# Patient Record
Sex: Female | Born: 1955
Health system: Southern US, Community
[De-identification: ages and names within clinical notes are randomized; demographics above are authoritative.]

## PROBLEM LIST (undated history)

## (undated) DIAGNOSIS — J189 Pneumonia, unspecified organism: Secondary | ICD-10-CM

## (undated) DIAGNOSIS — R0789 Other chest pain: Secondary | ICD-10-CM

## (undated) DIAGNOSIS — K30 Functional dyspepsia: Secondary | ICD-10-CM

## (undated) DIAGNOSIS — D899 Disorder involving the immune mechanism, unspecified: Secondary | ICD-10-CM

## (undated) DIAGNOSIS — K5190 Ulcerative colitis, unspecified, without complications: Secondary | ICD-10-CM

## (undated) DIAGNOSIS — R0600 Dyspnea, unspecified: Secondary | ICD-10-CM

## (undated) DIAGNOSIS — G43909 Migraine, unspecified, not intractable, without status migrainosus: Secondary | ICD-10-CM

## (undated) DIAGNOSIS — I209 Angina pectoris, unspecified: Secondary | ICD-10-CM

## (undated) DIAGNOSIS — M1990 Unspecified osteoarthritis, unspecified site: Secondary | ICD-10-CM

## (undated) DIAGNOSIS — R7303 Prediabetes: Secondary | ICD-10-CM

## (undated) DIAGNOSIS — R0609 Other forms of dyspnea: Secondary | ICD-10-CM

## (undated) DIAGNOSIS — D649 Anemia, unspecified: Secondary | ICD-10-CM

## (undated) DIAGNOSIS — K279 Peptic ulcer, site unspecified, unspecified as acute or chronic, without hemorrhage or perforation: Secondary | ICD-10-CM

## (undated) DIAGNOSIS — E7800 Pure hypercholesterolemia, unspecified: Secondary | ICD-10-CM

## (undated) DIAGNOSIS — M8588 Other specified disorders of bone density and structure, other site: Secondary | ICD-10-CM

## (undated) DIAGNOSIS — C4490 Unspecified malignant neoplasm of skin, unspecified: Secondary | ICD-10-CM

## (undated) DIAGNOSIS — I10 Essential (primary) hypertension: Secondary | ICD-10-CM

## (undated) DIAGNOSIS — K621 Rectal polyp: Secondary | ICD-10-CM

## (undated) HISTORY — DX: Other specified disorders of bone density and structure, other site: M85.88

## (undated) HISTORY — DX: Disorder involving the immune mechanism, unspecified: D89.9

## (undated) HISTORY — DX: Unspecified osteoarthritis, unspecified site: M19.90

## (undated) HISTORY — DX: Dyspnea, unspecified: R06.00

## (undated) HISTORY — PX: ESOPHAGOGASTRODUODENOSCOPY: SHX1529

## (undated) HISTORY — DX: Other chest pain: R07.89

## (undated) HISTORY — PX: CARDIAC CATHETERIZATION: SHX172

## (undated) HISTORY — DX: Pneumonia, unspecified organism: J18.9

## (undated) HISTORY — PX: COLONOSCOPY: SHX174

## (undated) HISTORY — DX: Functional dyspepsia: K30

## (undated) HISTORY — DX: Other forms of dyspnea: R06.09

## (undated) HISTORY — PX: OTHER SURGICAL HISTORY: SHX169

## (undated) HISTORY — PX: ANKLE FRACTURE SURGERY: SHX122

## (undated) HISTORY — PX: TUBAL LIGATION: SHX77

## (undated) HISTORY — DX: Peptic ulcer, site unspecified, unspecified as acute or chronic, without hemorrhage or perforation: K27.9

---

## 2008-10-26 ENCOUNTER — Emergency Department (HOSPITAL_BASED_OUTPATIENT_CLINIC_OR_DEPARTMENT_OTHER): Admission: EM | Admit: 2008-10-26 | Discharge: 2008-10-26 | Payer: Self-pay | Admitting: Emergency Medicine

## 2008-10-26 ENCOUNTER — Ambulatory Visit: Payer: Self-pay | Admitting: Radiology

## 2008-10-29 ENCOUNTER — Encounter (HOSPITAL_COMMUNITY): Admission: RE | Admit: 2008-10-29 | Discharge: 2009-01-27 | Payer: Self-pay | Admitting: Emergency Medicine

## 2008-12-11 ENCOUNTER — Emergency Department (HOSPITAL_BASED_OUTPATIENT_CLINIC_OR_DEPARTMENT_OTHER): Admission: EM | Admit: 2008-12-11 | Discharge: 2008-12-11 | Payer: Self-pay | Admitting: Emergency Medicine

## 2009-09-15 ENCOUNTER — Emergency Department (HOSPITAL_BASED_OUTPATIENT_CLINIC_OR_DEPARTMENT_OTHER): Admission: EM | Admit: 2009-09-15 | Discharge: 2009-09-15 | Payer: Self-pay | Admitting: Emergency Medicine

## 2009-09-15 ENCOUNTER — Ambulatory Visit: Payer: Self-pay | Admitting: Diagnostic Radiology

## 2010-10-11 ENCOUNTER — Encounter: Payer: Self-pay | Admitting: Family Medicine

## 2011-01-01 LAB — DIFFERENTIAL
Basophils Absolute: 0 10*3/uL (ref 0.0–0.1)
Basophils Relative: 0 % (ref 0–1)
Eosinophils Absolute: 0 10*3/uL (ref 0.0–0.7)
Eosinophils Relative: 0 % (ref 0–5)
Lymphocytes Relative: 9 % — ABNORMAL LOW (ref 12–46)
Lymphs Abs: 0.7 10*3/uL (ref 0.7–4.0)
Monocytes Absolute: 0.5 10*3/uL (ref 0.1–1.0)
Monocytes Relative: 6 % (ref 3–12)
Neutro Abs: 7.1 10*3/uL (ref 1.7–7.7)
Neutrophils Relative %: 85 % — ABNORMAL HIGH (ref 43–77)

## 2011-01-01 LAB — CBC
HCT: 37.3 % (ref 36.0–46.0)
Hemoglobin: 12.4 g/dL (ref 12.0–15.0)
MCHC: 33.3 g/dL (ref 30.0–36.0)
MCV: 87.2 fL (ref 78.0–100.0)
Platelets: 250 10*3/uL (ref 150–400)
RBC: 4.28 MIL/uL (ref 3.87–5.11)
RDW: 13.3 % (ref 11.5–15.5)
WBC: 8.3 10*3/uL (ref 4.0–10.5)

## 2011-01-01 LAB — URINALYSIS, ROUTINE W REFLEX MICROSCOPIC
Bilirubin Urine: NEGATIVE
Glucose, UA: NEGATIVE mg/dL
Hgb urine dipstick: NEGATIVE
Ketones, ur: 15 mg/dL — AB
Nitrite: NEGATIVE
Protein, ur: NEGATIVE mg/dL
Specific Gravity, Urine: 1.013 (ref 1.005–1.030)
Urobilinogen, UA: 0.2 mg/dL (ref 0.0–1.0)
pH: 7.5 (ref 5.0–8.0)

## 2011-01-01 LAB — BASIC METABOLIC PANEL
BUN: 11 mg/dL (ref 6–23)
CO2: 28 mEq/L (ref 19–32)
Calcium: 9.4 mg/dL (ref 8.4–10.5)
Chloride: 107 mEq/L (ref 96–112)
Creatinine, Ser: 0.7 mg/dL (ref 0.4–1.2)
GFR calc Af Amer: >60 mL/min (ref >60)
GFR calc non Af Amer: >60 mL/min (ref >60)
Glucose, Bld: 101 mg/dL — ABNORMAL HIGH (ref 70–99)
Potassium: 3.2 mEq/L — ABNORMAL LOW (ref 3.5–5.1)
Sodium: 145 mEq/L (ref 135–145)

## 2016-05-24 ENCOUNTER — Encounter (HOSPITAL_BASED_OUTPATIENT_CLINIC_OR_DEPARTMENT_OTHER): Payer: Self-pay | Admitting: Emergency Medicine

## 2016-05-24 ENCOUNTER — Emergency Department (HOSPITAL_BASED_OUTPATIENT_CLINIC_OR_DEPARTMENT_OTHER)
Admission: EM | Admit: 2016-05-24 | Discharge: 2016-05-24 | Disposition: A | Discharge status: Home / Self Care | Payer: 59 | Attending: Emergency Medicine | Admitting: Emergency Medicine

## 2016-05-24 DIAGNOSIS — H53149 Visual discomfort, unspecified: Secondary | ICD-10-CM | POA: Diagnosis not present

## 2016-05-24 DIAGNOSIS — R112 Nausea with vomiting, unspecified: Secondary | ICD-10-CM | POA: Insufficient documentation

## 2016-05-24 DIAGNOSIS — R519 Headache, unspecified: Secondary | ICD-10-CM

## 2016-05-24 DIAGNOSIS — R51 Headache: Secondary | ICD-10-CM | POA: Diagnosis present

## 2016-05-24 DIAGNOSIS — I10 Essential (primary) hypertension: Secondary | ICD-10-CM | POA: Diagnosis not present

## 2016-05-24 HISTORY — DX: Essential (primary) hypertension: I10

## 2016-05-24 HISTORY — DX: Ulcerative colitis, unspecified, without complications: K51.90

## 2016-05-24 HISTORY — DX: Pure hypercholesterolemia, unspecified: E78.00

## 2016-05-24 HISTORY — DX: Migraine, unspecified, not intractable, without status migrainosus: G43.909

## 2016-05-24 LAB — BASIC METABOLIC PANEL
Anion gap: 8 (ref 5–15)
BUN: 16 mg/dL (ref 6–20)
CO2: 21 mmol/L — ABNORMAL LOW (ref 22–32)
Calcium: 9.2 mg/dL (ref 8.9–10.3)
Chloride: 108 mmol/L (ref 101–111)
Creatinine, Ser: 0.77 mg/dL (ref 0.44–1.00)
GFR calc Af Amer: >60 mL/min (ref >60)
GFR calc non Af Amer: >60 mL/min (ref >60)
Glucose, Bld: 114 mg/dL — ABNORMAL HIGH (ref 65–99)
Potassium: 3.6 mmol/L (ref 3.5–5.1)
Sodium: 137 mmol/L (ref 135–145)

## 2016-05-24 LAB — CBC WITH DIFFERENTIAL/PLATELET
Basophils Absolute: 0 10*3/uL (ref 0.0–0.1)
Basophils Relative: 0 %
Eosinophils Absolute: 0 10*3/uL (ref 0.0–0.7)
Eosinophils Relative: 0 %
HCT: 34.3 % — ABNORMAL LOW (ref 36.0–46.0)
Hemoglobin: 11.8 g/dL — ABNORMAL LOW (ref 12.0–15.0)
Lymphocytes Relative: 17 %
Lymphs Abs: 1.7 10*3/uL (ref 0.7–4.0)
MCH: 29 pg (ref 26.0–34.0)
MCHC: 34.4 g/dL (ref 30.0–36.0)
MCV: 84.3 fL (ref 78.0–100.0)
Monocytes Absolute: 0.7 10*3/uL (ref 0.1–1.0)
Monocytes Relative: 7 %
Neutro Abs: 7.7 10*3/uL (ref 1.7–7.7)
Neutrophils Relative %: 76 %
Platelets: 249 10*3/uL (ref 150–400)
RBC: 4.07 MIL/uL (ref 3.87–5.11)
RDW: 13.6 % (ref 11.5–15.5)
WBC: 10.2 10*3/uL (ref 4.0–10.5)

## 2016-05-24 MED ORDER — DIPHENHYDRAMINE HCL 50 MG/ML IJ SOLN
12.5000 mg | Freq: Once | INTRAMUSCULAR | Status: AC
  Administered 2016-05-24: 12.5 mg via INTRAVENOUS
  Filled 2016-05-24: qty 1

## 2016-05-24 MED ORDER — METOCLOPRAMIDE HCL 5 MG/ML IJ SOLN
10.0000 mg | Freq: Once | INTRAMUSCULAR | Status: AC
  Administered 2016-05-24: 10 mg via INTRAVENOUS
  Filled 2016-05-24: qty 2

## 2016-05-24 MED ORDER — KETOROLAC TROMETHAMINE 30 MG/ML IJ SOLN
30.0000 mg | Freq: Once | INTRAMUSCULAR | Status: AC
  Administered 2016-05-24: 30 mg via INTRAVENOUS
  Filled 2016-05-24: qty 1

## 2016-05-24 MED ORDER — SODIUM CHLORIDE 0.9 % IV BOLUS (SEPSIS)
1000.0000 mL | Freq: Once | INTRAVENOUS | Status: AC
  Administered 2016-05-24: 1000 mL via INTRAVENOUS

## 2016-05-24 MED ORDER — DEXAMETHASONE SODIUM PHOSPHATE 10 MG/ML IJ SOLN
10.0000 mg | Freq: Once | INTRAMUSCULAR | Status: AC
  Administered 2016-05-24: 10 mg via INTRAVENOUS
  Filled 2016-05-24: qty 1

## 2016-05-24 NOTE — ED Triage Notes (Signed)
Patient states that she is having 10/10 pain with typical Migraine Headache pains

## 2016-05-24 NOTE — ED Provider Notes (Signed)
[facility] Provider Note   CSN: [id number] Arrival date & time: 05/24/16  1810  By signing my name below, I, Andrea [name], attest that this documentation has been prepared under the direction and in the presence of Gloriann Loan, PA-C. Electronically Signed: Irene [name], ED Scribe. 05/24/16. 7:02 PM.  History   Chief Complaint Chief Complaint  Patient presents with  . Migraine   The history is provided by the patient. No language interpreter was used.   HPI Comments: Andrea [name] is a 60 y.o. Female with a hx of migraine and HTN who presents to the Emergency Department complaining of a gradually worsening frontal and left temporal headache that she rates 10/10 onset earlier this evening, about 4-5 hours ago. She reports associated photophobia, phonophobia, nausea, and vomiting x1. Pt states that her current pain is typical of her migraines. Pt took 2 Tylenol for her symptoms with no relief. She denies fever, cough, neck pain, neck stiffness, gait abnormalities, dizziness, facial droop, lightheadedness, numbness, speech difficulty, or weakness. No head injury/trauma.   Past Medical History:  Diagnosis Date  . Hypercholesterolemia   . Hypertension   . Migraine   . Ulcerative colitis ([place])     There are no active problems to display for this patient.   Past Surgical History:  Procedure Laterality Date  . hip replacement      OB History    No data available     Home Medications    Prior to Admission medications   Not on File    Family History History reviewed. No pertinent family history.  Social History Social History  Substance Use Topics  . Smoking status: Never Smoker  . Smokeless tobacco: Never Used  . Alcohol use No     Allergies   Codeine  Review of Systems Review of Systems  Constitutional: Negative for fever.  Eyes: Positive for photophobia.  Respiratory: Negative for cough.   Gastrointestinal: Positive for nausea and vomiting.    Musculoskeletal: Negative for gait problem, neck pain and neck stiffness.  Neurological: Positive for headaches. Negative for dizziness, facial asymmetry, speech difficulty, weakness, light-headedness and numbness.   Physical Exam Updated Vital Signs BP 149/67 (BP Location: Right Arm)   Pulse 84   Temp 98 F (36.7 C) (Oral)   Resp 18   Ht 5\' 5"  (1.651 m)   Wt 83.5 kg   SpO2 98%   BMI 30.62 kg/m   Physical Exam  Constitutional: She is oriented to person, place, and time. She appears well-developed and well-nourished.  Non-toxic appearance. She does not have a sickly appearance. She does not appear ill.  HENT:  Head: Normocephalic and atraumatic.  Mouth/Throat: Oropharynx is clear and moist.  Eyes: Conjunctivae are normal.  Neck: Normal range of motion. Neck supple.  No nuchal rigidity or meningismus  Cardiovascular: Normal rate and regular rhythm.   Pulmonary/Chest: Effort normal and breath sounds normal. No accessory muscle usage or stridor. No respiratory distress. She has no wheezes. She has no rhonchi. She has no rales.  Abdominal: Soft. Bowel sounds are normal. She exhibits no distension. There is no tenderness.  Musculoskeletal: Normal range of motion.  Lymphadenopathy:    She has no cervical adenopathy.  Neurological: She is alert and oriented to person, place, and time.  Mental Status:   AOx3.  Speech clear without dysarthria. Cranial Nerves:  I-not tested  II-PERRLA  III, IV, VI-EOMs intact  V-temporal and masseter strength intact  VII-symmetrical facial movements intact, no facial droop  VIII-hearing grossly intact bilaterally  IX, X-gag intact  XI-strength of sternomastoid and trapezius muscles 5/5  XII-tongue midline Motor:   Good muscle bulk and tone  Strength 5/5 bilaterally in upper and lower extremities   Cerebellar--intact RAMs, finger to nose intact bilaterally.  Gait normal  No pronator drift Sensory:  Intact in upper and lower extremities    Skin: Skin is warm and dry.  Psychiatric: She has a normal mood and affect. Her behavior is normal.     ED Treatments / Results  DIAGNOSTIC STUDIES: Oxygen Saturation is 100% on RA, normal by my interpretation.    COORDINATION OF CARE: 7:00 PM-Discussed treatment plan which includes labs, IV fluids, and migraine cocktail with pt at bedside and pt agreed to plan.    Labs (all labs ordered are listed, but only abnormal results are displayed) Labs Reviewed  CBC WITH DIFFERENTIAL/PLATELET - Abnormal; Notable for the following:       Result Value   Hemoglobin 11.8 (*)    HCT 34.3 (*)    All other components within normal limits  BASIC METABOLIC PANEL - Abnormal; Notable for the following:    CO2 21 (*)    Glucose, Bld 114 (*)    All other components within normal limits    EKG  EKG Interpretation None       Radiology No results found.  Procedures Procedures (including critical care time)  Medications Ordered in ED Medications  sodium chloride 0.9 % bolus 1,000 mL (0 mLs Intravenous Stopped 05/24/16 2042)  ketorolac (TORADOL) 30 MG/ML injection 30 mg (30 mg Intravenous Given 05/24/16 1943)  metoCLOPramide (REGLAN) injection 10 mg (10 mg Intravenous Given 05/24/16 1943)  dexamethasone (DECADRON) injection 10 mg (10 mg Intravenous Given 05/24/16 1943)  diphenhydrAMINE (BENADRYL) injection 12.5 mg (12.5 mg Intravenous Given 05/24/16 1944)     Initial Impression / Assessment and Plan / ED Course  I have reviewed the triage vital signs and the nursing notes.  Pertinent labs & imaging results that were available during my care of the patient were reviewed by me and considered in my medical decision making (see chart for details).  Clinical Course   Typical migraine headache for the pt. Non focal neuro exam. No recent head trauma. No fever. Doubt meningitis. Doubt intracranial bleed. Doubt normal pressure hydrocephalus. No indication for imaging. Will treat with migraine cocktail  and reevaluate.  Patient with improvement.  Labs without acute abnormalities. Follow up PCP.  Stable for discharge.   I personally performed the services described in this documentation, which was scribed in my presence. The recorded information has been reviewed and is accurate.    Final Clinical Impressions(s) / ED Diagnoses   Final diagnoses:  Nonintractable headache, unspecified chronicity pattern, unspecified headache type     New Prescriptions New Prescriptions   No medications on file     Gloriann Loan, PA-C 05/24/16 2056    Sherwood Gambler, MD 06/02/16 [phone]

## 2017-10-25 DIAGNOSIS — K5190 Ulcerative colitis, unspecified, without complications: Secondary | ICD-10-CM | POA: Diagnosis not present

## 2017-11-26 ENCOUNTER — Encounter (HOSPITAL_COMMUNITY): Payer: Self-pay | Admitting: Emergency Medicine

## 2017-11-26 ENCOUNTER — Other Ambulatory Visit: Payer: Self-pay

## 2017-11-26 ENCOUNTER — Ambulatory Visit (HOSPITAL_COMMUNITY)
Admission: EM | Admit: 2017-11-26 | Discharge: 2017-11-26 | Disposition: A | Discharge status: Home / Self Care | Payer: 59 | Attending: Internal Medicine | Admitting: Internal Medicine

## 2017-11-26 DIAGNOSIS — J0110 Acute frontal sinusitis, unspecified: Secondary | ICD-10-CM | POA: Diagnosis not present

## 2017-11-26 MED ORDER — AMOXICILLIN-POT CLAVULANATE 875-125 MG PO TABS: 1.0000 | ORAL_TABLET | Freq: Two times a day (BID) | ORAL | 0 refills | Status: DC

## 2017-11-26 MED ORDER — FLUTICASONE PROPIONATE 50 MCG/ACT NA SUSP: 2.0000 | Freq: Every day | NASAL | 0 refills | Status: DC

## 2017-11-26 MED ORDER — IPRATROPIUM BROMIDE 0.06 % NA SOLN: 2.0000 | Freq: Four times a day (QID) | NASAL | 0 refills | Status: DC

## 2017-11-26 MED FILL — IPRATROPIUM 0.06% SPRAY: 0.06 | 10 days supply | Qty: 15 | Fill #0

## 2017-11-26 MED FILL — FLUTICASONE PROP 50 MCG SPR: 50 | 30 days supply | Qty: 16 | Fill #0

## 2017-11-26 NOTE — Discharge Instructions (Signed)
Start flonase, atrovent nasal spray for nasal congestion/drainage. You can use over the counter nasal saline rinse such as neti pot for nasal congestion. Keep hydrated, your urine should be clear to pale yellow in color. Tylenol/motrin for fever and pain. Monitor for any worsening of symptoms, chest pain, shortness of breath, wheezing, swelling of the throat, follow up for reevaluation.   If symptoms persists, can fill Augmentin on 3/12 for bacterial sinus infection.  For sore throat try using a honey-based tea. Use 3 teaspoons of honey with juice squeezed from half lemon. Place shaved pieces of ginger into 1/2-1 cup of water and warm over stove top. Then mix the ingredients and repeat every 4 hours as needed.

## 2017-11-26 NOTE — ED Provider Notes (Signed)
[facility]    CSN: [id number] Arrival date & time: 11/26/17  1408     History   Chief Complaint Chief Complaint  Patient presents with  . Sore Throat    HPI Andrea [name] is a 62 y.o. female.   62 year old female with history of HLD, HTN, UC comes in for 1 week history of URI symptoms.  States  has had sore throat, rhinorrhea.  Minimal cough.  Denies fever, chills, night sweats.  OTC cough drops with some relief.  Never smoker.  Positive sick contact.      Past Medical History:  Diagnosis Date  . Hypercholesterolemia   . Hypertension   . Migraine   . Ulcerative colitis ([place])     There are no active problems to display for this patient.   Past Surgical History:  Procedure Laterality Date  . hip replacement      OB History    No data available       Home Medications    Prior to Admission medications   Medication Sig Start Date End Date Taking? Authorizing Provider  atorvastatin (LIPITOR) 20 MG tablet Take 20 mg by mouth daily.   Yes [provider]  azaTHIOprine (IMURAN) 50 MG tablet Take 50 mg by mouth daily.   Yes [provider]  inFLIXimab (REMICADE) 100 MG injection Inject into the vein.   Yes [provider]  PARoxetine (PAXIL) 10 MG tablet Take 5 mg by mouth daily.   Yes [provider]  amoxicillin-clavulanate (AUGMENTIN) 875-125 MG tablet Take 1 tablet by mouth every 12 (twelve) hours. Can fill on 3/12 if symptoms not improving 11/30/17   Tasia Catchings, Amy V, PA-C  fluticasone (FLONASE) 50 MCG/ACT nasal spray Place 2 sprays into both nostrils daily. 11/26/17   Tasia Catchings, Amy V, PA-C  ipratropium (ATROVENT) 0.06 % nasal spray Place 2 sprays into both nostrils 4 (four) times daily. 11/26/17   Ok Edwards, PA-C    Family History History reviewed. No pertinent family history.  Social History Social History   Tobacco Use  . Smoking status: Never Smoker  . Smokeless tobacco: Never Used  Substance Use Topics  . Alcohol use:  No  . Drug use: No     Allergies   Codeine   Review of Systems Review of Systems  Reason unable to perform ROS: See HPI as above.     Physical Exam Triage Vital Signs ED Triage Vitals  Enc Vitals Group     BP 11/26/17 1445 (!) 155/80     Pulse Rate 11/26/17 1445 77     Resp 11/26/17 1445 18     Temp 11/26/17 1445 98.9 F (37.2 C)     Temp Source 11/26/17 1445 Oral     SpO2 11/26/17 1445 99 %     Weight --      Height --      Head Circumference --      Peak Flow --      Pain Score 11/26/17 1441 4     Pain Loc --      Pain Edu? --      Excl. in [place]? --    No data found.  Updated Vital Signs BP (!) 155/80 (BP Location: Left Arm)   Pulse 77   Temp 98.9 F (37.2 C) (Oral)   Resp 18   SpO2 99%   Physical Exam  Constitutional: She is oriented to person, place, and time. She appears well-developed and well-nourished. No distress.  HENT:  Head: Normocephalic and atraumatic.  Right Ear: Tympanic membrane, external ear and ear canal normal. Tympanic membrane is not erythematous and not bulging.  Left Ear: Tympanic membrane, external ear and ear canal normal. Tympanic membrane is not erythematous and not bulging.  Nose: Right sinus exhibits frontal sinus tenderness. Right sinus exhibits no maxillary sinus tenderness. Left sinus exhibits frontal sinus tenderness. Left sinus exhibits no maxillary sinus tenderness.  Mouth/Throat: Uvula is midline, oropharynx is clear and moist and mucous membranes are normal. No tonsillar exudate.  Eyes: Conjunctivae are normal. Pupils are equal, round, and reactive to light.  Neck: Normal range of motion. Neck supple.  Cardiovascular: Normal rate, regular rhythm and normal heart sounds. Exam reveals no gallop and no friction rub.  No murmur heard. Pulmonary/Chest: Effort normal and breath sounds normal. She has no decreased breath sounds. She has no wheezes. She has no rhonchi. She has no rales.  Lymphadenopathy:    She has no cervical  adenopathy.  Neurological: She is alert and oriented to person, place, and time.  Skin: Skin is warm and dry.  Psychiatric: She has a normal mood and affect. Her behavior is normal. Judgment normal.     UC Treatments / Results  Labs (all labs ordered are listed, but only abnormal results are displayed) Labs Reviewed - No data to display  EKG  EKG Interpretation None       Radiology No results found.  Procedures Procedures (including critical care time)  Medications Ordered in UC Medications - No data to display   Initial Impression / Assessment and Plan / UC Course  I have reviewed the triage vital signs and the nursing notes.  Pertinent labs & imaging results that were available during my care of the patient were reviewed by me and considered in my medical decision making (see chart for details).    Discussed with patient history and exam most consistent with viral URI. Symptomatic treatment as needed. Push fluids.  Rx of Augmentin sent to pharmacy, patient can fill in 3 days if symptoms not improving for sinusitis. Return precautions given.    Final Clinical Impressions(s) / UC Diagnoses   Final diagnoses:  Acute non-recurrent frontal sinusitis    ED Discharge Orders        Ordered    amoxicillin-clavulanate (AUGMENTIN) 875-125 MG tablet  Every 12 hours     11/26/17 1535    fluticasone (FLONASE) 50 MCG/ACT nasal spray  Daily     11/26/17 1535    ipratropium (ATROVENT) 0.06 % nasal spray  4 times daily     11/26/17 [address], [place] 11/26/17 1538

## 2017-11-26 NOTE — ED Triage Notes (Signed)
The patient presented to the [facility] with a complaint of a sore throat x 1 week. The patient denied any fever in the lat 48 hours.

## 2017-12-03 MED FILL — AMOXICILLIN-POT CLAVULANATE: 875-125 | 7 days supply | Qty: 14 | Fill #0

## 2017-12-20 DIAGNOSIS — K5190 Ulcerative colitis, unspecified, without complications: Secondary | ICD-10-CM | POA: Diagnosis not present

## 2017-12-21 MED FILL — ATORVASTATIN 10 MG TABLET: 10 | 90 days supply | Qty: 90 | Fill #0

## 2017-12-21 MED FILL — azaTHIOprine 50 MG TABS: 50 | 90 days supply | Qty: 180 | Fill #0

## 2017-12-21 MED FILL — TRIAMTERENE/HCTZ 37.5/25 CP: 37.5-25 | 90 days supply | Qty: 90 | Fill #0

## 2017-12-27 MED FILL — SF 5000 PLUS CREAM: 1.1 | 30 days supply | Qty: 51 | Fill #0

## 2018-02-10 ENCOUNTER — Telehealth: Payer: 59 | Admitting: Family

## 2018-02-10 DIAGNOSIS — J329 Chronic sinusitis, unspecified: Secondary | ICD-10-CM

## 2018-02-10 DIAGNOSIS — B9689 Other specified bacterial agents as the cause of diseases classified elsewhere: Secondary | ICD-10-CM

## 2018-02-10 MED ORDER — AMOXICILLIN-POT CLAVULANATE 875-125 MG PO TABS: 1.0000 | ORAL_TABLET | Freq: Two times a day (BID) | ORAL | 0 refills | Status: AC

## 2018-02-10 MED FILL — AMOX-CLAV 875-125 MG TABLET: 875-125 | 7 days supply | Qty: 14 | Fill #0

## 2018-02-10 NOTE — Progress Notes (Signed)
Thank you for the details you included in the comment boxes. Those details are very helpful in determining the best course of treatment for you and help us to provide the best care.  We are sorry that you are not feeling well.  Here is how we plan to help!  Based on what you have shared with me it looks like you have sinusitis.  Sinusitis is inflammation and infection in the sinus cavities of the head.  Based on your presentation I believe you most likely have Acute Bacterial Sinusitis.  This is an infection caused by bacteria and is treated with antibiotics. I have prescribed Augmentin 875mg/125mg one tablet twice daily with food, for 7 days. You may use an oral decongestant such as Mucinex D or if you have glaucoma or high blood pressure use plain Mucinex. Saline nasal spray help and can safely be used as often as needed for congestion.  If you develop worsening sinus pain, fever or notice severe headache and vision changes, or if symptoms are not better after completion of antibiotic, please schedule an appointment with a health care provider.    Sinus infections are not as easily transmitted as other respiratory infection, however we still recommend that you avoid close contact with loved ones, especially the very young and elderly.  Remember to wash your hands thoroughly throughout the day as this is the number one way to prevent the spread of infection!  Home Care:  Only take medications as instructed by your medical team.  Complete the entire course of an antibiotic.  Do not take these medications with alcohol.  A steam or ultrasonic humidifier can help congestion.  You can place a towel over your head and breathe in the steam from hot water coming from a faucet.  Avoid close contacts especially the very young and the elderly.  Cover your mouth when you cough or sneeze.  Always remember to wash your hands.  Get Help Right Away If:  You develop worsening fever or sinus pain.  You  develop a severe head ache or visual changes.  Your symptoms persist after you have completed your treatment plan.  Make sure you  Understand these instructions.  Will watch your condition.  Will get help right away if you are not doing well or get worse.  Your e-visit answers were reviewed by a board certified advanced clinical practitioner to complete your personal care plan.  Depending on the condition, your plan could have included both over the counter or prescription medications.  If there is a problem please reply  once you have received a response from your provider.  Your safety is important to us.  If you have drug allergies check your prescription carefully.    You can use MyChart to ask questions about today's visit, request a non-urgent call back, or ask for a work or school excuse for 24 hours related to this e-Visit. If it has been greater than 24 hours you will need to follow up with your provider, or enter a new e-Visit to address those concerns.  You will get an e-mail in the next two days asking about your experience.  I hope that your e-visit has been valuable and will speed your recovery. Thank you for using e-visits.    

## 2018-02-15 DIAGNOSIS — K5190 Ulcerative colitis, unspecified, without complications: Secondary | ICD-10-CM | POA: Diagnosis not present

## 2018-02-21 ENCOUNTER — Telehealth: Payer: 59 | Admitting: Family

## 2018-02-21 DIAGNOSIS — B009 Herpesviral infection, unspecified: Secondary | ICD-10-CM | POA: Diagnosis not present

## 2018-02-21 MED ORDER — VALACYCLOVIR HCL 1 G PO TABS: 2000.0000 mg | ORAL_TABLET | Freq: Two times a day (BID) | ORAL | 0 refills | Status: DC

## 2018-02-21 MED FILL — valACYclovir HCL 1 GM TABS: 1 | 5 days supply | Qty: 20 | Fill #0

## 2018-02-21 NOTE — Progress Notes (Signed)
Thank you for the details you included in the comment boxes. Those details are very helpful in determining the best course of treatment for you and help us to provide the best care.  We are sorry that you are not feeling well.  Here is how we plan to help!  Based on what you have shared with me it does look like you have a viral infection.    Most cold sores or fever blisters are small fluid filled blisters around the mouth caused by herpes simplex virus.  The most common strain of the virus causing cold sores is herpes simplex virus 1.  It can be spread by skin contact, sharing eating utensils, or even sharing towels.  Cold sores are contagious to other people until dry. (Approximately 5-7 days).  Wash your hands. You can spread the virus to your eyes through handling your contact lenses after touching the lesions.  Most people experience pain at the sight or tingling sensations in their lips that may begin before the ulcers erupt.  Herpes simplex is treatable but not curable.  It may lie dormant for a long time and then reappear due to stress or prolonged sun exposure.  Many patients have success in treating their cold sores with an over the counter topical called Abreva.  You may apply the cream up to 5 times daily (maximum 10 days) until healing occurs.  If you would like to use an oral antiviral medication to speed the healing of your cold sore, I have sent a prescription to your local pharmacy Valacyclovir 2 gm twice daily for 1 day    HOME CARE:   Wash your hands frequently.  Do not pick at or rub the sore.  Don't open the blisters.  Avoid kissing other people during this time.  Avoid sharing drinking glasses, eating utensils, or razors.  Do not handle contact lenses unless you have thoroughly washed your hands with soap and warm water!  Avoid oral sex during this time.  Herpes from sores on your mouth can spread to your partner's genital area.  Avoid contact with anyone who has  eczema or a weakened immune system.  Cold sores are often triggered by exposure to intense sunlight, use a lip balm containing a sunscreen (SPF 30 or higher).  GET HELP RIGHT AWAY IF:   Blisters look infected.  Blisters occur near or in the eye.  Symptoms last longer than 10 days.  Your symptoms become worse.  MAKE SURE YOU:   Understand these instructions.  Will watch your condition.  Will get help right away if you are not doing well or get worse.    Your e-visit answers were reviewed by a board certified advanced clinical practitioner to complete your personal care plan.  Depending upon the condition, your plan could have  Included both over the counter or prescription medications.    Please review your pharmacy choice.  Be sure that the pharmacy you have chosen is open so that you can pick up your prescription now.  If there is a problem you csn message your provider in MyChart to have the prescription routed to another pharmacy.    Your safety is important to us.  If you have drug allergies check our prescription carefully.  For the next 24 hours you can use MyChart to ask questions about today's visit, request a non-urgent call back, or ask for a work or school excuse from your e-visit provider.  You will get an email in the   the next two days asking about your experience.  I hope that your e-visit has been valuable and will speed your recovery.  

## 2018-03-09 DIAGNOSIS — K5190 Ulcerative colitis, unspecified, without complications: Secondary | ICD-10-CM | POA: Diagnosis not present

## 2018-03-09 DIAGNOSIS — M79642 Pain in left hand: Secondary | ICD-10-CM | POA: Diagnosis not present

## 2018-03-09 DIAGNOSIS — I10 Essential (primary) hypertension: Secondary | ICD-10-CM | POA: Diagnosis not present

## 2018-03-09 DIAGNOSIS — M79641 Pain in right hand: Secondary | ICD-10-CM | POA: Diagnosis not present

## 2018-03-09 DIAGNOSIS — G43009 Migraine without aura, not intractable, without status migrainosus: Secondary | ICD-10-CM | POA: Diagnosis not present

## 2018-03-09 DIAGNOSIS — E782 Mixed hyperlipidemia: Secondary | ICD-10-CM | POA: Diagnosis not present

## 2018-03-14 ENCOUNTER — Other Ambulatory Visit: Payer: Self-pay | Admitting: Family Medicine

## 2018-03-14 ENCOUNTER — Other Ambulatory Visit (HOSPITAL_COMMUNITY)
Admission: RE | Admit: 2018-03-14 | Discharge: 2018-03-14 | Disposition: A | Discharge status: Home / Self Care | Payer: 59 | Source: Ambulatory Visit | Attending: Family Medicine | Admitting: Family Medicine

## 2018-03-14 DIAGNOSIS — M79641 Pain in right hand: Secondary | ICD-10-CM | POA: Diagnosis not present

## 2018-03-14 DIAGNOSIS — Z1239 Encounter for other screening for malignant neoplasm of breast: Secondary | ICD-10-CM | POA: Diagnosis not present

## 2018-03-14 DIAGNOSIS — Z1159 Encounter for screening for other viral diseases: Secondary | ICD-10-CM | POA: Diagnosis not present

## 2018-03-14 DIAGNOSIS — G43009 Migraine without aura, not intractable, without status migrainosus: Secondary | ICD-10-CM | POA: Diagnosis not present

## 2018-03-14 DIAGNOSIS — K5190 Ulcerative colitis, unspecified, without complications: Secondary | ICD-10-CM | POA: Diagnosis not present

## 2018-03-14 DIAGNOSIS — Z0000 Encounter for general adult medical examination without abnormal findings: Secondary | ICD-10-CM | POA: Diagnosis not present

## 2018-03-14 DIAGNOSIS — Z01411 Encounter for gynecological examination (general) (routine) with abnormal findings: Secondary | ICD-10-CM | POA: Insufficient documentation

## 2018-03-14 DIAGNOSIS — Z1231 Encounter for screening mammogram for malignant neoplasm of breast: Secondary | ICD-10-CM

## 2018-03-14 DIAGNOSIS — M79642 Pain in left hand: Secondary | ICD-10-CM | POA: Diagnosis not present

## 2018-03-14 DIAGNOSIS — E782 Mixed hyperlipidemia: Secondary | ICD-10-CM | POA: Diagnosis not present

## 2018-03-14 DIAGNOSIS — I10 Essential (primary) hypertension: Secondary | ICD-10-CM | POA: Diagnosis not present

## 2018-03-15 LAB — CYTOLOGY - PAP
Diagnosis: NEGATIVE
HPV: NOT DETECTED

## 2018-04-12 DIAGNOSIS — K5190 Ulcerative colitis, unspecified, without complications: Secondary | ICD-10-CM | POA: Diagnosis not present

## 2018-04-13 ENCOUNTER — Ambulatory Visit
Admission: RE | Admit: 2018-04-13 | Discharge: 2018-04-13 | Disposition: A | Discharge status: Home / Self Care | Payer: 59 | Source: Ambulatory Visit | Attending: Family Medicine | Admitting: Family Medicine

## 2018-04-13 DIAGNOSIS — Z1231 Encounter for screening mammogram for malignant neoplasm of breast: Secondary | ICD-10-CM

## 2018-04-14 ENCOUNTER — Telehealth: Payer: 59 | Admitting: Physician Assistant

## 2018-04-14 DIAGNOSIS — R300 Dysuria: Secondary | ICD-10-CM

## 2018-04-14 MED ORDER — CEPHALEXIN 500 MG PO CAPS: 500.0000 mg | ORAL_CAPSULE | Freq: Two times a day (BID) | ORAL | 0 refills | Status: AC

## 2018-04-14 MED FILL — CEPHALEXIN 500 MG CAPSULE: 500 | 7 days supply | Qty: 14 | Fill #0

## 2018-04-14 NOTE — Progress Notes (Signed)

## 2018-04-15 MED FILL — ATORVASTATIN 10 MG TABLET: 10 | 90 days supply | Qty: 90 | Fill #0

## 2018-04-15 MED FILL — TRIAMTERENE/HCTZ 37.5/25 TB: 37.5-25 | 90 days supply | Qty: 90 | Fill #0

## 2018-04-15 MED FILL — azaTHIOprine 50 MG TABS: 50 | 90 days supply | Qty: 180 | Fill #0

## 2018-04-18 MED FILL — PARoxetine HCL 20 MG TABS: 20 | 90 days supply | Qty: 90 | Fill #0

## 2018-04-19 DIAGNOSIS — M25512 Pain in left shoulder: Secondary | ICD-10-CM | POA: Diagnosis not present

## 2018-04-19 DIAGNOSIS — M19012 Primary osteoarthritis, left shoulder: Secondary | ICD-10-CM | POA: Diagnosis not present

## 2018-04-28 DIAGNOSIS — K5190 Ulcerative colitis, unspecified, without complications: Secondary | ICD-10-CM | POA: Diagnosis not present

## 2018-04-28 DIAGNOSIS — Z6829 Body mass index (BMI) 29.0-29.9, adult: Secondary | ICD-10-CM | POA: Diagnosis not present

## 2018-06-07 DIAGNOSIS — K5190 Ulcerative colitis, unspecified, without complications: Secondary | ICD-10-CM | POA: Diagnosis not present

## 2018-06-20 ENCOUNTER — Emergency Department (HOSPITAL_BASED_OUTPATIENT_CLINIC_OR_DEPARTMENT_OTHER)
Admission: EM | Admit: 2018-06-20 | Discharge: 2018-06-20 | Disposition: A | Discharge status: Home / Self Care | Payer: 59 | Attending: Emergency Medicine | Admitting: Emergency Medicine

## 2018-06-20 ENCOUNTER — Encounter (HOSPITAL_BASED_OUTPATIENT_CLINIC_OR_DEPARTMENT_OTHER): Payer: Self-pay | Admitting: *Deleted

## 2018-06-20 ENCOUNTER — Other Ambulatory Visit: Payer: Self-pay

## 2018-06-20 DIAGNOSIS — Z79899 Other long term (current) drug therapy: Secondary | ICD-10-CM | POA: Diagnosis not present

## 2018-06-20 DIAGNOSIS — G43809 Other migraine, not intractable, without status migrainosus: Secondary | ICD-10-CM | POA: Insufficient documentation

## 2018-06-20 DIAGNOSIS — I10 Essential (primary) hypertension: Secondary | ICD-10-CM | POA: Insufficient documentation

## 2018-06-20 DIAGNOSIS — G43909 Migraine, unspecified, not intractable, without status migrainosus: Secondary | ICD-10-CM | POA: Diagnosis not present

## 2018-06-20 DIAGNOSIS — R51 Headache: Secondary | ICD-10-CM | POA: Diagnosis present

## 2018-06-20 MED ORDER — KETOROLAC TROMETHAMINE 15 MG/ML IJ SOLN
15.0000 mg | Freq: Once | INTRAMUSCULAR | Status: AC
  Administered 2018-06-20: 15 mg via INTRAVENOUS
  Filled 2018-06-20: qty 1

## 2018-06-20 MED ORDER — SODIUM CHLORIDE 0.9 % IV BOLUS
1000.0000 mL | Freq: Once | INTRAVENOUS | Status: AC
  Administered 2018-06-20: 1000 mL via INTRAVENOUS

## 2018-06-20 MED ORDER — METOCLOPRAMIDE HCL 5 MG/ML IJ SOLN
10.0000 mg | Freq: Once | INTRAMUSCULAR | Status: AC
  Administered 2018-06-20: 10 mg via INTRAVENOUS
  Filled 2018-06-20: qty 2

## 2018-06-20 MED ORDER — DIPHENHYDRAMINE HCL 50 MG/ML IJ SOLN
12.5000 mg | Freq: Once | INTRAMUSCULAR | Status: AC
  Administered 2018-06-20: 12.5 mg via INTRAVENOUS
  Filled 2018-06-20: qty 1

## 2018-06-20 NOTE — ED Notes (Addendum)
Pt taken directly to rm 11, pt uncontrollably vomiting , pt incontinent from vomiting  unable to take temp or BP, nurse aware

## 2018-06-20 NOTE — ED Triage Notes (Signed)
Pt c/o migraine x 3 hrs , vomiting in triage

## 2018-06-20 NOTE — ED Provider Notes (Signed)
[name] EMERGENCY DEPARTMENT Provider Note   CSN: [id number] Arrival date & time: 06/20/18  1720     History   Chief Complaint Chief Complaint  Patient presents with  . Migraine    HPI Andrea [name] is a 62 y.o. female.  HPI   62 year old female with headache.  Onset about 3 hours prior to arrival.  Pain is in the left temporal region.  She has a past history of migraines and states that current symptoms feel similar.  Associated with nausea and vomiting.  No fevers or chills.  No acute visual complaints.  No numbness, tingling focal loss of strength.  No neck pain or stiffness.  No blood thinners.  Denies any trauma.  Past Medical History:  Diagnosis Date  . Hypercholesterolemia   . Hypertension   . Migraine   . Ulcerative colitis ([place])     There are no active problems to display for this patient.   Past Surgical History:  Procedure Laterality Date  . hip replacement       OB History   None      Home Medications    Prior to Admission medications   Medication Sig Start Date End Date Taking? Authorizing Provider  acetaminophen (TYLENOL) 500 MG tablet Take 500 mg by mouth every 6 (six) hours as needed.   Yes [provider]  atorvastatin (LIPITOR) 20 MG tablet Take 20 mg by mouth daily.    [provider]  azaTHIOprine (IMURAN) 50 MG tablet Take 50 mg by mouth daily.    [provider]  fluticasone (FLONASE) 50 MCG/ACT nasal spray Place 2 sprays into both nostrils daily. 11/26/17   Tasia Catchings, Amy V, PA-C  inFLIXimab (REMICADE) 100 MG injection Inject into the vein.    [provider]  ipratropium (ATROVENT) 0.06 % nasal spray Place 2 sprays into both nostrils 4 (four) times daily. 11/26/17   Tasia Catchings, Amy V, PA-C  PARoxetine (PAXIL) 10 MG tablet Take 5 mg by mouth daily.    [provider]  valACYclovir (VALTREX) 1000 MG tablet Take 2 tablets (2,000 mg total) by mouth 2 (two) times daily. For 1 day, may repeat in the  future 02/21/18   [place], Elyse Jarvis, FNP    Family History History reviewed. No pertinent family history.  Social History Social History   Tobacco Use  . Smoking status: Never Smoker  . Smokeless tobacco: Never Used  Substance Use Topics  . Alcohol use: No  . Drug use: No     Allergies   Codeine   Review of Systems Review of Systems  All systems reviewed and negative, other than as noted in HPI.  Physical Exam Updated Vital Signs BP (!) 182/87   Pulse 68   Temp 97.6 F (36.4 C)   Resp 20   Ht 5\' 5"  (1.651 m)   Wt 79.4 kg   SpO2 97%   BMI 29.12 kg/m   Physical Exam  Constitutional: She is oriented to person, place, and time. She appears well-developed and well-nourished. No distress.  Laying in bed.  Appears uncomfortable, but nontoxic.  HENT:  Head: Normocephalic and atraumatic.  Eyes: Pupils are equal, round, and reactive to light. Conjunctivae and EOM are normal. Right eye exhibits no discharge. Left eye exhibits no discharge.  Neck: Neck supple.  No nuchal rigidity  Cardiovascular: Normal rate, regular rhythm and normal heart sounds. Exam reveals no gallop and no friction rub.  No murmur heard. Pulmonary/Chest: Effort normal and breath  sounds normal. No respiratory distress.  Abdominal: Soft. She exhibits no distension. There is no tenderness.  Musculoskeletal: She exhibits no edema or tenderness.  Neurological: She is alert and oriented to person, place, and time. No cranial nerve deficit. She exhibits normal muscle tone. Coordination normal.  Is clear.  Content appropriate.  Follows commands.  Cranial nerves II through XII are intact.  Strength is 5 out of 5 bilateral upper lower extremities.  Good finger-nose testing bilaterally.  Skin: Skin is warm and dry.  Psychiatric: She has a normal mood and affect. Her behavior is normal. Thought content normal.  Nursing note and vitals reviewed.    ED Treatments / Results  Labs (all labs ordered are listed, but  only abnormal results are displayed) Labs Reviewed - No data to display  EKG None  Radiology No results found.  Procedures Procedures (including critical care time)  Medications Ordered in ED Medications  sodium chloride 0.9 % bolus 1,000 mL ( Intravenous Stopped 06/20/18 1843)  metoCLOPramide (REGLAN) injection 10 mg (10 mg Intravenous Given 06/20/18 1745)  diphenhydrAMINE (BENADRYL) injection 12.5 mg (12.5 mg Intravenous Given 06/20/18 1745)  ketorolac (TORADOL) 15 MG/ML injection 15 mg (15 mg Intravenous Given 06/20/18 1745)     Initial Impression / Assessment and Plan / ED Course  I have reviewed the triage vital signs and the nursing notes.  Pertinent labs & imaging results that were available during my care of the patient were reviewed by me and considered in my medical decision making (see chart for details).     62 year old female with headache consistent with prior migraines.  Nonfocal neurological examination.  Afebrile.  No nuchal rigidity.  Treated as a migraine with market improvement.  I doubt emergent process.  Final Clinical Impressions(s) / ED Diagnoses   Final diagnoses:  Other migraine without status migrainosus, not intractable    ED Discharge Orders    None       Virgel Manifold, MD 06/20/18 [phone]

## 2018-06-22 MED FILL — BUTALBIT-ACETAMINOPHEN-CAFF: 50-325-40 | 5 days supply | Qty: 30 | Fill #0

## 2018-07-15 DIAGNOSIS — L814 Other melanin hyperpigmentation: Secondary | ICD-10-CM | POA: Diagnosis not present

## 2018-07-15 DIAGNOSIS — L538 Other specified erythematous conditions: Secondary | ICD-10-CM | POA: Diagnosis not present

## 2018-07-15 DIAGNOSIS — D1801 Hemangioma of skin and subcutaneous tissue: Secondary | ICD-10-CM | POA: Diagnosis not present

## 2018-07-15 DIAGNOSIS — L820 Inflamed seborrheic keratosis: Secondary | ICD-10-CM | POA: Diagnosis not present

## 2018-07-15 DIAGNOSIS — L821 Other seborrheic keratosis: Secondary | ICD-10-CM | POA: Diagnosis not present

## 2018-07-20 DIAGNOSIS — M8588 Other specified disorders of bone density and structure, other site: Secondary | ICD-10-CM | POA: Diagnosis not present

## 2018-07-20 DIAGNOSIS — E2839 Other primary ovarian failure: Secondary | ICD-10-CM | POA: Diagnosis not present

## 2018-08-03 DIAGNOSIS — K5190 Ulcerative colitis, unspecified, without complications: Secondary | ICD-10-CM | POA: Diagnosis not present

## 2018-08-12 MED FILL — azaTHIOprine 50 MG TABS: 50 | 90 days supply | Qty: 180 | Fill #1

## 2018-08-12 MED FILL — ATORVASTATIN 10 MG TABLET: 10 | 90 days supply | Qty: 90 | Fill #1

## 2018-08-12 MED FILL — TRIAMTERENE/HCTZ 37.5/25 TB: 37.5-25 | 90 days supply | Qty: 90 | Fill #1

## 2018-08-12 MED FILL — PARoxetine HCL 20 MG TABS: 20 | 90 days supply | Qty: 90 | Fill #1

## 2018-09-07 DIAGNOSIS — M71572 Other bursitis, not elsewhere classified, left ankle and foot: Secondary | ICD-10-CM | POA: Diagnosis not present

## 2018-09-07 DIAGNOSIS — M2042 Other hammer toe(s) (acquired), left foot: Secondary | ICD-10-CM | POA: Diagnosis not present

## 2018-09-19 MED FILL — RESTASIS 0.05% EYE EMULSION: 0.05 | 90 days supply | Qty: 180 | Fill #0

## 2018-09-26 ENCOUNTER — Ambulatory Visit: Payer: 59 | Admitting: Podiatry

## 2018-09-26 ENCOUNTER — Encounter: Payer: 59 | Admitting: Podiatry

## 2018-09-30 NOTE — Progress Notes (Signed)
This encounter was created in error - please disregard.

## 2018-10-03 DIAGNOSIS — K5190 Ulcerative colitis, unspecified, without complications: Secondary | ICD-10-CM | POA: Diagnosis not present

## 2018-11-29 DIAGNOSIS — K5190 Ulcerative colitis, unspecified, without complications: Secondary | ICD-10-CM | POA: Diagnosis not present

## 2019-01-09 MED FILL — ATORVASTATIN 10 MG TABLET: 10 | 30 days supply | Qty: 30 | Fill #0

## 2019-01-09 MED FILL — PARoxetine HCL 20 MG TABS: 20 | 90 days supply | Qty: 90 | Fill #0

## 2019-01-09 MED FILL — azaTHIOprine 50 MG TABS: 50 | 90 days supply | Qty: 180 | Fill #0

## 2019-01-12 ENCOUNTER — Other Ambulatory Visit: Payer: Self-pay

## 2019-01-12 ENCOUNTER — Other Ambulatory Visit: Payer: Self-pay | Admitting: Family Medicine

## 2019-01-12 ENCOUNTER — Ambulatory Visit
Admission: RE | Admit: 2019-01-12 | Discharge: 2019-01-12 | Disposition: A | Discharge status: Home / Self Care | Payer: 59 | Source: Ambulatory Visit | Attending: Family Medicine | Admitting: Family Medicine

## 2019-01-12 DIAGNOSIS — R0789 Other chest pain: Secondary | ICD-10-CM | POA: Diagnosis not present

## 2019-01-12 DIAGNOSIS — R0602 Shortness of breath: Secondary | ICD-10-CM

## 2019-01-12 DIAGNOSIS — R079 Chest pain, unspecified: Secondary | ICD-10-CM | POA: Diagnosis not present

## 2019-01-12 MED FILL — predniSONE 10 MG (21) TBPK: 10 | 6 days supply | Qty: 21 | Fill #0

## 2019-01-16 MED FILL — TRIAMTERENE/HCTZ 37.5/25 TB: 37.5-25 | 90 days supply | Qty: 90 | Fill #0

## 2019-01-24 DIAGNOSIS — M25562 Pain in left knee: Secondary | ICD-10-CM | POA: Diagnosis not present

## 2019-01-24 DIAGNOSIS — M170 Bilateral primary osteoarthritis of knee: Secondary | ICD-10-CM | POA: Diagnosis not present

## 2019-01-24 DIAGNOSIS — M25462 Effusion, left knee: Secondary | ICD-10-CM | POA: Diagnosis not present

## 2019-01-24 DIAGNOSIS — G8929 Other chronic pain: Secondary | ICD-10-CM | POA: Diagnosis not present

## 2019-01-24 DIAGNOSIS — D1622 Benign neoplasm of long bones of left lower limb: Secondary | ICD-10-CM | POA: Diagnosis not present

## 2019-01-25 DIAGNOSIS — K5190 Ulcerative colitis, unspecified, without complications: Secondary | ICD-10-CM | POA: Diagnosis not present

## 2019-03-07 MED FILL — ATORVASTATIN 10 MG TABLET: 10 | 90 days supply | Qty: 90 | Fill #0

## 2019-03-22 DIAGNOSIS — K5190 Ulcerative colitis, unspecified, without complications: Secondary | ICD-10-CM | POA: Diagnosis not present

## 2019-05-12 MED FILL — AZATHIOPRINE 50 MG TABS: 50 | 60 days supply | Qty: 60 | Fill #0

## 2019-05-12 MED FILL — TRIAMTERENE/HCTZ 37.5/25 TB: 37.5-25 | 90 days supply | Qty: 90 | Fill #0

## 2019-05-23 DIAGNOSIS — K5190 Ulcerative colitis, unspecified, without complications: Secondary | ICD-10-CM | POA: Diagnosis not present

## 2019-05-30 DIAGNOSIS — Z23 Encounter for immunization: Secondary | ICD-10-CM | POA: Diagnosis not present

## 2019-05-31 ENCOUNTER — Other Ambulatory Visit: Payer: Self-pay | Admitting: Family Medicine

## 2019-05-31 DIAGNOSIS — Z1231 Encounter for screening mammogram for malignant neoplasm of breast: Secondary | ICD-10-CM

## 2019-06-01 DIAGNOSIS — K5100 Ulcerative (chronic) pancolitis without complications: Secondary | ICD-10-CM | POA: Diagnosis not present

## 2019-06-01 DIAGNOSIS — M25562 Pain in left knee: Secondary | ICD-10-CM | POA: Diagnosis not present

## 2019-06-01 DIAGNOSIS — R1013 Epigastric pain: Secondary | ICD-10-CM | POA: Diagnosis not present

## 2019-06-01 DIAGNOSIS — G8929 Other chronic pain: Secondary | ICD-10-CM | POA: Diagnosis not present

## 2019-06-06 ENCOUNTER — Telehealth: Payer: Self-pay

## 2019-06-06 NOTE — Telephone Encounter (Signed)
NOTES ON FILE FROM [phone], SENT REFERRAL TO SCHEDULING

## 2019-06-12 MED FILL — ATORVASTATIN 10 MG TABLET: 10 | 90 days supply | Qty: 90 | Fill #0

## 2019-06-15 DIAGNOSIS — Z0000 Encounter for general adult medical examination without abnormal findings: Secondary | ICD-10-CM | POA: Diagnosis not present

## 2019-06-15 DIAGNOSIS — G43009 Migraine without aura, not intractable, without status migrainosus: Secondary | ICD-10-CM | POA: Diagnosis not present

## 2019-06-15 DIAGNOSIS — K5190 Ulcerative colitis, unspecified, without complications: Secondary | ICD-10-CM | POA: Diagnosis not present

## 2019-06-15 DIAGNOSIS — Z23 Encounter for immunization: Secondary | ICD-10-CM | POA: Diagnosis not present

## 2019-06-15 DIAGNOSIS — E782 Mixed hyperlipidemia: Secondary | ICD-10-CM | POA: Diagnosis not present

## 2019-06-15 DIAGNOSIS — I10 Essential (primary) hypertension: Secondary | ICD-10-CM | POA: Diagnosis not present

## 2019-06-15 DIAGNOSIS — M8580 Other specified disorders of bone density and structure, unspecified site: Secondary | ICD-10-CM | POA: Diagnosis not present

## 2019-06-23 DIAGNOSIS — Z0000 Encounter for general adult medical examination without abnormal findings: Secondary | ICD-10-CM | POA: Diagnosis not present

## 2019-06-23 DIAGNOSIS — E782 Mixed hyperlipidemia: Secondary | ICD-10-CM | POA: Diagnosis not present

## 2019-06-23 DIAGNOSIS — K5190 Ulcerative colitis, unspecified, without complications: Secondary | ICD-10-CM | POA: Diagnosis not present

## 2019-06-23 DIAGNOSIS — I10 Essential (primary) hypertension: Secondary | ICD-10-CM | POA: Diagnosis not present

## 2019-06-23 DIAGNOSIS — G43009 Migraine without aura, not intractable, without status migrainosus: Secondary | ICD-10-CM | POA: Diagnosis not present

## 2019-06-28 ENCOUNTER — Telehealth: Payer: Self-pay

## 2019-06-28 NOTE — Telephone Encounter (Signed)
Referral notes sent to scheduling From: Dr. Timmothy Euler - GI Medicine  Phone: [phone] Fax: [phone]

## 2019-07-06 ENCOUNTER — Other Ambulatory Visit: Payer: Self-pay

## 2019-07-06 ENCOUNTER — Ambulatory Visit (INDEPENDENT_AMBULATORY_CARE_PROVIDER_SITE_OTHER): Payer: 59 | Admitting: Cardiology

## 2019-07-06 ENCOUNTER — Encounter (INDEPENDENT_AMBULATORY_CARE_PROVIDER_SITE_OTHER): Payer: Self-pay

## 2019-07-06 ENCOUNTER — Encounter: Payer: Self-pay | Admitting: Cardiology

## 2019-07-06 VITALS — BP 140/84 | HR 74 | Ht 65.0 in | Wt 176.0 lb

## 2019-07-06 DIAGNOSIS — K5190 Ulcerative colitis, unspecified, without complications: Secondary | ICD-10-CM | POA: Insufficient documentation

## 2019-07-06 DIAGNOSIS — I10 Essential (primary) hypertension: Secondary | ICD-10-CM

## 2019-07-06 DIAGNOSIS — R072 Precordial pain: Secondary | ICD-10-CM

## 2019-07-06 DIAGNOSIS — R0602 Shortness of breath: Secondary | ICD-10-CM

## 2019-07-06 DIAGNOSIS — R079 Chest pain, unspecified: Secondary | ICD-10-CM

## 2019-07-06 DIAGNOSIS — E7800 Pure hypercholesterolemia, unspecified: Secondary | ICD-10-CM

## 2019-07-06 DIAGNOSIS — K279 Peptic ulcer, site unspecified, unspecified as acute or chronic, without hemorrhage or perforation: Secondary | ICD-10-CM | POA: Diagnosis not present

## 2019-07-06 DIAGNOSIS — K5100 Ulcerative (chronic) pancolitis without complications: Secondary | ICD-10-CM | POA: Diagnosis not present

## 2019-07-06 MED ORDER — METOPROLOL TARTRATE 100 MG PO TABS: ORAL_TABLET | ORAL | 0 refills | Status: DC

## 2019-07-06 MED FILL — METOPROLOL TARTRATE 100 MG: 100 | 1 days supply | Qty: 1 | Fill #0

## 2019-07-06 NOTE — Progress Notes (Signed)
Cardiology Consult  Note    Date:  07/06/2019   ID:  Andrea [name], DOB 01/24/1956, MRN [id number]  PCP:  Dineen Kid, MD  Cardiologist:  Fransico Him, MD   Chief Complaint  Patient presents with  . Chest Pain  . Hypertension  . Hyperlipidemia    History of Present Illness:  Andrea [name] is a 63 y.o. female who is being seen today for the evaluation of chest pain at the request of Via, Lennette Bihari, MD.  This is a 64yo female with a hx of Ulcerative colitis, HLD, HTN, PUD who was seen by her GI MD at [facility] and complained of chest pain and is now here for evaluation.  Apparently back in the Winter she started getting a pressure and burning senstaion in her chest when she would got outside with any activity.  She thought it was just cold air and ignored it. Then in April she had a burning sensation in her chest while walking up a hill and since then has continued to bother her.  She says that she will get SOB with the discomfort in her chest.  She also has been having problems with early satiety and abdominal fullness.  She denies any PND, orthopnea, LE edema, dizziness, palpitations or syncope.  She has never smoked.  She has been on immunosuppressive therapy for UC with AZT then changed to  Remicade and  Imuran.    Past Medical History:  Diagnosis Date  . Chest discomfort   . Disorder of immune system ([place])   . Dyspnea on exertion   . Hypercholesterolemia   . Hypertension   . Migraine   . Non-ulcer dyspepsia   . Osteopenia of spine   . Peptic ulcer disease   . Ulcerative colitis ([place])     Past Surgical History:  Procedure Laterality Date  . hip replacement      Current Medications: Current Meds  Medication Sig  . acetaminophen (TYLENOL) 500 MG tablet Take 500 mg by mouth every 6 (six) hours as needed.  Marland Kitchen atorvastatin (LIPITOR) 20 MG tablet Take 20 mg by mouth daily.  Marland Kitchen azaTHIOprine (IMURAN) 50 MG tablet Take 50 mg by mouth daily.  . fluticasone (FLONASE) 50 MCG/ACT nasal  spray Place 2 sprays into both nostrils daily.  Marland Kitchen inFLIXimab (REMICADE) 100 MG injection Inject into the vein. Every 8 weeks  . ipratropium (ATROVENT) 0.06 % nasal spray Place 2 sprays into both nostrils 4 (four) times daily.  Marland Kitchen PARoxetine (PAXIL) 10 MG tablet Take 5 mg by mouth daily.  . valACYclovir (VALTREX) 1000 MG tablet Take 2 tablets (2,000 mg total) by mouth 2 (two) times daily. For 1 day, may repeat in the future (Patient taking differently: Take 2,000 mg by mouth 2 (two) times daily as needed (fever blister). For 1 day, may repeat in the future)    Allergies:   Codeine   Social History   Socioeconomic History  . Marital status: Divorced    Spouse name: Not on file  . Number of children: Not on file  . Years of education: Not on file  . Highest education level: Not on file  Occupational History  . Not on file  Social Needs  . Financial resource strain: Not on file  . Food insecurity    Worry: Not on file    Inability: Not on file  . Transportation needs    Medical: Not on file    Non-medical: Not on file  Tobacco Use  .  Smoking status: Never Smoker  . Smokeless tobacco: Never Used  Substance and Sexual Activity  . Alcohol use: No  . Drug use: No  . Sexual activity: Not on file  Lifestyle  . Physical activity    Days per week: Not on file    Minutes per session: Not on file  . Stress: Not on file  Relationships  . Social Herbalist on phone: Not on file    Gets together: Not on file    Attends religious service: Not on file    Active member of club or organization: Not on file    Attends meetings of clubs or organizations: Not on file    Relationship status: Not on file  Other Topics Concern  . Not on file  Social History Narrative  . Not on file     Family History:  The patient's family history includes CAD (age of onset: 89) in her brother; CAD (age of onset: 6) in her mother; Heart attack in her mother; Heart attack (age of onset: 22) in her  father.   ROS:   Please see the history of present illness.    ROS All other systems reviewed and are negative.  No flowsheet data found.     PHYSICAL EXAM:   VS:  BP 140/84   Pulse 74   Ht 5\' 5"  (1.651 m)   Wt 176 lb (79.8 kg)   BMI 29.29 kg/m    GEN: Well nourished, well developed, in no acute distress  HEENT: normal  Neck: no JVD, carotid bruits, or masses Cardiac: RRR; no murmurs, rubs, or gallops,no edema.  Intact distal pulses bilaterally.  Respiratory:  clear to auscultation bilaterally, normal work of breathing GI: soft, nontender, nondistended, + BS MS: no deformity or atrophy  Skin: warm and dry, no rash Neuro:  Alert and Oriented x 3, Strength and sensation are intact Psych: euthymic mood, full affect  Wt Readings from Last 3 Encounters:  07/06/19 176 lb (79.8 kg)  06/20/18 175 lb (79.4 kg)  05/24/16 184 lb (83.5 kg)      Studies/Labs Reviewed:   EKG:  EKG is ordered today.  The ekg ordered today demonstrates NSR with no ST changes  Recent Labs: No results found for requested labs within last 8760 hours.   Lipid Panel No results found for: CHOL, TRIG, HDL, CHOLHDL, VLDL, LDLCALC, LDLDIRECT  Additional studies/ records that were reviewed today include:  Office notes from [place]:    1. Exertional chest pain   2. Essential hypertension   3. Pure hypercholesterolemia   4. Ulcerative pancolitis without complication ([place])   5. PUD (peptic ulcer disease)   6. Precordial pain      PLAN:  In order of problems listed above:  1. Exertional chest pain -symptoms are concerning for ischemia but there are also some associated GI sx as well -she does have HTN and HLD but has never smoked although she was exposed to second hand smoke with her mother.   She has a chronic inflammatory disease and a strong fm hx of CAD at early age -will get a coronary CTA with FFR to evaluate further  2.  HTN -Bp controlled -she is currently not on  antihypertensive therapy  3.  HLD -continue atorvastatin 20mg  daily.   -LDL was 151 earlier this month -if coronary CT shows CAD will need an LDL < 70.  4.  SOB -occurs with exertion and with CP -  check 2D echo to assess LVF   Medication Adjustments/Labs and Tests Ordered: Current medicines are reviewed at length with the patient today.  Concerns regarding medicines are outlined above.  Medication changes, Labs and Tests ordered today are listed in the Patient Instructions below.  There are no Patient Instructions on file for this visit.   Signed, Fransico Him, MD  07/06/2019 3:47 PM    [facility] [place], [place], [place]  [address] Phone: [phone]; Fax: [phone]

## 2019-07-06 NOTE — Patient Instructions (Addendum)
Medication Instructions:  Your physician recommends that you continue on your current medications as directed. Please refer to the Current Medication list given to you today.  If you need a refill on your cardiac medications before your next appointment, please call your pharmacy.   Lab work: TODAY: BMET  If you have labs (blood work) drawn today and your tests are completely normal, you will receive your results only by: Marland Kitchen MyChart Message (if you have MyChart) OR . A paper copy in the mail If you have any lab test that is abnormal or we need to change your treatment, we will call you to review the results.  Testing/Procedures: Your physician has requested that you have an echocardiogram. Echocardiography is a painless test that uses sound waves to create images of your heart. It provides your doctor with information about the size and shape of your heart and how well your heart's chambers and valves are working. This procedure takes approximately one hour. There are no restrictions for this procedure.  Your physician has requested that you have cardiac CT. Cardiac computed tomography (CT) is a painless test that uses an x-ray machine to take clear, detailed pictures of your heart. For further information please visit [facility]uge[facility]. Please follow instruction sheet as given.  Follow-Up: . AS NEEDED  Any Other Special Instructions Will Be Listed Below (If Applicable).  CARDIAC CT INSTRUCTIONS  Your cardiac CT will be scheduled at one of the below locations:   [facility] [address] [place], [place] [address] (336) [place] [address] [place], [place] [address] [phone]  If scheduled at [facility], please arrive at the [facility] main entrance of [facility] 30-45 minutes prior to test start time. Proceed to the [facility] Radiology Department (first floor) to check-in  and test prep.  If scheduled at [facility], please arrive 15 mins early for check-in and test prep.  Please follow these instructions carefully (unless otherwise directed):   On the Night Before the Test: . Be sure to Drink plenty of water. . Do not consume any caffeinated/decaffeinated beverages or chocolate 12 hours prior to your test. . Do not take any antihistamines 12 hours prior to your test.   On the Day of the Test: . Drink plenty of water. Do not drink any water within one hour of the test. . Do not eat any food 4 hours prior to the test. . You may take your regular medications prior to the test.  . Take metoprolol (Lopressor) 100 MG two hours prior to test. . FEMALES- please wear underwire-free bra if available    After the Test: . Drink plenty of water. . After receiving IV contrast, you may experience a mild flushed feeling. This is normal. . On occasion, you may experience a mild rash up to 24 hours after the test. This is not dangerous. If this occurs, you can take Benadryl 25 mg and increase your fluid intake. . If you experience trouble breathing, this can be serious. If it is severe call 911 IMMEDIATELY. If it is mild, please call our office.   Please contact the cardiac imaging nurse navigator should you have any questions/concerns Marchia Bond, RN Navigator Cardiac Imaging [facility] [phone] Office

## 2019-07-08 LAB — BASIC METABOLIC PANEL
BUN/Creatinine Ratio: 16 (ref 12–28)
BUN: 14 mg/dL (ref 8–27)
CO2: 21 mmol/L (ref 20–29)
Calcium: 10.1 mg/dL (ref 8.7–10.3)
Chloride: 106 mmol/L (ref 96–106)
Creatinine, Ser: 0.85 mg/dL (ref 0.57–1.00)
GFR calc Af Amer: 84 mL/min/{1.73_m2} (ref >59)
GFR calc non Af Amer: 73 mL/min/{1.73_m2} (ref >59)
Glucose: 97 mg/dL (ref 65–99)
Potassium: 4.1 mmol/L (ref 3.5–5.2)
Sodium: 142 mmol/L (ref 134–144)

## 2019-07-10 ENCOUNTER — Telehealth: Payer: Self-pay

## 2019-07-10 NOTE — Telephone Encounter (Signed)
-----   Message from Sueanne Margarita, MD sent at 07/09/2019  7:59 PM EDT ----- Please let patient know that labs were normal.  Continue current medical therapy.

## 2019-07-10 NOTE — Telephone Encounter (Signed)
Notes recorded by Frederik Schmidt, RN on 07/10/2019 at 8:24 AM EDT  The patient has been notified of the result and verbalized understanding. All questions (if any) were answered.  Frederik Schmidt, RN 07/10/2019 8:24 AM

## 2019-07-12 MED FILL — ATORVASTATIN 20 MG TABLET: 20 | 90 days supply | Qty: 90 | Fill #0

## 2019-07-13 ENCOUNTER — Other Ambulatory Visit: Payer: Self-pay

## 2019-07-13 ENCOUNTER — Ambulatory Visit
Admission: RE | Admit: 2019-07-13 | Discharge: 2019-07-13 | Disposition: A | Discharge status: Home / Self Care | Payer: 59 | Source: Ambulatory Visit | Attending: Family Medicine | Admitting: Family Medicine

## 2019-07-13 DIAGNOSIS — Z1231 Encounter for screening mammogram for malignant neoplasm of breast: Secondary | ICD-10-CM | POA: Diagnosis not present

## 2019-07-13 DIAGNOSIS — M1712 Unilateral primary osteoarthritis, left knee: Secondary | ICD-10-CM | POA: Diagnosis not present

## 2019-07-13 DIAGNOSIS — M1711 Unilateral primary osteoarthritis, right knee: Secondary | ICD-10-CM | POA: Diagnosis not present

## 2019-07-14 ENCOUNTER — Other Ambulatory Visit (HOSPITAL_COMMUNITY): Payer: 59

## 2019-07-17 MED FILL — AZATHIOPRINE 50 MG TABS: 50 | 30 days supply | Qty: 60 | Fill #1

## 2019-07-18 ENCOUNTER — Ambulatory Visit (HOSPITAL_COMMUNITY): Payer: 59 | Attending: Cardiology

## 2019-07-18 ENCOUNTER — Other Ambulatory Visit: Payer: Self-pay

## 2019-07-18 DIAGNOSIS — R0602 Shortness of breath: Secondary | ICD-10-CM | POA: Diagnosis not present

## 2019-07-18 DIAGNOSIS — R079 Chest pain, unspecified: Secondary | ICD-10-CM | POA: Insufficient documentation

## 2019-07-23 DIAGNOSIS — K5190 Ulcerative colitis, unspecified, without complications: Secondary | ICD-10-CM | POA: Diagnosis not present

## 2019-08-03 ENCOUNTER — Telehealth: Payer: Self-pay | Admitting: Cardiology

## 2019-08-03 NOTE — Telephone Encounter (Signed)
New Message:  Patient wanted to know the results of her Echo done 10/27. She has not heard anything from the office yet.

## 2019-08-03 NOTE — Telephone Encounter (Signed)
Echo was completed on 10/27 but it appears results were not forwarded to Dr. Radford Pax. Have forwarded results for Dr. Theodosia Blender review and will call patient once reviewed.

## 2019-08-07 NOTE — Telephone Encounter (Signed)
Her chest pain resolves with rest. Will await CT scheduling.

## 2019-08-07 NOTE — Telephone Encounter (Signed)
Andrea Margarita, MD  Swinyer, Lanice Schwab, RN        Please let patient know that 2D echo showed normal LVF with mildly enlarged RA and trivial PR. Trivial pericardial effusion. Please find out if she is still having SOB   Andrea [name]    Reviewed results with patient and apologized for the delay in getting the results. Patient states she continues to have chest tightness when walking at not a very fast pace. She denies other concerns and questions were answered regarding the echo results. I advised that I will send a message to scheduling to try to get her coronary CT done soon and forward message to Dr. Radford Pax for review. I advised our office will call her back with any further advice. She thanked me for the call.

## 2019-08-07 NOTE — Telephone Encounter (Signed)
Please see if we can get coronary CTA done this week.  If she has CP that does not resolve with rest then she needs to go to the ER

## 2019-08-08 DIAGNOSIS — R3989 Other symptoms and signs involving the genitourinary system: Secondary | ICD-10-CM | POA: Diagnosis not present

## 2019-08-08 MED FILL — AMOX-CLAV 875-125 MG TABLET: 875-125 | 7 days supply | Qty: 14 | Fill #0

## 2019-08-09 ENCOUNTER — Other Ambulatory Visit: Payer: Self-pay

## 2019-08-09 ENCOUNTER — Ambulatory Visit
Admission: RE | Admit: 2019-08-09 | Discharge: 2019-08-09 | Disposition: A | Discharge status: Home / Self Care | Payer: 59 | Source: Ambulatory Visit | Attending: Cardiology | Admitting: Cardiology

## 2019-08-09 DIAGNOSIS — R072 Precordial pain: Secondary | ICD-10-CM | POA: Diagnosis not present

## 2019-08-09 MED ORDER — NITROGLYCERIN 0.4 MG SL SUBL
0.8000 mg | SUBLINGUAL_TABLET | Freq: Once | SUBLINGUAL | Status: AC
  Administered 2019-08-09: 0.8 mg via SUBLINGUAL

## 2019-08-09 MED ORDER — IOHEXOL 350 MG/ML SOLN
75.0000 mL | Freq: Once | INTRAVENOUS | Status: AC | PRN
  Administered 2019-08-09: 75 mL via INTRAVENOUS

## 2019-08-09 NOTE — Telephone Encounter (Signed)
CT is 11/18

## 2019-08-09 NOTE — Progress Notes (Signed)
Patient tolerated CT without incident. Patient drank coffee. Ambulatory steady gait to exit.

## 2019-08-10 ENCOUNTER — Telehealth: Payer: Self-pay | Admitting: Nurse Practitioner

## 2019-08-10 ENCOUNTER — Other Ambulatory Visit: Payer: Self-pay | Admitting: *Deleted

## 2019-08-10 DIAGNOSIS — E785 Hyperlipidemia, unspecified: Secondary | ICD-10-CM

## 2019-08-10 DIAGNOSIS — R079 Chest pain, unspecified: Secondary | ICD-10-CM

## 2019-08-10 MED ORDER — ASPIRIN EC 81 MG PO TBEC: 81.0000 mg | DELAYED_RELEASE_TABLET | Freq: Every day | ORAL | 3 refills | Status: DC

## 2019-08-10 MED ORDER — ISOSORBIDE MONONITRATE ER 30 MG PO TB24: 30.0000 mg | ORAL_TABLET | Freq: Every day | ORAL | 11 refills | Status: DC

## 2019-08-10 MED ORDER — ATORVASTATIN CALCIUM 80 MG PO TABS: 80.0000 mg | ORAL_TABLET | Freq: Every day | ORAL | 3 refills | Status: DC

## 2019-08-10 MED FILL — ATORVASTATIN 80 MG TABLET: 80 | 90 days supply | Qty: 90 | Fill #0

## 2019-08-10 MED FILL — ISOSORBIDE MN ER 30 MG TAB: 30 | 30 days supply | Qty: 30 | Fill #0

## 2019-08-10 NOTE — Telephone Encounter (Signed)
Reviewed Dr. Theodosia Blender advice with patient who verbalized agreement. I advised that someone from our office will call her to schedule the lexiscan myoview. I advised that we will schedule a follow-up appointment after [facility] and advised her to call back prior with questions or concerns.  I reviewed instructions for the [facility] and patient verbalized understanding. She thanked me for the call.

## 2019-08-10 NOTE — Telephone Encounter (Signed)
-----   Message from Sueanne Margarita, MD sent at 08/10/2019  3:16 PM EST ----- Please add Imdur 30mg  daily and get a Lexiscan myoview to rule out ischemia.  Followup with PA in 2 weeks

## 2019-08-22 MED FILL — FLUCONAZOLE 150 MG TABS: 150 | 3 days supply | Qty: 2 | Fill #0

## 2019-08-23 MED FILL — AZATHIOPRINE 50 MG TABS: 50 | 30 days supply | Qty: 60 | Fill #0

## 2019-08-25 ENCOUNTER — Telehealth (HOSPITAL_COMMUNITY): Payer: Self-pay | Admitting: *Deleted

## 2019-08-25 NOTE — Telephone Encounter (Signed)
Left message on voicemail per DPR in reference to upcoming appointment scheduled on 08/28/19 at 10:45 with detailed instructions given per Myocardial Perfusion Study Information Sheet for the test. LM to arrive 15 minutes early, and that it is imperative to arrive on time for appointment to keep from having the test rescheduled. If you need to cancel or reschedule your appointment, please call the office within 24 hours of your appointment. Failure to do so may result in a cancellation of your appointment, and a $50 no show fee. Phone number given for call back for any questions.

## 2019-08-28 ENCOUNTER — Other Ambulatory Visit: Payer: Self-pay

## 2019-08-28 ENCOUNTER — Ambulatory Visit (HOSPITAL_COMMUNITY): Payer: 59 | Attending: Cardiology

## 2019-08-28 DIAGNOSIS — R079 Chest pain, unspecified: Secondary | ICD-10-CM | POA: Diagnosis not present

## 2019-08-28 LAB — MYOCARDIAL PERFUSION IMAGING
LV dias vol: 78 mL (ref 46–106)
LV sys vol: 28 mL
Peak HR: 106 {beats}/min
Rest HR: 64 {beats}/min
SDS: 1
SRS: 0
SSS: 1
TID: 0.94

## 2019-08-28 MED ORDER — TECHNETIUM TC 99M TETROFOSMIN IV KIT
10.8000 | PACK | Freq: Once | INTRAVENOUS | Status: AC | PRN
  Administered 2019-08-28: 10.8 via INTRAVENOUS
  Filled 2019-08-28: qty 11

## 2019-08-28 MED ORDER — REGADENOSON 0.4 MG/5ML IV SOLN
0.4000 mg | Freq: Once | INTRAVENOUS | Status: AC
  Administered 2019-08-28: 0.4 mg via INTRAVENOUS

## 2019-08-28 MED ORDER — TECHNETIUM TC 99M TETROFOSMIN IV KIT
32.0000 | PACK | Freq: Once | INTRAVENOUS | Status: AC | PRN
  Administered 2019-08-28: 32 via INTRAVENOUS
  Filled 2019-08-28: qty 32

## 2019-08-30 ENCOUNTER — Telehealth: Payer: Self-pay

## 2019-08-30 DIAGNOSIS — L720 Epidermal cyst: Secondary | ICD-10-CM | POA: Diagnosis not present

## 2019-08-30 DIAGNOSIS — L820 Inflamed seborrheic keratosis: Secondary | ICD-10-CM | POA: Diagnosis not present

## 2019-08-30 DIAGNOSIS — L814 Other melanin hyperpigmentation: Secondary | ICD-10-CM | POA: Diagnosis not present

## 2019-08-30 DIAGNOSIS — L821 Other seborrheic keratosis: Secondary | ICD-10-CM | POA: Diagnosis not present

## 2019-08-30 DIAGNOSIS — L538 Other specified erythematous conditions: Secondary | ICD-10-CM | POA: Diagnosis not present

## 2019-08-30 DIAGNOSIS — D1801 Hemangioma of skin and subcutaneous tissue: Secondary | ICD-10-CM | POA: Diagnosis not present

## 2019-08-30 NOTE — Telephone Encounter (Signed)
Left message with patient about results of stress test. Asked her to call back or send MyChart message in regards to whether she is still experiencing chest pain.

## 2019-08-30 NOTE — Telephone Encounter (Signed)
-----   Message from Sueanne Margarita, MD sent at 08/30/2019  8:24 AM EST ----- Please let patient know that stress test was fine

## 2019-09-20 DIAGNOSIS — K5190 Ulcerative colitis, unspecified, without complications: Secondary | ICD-10-CM | POA: Diagnosis not present

## 2019-09-20 MED FILL — AZATHIOPRINE 50 MG TABS: 50 | 30 days supply | Qty: 60 | Fill #1

## 2019-09-21 MED FILL — TRIAMTERENE-HCTZ 37.5-25 MG: 37.5-25 | 90 days supply | Qty: 90 | Fill #0

## 2019-10-10 ENCOUNTER — Other Ambulatory Visit: Payer: 59

## 2019-10-10 ENCOUNTER — Other Ambulatory Visit: Payer: Self-pay

## 2019-10-10 DIAGNOSIS — E785 Hyperlipidemia, unspecified: Secondary | ICD-10-CM | POA: Diagnosis not present

## 2019-10-10 LAB — HEPATIC FUNCTION PANEL
ALT: 13 IU/L (ref 0–32)
AST: 18 IU/L (ref 0–40)
Albumin: 4.4 g/dL (ref 3.8–4.8)
Alkaline Phosphatase: 70 IU/L (ref 39–117)
Bilirubin Total: 0.6 mg/dL (ref 0.0–1.2)
Bilirubin, Direct: 0.17 mg/dL (ref 0.00–0.40)
Total Protein: 6.8 g/dL (ref 6.0–8.5)

## 2019-10-10 LAB — LIPID PANEL
Chol/HDL Ratio: 2.6 ratio (ref 0.0–4.4)
Cholesterol, Total: 181 mg/dL (ref 100–199)
HDL: 69 mg/dL (ref >39)
LDL Chol Calc (NIH): 89 mg/dL (ref 0–99)
Triglycerides: 130 mg/dL (ref 0–149)
VLDL Cholesterol Cal: 23 mg/dL (ref 5–40)

## 2019-10-12 ENCOUNTER — Telehealth: Payer: Self-pay

## 2019-10-12 DIAGNOSIS — E785 Hyperlipidemia, unspecified: Secondary | ICD-10-CM

## 2019-10-12 NOTE — Telephone Encounter (Signed)
-----   Message from Dollene Primrose, RN sent at 10/11/2019  8:32 AM EST -----  ----- Message ----- From: Sueanne Margarita, MD Sent: 10/10/2019   5:40 PM EST To: Cv Div Ch St Triage  LDL still not at goal at < 70- please add Zetia19m daily and repeat FLP and ALT in 6 weeks

## 2019-10-17 MED ORDER — EZETIMIBE 10 MG PO TABS: 10.0000 mg | ORAL_TABLET | Freq: Every day | ORAL | 3 refills | Status: DC

## 2019-10-17 NOTE — Telephone Encounter (Signed)
The patient has been notified of the result and verbalized understanding.  All questions (if any) were answered. Zetia prescription has been sent in to patient's updated preferred pharmacy. Repeat lab work has been scheudled. Antonieta Iba, RN 10/17/2019 9:28 AM

## 2019-10-19 ENCOUNTER — Ambulatory Visit: Payer: 59

## 2019-10-28 ENCOUNTER — Ambulatory Visit: Payer: Medicare Other | Attending: Internal Medicine

## 2019-10-28 DIAGNOSIS — Z23 Encounter for immunization: Secondary | ICD-10-CM | POA: Insufficient documentation

## 2019-10-28 NOTE — Progress Notes (Signed)
   Covid-19 Vaccination Clinic  Name:  Andrea [name]    MRN: [id number] DOB: 09-11-1956  10/28/2019  Andrea [name] was observed post Covid-19 immunization for 15 minutes without incidence. She was provided with Vaccine Information Sheet and instruction to access the V-Safe system.   Andrea [name] was instructed to call 911 with any severe reactions post vaccine: Marland Kitchen Difficulty breathing  . Swelling of your face and throat  . A fast heartbeat  . A bad rash all over your body  . Dizziness and weakness    Immunizations Administered    Name Date Dose VIS Date Route   Pfizer COVID-19 Vaccine 10/28/2019  8:26 AM 0.3 mL 09/01/2019 Intramuscular   Manufacturer: [place]   Lot: [id number]   [place]: [address]-2

## 2019-11-09 ENCOUNTER — Ambulatory Visit: Payer: 59

## 2019-11-20 ENCOUNTER — Other Ambulatory Visit: Payer: Self-pay | Admitting: Cardiology

## 2019-11-22 ENCOUNTER — Ambulatory Visit: Payer: Medicare Other | Attending: Internal Medicine

## 2019-11-22 DIAGNOSIS — Z23 Encounter for immunization: Secondary | ICD-10-CM

## 2019-11-22 NOTE — Progress Notes (Signed)
   Covid-19 Vaccination Clinic  Name:  [name]    MRN: [id number] DOB: 1956/01/01  11/22/2019  [name] was observed post Covid-19 immunization for 15 minutes without incident. She was provided with Vaccine Information Sheet and instruction to access the V-Safe system.   [name] was instructed to call 911 with any severe reactions post vaccine: Marland Kitchen Difficulty breathing  . Swelling of face and throat  . A fast heartbeat  . A bad rash all over body  . Dizziness and weakness   Immunizations Administered    Name Date Dose VIS Date Route   Pfizer COVID-19 Vaccine 11/22/2019  8:25 AM 0.3 mL 09/01/2019 Intramuscular   Manufacturer: [place]   Lot: [id number]   [place]: [address]-0

## 2019-11-23 ENCOUNTER — Other Ambulatory Visit: Payer: Medicare Other | Admitting: *Deleted

## 2019-11-23 ENCOUNTER — Other Ambulatory Visit: Payer: Self-pay

## 2019-11-23 DIAGNOSIS — E785 Hyperlipidemia, unspecified: Secondary | ICD-10-CM

## 2019-11-23 LAB — LIPID PANEL
Chol/HDL Ratio: 2.1 ratio (ref 0.0–4.4)
Cholesterol, Total: 141 mg/dL (ref 100–199)
HDL: 68 mg/dL (ref >39)
LDL Chol Calc (NIH): 53 mg/dL (ref 0–99)
Triglycerides: 115 mg/dL (ref 0–149)
VLDL Cholesterol Cal: 20 mg/dL (ref 5–40)

## 2019-11-23 LAB — ALT: ALT: 17 IU/L (ref 0–32)

## 2020-09-24 ENCOUNTER — Other Ambulatory Visit: Payer: Self-pay | Admitting: Family Medicine

## 2020-09-24 DIAGNOSIS — Z0000 Encounter for general adult medical examination without abnormal findings: Secondary | ICD-10-CM

## 2020-11-01 ENCOUNTER — Ambulatory Visit
Admission: RE | Admit: 2020-11-01 | Discharge: 2020-11-01 | Disposition: A | Discharge status: Home / Self Care | Payer: Medicare Other | Source: Ambulatory Visit | Attending: Family Medicine | Admitting: Family Medicine

## 2020-11-01 ENCOUNTER — Other Ambulatory Visit: Payer: Self-pay

## 2020-11-01 DIAGNOSIS — Z0000 Encounter for general adult medical examination without abnormal findings: Secondary | ICD-10-CM

## 2020-11-13 ENCOUNTER — Other Ambulatory Visit: Payer: Self-pay | Admitting: Cardiology

## 2021-01-04 ENCOUNTER — Encounter (HOSPITAL_BASED_OUTPATIENT_CLINIC_OR_DEPARTMENT_OTHER): Payer: Self-pay | Admitting: Emergency Medicine

## 2021-01-04 ENCOUNTER — Other Ambulatory Visit: Payer: Self-pay

## 2021-01-04 ENCOUNTER — Emergency Department (HOSPITAL_BASED_OUTPATIENT_CLINIC_OR_DEPARTMENT_OTHER)
Admission: EM | Admit: 2021-01-04 | Discharge: 2021-01-04 | Disposition: A | Discharge status: Home / Self Care | Payer: Medicare Other | Attending: Emergency Medicine | Admitting: Emergency Medicine

## 2021-01-04 DIAGNOSIS — R197 Diarrhea, unspecified: Secondary | ICD-10-CM | POA: Diagnosis not present

## 2021-01-04 DIAGNOSIS — Z7982 Long term (current) use of aspirin: Secondary | ICD-10-CM | POA: Insufficient documentation

## 2021-01-04 DIAGNOSIS — I10 Essential (primary) hypertension: Secondary | ICD-10-CM | POA: Diagnosis not present

## 2021-01-04 DIAGNOSIS — R112 Nausea with vomiting, unspecified: Secondary | ICD-10-CM

## 2021-01-04 DIAGNOSIS — M791 Myalgia, unspecified site: Secondary | ICD-10-CM | POA: Insufficient documentation

## 2021-01-04 DIAGNOSIS — Z79899 Other long term (current) drug therapy: Secondary | ICD-10-CM | POA: Diagnosis not present

## 2021-01-04 DIAGNOSIS — R109 Unspecified abdominal pain: Secondary | ICD-10-CM | POA: Insufficient documentation

## 2021-01-04 LAB — COMPREHENSIVE METABOLIC PANEL
ALT: 23 U/L (ref 0–44)
AST: 28 U/L (ref 15–41)
Albumin: 3.8 g/dL (ref 3.5–5.0)
Alkaline Phosphatase: 42 U/L (ref 38–126)
Anion gap: 9 (ref 5–15)
BUN: 11 mg/dL (ref 8–23)
CO2: 24 mmol/L (ref 22–32)
Calcium: 9.4 mg/dL (ref 8.9–10.3)
Chloride: 102 mmol/L (ref 98–111)
Creatinine, Ser: 0.59 mg/dL (ref 0.44–1.00)
GFR, Estimated: >60 mL/min (ref >60)
Glucose, Bld: 130 mg/dL — ABNORMAL HIGH (ref 70–99)
Potassium: 3.4 mmol/L — ABNORMAL LOW (ref 3.5–5.1)
Sodium: 135 mmol/L (ref 135–145)
Total Bilirubin: 0.7 mg/dL (ref 0.3–1.2)
Total Protein: 7.4 g/dL (ref 6.5–8.1)

## 2021-01-04 LAB — CBC WITH DIFFERENTIAL/PLATELET
Abs Immature Granulocytes: 0.01 10*3/uL (ref 0.00–0.07)
Basophils Absolute: 0 10*3/uL (ref 0.0–0.1)
Basophils Relative: 0 %
Eosinophils Absolute: 0 10*3/uL (ref 0.0–0.5)
Eosinophils Relative: 0 %
HCT: 39.4 % (ref 36.0–46.0)
Hemoglobin: 13.6 g/dL (ref 12.0–15.0)
Immature Granulocytes: 0 %
Lymphocytes Relative: 23 %
Lymphs Abs: 1 10*3/uL (ref 0.7–4.0)
MCH: 30.3 pg (ref 26.0–34.0)
MCHC: 34.5 g/dL (ref 30.0–36.0)
MCV: 87.8 fL (ref 80.0–100.0)
Monocytes Absolute: 0.5 10*3/uL (ref 0.1–1.0)
Monocytes Relative: 11 %
Neutro Abs: 2.8 10*3/uL (ref 1.7–7.7)
Neutrophils Relative %: 66 %
Platelets: 194 10*3/uL (ref 150–400)
RBC: 4.49 MIL/uL (ref 3.87–5.11)
RDW: 14.1 % (ref 11.5–15.5)
WBC: 4.2 10*3/uL (ref 4.0–10.5)
nRBC: 0 % (ref 0.0–0.2)

## 2021-01-04 LAB — LIPASE, BLOOD: Lipase: 24 U/L (ref 11–51)

## 2021-01-04 MED ORDER — SODIUM CHLORIDE 0.9 % IV BOLUS
1000.0000 mL | Freq: Once | INTRAVENOUS | Status: AC
  Administered 2021-01-04: 1000 mL via INTRAVENOUS

## 2021-01-04 MED ORDER — ONDANSETRON HCL 4 MG PO TABS: 4.0000 mg | ORAL_TABLET | Freq: Four times a day (QID) | ORAL | 0 refills | Status: DC

## 2021-01-04 MED ORDER — PROMETHAZINE HCL 12.5 MG RE SUPP: 12.5000 mg | Freq: Three times a day (TID) | RECTAL | 0 refills | Status: DC | PRN

## 2021-01-04 MED ORDER — ONDANSETRON HCL 4 MG/2ML IJ SOLN
4.0000 mg | Freq: Once | INTRAMUSCULAR | Status: AC
  Administered 2021-01-04: 4 mg via INTRAVENOUS
  Filled 2021-01-04: qty 2

## 2021-01-04 NOTE — ED Triage Notes (Signed)
Reports n/v/d that started on Thursday.  Grandchildren had same symptoms on Tuesday.  No longer having diarrhea.  Also endorses aching all over and abdominal cramping.

## 2021-01-04 NOTE — ED Provider Notes (Signed)
[place] EMERGENCY DEPARTMENT Provider Note   CSN: [id number] Arrival date & time: 01/04/21  1700     History Chief Complaint  Patient presents with  . Vomiting    Andrea [name] is a 65 y.o. female.  The history is provided by the patient.  Emesis Severity:  Moderate Timing:  Constant Able to tolerate:  Liquids Progression:  Unchanged Chronicity:  New Recent urination:  Normal Relieved by:  Nothing Worsened by:  Nothing Associated symptoms: abdominal pain, diarrhea and myalgias   Associated symptoms: no arthralgias, no chills, no cough, no fever, no headaches, no sore throat and no URI   Risk factors: sick contacts (grandchildren both with same symptoms of n/v/d)        Past Medical History:  Diagnosis Date  . Chest discomfort   . Disorder of immune system ([place])   . Dyspnea on exertion   . Hypercholesterolemia   . Hypertension   . Migraine   . Non-ulcer dyspepsia   . Osteopenia of spine   . Peptic ulcer disease   . Ulcerative colitis [facility])     Patient Active Problem List   Diagnosis Date Noted  . Ulcerative colitis ([place]) 07/06/2019  . PUD (peptic ulcer disease) 07/06/2019    Past Surgical History:  Procedure Laterality Date  . hip replacement       OB History   No obstetric history on file.     Family History  Problem Relation Age of Onset  . CAD Mother 26  . Heart attack Mother   . Heart attack Father 87  . CAD Brother 18    Social History   Tobacco Use  . Smoking status: Never Smoker  . Smokeless tobacco: Never Used  Substance Use Topics  . Alcohol use: No  . Drug use: No    Home Medications Prior to Admission medications   Medication Sig Start Date End Date Taking? Authorizing Provider  ondansetron (ZOFRAN) 4 MG tablet Take 1 tablet (4 mg total) by mouth every 6 (six) hours. 01/04/21  Yes Keeghan Mcintire, DO  acetaminophen (TYLENOL) 500 MG tablet Take 500 mg by mouth every 6 (six) hours as needed.    [provider]  aspirin EC 81 MG tablet Take 1 tablet (81 mg total) by mouth daily. 08/10/19   Sueanne Margarita, MD  atorvastatin (LIPITOR) 80 MG tablet Take 1 tablet (80 mg total) by mouth daily. Please make appointment with Dr. Radford Pax for future refills 11/14/20   Sueanne Margarita, MD  azaTHIOprine (IMURAN) 50 MG tablet Take 50 mg by mouth daily.    [provider]  ezetimibe (ZETIA) 10 MG tablet TAKE 1 TABLET BY MOUTH EVERY DAY 11/21/19   Sueanne Margarita, MD  fluticasone (FLONASE) 50 MCG/ACT nasal spray Place 2 sprays into both nostrils daily. 11/26/17   Tasia Catchings, Amy V, PA-C  inFLIXimab (REMICADE) 100 MG injection Inject into the vein. Every 8 weeks    [provider]  ipratropium (ATROVENT) 0.06 % nasal spray Place 2 sprays into both nostrils 4 (four) times daily. 11/26/17   Tasia Catchings, Amy V, PA-C  isosorbide mononitrate (IMDUR) 30 MG 24 hr tablet Take 1 tablet (30 mg total) by mouth daily. 08/10/19 11/08/19  Sueanne Margarita, MD  metoprolol tartrate (LOPRESSOR) 100 MG tablet Take 1 tablet by mouth 2 hours prior to Cardiac CT 07/06/19   Sueanne Margarita, MD  PARoxetine (PAXIL) 10 MG tablet Take 5 mg by mouth daily.  [provider]  valACYclovir (VALTREX) 1000 MG tablet Take 2 tablets (2,000 mg total) by mouth 2 (two) times daily. For 1 day, may repeat in the future Patient taking differently: Take 2,000 mg by mouth 2 (two) times daily as needed (fever blister). For 1 day, may repeat in the future 02/21/18   Benjamine Mola, FNP    Allergies    Codeine  Review of Systems   Review of Systems  Constitutional: Negative for chills and fever.  HENT: Negative for ear pain and sore throat.   Eyes: Negative for pain and visual disturbance.  Respiratory: Negative for cough and shortness of breath.   Cardiovascular: Negative for chest pain and palpitations.  Gastrointestinal: Positive for abdominal pain, diarrhea, nausea and vomiting.  Genitourinary: Negative for dysuria and hematuria.   Musculoskeletal: Positive for myalgias. Negative for arthralgias and back pain.  Skin: Negative for color change and rash.  Neurological: Negative for seizures, syncope and headaches.  All other systems reviewed and are negative.   Physical Exam Updated Vital Signs BP (!) 170/85   Pulse 71   Temp 97.6 F (36.4 C) (Oral)   Resp 18   Ht 5' 5"  (1.651 m)   Wt 77.1 kg   SpO2 99%   BMI 28.29 kg/m   Physical Exam Vitals and nursing note reviewed.  Constitutional:      General: She is not in acute distress.    Appearance: She is well-developed. She is not ill-appearing.  HENT:     Head: Normocephalic and atraumatic.     Mouth/Throat:     Mouth: Mucous membranes are dry.  Eyes:     Extraocular Movements: Extraocular movements intact.     Conjunctiva/sclera: Conjunctivae normal.     Pupils: Pupils are equal, round, and reactive to light.  Cardiovascular:     Rate and Rhythm: Normal rate and regular rhythm.     Pulses: Normal pulses.     Heart sounds: Normal heart sounds. No murmur heard.   Pulmonary:     Effort: Pulmonary effort is normal. No respiratory distress.     Breath sounds: Normal breath sounds.  Abdominal:     General: Abdomen is flat. There is no distension.     Palpations: Abdomen is soft.     Tenderness: There is no abdominal tenderness. There is no guarding.     Hernia: No hernia is present.  Musculoskeletal:     Cervical back: Normal range of motion and neck supple.  Skin:    General: Skin is warm and dry.     Capillary Refill: Capillary refill takes less than 2 seconds.  Neurological:     General: No focal deficit present.     Mental Status: She is alert.  Psychiatric:        Mood and Affect: Mood normal.     ED Results / Procedures / Treatments   Labs (all labs ordered are listed, but only abnormal results are displayed) Labs Reviewed  COMPREHENSIVE METABOLIC PANEL - Abnormal; Notable for the following components:      Result Value   Potassium  3.4 (*)    Glucose, Bld 130 (*)    All other components within normal limits  CBC WITH DIFFERENTIAL/PLATELET  LIPASE, BLOOD    EKG None  Radiology No results found.  Procedures Procedures   Medications Ordered in ED Medications  sodium chloride 0.9 % bolus 1,000 mL (0 mLs Intravenous Stopped 01/04/21 1812)  ondansetron (ZOFRAN) injection 4 mg (4 mg Intravenous  Given 01/04/21 1720)    ED Course  I have reviewed the triage vital signs and the nursing notes.  Pertinent labs & imaging results that were available during my care of the patient were reviewed by me and considered in my medical decision making (see chart for details).    MDM Rules/Calculators/A&P                          DALEYSA KRISTIANSEN is here with nausea, vomiting, diarrhea.  History of high cholesterol.  Patient with 2 grandchildren with similar symptoms.  Having a hard time keeping fluids down.  Diarrhea has slowed down but mostly still feels nauseous.  Suspect viral process.  Has no abdominal tenderness on exam.  Has some dry mucous membranes.  Will give IV fluids, IV Zofran and check basic labs for AKI/electrolyte abnormalities.  No concern for bowel obstruction, appendicitis or other intra-abdominal infection.  Lab work unremarkable.  No significant anemia, electrolyte abnormality, kidney injury.  Gallbladder and liver enzymes within normal limits.  Doubt cholecystitis.  Overall suspect viral process.  Feeling better after IV fluids and IV Zofran.  Will prescribe Zofran.  Recommend bland diet.  Discharged in good condition.  This chart was dictated using voice recognition software.  Despite best efforts to proofread,  errors can occur which can change the documentation meaning.   Final Clinical Impression(s) / ED Diagnoses Final diagnoses:  Nausea and vomiting, intractability of vomiting not specified, unspecified vomiting type    Rx / DC Orders ED Discharge Orders         Ordered    ondansetron (ZOFRAN) 4 MG  tablet  Every 6 hours        01/04/21 1845           Lennice Sites, DO 01/04/21 1845

## 2021-02-25 ENCOUNTER — Telehealth: Payer: Self-pay | Admitting: Gastroenterology

## 2021-02-25 NOTE — Telephone Encounter (Signed)
Good morning Dr. Loletha Carrow, we received a paper referral from Dr. Carlos Levering with [facility] to schedule patient with you for infliximab infusions.  She last saw other GI May of this year but wants something closer to home.  Will send referral to you, records are also in [place].  Can you please review and advise on scheduling?  Thank you.

## 2021-02-27 ENCOUNTER — Other Ambulatory Visit: Payer: Self-pay | Admitting: Cardiology

## 2021-02-27 ENCOUNTER — Encounter: Payer: Self-pay | Admitting: Gastroenterology

## 2021-02-27 NOTE — Telephone Encounter (Signed)
I reviewed the records from Dr. Carlos Levering, and I will accept her as a new patient.  She can be given the next available new patient appointment with me.  Due to our current high referral volume, it is likely to be at least 4 to 6 weeks for a new patient appointment with me.  Therefore, she needs to continue infliximab infusions with her current GI physician until such time as I evaluate her and those arrangements can be made with a local infusion center.  - HD

## 2021-02-27 NOTE — Telephone Encounter (Signed)
Called patient and scheduled for 04/01/21 but also informed to continue with current GI until she comes for office visit.

## 2021-03-27 ENCOUNTER — Other Ambulatory Visit: Payer: Self-pay | Admitting: Cardiology

## 2021-04-01 ENCOUNTER — Ambulatory Visit: Payer: Medicare Other | Admitting: Gastroenterology

## 2021-04-17 ENCOUNTER — Encounter: Payer: Self-pay | Admitting: Gastroenterology

## 2021-04-17 ENCOUNTER — Ambulatory Visit (INDEPENDENT_AMBULATORY_CARE_PROVIDER_SITE_OTHER): Payer: Medicare Other | Admitting: Gastroenterology

## 2021-04-17 VITALS — BP 150/80 | HR 66 | Ht 65.0 in | Wt 170.6 lb

## 2021-04-17 DIAGNOSIS — Z79899 Other long term (current) drug therapy: Secondary | ICD-10-CM

## 2021-04-17 DIAGNOSIS — K5130 Ulcerative (chronic) rectosigmoiditis without complications: Secondary | ICD-10-CM | POA: Diagnosis not present

## 2021-04-17 DIAGNOSIS — Z7960 Long term (current) use of unspecified immunomodulators and immunosuppressants: Secondary | ICD-10-CM

## 2021-04-17 NOTE — Patient Instructions (Signed)
If you are age 66 or older, your body mass index should be between 23-30. Your Body mass index is 28.39 kg/m. If this is out of the aforementioned range listed, please consider follow up with your Primary Care Provider.  If you are age 52 or younger, your body mass index should be between 19-25. Your Body mass index is 28.39 kg/m. If this is out of the aformentioned range listed, please consider follow up with your Primary Care Provider.   __________________________________________________________  The [facility] providers would like to encourage you to use [facility] to communicate with providers for non-urgent requests or questions.  Due to long hold times on the telephone, sending your provider a message by [facility] may be a faster and more efficient way to get a response.  Please allow 48 business hours for a response.  Please remember that this is for non-urgent requests.   It was a pleasure to see you today!  Thank you for trusting me with your gastrointestinal care!

## 2021-04-17 NOTE — Progress Notes (Signed)
[facility] Consult Note:  History: Andrea [name] 04/17/2021  Referring provider: Bradly Chris, MD [facility])  Reason for consult/chief complaint: Infliximab infusion (She has been a [name] patient for 25 years and wants to do infusions closer to home)   Subjective  HPI: Andrea [name] was referred by Dr. Marnee Spring of the [facility] GI department at the patient's request for a GI provider closer to home in order to receive her infliximab infusions. Dr. Ephriam Knuckles most recent office note from 01/20/2021 indicates ulcerative colitis active in the left colon diagnosed in March 2005.  Colonoscopy January 2007 apparently showed erythema and granularity from rectum to transverse colon, TI normal This note also outlines multiple subsequent colonoscopies in 2008, 2009, 2010, 2014.  In December 2017 she had a normal EGD with a negative CLOtest, and at that time also a colonoscopy to the terminal ileum.  Chromoendoscopy was performed, 2 hyperplastic polyps were found in the rectum, mild scarring of rectosigmoid and no active IBD. Most recent colonoscopy October 2021 was performed to the terminal ileum, with a 4 mm rectal hyperplastic polyp and sigmoid diverticuli.  Dr.Hansen's assessment is ulcerative colitis and stable long-term symptomatic remission on infliximab plus low-dose azathioprine.  Patient has had regular monitoring with blood work and dermatology evaluation (due to AZA).  Andrea [name] recalls being diagnosed with ulcerative proctosigmoiditis around age 60, and had care in [place] for several years.  She had frequent flares and that therapy was not working well, and that individual recommended surgery.  She was then referred by her primary care provider to [name], some additional treatments were tried before she was finally offered infliximab, which was not yet approved for use in ulcerative colitis at that time.  Nevertheless, she was able to get it and had quick resolution of symptoms and  durable remission since then.  Her last infusion was on 03/03/2021, and she has her next infusion scheduled at [name] in a couple of weeks.  She has not had any allergic reactions to infusions, and has been up-to-date on her vaccinations including flu, COVID, pneumococcus and shingles.  Her bowel habits are regular without rectal bleeding.  Her appetite is good and weight stable.  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   ROS:  Review of Systems  Constitutional:  Negative for appetite change and unexpected weight change.  HENT:  Negative for mouth sores and voice change.   Eyes:  Negative for pain and redness.  Respiratory:  Negative for cough and shortness of breath.   Cardiovascular:  Negative for chest pain and palpitations.  Genitourinary:  Negative for dysuria and hematuria.  Musculoskeletal:  Negative for arthralgias and myalgias.  Skin:  Negative for pallor and rash.  Neurological:  Negative for weakness and headaches.  Hematological:  Negative for adenopathy.    Past Medical History: Past Medical History:  Diagnosis Date   Arthritis    Chest discomfort    Disorder of immune system ([place])    Dyspnea on exertion    Hypercholesterolemia    Hypertension    Migraine    Non-ulcer dyspepsia    Osteopenia of spine    Peptic ulcer disease    Pneumonia    Ulcerative colitis ([place])      Past Surgical History: Past Surgical History:  Procedure Laterality Date   COLONOSCOPY     ESOPHAGOGASTRODUODENOSCOPY     hip replacement Bilateral      Family History:  Family History  Problem Relation Age of Onset   CAD Mother 71   Heart attack Mother    Heart attack Father 39   CAD Brother 20   Skin cancer Brother    Other Son        gluten sensitivity   Colon cancer Neg Hx     Social History: Social History   Socioeconomic History   Marital status: Divorced    Spouse name: Not on file   Number of  children: 3   Years of education: Not on file   Highest education level: Not on file  Occupational History   Occupation: retired  Tobacco Use   Smoking status: Never   Smokeless tobacco: Never  Vaping Use   Vaping Use: Never used  Substance and Sexual Activity   Alcohol use: No   Drug use: No   Sexual activity: Not on file  Other Topics Concern   Not on file  Social History Narrative   Not on file   Social Determinants of Health   Financial Resource Strain: Not on file  Food Insecurity: Not on file  Transportation Needs: Not on file  Physical Activity: Not on file  Stress: Not on file  Social Connections: Not on file    Allergies: Allergies  Allergen Reactions   Codeine Nausea And Vomiting and Nausea Only    Outpatient Meds: Current Outpatient Medications  Medication Sig Dispense Refill   atorvastatin (LIPITOR) 80 MG tablet Take 1 tablet (80 mg total) by mouth daily. Please make appointment with Dr. Radford Pax for future refills 90 tablet 0   azaTHIOprine (IMURAN) 50 MG tablet Take 50 mg by mouth daily.     ezetimibe (ZETIA) 10 MG tablet Take 1 tablet (10 mg total) by mouth daily. Please keep upcoming appt in October 2022 with Dr. Radford Pax before anymore refills. Thank you Final Attempt 30 tablet 2   fluticasone (FLONASE) 50 MCG/ACT nasal spray Place 2 sprays into both nostrils daily. 1 g 0   inFLIXimab (REMICADE) 100 MG injection Inject into the vein. Every 8 weeks     ipratropium (ATROVENT) 0.06 % nasal spray Place 2 sprays into both nostrils 4 (four) times daily. 15 mL 0   PARoxetine (PAXIL) 10 MG tablet Take 5 mg by mouth daily.     ondansetron (ZOFRAN) 4 MG tablet Take 1 tablet (4 mg total) by mouth every 6 (six) hours. (Patient taking differently: Take 4 mg by mouth every 6 (six) hours. As needed for mirgraines) 12 tablet 0   promethazine (PHENERGAN) 12.5 MG suppository Place 1 suppository (12.5 mg total) rectally every 8 (eight) hours as needed for up to 6 doses for  nausea or vomiting. 6 each 0   No current facility-administered medications for this visit.    Remicade infusion documentation from 01/06/2021 indicates her dose is 400 mg  ___________________________________________________________________ Objective   Exam:  BP (!) 150/80   Pulse 66   Ht 5' 5"  (1.651 m)   Wt 170 lb 9.6 oz (77.4 kg)   BMI 28.39 kg/m  Wt Readings from Last 3 Encounters:  04/17/21 170 lb 9.6 oz (77.4 kg)  01/04/21 170 lb (77.1 kg)  08/28/19 173 lb (78.5 kg)    General: Well-appearing Eyes: sclera anicteric, no redness ENT: oral mucosa moist without lesions, no cervical or supraclavicular lymphadenopathy CV: RRR without murmur, S1/S2, no JVD, no peripheral edema Resp: clear to auscultation bilaterally, normal RR and effort noted GI: soft, no tenderness, with active bowel sounds. No  guarding or palpable organomegaly noted. Skin; warm and dry, no rash or jaundice noted.  Tan Neuro: awake, alert and oriented x 3. Normal gross motor function and fluent speech  Labs:  From 01/06/2021:   WBC 4.9, hemoglobin 13, hematocrit 38, platelet 183 ALT 23 on 11/11/2020, 17 on 09/10/2020, and 21 on 07/16/2020 Last BUN and creatinine normal on 10/21/2020 (AST 34 and ALT 45 that day, also had a troponin and D-dimer checked  ? ED visit)  Andrea [name] believes her last bone density study with primary care was in the last 2 to 3 years.  I have asked her to check with that provider at [facility] to see if she is due.   Other:  Pathology report from 06/27/2020: Rectal hyperplastic polyp  The colonoscopy report is also included with these records. Assessment: Encounter Diagnoses  Name Primary?   Ulcerative rectosigmoiditis without complication (HCC) Yes   Long-term use of immunosuppressant medication     Longstanding ulcerative proctosigmoiditis with durable remission on infliximab and low-dose azathioprine. Tolerates treatment well, normal LFTs, up-to-date on  vaccinations.  Plan: We will assume management of her condition and make arrangements for her to have infliximab infusions at the [facility] health outpatient infusion [name].  She will continue current dose of azathioprine and get regular lab and office follow-up, timing to be determined once she is on schedule for infliximab.  She will need to get her next infusion as scheduled at [facility] while we undergo insurance authorization and arrangement of her infusions at our facility.  Consult with primary care about when she is due for bone density.  She will also continue to get COVID and flu vaccines regularly.  Would also be helpful to determine when she last had pneumococcal vaccines to see when those would next be due.  Thank you for the courtesy of this consult.  Please call me with any questions or concerns.  Nelida Meuse III  CC: Referring provider noted above

## 2021-04-18 ENCOUNTER — Encounter: Payer: Self-pay | Admitting: Gastroenterology

## 2021-04-18 NOTE — Addendum Note (Signed)
Addended by: Nelida Meuse on: 04/18/2021 05:33 PM   Modules accepted: Orders

## 2021-04-21 ENCOUNTER — Telehealth: Payer: Self-pay | Admitting: Pharmacy Technician

## 2021-04-21 ENCOUNTER — Other Ambulatory Visit: Payer: Self-pay | Admitting: Pharmacy Technician

## 2021-04-21 NOTE — Telephone Encounter (Signed)
Auth Submission: NO AUTH REQUIRED - PATIENT TRACKING  Payer: MEDICARE/BCBS Medication & CPT/J Code(s) submitted: Remicade (Infliximab) J1745 Route of submission (phone, fax, portal): PHONE: [phone] Auth type: Buy/Bill Units/visits requested: 4 Reference number: [id number]

## 2021-05-21 ENCOUNTER — Other Ambulatory Visit: Payer: Self-pay

## 2021-05-21 ENCOUNTER — Ambulatory Visit (INDEPENDENT_AMBULATORY_CARE_PROVIDER_SITE_OTHER): Payer: Medicare Other

## 2021-05-21 VITALS — BP 157/77 | HR 74 | Temp 97.9°F | Resp 16 | Ht 65.0 in | Wt 169.2 lb

## 2021-05-21 DIAGNOSIS — K5130 Ulcerative (chronic) rectosigmoiditis without complications: Secondary | ICD-10-CM

## 2021-05-21 MED ORDER — ANTICOAGULANT SODIUM CITRATE 4% (200MG/5ML) IV SOLN: 5.0000 mL | Freq: Once | Status: DC | PRN

## 2021-05-21 MED ORDER — FAMOTIDINE IN NACL 20-0.9 MG/50ML-% IV SOLN: 20.0000 mg | Freq: Once | INTRAVENOUS | Status: DC | PRN

## 2021-05-21 MED ORDER — EPINEPHRINE 0.3 MG/0.3ML IJ SOAJ: 0.3000 mg | Freq: Once | INTRAMUSCULAR | Status: DC | PRN

## 2021-05-21 MED ORDER — HYDROCORTISONE NA SUCCINATE PF 100 MG IJ SOLR
100.0000 mg | Freq: Once | INTRAMUSCULAR | Status: AC
  Administered 2021-05-21: 100 mg via INTRAVENOUS
  Filled 2021-05-21: qty 2

## 2021-05-21 MED ORDER — SODIUM CHLORIDE 0.9% FLUSH: 10.0000 mL | Freq: Once | INTRAVENOUS | Status: DC | PRN

## 2021-05-21 MED ORDER — DIPHENHYDRAMINE HCL 25 MG PO CAPS
25.0000 mg | ORAL_CAPSULE | Freq: Once | ORAL | Status: AC
  Administered 2021-05-21: 25 mg via ORAL
  Filled 2021-05-21: qty 1

## 2021-05-21 MED ORDER — ALTEPLASE 2 MG IJ SOLR: 2.0000 mg | Freq: Once | INTRAMUSCULAR | Status: DC | PRN

## 2021-05-21 MED ORDER — ALBUTEROL SULFATE HFA 108 (90 BASE) MCG/ACT IN AERS: 2.0000 | INHALATION_SPRAY | Freq: Once | RESPIRATORY_TRACT | Status: DC | PRN

## 2021-05-21 MED ORDER — ACETAMINOPHEN 325 MG PO TABS
650.0000 mg | ORAL_TABLET | Freq: Once | ORAL | Status: AC
  Administered 2021-05-21: 650 mg via ORAL
  Filled 2021-05-21: qty 2

## 2021-05-21 MED ORDER — HEPARIN SOD (PORK) LOCK FLUSH 100 UNIT/ML IV SOLN: 500.0000 [IU] | Freq: Once | INTRAVENOUS | Status: DC | PRN

## 2021-05-21 MED ORDER — HEPARIN SOD (PORK) LOCK FLUSH 100 UNIT/ML IV SOLN: 250.0000 [IU] | Freq: Once | INTRAVENOUS | Status: DC | PRN

## 2021-05-21 MED ORDER — METHYLPREDNISOLONE SODIUM SUCC 125 MG IJ SOLR: 125.0000 mg | Freq: Once | INTRAMUSCULAR | Status: DC | PRN

## 2021-05-21 MED ORDER — SODIUM CHLORIDE 0.9% FLUSH: 3.0000 mL | Freq: Once | INTRAVENOUS | Status: DC | PRN

## 2021-05-21 MED ORDER — DIPHENHYDRAMINE HCL 50 MG/ML IJ SOLN: 50.0000 mg | Freq: Once | INTRAMUSCULAR | Status: DC | PRN

## 2021-05-21 MED ORDER — SODIUM CHLORIDE 0.9 % IV SOLN
5.0000 mg/kg | Freq: Once | INTRAVENOUS | Status: DC
  Filled 2021-05-21: qty 40

## 2021-05-21 MED ORDER — SODIUM CHLORIDE 0.9 % IV SOLN: Freq: Once | INTRAVENOUS | Status: DC | PRN

## 2021-05-21 NOTE — Progress Notes (Signed)
Diagnosis: Crohn's Disease  Provider:  Marshell Garfinkel, MD  Procedure: Infusion  IV Type: Peripheral, IV Location: R Hand  Remicade (Infliximab), Dose: 400  Infusion Start Time: 1597  Infusion Stop Time: 3312  Post Infusion IV Care: Observation period completed and Peripheral IV Discontinued  Discharge: Condition: Good, Destination: Home . AVS provided to patient.   Performed by:  Charlie Pitter, RN

## 2021-05-26 ENCOUNTER — Other Ambulatory Visit: Payer: Self-pay | Admitting: Cardiology

## 2021-05-27 ENCOUNTER — Other Ambulatory Visit: Payer: Self-pay

## 2021-05-28 ENCOUNTER — Other Ambulatory Visit: Payer: Self-pay | Admitting: Cardiology

## 2021-06-21 IMAGING — MG DIGITAL SCREENING BILAT W/ TOMO W/ CAD
8 series · 8 of 24 positions shown · non-contrast
Comparison: Previous exam(s).

CLINICAL DATA: Screening.

EXAM:
DIGITAL SCREENING BILATERAL MAMMOGRAM WITH TOMO AND CAD

[R CC synth-2D]
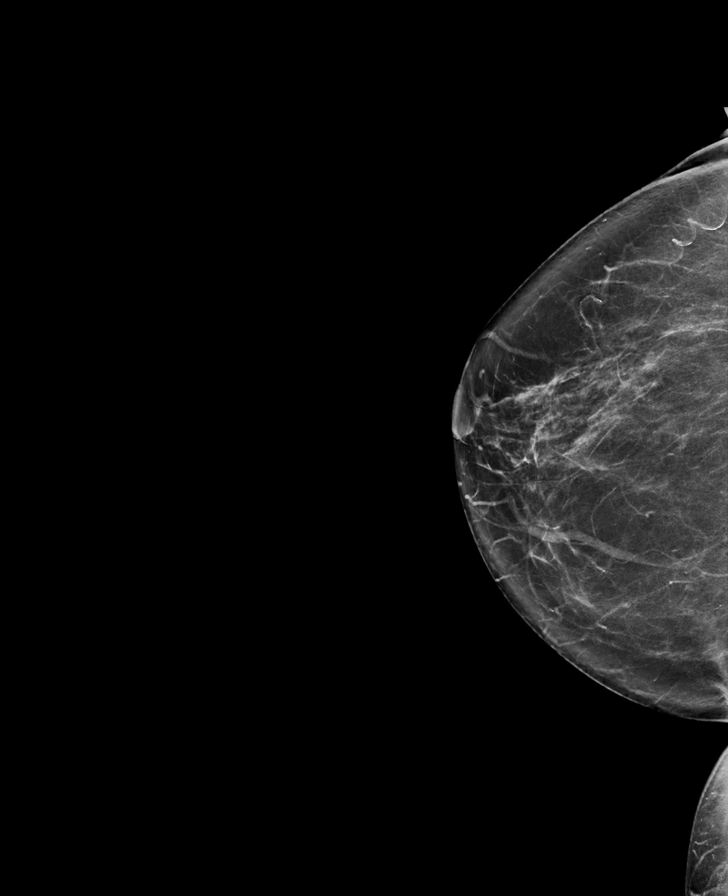

[L MLO synth-2D]
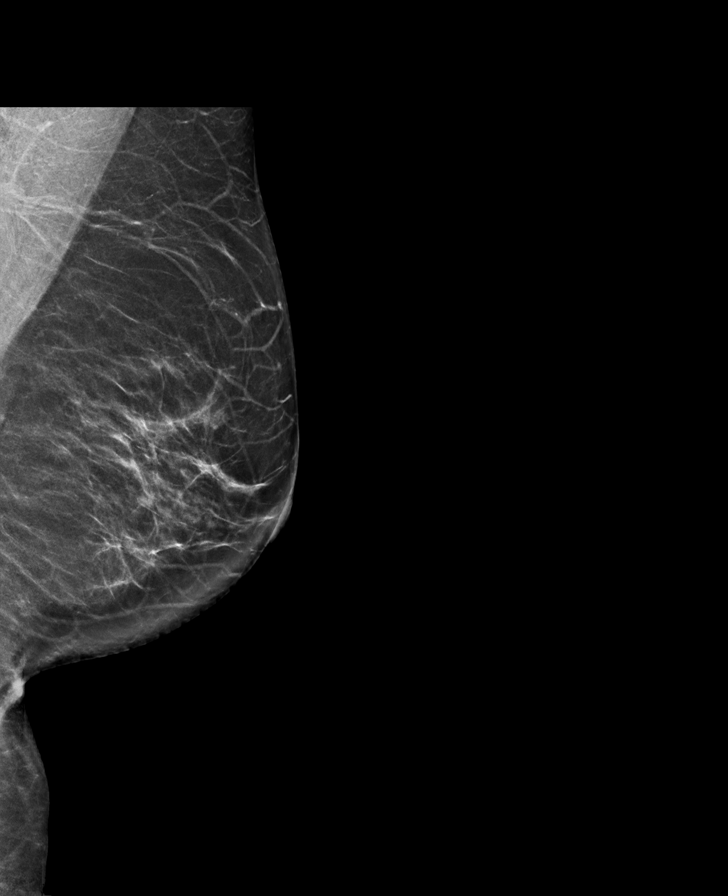

[R MLO synth-2D]
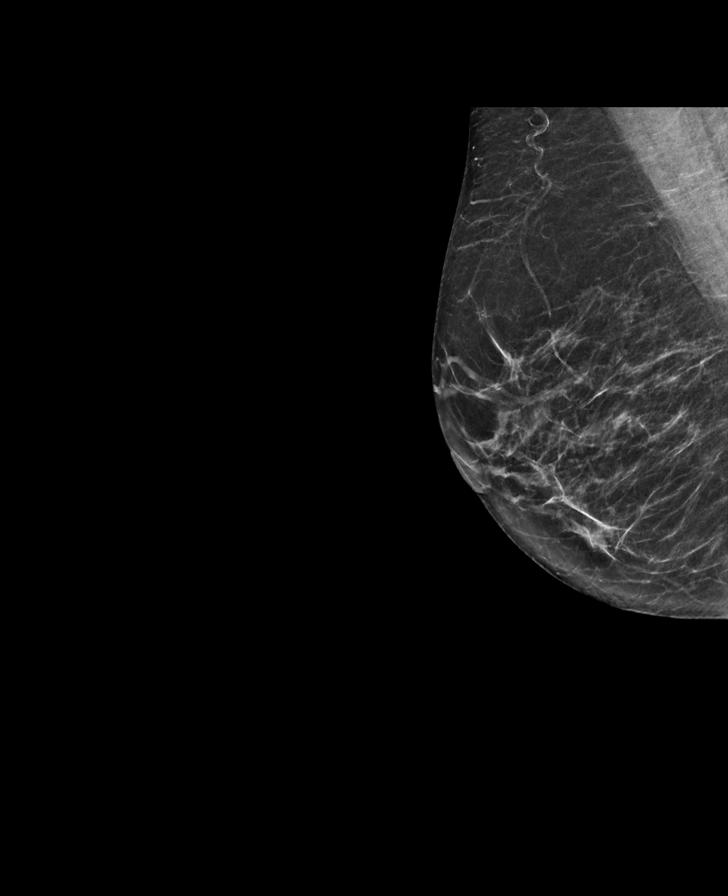

[L CC synth-2D]
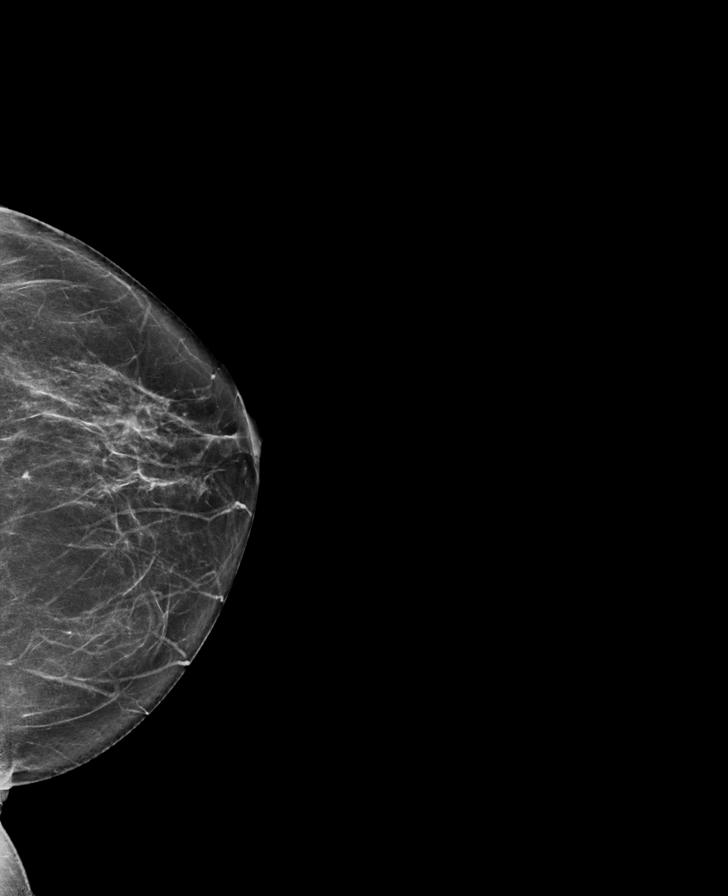

[R CC tomo · tomo slice 37/72.0]
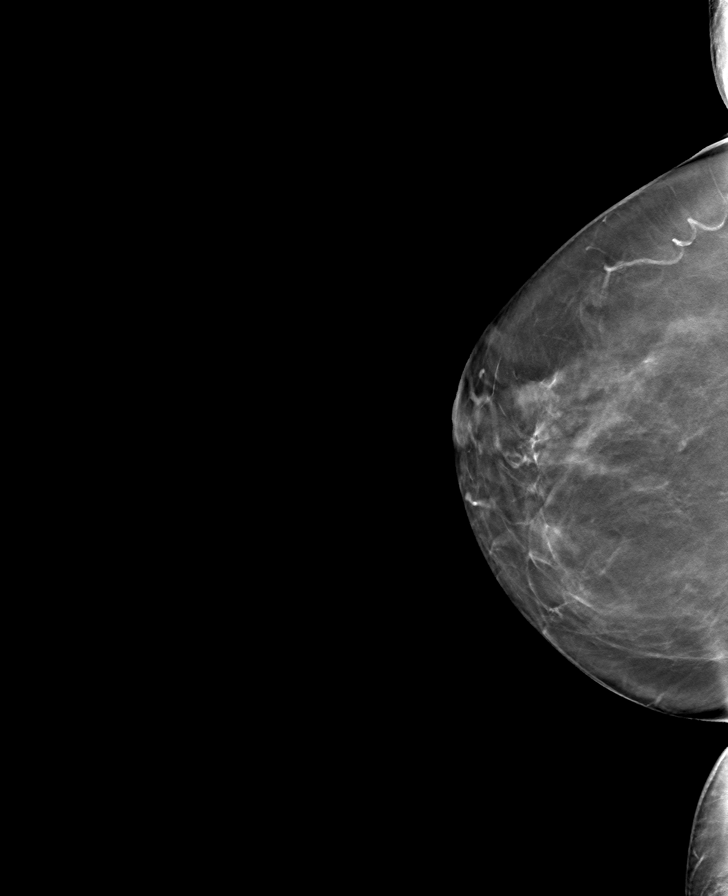

[L CC tomo · tomo slice 34/67.0]
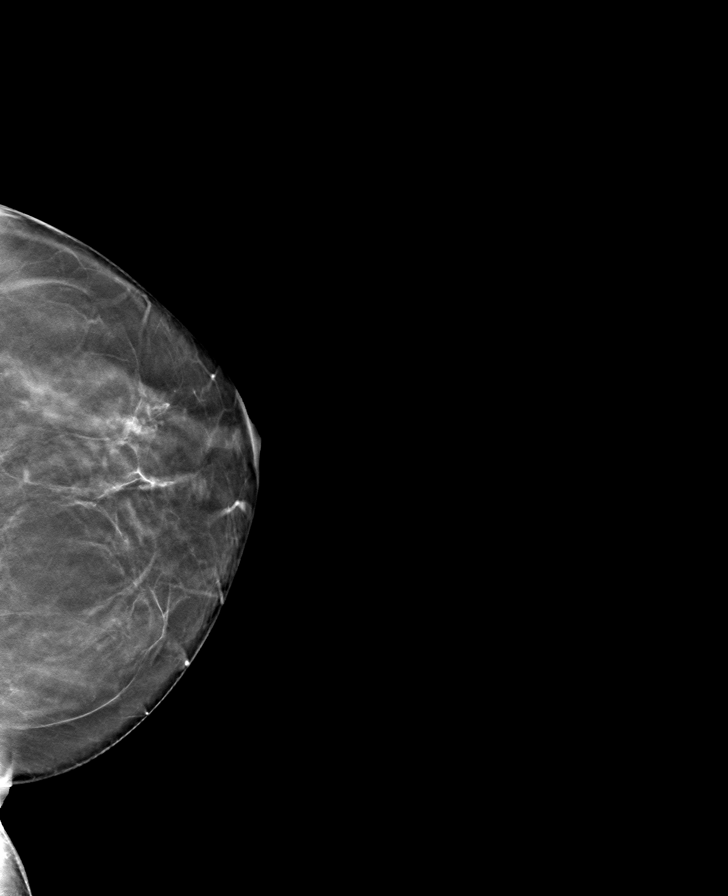

[L MLO tomo · tomo slice 36/71.0]
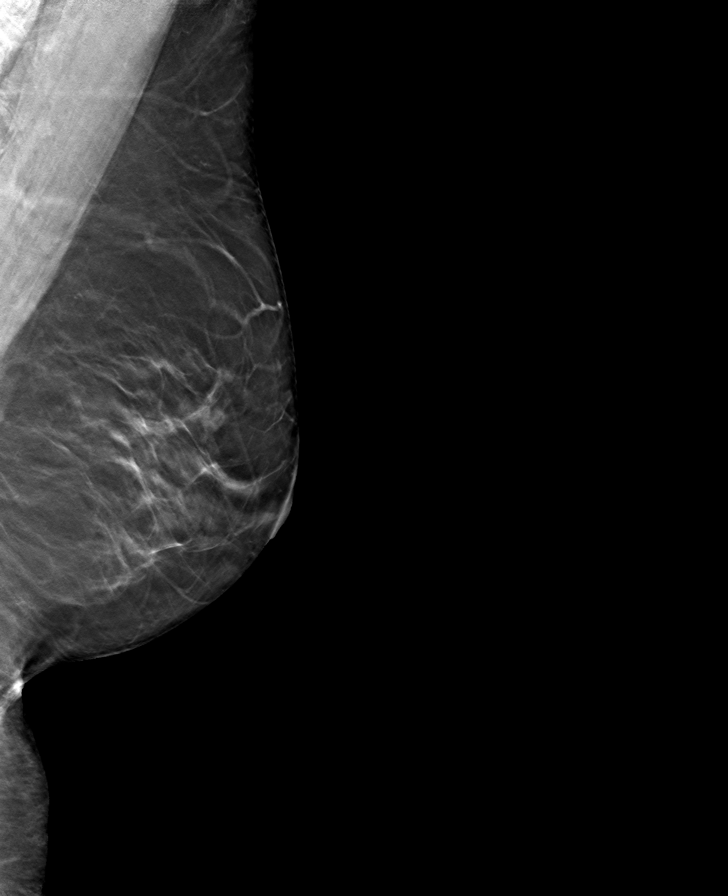

[R MLO tomo · tomo slice 32/63.0]
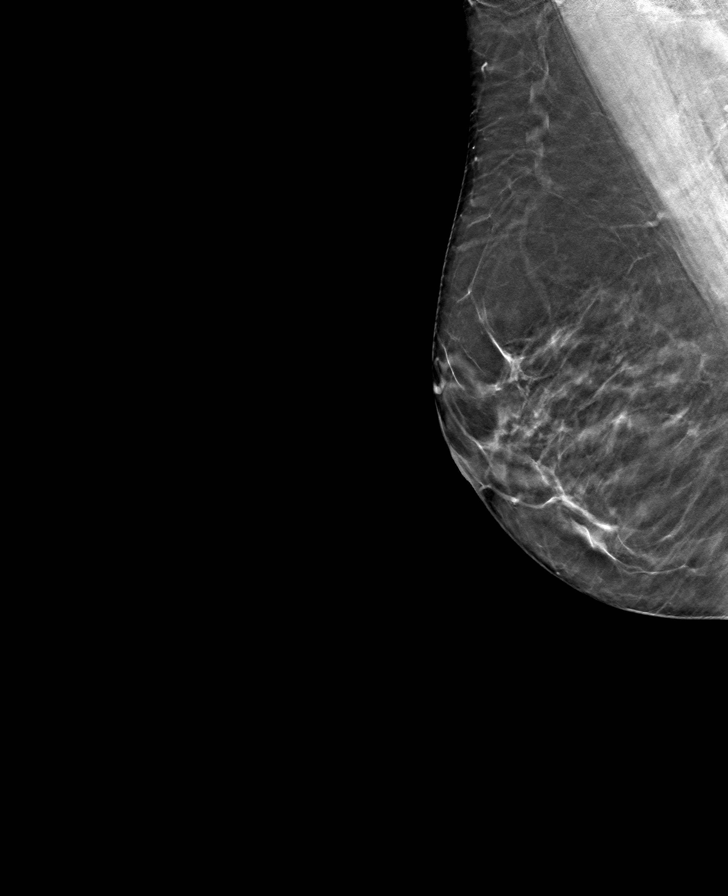

[8 of 24 positions shown; findings below may reference images not displayed]

ACR Breast Density Category b: There are scattered areas of
fibroglandular density.
FINDINGS: There are no findings suspicious for malignancy. Images were
processed with CAD.
IMPRESSION: No mammographic evidence of malignancy. A result letter of this
screening mammogram will be mailed directly to the patient.

RECOMMENDATION:
Screening mammogram in one year. (Code:[id number])

BI-RADS CATEGORY  1: Negative.

## 2021-07-01 ENCOUNTER — Ambulatory Visit: Payer: Medicare Other | Admitting: Cardiology

## 2021-07-10 ENCOUNTER — Telehealth: Payer: Self-pay | Admitting: Gastroenterology

## 2021-07-10 NOTE — Telephone Encounter (Signed)
Spoke with patient, she states that she just needed the phone number to the [facility] infusion center on [address] to reschedule an appt. I have provided her with their phone number. Pt had no other concerns at the end of the call.

## 2021-07-10 NOTE — Telephone Encounter (Signed)
Inbound call from patient requesting a call back to discuss infusions

## 2021-07-16 ENCOUNTER — Other Ambulatory Visit: Payer: Self-pay

## 2021-07-16 ENCOUNTER — Ambulatory Visit (INDEPENDENT_AMBULATORY_CARE_PROVIDER_SITE_OTHER): Payer: Medicare Other

## 2021-07-16 VITALS — BP 155/88 | HR 77 | Temp 98.2°F | Resp 16 | Ht 65.0 in | Wt 171.8 lb

## 2021-07-16 DIAGNOSIS — K5130 Ulcerative (chronic) rectosigmoiditis without complications: Secondary | ICD-10-CM

## 2021-07-16 MED ORDER — DIPHENHYDRAMINE HCL 25 MG PO CAPS: 25.0000 mg | ORAL_CAPSULE | Freq: Once | ORAL | Status: DC

## 2021-07-16 MED ORDER — SODIUM CHLORIDE 0.9 % IV SOLN: Freq: Once | INTRAVENOUS | Status: DC | PRN

## 2021-07-16 MED ORDER — METHYLPREDNISOLONE SODIUM SUCC 125 MG IJ SOLR: 125.0000 mg | Freq: Once | INTRAMUSCULAR | Status: DC | PRN

## 2021-07-16 MED ORDER — HEPARIN SOD (PORK) LOCK FLUSH 100 UNIT/ML IV SOLN: 250.0000 [IU] | Freq: Once | INTRAVENOUS | Status: DC | PRN

## 2021-07-16 MED ORDER — SODIUM CHLORIDE 0.9% FLUSH: 10.0000 mL | Freq: Once | INTRAVENOUS | Status: DC | PRN

## 2021-07-16 MED ORDER — HYDROCORTISONE NA SUCCINATE PF 100 MG IJ SOLR: 100.0000 mg | Freq: Once | INTRAMUSCULAR | Status: DC

## 2021-07-16 MED ORDER — ALTEPLASE 2 MG IJ SOLR: 2.0000 mg | Freq: Once | INTRAMUSCULAR | Status: DC | PRN

## 2021-07-16 MED ORDER — DIPHENHYDRAMINE HCL 50 MG/ML IJ SOLN: 50.0000 mg | Freq: Once | INTRAMUSCULAR | Status: DC | PRN

## 2021-07-16 MED ORDER — ANTICOAGULANT SODIUM CITRATE 4% (200MG/5ML) IV SOLN
5.0000 mL | Freq: Once | Status: DC | PRN
  Filled 2021-07-16: qty 5

## 2021-07-16 MED ORDER — ALBUTEROL SULFATE HFA 108 (90 BASE) MCG/ACT IN AERS: 2.0000 | INHALATION_SPRAY | Freq: Once | RESPIRATORY_TRACT | Status: DC | PRN

## 2021-07-16 MED ORDER — HEPARIN SOD (PORK) LOCK FLUSH 100 UNIT/ML IV SOLN
500.0000 [IU] | Freq: Once | INTRAVENOUS | Status: DC | PRN
Start: 2021-07-16 — End: 2021-07-16

## 2021-07-16 MED ORDER — SODIUM CHLORIDE 0.9% FLUSH: 3.0000 mL | Freq: Once | INTRAVENOUS | Status: DC | PRN

## 2021-07-16 MED ORDER — FAMOTIDINE IN NACL 20-0.9 MG/50ML-% IV SOLN: 20.0000 mg | Freq: Once | INTRAVENOUS | Status: DC | PRN

## 2021-07-16 MED ORDER — ACETAMINOPHEN 325 MG PO TABS: 650.0000 mg | ORAL_TABLET | Freq: Once | ORAL | Status: DC

## 2021-07-16 MED ORDER — EPINEPHRINE 0.3 MG/0.3ML IJ SOAJ: 0.3000 mg | Freq: Once | INTRAMUSCULAR | Status: DC | PRN

## 2021-07-16 MED ORDER — SODIUM CHLORIDE 0.9 % IV SOLN
5.0000 mg/kg | Freq: Once | INTRAVENOUS | Status: AC
  Administered 2021-07-16: 400 mg via INTRAVENOUS
  Filled 2021-07-16: qty 40

## 2021-07-16 NOTE — Progress Notes (Signed)
Diagnosis: Crohn's Disease  Provider:  Marshell Garfinkel, MD  Procedure: Infusion  IV Type: Peripheral, IV Location: R Hand  Remicade (Infliximab), Dose: 419m  Infusion Start Time: 14734 Infusion Stop Time: 1202  Post Infusion IV Care: Peripheral IV Discontinued  Discharge: Condition: Good, Destination: Home . AVS provided to patient.   Performed by:  Karmah Potocki, LSherlon Handing LPN

## 2021-08-17 ENCOUNTER — Ambulatory Visit: Admit: 2021-08-17 | Discharge status: Absent without leave | Payer: Medicare Other

## 2021-08-21 ENCOUNTER — Other Ambulatory Visit: Payer: Self-pay | Admitting: Cardiology

## 2021-08-24 ENCOUNTER — Other Ambulatory Visit: Payer: Self-pay | Admitting: Cardiology

## 2021-08-29 ENCOUNTER — Ambulatory Visit: Payer: Medicare Other | Admitting: Cardiology

## 2021-09-10 ENCOUNTER — Other Ambulatory Visit: Payer: Self-pay

## 2021-09-10 ENCOUNTER — Ambulatory Visit (INDEPENDENT_AMBULATORY_CARE_PROVIDER_SITE_OTHER): Payer: Medicare Other

## 2021-09-10 VITALS — BP 156/81 | HR 67 | Temp 97.8°F | Resp 16 | Ht 65.0 in | Wt 168.0 lb

## 2021-09-10 DIAGNOSIS — K5130 Ulcerative (chronic) rectosigmoiditis without complications: Secondary | ICD-10-CM

## 2021-09-10 MED ORDER — FAMOTIDINE IN NACL 20-0.9 MG/50ML-% IV SOLN: 20.0000 mg | Freq: Once | INTRAVENOUS | Status: DC | PRN

## 2021-09-10 MED ORDER — DIPHENHYDRAMINE HCL 25 MG PO CAPS
25.0000 mg | ORAL_CAPSULE | Freq: Once | ORAL | Status: AC
  Administered 2021-09-10: 10:00:00 25 mg via ORAL
  Filled 2021-09-10: qty 1

## 2021-09-10 MED ORDER — ACETAMINOPHEN 325 MG PO TABS
650.0000 mg | ORAL_TABLET | Freq: Once | ORAL | Status: AC
  Administered 2021-09-10: 10:00:00 650 mg via ORAL
  Filled 2021-09-10: qty 2

## 2021-09-10 MED ORDER — HYDROCORTISONE NA SUCCINATE PF 100 MG IJ SOLR
100.0000 mg | Freq: Once | INTRAMUSCULAR | Status: AC
  Administered 2021-09-10: 10:00:00 100 mg via INTRAVENOUS
  Filled 2021-09-10: qty 2

## 2021-09-10 MED ORDER — ALBUTEROL SULFATE HFA 108 (90 BASE) MCG/ACT IN AERS: 2.0000 | INHALATION_SPRAY | Freq: Once | RESPIRATORY_TRACT | Status: DC | PRN

## 2021-09-10 MED ORDER — SODIUM CHLORIDE 0.9 % IV SOLN: Freq: Once | INTRAVENOUS | Status: DC | PRN

## 2021-09-10 MED ORDER — DIPHENHYDRAMINE HCL 50 MG/ML IJ SOLN: 50.0000 mg | Freq: Once | INTRAMUSCULAR | Status: DC | PRN

## 2021-09-10 MED ORDER — SODIUM CHLORIDE 0.9 % IV SOLN
5.0000 mg/kg | Freq: Once | INTRAVENOUS | Status: AC
  Administered 2021-09-10: 10:00:00 400 mg via INTRAVENOUS
  Filled 2021-09-10 (×2): qty 40

## 2021-09-10 MED ORDER — EPINEPHRINE 0.3 MG/0.3ML IJ SOAJ: 0.3000 mg | Freq: Once | INTRAMUSCULAR | Status: DC | PRN

## 2021-09-10 MED ORDER — METHYLPREDNISOLONE SODIUM SUCC 125 MG IJ SOLR: 125.0000 mg | Freq: Once | INTRAMUSCULAR | Status: DC | PRN

## 2021-09-10 NOTE — Progress Notes (Addendum)
Diagnosis: Crohn's Disease  Provider:  Marshell Garfinkel, MD  Procedure: Infusion  IV Type: Peripheral, IV Location: R Hand  Remicade (Infliximab), Dose: 400mg   Infusion Start Time: 1019  Infusion Stop Time: 1130  Post Infusion IV Care: Peripheral IV Discontinued  Discharge: Condition: Good, Destination: Home . AVS provided to patient.   Performed by:  Delilah Mulgrew, Sherlon Handing, LPN

## 2021-10-20 ENCOUNTER — Other Ambulatory Visit: Payer: Self-pay | Admitting: Pharmacy Technician

## 2021-10-23 ENCOUNTER — Other Ambulatory Visit: Payer: Self-pay | Admitting: Family Medicine

## 2021-10-23 DIAGNOSIS — Z1231 Encounter for screening mammogram for malignant neoplasm of breast: Secondary | ICD-10-CM

## 2021-10-28 ENCOUNTER — Other Ambulatory Visit: Payer: Self-pay | Admitting: Cardiology

## 2021-11-05 ENCOUNTER — Ambulatory Visit (INDEPENDENT_AMBULATORY_CARE_PROVIDER_SITE_OTHER): Payer: Medicare Other | Admitting: *Deleted

## 2021-11-05 ENCOUNTER — Other Ambulatory Visit: Payer: Self-pay

## 2021-11-05 VITALS — BP 152/83 | HR 63 | Temp 97.6°F | Resp 18 | Ht 67.0 in | Wt 175.2 lb

## 2021-11-05 DIAGNOSIS — K5130 Ulcerative (chronic) rectosigmoiditis without complications: Secondary | ICD-10-CM

## 2021-11-05 MED ORDER — ALTEPLASE 2 MG IJ SOLR: 2.0000 mg | Freq: Once | INTRAMUSCULAR | Status: DC | PRN

## 2021-11-05 MED ORDER — HEPARIN SOD (PORK) LOCK FLUSH 100 UNIT/ML IV SOLN: 250.0000 [IU] | Freq: Once | INTRAVENOUS | Status: DC | PRN

## 2021-11-05 MED ORDER — SODIUM CHLORIDE 0.9% FLUSH: 10.0000 mL | Freq: Once | INTRAVENOUS | Status: DC | PRN

## 2021-11-05 MED ORDER — SODIUM CHLORIDE 0.9 % IV SOLN
5.0000 mg/kg | Freq: Once | INTRAVENOUS | Status: AC
  Administered 2021-11-05: 400 mg via INTRAVENOUS
  Filled 2021-11-05: qty 40

## 2021-11-05 MED ORDER — HEPARIN SOD (PORK) LOCK FLUSH 100 UNIT/ML IV SOLN: 500.0000 [IU] | Freq: Once | INTRAVENOUS | Status: DC | PRN

## 2021-11-05 MED ORDER — SODIUM CHLORIDE 0.9% FLUSH: 3.0000 mL | Freq: Once | INTRAVENOUS | Status: DC | PRN

## 2021-11-05 MED ORDER — METHYLPREDNISOLONE SODIUM SUCC 125 MG IJ SOLR: 125.0000 mg | Freq: Once | INTRAMUSCULAR | Status: DC | PRN

## 2021-11-05 MED ORDER — ANTICOAGULANT SODIUM CITRATE 4% (200MG/5ML) IV SOLN
5.0000 mL | Freq: Once | Status: DC | PRN
  Filled 2021-11-05: qty 5

## 2021-11-05 MED ORDER — FAMOTIDINE IN NACL 20-0.9 MG/50ML-% IV SOLN: 20.0000 mg | Freq: Once | INTRAVENOUS | Status: DC | PRN

## 2021-11-05 MED ORDER — EPINEPHRINE 0.3 MG/0.3ML IJ SOAJ: 0.3000 mg | Freq: Once | INTRAMUSCULAR | Status: DC | PRN

## 2021-11-05 MED ORDER — ALBUTEROL SULFATE HFA 108 (90 BASE) MCG/ACT IN AERS: 2.0000 | INHALATION_SPRAY | Freq: Once | RESPIRATORY_TRACT | Status: DC | PRN

## 2021-11-05 MED ORDER — HYDROCORTISONE NA SUCCINATE PF 100 MG IJ SOLR
100.0000 mg | Freq: Once | INTRAMUSCULAR | Status: AC
  Administered 2021-11-05: 100 mg via INTRAVENOUS
  Filled 2021-11-05: qty 2

## 2021-11-05 MED ORDER — DIPHENHYDRAMINE HCL 50 MG/ML IJ SOLN: 50.0000 mg | Freq: Once | INTRAMUSCULAR | Status: DC | PRN

## 2021-11-05 MED ORDER — ACETAMINOPHEN 325 MG PO TABS
650.0000 mg | ORAL_TABLET | Freq: Once | ORAL | Status: AC
  Administered 2021-11-05: 650 mg via ORAL
  Filled 2021-11-05: qty 2

## 2021-11-05 MED ORDER — DIPHENHYDRAMINE HCL 25 MG PO CAPS
25.0000 mg | ORAL_CAPSULE | Freq: Once | ORAL | Status: AC
  Administered 2021-11-05: 25 mg via ORAL
  Filled 2021-11-05: qty 1

## 2021-11-05 MED ORDER — SODIUM CHLORIDE 0.9 % IV SOLN: Freq: Once | INTRAVENOUS | Status: DC | PRN

## 2021-11-05 NOTE — Progress Notes (Signed)
Diagnosis: Crohn's Disease  Provider:  Marshell Garfinkel, MD  Procedure: Infusion  IV Type: Peripheral, IV Location: R Hand  Remicade (Infliximab), Dose: 400 mg  Infusion Start Time: 8115 am  Infusion Stop Time: 1225 pm  Post Infusion IV Care: Observation period completed  Discharge: Condition: Good, Destination: Home . AVS provided to patient.   Performed by:  Oren Beckmann, RN

## 2021-11-10 ENCOUNTER — Other Ambulatory Visit: Payer: Self-pay | Admitting: Pharmacy Technician

## 2021-11-12 ENCOUNTER — Ambulatory Visit: Payer: Medicare Other

## 2021-11-14 ENCOUNTER — Ambulatory Visit
Admission: RE | Admit: 2021-11-14 | Discharge: 2021-11-14 | Disposition: A | Discharge status: Home / Self Care | Payer: Medicare Other | Source: Ambulatory Visit | Attending: Family Medicine | Admitting: Family Medicine

## 2021-11-14 ENCOUNTER — Other Ambulatory Visit: Payer: Self-pay

## 2021-11-14 DIAGNOSIS — Z1231 Encounter for screening mammogram for malignant neoplasm of breast: Secondary | ICD-10-CM

## 2021-11-24 ENCOUNTER — Ambulatory Visit: Payer: Medicare Other | Admitting: Cardiology

## 2021-11-28 ENCOUNTER — Other Ambulatory Visit: Payer: Self-pay | Admitting: Cardiology

## 2021-12-12 ENCOUNTER — Other Ambulatory Visit (INDEPENDENT_AMBULATORY_CARE_PROVIDER_SITE_OTHER): Payer: Medicare Other

## 2021-12-12 ENCOUNTER — Ambulatory Visit (INDEPENDENT_AMBULATORY_CARE_PROVIDER_SITE_OTHER): Payer: Medicare Other | Admitting: Physician Assistant

## 2021-12-12 ENCOUNTER — Encounter: Payer: Self-pay | Admitting: Physician Assistant

## 2021-12-12 ENCOUNTER — Telehealth: Payer: Self-pay | Admitting: Physician Assistant

## 2021-12-12 VITALS — BP 119/77 | HR 75 | Ht 67.0 in | Wt 172.0 lb

## 2021-12-12 DIAGNOSIS — K51919 Ulcerative colitis, unspecified with unspecified complications: Secondary | ICD-10-CM

## 2021-12-12 DIAGNOSIS — L290 Pruritus ani: Secondary | ICD-10-CM

## 2021-12-12 DIAGNOSIS — Z7960 Long term (current) use of unspecified immunomodulators and immunosuppressants: Secondary | ICD-10-CM | POA: Diagnosis not present

## 2021-12-12 LAB — COMPREHENSIVE METABOLIC PANEL
ALT: 16 U/L (ref 0–35)
AST: 19 U/L (ref 0–37)
Albumin: 4.1 g/dL (ref 3.5–5.2)
Alkaline Phosphatase: 66 U/L (ref 39–117)
BUN: 14 mg/dL (ref 6–23)
CO2: 29 mEq/L (ref 19–32)
Calcium: 9.5 mg/dL (ref 8.4–10.5)
Chloride: 103 mEq/L (ref 96–112)
Creatinine, Ser: 0.87 mg/dL (ref 0.40–1.20)
GFR: 69.04 mL/min (ref >60.00)
Glucose, Bld: 94 mg/dL (ref 70–99)
Potassium: 3.6 mEq/L (ref 3.5–5.1)
Sodium: 139 mEq/L (ref 135–145)
Total Bilirubin: 1.3 mg/dL — ABNORMAL HIGH (ref 0.2–1.2)
Total Protein: 7 g/dL (ref 6.0–8.3)

## 2021-12-12 LAB — CBC WITH DIFFERENTIAL/PLATELET
Basophils Absolute: 0.1 10*3/uL (ref 0.0–0.1)
Basophils Relative: 0.8 % (ref 0.0–3.0)
Eosinophils Absolute: 0 10*3/uL (ref 0.0–0.7)
Eosinophils Relative: 0.3 % (ref 0.0–5.0)
HCT: 37.5 % (ref 36.0–46.0)
Hemoglobin: 12.6 g/dL (ref 12.0–15.0)
Lymphocytes Relative: 23.9 % (ref 12.0–46.0)
Lymphs Abs: 2.4 10*3/uL (ref 0.7–4.0)
MCHC: 33.5 g/dL (ref 30.0–36.0)
MCV: 89.9 fl (ref 78.0–100.0)
Monocytes Absolute: 0.8 10*3/uL (ref 0.1–1.0)
Monocytes Relative: 7.7 % (ref 3.0–12.0)
Neutro Abs: 6.7 10*3/uL (ref 1.4–7.7)
Neutrophils Relative %: 67.3 % (ref 43.0–77.0)
Platelets: 185 10*3/uL (ref 150.0–400.0)
RBC: 4.17 Mil/uL (ref 3.87–5.11)
RDW: 13.9 % (ref 11.5–15.5)
WBC: 10 10*3/uL (ref 4.0–10.5)

## 2021-12-12 NOTE — Progress Notes (Signed)
? ?Chief Complaint: History of ulcerative colitis coming in for complaint of rectal pain and irritation ? ?HPI: ?   Andrea [name] is a 66 year old female with a past medical history as listed below including ulcerative colitis, established with Dr. Loletha Carrow, who was referred to me by Via, Lennette Bihari, MD for a complaint of rectal pain and irritation.   ?   04/17/2021 patient establish care with Dr. Loletha Carrow for ulcerative colitis.  This initially diagnosed in March 2005.  Her last colonoscopy was in October 2021 with a 4 mm rectal hyperplastic polyp and sigmoid diverticuli.  She was stable on Infliximab plus low-dose Azathioprine.  At that time doing well. ?   Today, the patient presents to clinic and tells me that for "several weeks", she has been experiencing a rectal "irritation", when she passes a stool.  She started using a stool softener but still continues with this irritation as well as a very scant amount of light-colored bright red blood when wiping.  She tells me "this is not my UC", tells me she is having normal bowel movements and no abdominal pain.  Just describes a "itching/burning" around her rectum.  She has tried various ointments including Neosporin, Vaseline, Preparation H and most recently Aquaphor over the past week which seems to have helped a little bit. ?   Denies fever, chills, weight loss or symptoms that awaken her from sleep. ? ?Past Medical History:  ?Diagnosis Date  ? Arthritis   ? Chest discomfort   ? Disorder of immune system ([place])   ? Dyspnea on exertion   ? Hypercholesterolemia   ? Hypertension   ? Migraine   ? Non-ulcer dyspepsia   ? Osteopenia of spine   ? Peptic ulcer disease   ? Pneumonia   ? Ulcerative colitis ([place])   ? ? ?Past Surgical History:  ?Procedure Laterality Date  ? COLONOSCOPY    ? ESOPHAGOGASTRODUODENOSCOPY    ? hip replacement Bilateral   ? ? ?Current Outpatient Medications  ?Medication Sig Dispense Refill  ? atorvastatin (LIPITOR) 80 MG tablet Take 1 tablet (80 mg total) by  mouth daily. Please keep upcoming appt in October 2022 with Dr. Radford Pax before anymore refills. Thank you 90 tablet 0  ? azaTHIOprine (IMURAN) 50 MG tablet Take 50 mg by mouth daily.    ? ezetimibe (ZETIA) 10 MG tablet Take 1 tablet (10 mg total) by mouth daily. Please keep upcoming appt in April 2023 with Cardiologist before anymore refills. Thank you Final Attempt 30 tablet 1  ? fluticasone (FLONASE) 50 MCG/ACT nasal spray Place 2 sprays into both nostrils daily. 1 g 0  ? inFLIXimab (REMICADE) 100 MG injection Inject into the vein. Every 8 weeks    ? ipratropium (ATROVENT) 0.06 % nasal spray Place 2 sprays into both nostrils 4 (four) times daily. 15 mL 0  ? PARoxetine (PAXIL) 10 MG tablet Take 5 mg by mouth daily.    ? triamterene-hydrochlorothiazide (MAXZIDE-25) 37.5-25 MG tablet Take 1 tablet by mouth daily.    ? ?No current facility-administered medications for this visit.  ? ? ?Allergies as of 12/12/2021 - Review Complete 12/12/2021  ?Allergen Reaction Noted  ? Codeine Nausea And Vomiting and Nausea Only 01/11/2013  ? ? ?Family History  ?Problem Relation Age of Onset  ? CAD Mother 98  ? Heart attack Mother   ? Heart attack Father 93  ? CAD Brother 38  ? Skin cancer Brother   ? Other Son   ?  gluten sensitivity  ? Colon cancer Neg Hx   ? ? ?Social History  ? ?Socioeconomic History  ? Marital status: Divorced  ?  Spouse name: Not on file  ? Number of children: 3  ? Years of education: Not on file  ? Highest education level: Not on file  ?Occupational History  ? Occupation: retired  ?Tobacco Use  ? Smoking status: Never  ? Smokeless tobacco: Never  ?Vaping Use  ? Vaping Use: Never used  ?Substance and Sexual Activity  ? Alcohol use: No  ? Drug use: No  ? Sexual activity: Yes  ?Other Topics Concern  ? Not on file  ?Social History Narrative  ? Not on file  ? ?Social Determinants of Health  ? ?Financial Resource Strain: Not on file  ?Food Insecurity: Not on file  ?Transportation Needs: Not on file  ?Physical  Activity: Not on file  ?Stress: Not on file  ?Social Connections: Not on file  ?Intimate Partner Violence: Not on file  ? ? ?Review of Systems:    ?Constitutional: No weight loss, fever or chills ?Cardiovascular: No chest pain ?Respiratory: No SOB  ?Gastrointestinal: See HPI and otherwise negative ? ? Physical Exam:  ?Vital signs: ?BP 119/77   Pulse 75   Ht 5' 7"  (1.702 m)   Wt 172 lb (78 kg)   SpO2 97%   BMI 26.94 kg/m?   ? ?Constitutional:   Pleasant Caucasian female appears to be in NAD, Well developed, Well nourished, alert and cooperative ?Respiratory: Respirations even and unlabored. Lungs clear to auscultation bilaterally.   No wheezes, crackles, or rhonchi.  ?Cardiovascular: Normal S1, S2. No MRG. Regular rate and rhythm. No peripheral edema, cyanosis or pallor.  ?Gastrointestinal:  Soft, nondistended, nontender. No rebound or guarding. Normal bowel sounds. No appreciable masses or hepatomegaly. ?Rectal: External: Visible erythema; internal: Some rectal tissue with small white spots on the right side, tender to the patient; Anoscopy: Uncomfortable the patient, no visible fissure or other abnormality ?Psychiatric: Demonstrates good judgement and reason without abnormal affect or behaviors. ? ?RELEVANT LABS AND IMAGING: ?CBC ?   ?Component Value Date/Time  ? WBC 4.2 01/04/2021 1724  ? RBC 4.49 01/04/2021 1724  ? HGB 13.6 01/04/2021 1724  ? HCT 39.4 01/04/2021 1724  ? PLT 194 01/04/2021 1724  ? MCV 87.8 01/04/2021 1724  ? MCH 30.3 01/04/2021 1724  ? MCHC 34.5 01/04/2021 1724  ? RDW 14.1 01/04/2021 1724  ? LYMPHSABS 1.0 01/04/2021 1724  ? MONOABS 0.5 01/04/2021 1724  ? EOSABS 0.0 01/04/2021 1724  ? BASOSABS 0.0 01/04/2021 1724  ? ? ?CMP  ?   ?Component Value Date/Time  ? NA 135 01/04/2021 1724  ? NA 142 07/06/2019 1601  ? K 3.4 (L) 01/04/2021 1724  ? CL 102 01/04/2021 1724  ? CO2 24 01/04/2021 1724  ? GLUCOSE 130 (H) 01/04/2021 1724  ? BUN 11 01/04/2021 1724  ? BUN 14 07/06/2019 1601  ? CREATININE 0.59  01/04/2021 1724  ? CALCIUM 9.4 01/04/2021 1724  ? PROT 7.4 01/04/2021 1724  ? PROT 6.8 10/10/2019 1030  ? ALBUMIN 3.8 01/04/2021 1724  ? ALBUMIN 4.4 10/10/2019 1030  ? AST 28 01/04/2021 1724  ? ALT 23 01/04/2021 1724  ? ALKPHOS 42 01/04/2021 1724  ? BILITOT 0.7 01/04/2021 1724  ? BILITOT 0.6 10/10/2019 1030  ? GFRNONAA >60 01/04/2021 1724  ? GFRAA 84 07/06/2019 1601  ? ? ?Assessment: ?1.  Rectal pain/irritation: Abnormal rectal exam with question of possible AIN versus just dermal  irritation ?2.  History of ulcerative colitis: Maintained on Infliximab and low-dose Azathioprine, seems to be well controlled ? ?Plan: ?1.  Rectal exam also completed with Dr. Loletha Carrow at time of patient's visit.  Most likely just irritation at the rectum, but the patient has had abnormal Pap smears in the past.  Discussed possibility of AIN.  We referred her to general surgery for further evaluation in case of need for biopsy. ?2.  Patient instructed to use a moist towellette to wipe and dab or air dry afterwards.  Would recommend she use a barrier cream such as Desitin. ?3.  Ordered CBC and CMP today for drug monitoring. ?4.  Patient to follow with [place] as needed. ? ?Ellouise Newer, PA-C ?[facility] ?12/12/2021, 10:07 AM ? ?Cc: Dineen Kid, MD  ?

## 2021-12-12 NOTE — Telephone Encounter (Signed)
Patient is returning your call to discuss lab results. ?

## 2021-12-12 NOTE — Patient Instructions (Addendum)
Pruritis Ani treatment: ? ?Citrucel fiber one tablespoon once daily. ? ?Avoid coffee, alcohol, tomatoes, citrus fruits. ? ?Avoid any wipe or ointment with hydrocortisone. ? ?Use un-medicated , moist, flushable toilet wipe ("toddler wipe") at first after bowel movement, then blot dry with soft cloth instead of toilet paper.   ? ?A small amount of Zinc Oxide (desitin, balmex) cream can be applied afterwards to protect the sensitive skin near the anal canal. ? ?Referral placed to [facility].  ? ?Your provider has requested that you go to the basement level for lab work before leaving today. Press "B" on the elevator. The lab is located at the first door on the left as you exit the elevator. ? ?If you are age 54 or older, your body mass index should be between 23-30. Your Body mass index is 26.94 kg/m?Marland Kitchen If this is out of the aforementioned range listed, please consider follow up with your Primary Care Provider. ? ?If you are age 15 or younger, your body mass index should be between 19-25. Your Body mass index is 26.94 kg/m?Marland Kitchen If this is out of the aformentioned range listed, please consider follow up with your Primary Care Provider.  ? ?________________________________________________________ ? ?The [facility] providers would like to encourage you to use [facility] to communicate with providers for non-urgent requests or questions.  Due to long hold times on the telephone, sending your provider a message by [facility] may be a faster and more efficient way to get a response.  Please allow 48 business hours for a response.  Please remember that this is for non-urgent requests.  ?_______________________________________________________ ? ?

## 2021-12-16 NOTE — Progress Notes (Signed)
____________________________________________________________ ? ?Attending physician addendum: ? ?Thank you for sending this case to me and for having me see her with you in clinic. ?I have reviewed the entire note and agree with the plan as we discussed. ? ?Distal anus and perianal area looks normal except for some nonspecific punctate white spots right side of the anoderm, which may be normal but could be AIN.  She seems to have healthy appearing tissue otherwise.  Experiencing symptoms of local irritation with burning and itching, barrier cream recommended and evaluation by colorectal surgery. ? ?Wilfrid Lund, MD ? ?____________________________________________________________ ? ?

## 2021-12-31 ENCOUNTER — Ambulatory Visit (INDEPENDENT_AMBULATORY_CARE_PROVIDER_SITE_OTHER): Payer: Medicare Other | Admitting: *Deleted

## 2021-12-31 VITALS — BP 165/81 | HR 68 | Temp 97.9°F | Resp 20 | Ht 65.0 in | Wt 172.4 lb

## 2021-12-31 DIAGNOSIS — K5130 Ulcerative (chronic) rectosigmoiditis without complications: Secondary | ICD-10-CM

## 2021-12-31 MED ORDER — ACETAMINOPHEN 325 MG PO TABS
650.0000 mg | ORAL_TABLET | Freq: Once | ORAL | Status: AC
  Administered 2021-12-31: 650 mg via ORAL
  Filled 2021-12-31: qty 2

## 2021-12-31 MED ORDER — SODIUM CHLORIDE 0.9 % IV SOLN
5.0000 mg/kg | Freq: Once | INTRAVENOUS | Status: AC
  Administered 2021-12-31: 400 mg via INTRAVENOUS
  Filled 2021-12-31: qty 40

## 2021-12-31 MED ORDER — HYDROCORTISONE NA SUCCINATE PF 100 MG IJ SOLR
100.0000 mg | Freq: Once | INTRAMUSCULAR | Status: AC
  Administered 2021-12-31: 100 mg via INTRAVENOUS
  Filled 2021-12-31: qty 2

## 2021-12-31 MED ORDER — DIPHENHYDRAMINE HCL 25 MG PO CAPS
25.0000 mg | ORAL_CAPSULE | Freq: Once | ORAL | Status: AC
  Administered 2021-12-31: 25 mg via ORAL
  Filled 2021-12-31: qty 1

## 2021-12-31 NOTE — Progress Notes (Signed)
Diagnosis: Indeterminate Colitis ? ?Provider:  Marshell Garfinkel, MD ? ?Procedure: Infusion ? ?IV Type: Peripheral, IV Location: L Hand ? ?Remicade (Infliximab), , Dose: 400 mg ? ?Infusion Start Time: 1021 am ? ?Infusion Stop Time: 1135 am ? ?Post Infusion IV Care: Observation period completed and Peripheral IV Discontinued ? ?Discharge: Condition: Good, Destination: Home . AVS provided to patient.  ? ?Performed by:  Oren Beckmann, RN  ?  ?

## 2022-01-03 ENCOUNTER — Other Ambulatory Visit: Payer: Self-pay | Admitting: Cardiology

## 2022-01-08 NOTE — Progress Notes (Signed)
?Cardiology Office Note:   ? ?Date:  01/12/2022  ? ?ID:  Andrea [name], DOB [birth year], MRN [id number] ? ?PCP:  Andrea Kid, MD ?  ?[facility] Providers ?Cardiologist:  None    ? ?Referring MD: Andrea Kid, MD  ? ?Chief Complaint: follow-up chest pain ? ?History of Present Illness:   ? ?Andrea [name] is a very pleasant 66 y.o. female with a hx of chest pain, hypertension, hyperlipidemia, ulcerative colitis, and PUD.  ? ?She was referred to theology by PCP for evaluation of chest pain and seen on 07/06/2019 by Dr. Radford [name].  She reported pressure and burning sensation in her chest when she would go outside on a cold day with any activity.  She thought it was just cold air and ignored it.  Then in warmer months she had burning sensation in chest when walking up a hill accompanied by shortness of breath. Coronary CTA revealed calcium score of 190, 89th percentile for age and sex matched control, mild nonobstructive CAD in the proximal portion of LAD, RCA, and OM1.  Lexiscan Myoview was ordered to rule out ischemia and it revealed no evidence of prior infarct or ischemia.  Echo 07/18/2019 revealed LVEF 60 to 65%, no LVH, normal RV, normal valve function.  Advised to follow-up as needed. ? ?Today, she is here alone for follow-up.  Reports she is having increasing episodes of midsternal chest tightness that occur when she is walking accompanied by shortness of breath.  She denies N/V, diaphoresis.  No pain at rest. Feels that symptoms have intensified since last office visit. She denies fatigue, palpitations, melena, hematuria, hemoptysis, diaphoresis, weakness, presyncope, syncope, orthopnea, and PND. Has mild leg edema at the end of most days that resolves over night.  Was less active during pandemic and has gradually been increasing physical activity.  Injured herself attempting to play pickle ball recently. ? ? ?Past Medical History:  ?Diagnosis Date  ? Arthritis   ? Chest discomfort   ? Disorder of immune system  ([place])   ? Dyspnea on exertion   ? Hypercholesterolemia   ? Hypertension   ? Migraine   ? Non-ulcer dyspepsia   ? Osteopenia of spine   ? Peptic ulcer disease   ? Pneumonia   ? Ulcerative colitis ([place])   ? ? ?Past Surgical History:  ?Procedure Laterality Date  ? COLONOSCOPY    ? ESOPHAGOGASTRODUODENOSCOPY    ? hip replacement Bilateral   ? ? ?Current Medications: ?Current Meds  ?Medication Sig  ? aspirin EC 81 MG tablet Take 1 tablet (81 mg total) by mouth daily. Swallow whole.  ? atorvastatin (LIPITOR) 80 MG tablet TAKE ONE TABLET BY MOUTH DAILY  ? azaTHIOprine (IMURAN) 50 MG tablet Take 50 mg by mouth daily.  ? ezetimibe (ZETIA) 10 MG tablet Take 1 tablet (10 mg total) by mouth daily. Please keep upcoming appt in April 2023 with Cardiologist before anymore refills. Thank you Final Attempt  ? fluticasone (FLONASE) 50 MCG/ACT nasal spray Place 2 sprays into both nostrils daily.  ? inFLIXimab (REMICADE) 100 MG injection Inject into the vein. Every 8 weeks  ? ipratropium (ATROVENT) 0.06 % nasal spray Place 2 sprays into both nostrils 4 (four) times daily.  ? nitroGLYCERIN (NITROSTAT) 0.4 MG SL tablet Place 1 tablet (0.4 mg total) under the tongue every 5 (five) minutes as needed for chest pain.  ? PARoxetine (PAXIL) 10 MG tablet Take 5 mg by mouth daily.  ? triamterene-hydrochlorothiazide (MAXZIDE-25) 37.5-25 MG tablet Take 1 tablet  by mouth daily.  ?  ? ?Allergies:   Codeine  ? ?Social History  ? ?Socioeconomic History  ? Marital status: Divorced  ?  Spouse name: Not on file  ? Number of children: 3  ? Years of education: Not on file  ? Highest education level: Not on file  ?Occupational History  ? Occupation: retired  ?Tobacco Use  ? Smoking status: Never  ? Smokeless tobacco: Never  ?Vaping Use  ? Vaping Use: Never used  ?Substance and Sexual Activity  ? Alcohol use: No  ? Drug use: No  ? Sexual activity: Yes  ?Other Topics Concern  ? Not on file  ?Social History Narrative  ? Not on file  ? ?Social Determinants of  Health  ? ?Financial Resource Strain: Not on file  ?Food Insecurity: Not on file  ?Transportation Needs: Not on file  ?Physical Activity: Not on file  ?Stress: Not on file  ?Social Connections: Not on file  ?  ? ?Family History: ?The patient's family history includes CAD (age of onset: 98) in her brother; CAD (age of onset: 32) in her mother; Heart attack in her mother; Heart attack (age of onset: 46) in her father; Other in her son; Skin cancer in her brother. There is no history of Colon cancer. ? ?ROS:   ?Please see the history of present illness.    ?+ chest tightness ?All other systems reviewed and are negative. ? ?Labs/Other Studies Reviewed:   ? ?The following studies were reviewed today: ? ?Lexiscan myoview 08/28/19 ? ?Nuclear stress EF: 64%. ?There was no ST segment deviation noted during stress. ?The study is normal. ?This is a low risk study. ?The left ventricular ejection fraction is normal (55-65%). ?  ?Normal pharmacologic nuclear stress test with no evidence for prior infarct or ischemia.  LVEF 64%. ? ?Echo 07/18/19 ? ?Left Ventricle: Left ventricular ejection fraction, by visual estimation,  ?is 60 to 65%. The left ventricle has normal function. No evidence of left  ?ventricular regional wall motion abnormalities. There is no left  ?ventricular hypertrophy. Normal left  ?ventricular size. Spectral Doppler shows Left ventricular diastolic  ?parameters were normal pattern of LV diastolic filling.  ?Right Ventricle: The right ventricular size is normal. No increase in  ?right ventricular wall thickness. Global RV systolic function is has  ?normal systolic function. The tricuspid regurgitant velocity is 2.40 m/s,  ?and with an assumed right atrial pressure  ? of 3 mmHg, the estimated right ventricular systolic pressure is normal at  ?25.9 mmHg.  ?Left Atrium: Left atrial size was normal in size.  ?Right Atrium: Right atrial size was mildly dilated  ?Pericardium: Trivial pericardial effusion is present.   ?Mitral Valve: The mitral valve is normal in structure. No evidence of  ?mitral valve stenosis by observation. No evidence of mitral valve  ?regurgitation.  ?Tricuspid Valve: The tricuspid valve is normal in structure. Tricuspid  ?valve regurgitation is trivial by color flow Doppler.  ?Aortic Valve: The aortic valve has an indeterminant number of cusps.  ?Aortic valve regurgitation was not visualized by color flow Doppler. The  ?aortic valve is structurally normal, with no evidence of sclerosis or  ?stenosis.  ?Pulmonic Valve: The pulmonic valve was not well visualized. Pulmonic valve  ?regurgitation is not visualized by color flow Doppler.  ?Aorta: The aortic root, ascending aorta and aortic arch are all  ?structurally normal, with no evidence of dilitation or obstruction.  ?Pulmonary Artery: The pulmonary artery is not well seen.  ?Venous: The  inferior vena cava is normal in size with greater than 50%  ?respiratory variability, suggesting right atrial pressure of 3 mmHg.  ?IAS/Shunts: No atrial level shunt detected by color flow Doppler. No  ?ventricular septal defect is seen or detected. There is no evidence of an  ?atrial septal defect.  ? ?Coronary CTA 08/09/19 ? ?1. Coronary calcium score of 190. This was 73 percentile for age and ?sex matched control. ?  ?2. Normal coronary origin with right dominance. ?  ?3. CAD-RADS 2. Mild non-obstructive CAD (25-49%) in the proximal ?portion of LAD, RCA and OM1. Consider non-atherosclerotic causes of ?chest pain. Consider preventive therapy and risk factor ?modification. ? ?Recent Labs: ?12/12/2021: ALT 16; BUN 14; Creatinine, Ser 0.87; Hemoglobin 12.6; Platelets 185.0; Potassium 3.6; Sodium 139  ?Recent Lipid Panel ?   ?Component Value Date/Time  ? CHOL 141 11/23/2019 0735  ? TRIG 115 11/23/2019 0735  ? HDL 68 11/23/2019 0735  ? CHOLHDL 2.1 11/23/2019 0735  ? [place] 53 11/23/2019 0735  ? ? ? ?Risk Assessment/Calculations:   ?  ? ? ?Physical Exam:   ? ?VS:  BP 136/82    Pulse 75   Ht 5' 5"  (1.651 m)   Wt 173 lb (78.5 kg)   SpO2 99%   BMI 28.79 kg/m?    ? ?Wt Readings from Last 3 Encounters:  ?01/12/22 173 lb (78.5 kg)  ?12/31/21 172 lb 6.4 oz (78.2 kg)  ?12/12/21 172 lb (78

## 2022-01-08 NOTE — H&P (View-Only) (Signed)
?Cardiology Office Note:   ? ?Date:  01/12/2022  ? ?ID:  [name], DOB 02/17/1956, MRN [id number] ? ?PCP:  Andrea Kid, MD ?  ?[facility] Providers ?Cardiologist:  None    ? ?Referring MD: Andrea Kid, MD  ? ?Chief Complaint: follow-up chest pain ? ?History of Present Illness:   ? ?Andrea [name] is a very pleasant 66 y.o. female with a hx of chest pain, hypertension, hyperlipidemia, ulcerative colitis, and PUD.  ? ?She was referred to theology by PCP for evaluation of chest pain and seen on 07/06/2019 by Dr. Radford [name].  She reported pressure and burning sensation in her chest when she would go outside on a cold day with any activity.  She thought it was just cold air and ignored it.  Then in warmer months she had burning sensation in chest when walking up a hill accompanied by shortness of breath. Coronary CTA revealed calcium score of 190, 89th percentile for age and sex matched control, mild nonobstructive CAD in the proximal portion of LAD, RCA, and OM1.  Lexiscan Myoview was ordered to rule out ischemia and it revealed no evidence of prior infarct or ischemia.  Echo 07/18/2019 revealed LVEF 60 to 65%, no LVH, normal RV, normal valve function.  Advised to follow-up as needed. ? ?Today, she is here alone for follow-up.  Reports she is having increasing episodes of midsternal chest tightness that occur when she is walking accompanied by shortness of breath.  She denies N/V, diaphoresis.  No pain at rest. Feels that symptoms have intensified since last office visit. She denies fatigue, palpitations, melena, hematuria, hemoptysis, diaphoresis, weakness, presyncope, syncope, orthopnea, and PND. Has mild leg edema at the end of most days that resolves over night.  Was less active during pandemic and has gradually been increasing physical activity.  Injured herself attempting to play pickle ball recently. ? ? ?Past Medical History:  ?Diagnosis Date  ? Arthritis   ? Chest discomfort   ? Disorder of immune system  ([place])   ? Dyspnea on exertion   ? Hypercholesterolemia   ? Hypertension   ? Migraine   ? Non-ulcer dyspepsia   ? Osteopenia of spine   ? Peptic ulcer disease   ? Pneumonia   ? Ulcerative colitis ([place])   ? ? ?Past Surgical History:  ?Procedure Laterality Date  ? COLONOSCOPY    ? ESOPHAGOGASTRODUODENOSCOPY    ? hip replacement Bilateral   ? ? ?Current Medications: ?Current Meds  ?Medication Sig  ? aspirin EC 81 MG tablet Take 1 tablet (81 mg total) by mouth daily. Swallow whole.  ? atorvastatin (LIPITOR) 80 MG tablet TAKE ONE TABLET BY MOUTH DAILY  ? azaTHIOprine (IMURAN) 50 MG tablet Take 50 mg by mouth daily.  ? ezetimibe (ZETIA) 10 MG tablet Take 1 tablet (10 mg total) by mouth daily. Please keep upcoming appt in April 2023 with Cardiologist before anymore refills. Thank you Final Attempt  ? fluticasone (FLONASE) 50 MCG/ACT nasal spray Place 2 sprays into both nostrils daily.  ? inFLIXimab (REMICADE) 100 MG injection Inject into the vein. Every 8 weeks  ? ipratropium (ATROVENT) 0.06 % nasal spray Place 2 sprays into both nostrils 4 (four) times daily.  ? nitroGLYCERIN (NITROSTAT) 0.4 MG SL tablet Place 1 tablet (0.4 mg total) under the tongue every 5 (five) minutes as needed for chest pain.  ? PARoxetine (PAXIL) 10 MG tablet Take 5 mg by mouth daily.  ? triamterene-hydrochlorothiazide (MAXZIDE-25) 37.5-25 MG tablet Take 1 tablet  by mouth daily.  ?  ? ?Allergies:   Codeine  ? ?Social History  ? ?Socioeconomic History  ? Marital status: Divorced  ?  Spouse name: Not on file  ? Number of children: 3  ? Years of education: Not on file  ? Highest education level: Not on file  ?Occupational History  ? Occupation: retired  ?Tobacco Use  ? Smoking status: Never  ? Smokeless tobacco: Never  ?Vaping Use  ? Vaping Use: Never used  ?Substance and Sexual Activity  ? Alcohol use: No  ? Drug use: No  ? Sexual activity: Yes  ?Other Topics Concern  ? Not on file  ?Social History Narrative  ? Not on file  ? ?Social Determinants of  Health  ? ?Financial Resource Strain: Not on file  ?Food Insecurity: Not on file  ?Transportation Needs: Not on file  ?Physical Activity: Not on file  ?Stress: Not on file  ?Social Connections: Not on file  ?  ? ?Family History: ?The patient's family history includes CAD (age of onset: 48) in her brother; CAD (age of onset: 62) in her mother; Heart attack in her mother; Heart attack (age of onset: 8) in her father; Other in her son; Skin cancer in her brother. There is no history of Colon cancer. ? ?ROS:   ?Please see the history of present illness.    ?+ chest tightness ?All other systems reviewed and are negative. ? ?Labs/Other Studies Reviewed:   ? ?The following studies were reviewed today: ? ?Lexiscan myoview 08/28/19 ? ?Nuclear stress EF: 64%. ?There was no ST segment deviation noted during stress. ?The study is normal. ?This is a low risk study. ?The left ventricular ejection fraction is normal (55-65%). ?  ?Normal pharmacologic nuclear stress test with no evidence for prior infarct or ischemia.  LVEF 64%. ? ?Echo 07/18/19 ? ?Left Ventricle: Left ventricular ejection fraction, by visual estimation,  ?is 60 to 65%. The left ventricle has normal function. No evidence of left  ?ventricular regional wall motion abnormalities. There is no left  ?ventricular hypertrophy. Normal left  ?ventricular size. Spectral Doppler shows Left ventricular diastolic  ?parameters were normal pattern of LV diastolic filling.  ?Right Ventricle: The right ventricular size is normal. No increase in  ?right ventricular wall thickness. Global RV systolic function is has  ?normal systolic function. The tricuspid regurgitant velocity is 2.40 m/s,  ?and with an assumed right atrial pressure  ? of 3 mmHg, the estimated right ventricular systolic pressure is normal at  ?25.9 mmHg.  ?Left Atrium: Left atrial size was normal in size.  ?Right Atrium: Right atrial size was mildly dilated  ?Pericardium: Trivial pericardial effusion is present.   ?Mitral Valve: The mitral valve is normal in structure. No evidence of  ?mitral valve stenosis by observation. No evidence of mitral valve  ?regurgitation.  ?Tricuspid Valve: The tricuspid valve is normal in structure. Tricuspid  ?valve regurgitation is trivial by color flow Doppler.  ?Aortic Valve: The aortic valve has an indeterminant number of cusps.  ?Aortic valve regurgitation was not visualized by color flow Doppler. The  ?aortic valve is structurally normal, with no evidence of sclerosis or  ?stenosis.  ?Pulmonic Valve: The pulmonic valve was not well visualized. Pulmonic valve  ?regurgitation is not visualized by color flow Doppler.  ?Aorta: The aortic root, ascending aorta and aortic arch are all  ?structurally normal, with no evidence of dilitation or obstruction.  ?Pulmonary Artery: The pulmonary artery is not well seen.  ?Venous: The  inferior vena cava is normal in size with greater than 50%  ?respiratory variability, suggesting right atrial pressure of 3 mmHg.  ?IAS/Shunts: No atrial level shunt detected by color flow Doppler. No  ?ventricular septal defect is seen or detected. There is no evidence of an  ?atrial septal defect.  ? ?Coronary CTA 08/09/19 ? ?1. Coronary calcium score of 190. This was 90 percentile for age and ?sex matched control. ?  ?2. Normal coronary origin with right dominance. ?  ?3. CAD-RADS 2. Mild non-obstructive CAD (25-49%) in the proximal ?portion of LAD, RCA and OM1. Consider non-atherosclerotic causes of ?chest pain. Consider preventive therapy and risk factor ?modification. ? ?Recent Labs: ?12/12/2021: ALT 16; BUN 14; Creatinine, Ser 0.87; Hemoglobin 12.6; Platelets 185.0; Potassium 3.6; Sodium 139  ?Recent Lipid Panel ?   ?Component Value Date/Time  ? CHOL 141 11/23/2019 0735  ? TRIG 115 11/23/2019 0735  ? HDL 68 11/23/2019 0735  ? CHOLHDL 2.1 11/23/2019 0735  ? [place] 53 11/23/2019 0735  ? ? ? ?Risk Assessment/Calculations:   ?  ? ? ?Physical Exam:   ? ?VS:  BP 136/82    Pulse 75   Ht 5' 5"  (1.651 m)   Wt 173 lb (78.5 kg)   SpO2 99%   BMI 28.79 kg/m?    ? ?Wt Readings from Last 3 Encounters:  ?01/12/22 173 lb (78.5 kg)  ?12/31/21 172 lb 6.4 oz (78.2 kg)  ?12/12/21 172 lb (78

## 2022-01-12 ENCOUNTER — Encounter: Payer: Self-pay | Admitting: Nurse Practitioner

## 2022-01-12 ENCOUNTER — Encounter: Payer: Self-pay | Admitting: Gastroenterology

## 2022-01-12 ENCOUNTER — Ambulatory Visit (INDEPENDENT_AMBULATORY_CARE_PROVIDER_SITE_OTHER): Payer: Medicare Other | Admitting: Nurse Practitioner

## 2022-01-12 VITALS — BP 136/82 | HR 75 | Ht 65.0 in | Wt 173.0 lb

## 2022-01-12 DIAGNOSIS — K5120 Ulcerative (chronic) proctitis without complications: Secondary | ICD-10-CM | POA: Diagnosis not present

## 2022-01-12 DIAGNOSIS — I25119 Atherosclerotic heart disease of native coronary artery with unspecified angina pectoris: Secondary | ICD-10-CM | POA: Diagnosis not present

## 2022-01-12 DIAGNOSIS — E785 Hyperlipidemia, unspecified: Secondary | ICD-10-CM | POA: Diagnosis not present

## 2022-01-12 DIAGNOSIS — I10 Essential (primary) hypertension: Secondary | ICD-10-CM

## 2022-01-12 MED ORDER — NITROGLYCERIN 0.4 MG SL SUBL: 0.4000 mg | SUBLINGUAL_TABLET | SUBLINGUAL | 2 refills | Status: DC | PRN

## 2022-01-12 MED ORDER — ASPIRIN EC 81 MG PO TBEC: 81.0000 mg | DELAYED_RELEASE_TABLET | Freq: Every day | ORAL | 3 refills | Status: DC

## 2022-01-12 NOTE — Patient Instructions (Signed)
Medication Instructions:  ? ?START Aspirin one ( 1 ) tablet by mouth ( 81 mg ) daily.  ? ?START Nitroglycerin (0.4 mg) sublingual (under your tongue.    If a single episode of chest pain is not relieved by one tablet, the patient will try another within 5 minutes; and if this doesn't relieve the pain, the patient will try another within 5 minutes and if this doesn't relieve the pain the patient is instructed to call 911 for transportation to an emergency department. ?  ? ?*If you need a refill on your cardiac medications before your next appointment, please call your pharmacy* ? ? ?Lab Work: ? ?Your physician recommends that you return for a FASTING lipid profile/bmet/cbc Thursday, April 27. You can come in on the day of your appointment anytime between 7:30-4:30 fasting from midnight the night before.  ? ? ?If you have labs (blood work) drawn today and your tests are completely normal, you will receive your results only by: ?MyChart Message (if you have MyChart) OR ?A paper copy in the mail ?If you have any lab test that is abnormal or we need to change your treatment, we will call you to review the results. ? ? ?Testing/Procedures: ? ? ?[facility] ?[place] OFFICE ?[place], [address] ?[place] [address] ?Dept: [phone] ?Loc: [phone] ? ?Andrea [name]  01/12/2022 ? ?You are scheduled for a Cardiac Catheterization on Wednesday, May 3 with Dr. Lauree Chandler. ? ?1. Please arrive at the Main Entrance A at [facility]: [place], [place] [address] at 6:30 AM (This time is two hours before your procedure to ensure your preparation). Free valet parking service is available.  ? ?Special note: Every effort is made to have your procedure done on time. Please understand that emergencies sometimes delay scheduled procedures. ? ?2. Diet: Do not eat solid foods after midnight.  You may have clear liquids until 5 AM  upon the day of the procedure. ? ?3. Labs: You will need to have blood drawn on , April 27 at [facility] at [facility]. [address], [place]  ?Open: 7:30am - 5pm    Phone: [phone]. You do need to be fasting. ? ?4. Medication instructions in preparation for your procedure: ? ? Contrast Allergy: No ? ?Stop taking your Maxide the am of Cath.  ? ?On the morning of your procedure, take Aspirin and any morning medicines NOT listed above.  You may use sips of water. ? ?5. Plan to go home the same day, you will only stay overnight if medically necessary. ?6. You MUST have a responsible adult to drive you home. ?7. An adult MUST be with you the first 24 hours after you arrive home. ?8. Bring a current list of your medications, and the last time and date medication taken. ?9. Bring ID and current insurance cards. ?10.Please wear clothes that are easy to get on and off and wear slip-on shoes. ? ?Thank you for allowing [place] to care for you! ?  -- [facility]  ?[place] [facility] ?[place] OFFICE ?[place] [place], [address] ?[place] [address] ?Dept: [phone] ?Loc: [phone] ? ?Andrea [name]  01/12/2022 ? ? ?Follow-Up: ?At [facility], you and your health needs are our priority.  As part of our continuing mission to provide you with exceptional heart care, we have created  designated Provider Care Teams.  These Care Teams include your primary Cardiologist (physician) and Advanced Practice Providers (APPs -  Physician Assistants and Nurse Practitioners) who all work together to provide you with the care you need, when you need it. ? ?We recommend signing up for the patient portal called "MyChart".  Sign up information is provided on this After Visit Summary.  MyChart is used to connect with patients for Virtual Visits (Telemedicine).  Patients are able to view lab/test results, encounter notes,  upcoming appointments, etc.  Non-urgent messages can be sent to your provider as well.   ?To learn more about what you can do with MyChart, go to [email or web address].   ? ?Your next appointment:   ?3 week(s) ? ?The format for your next appointment:   ?In Person ? ?Provider:   ?Christen Bame, NP       ? ?Important Information About Sugar ? ? ? ? ?  ?

## 2022-01-13 ENCOUNTER — Other Ambulatory Visit: Payer: Self-pay | Admitting: Cardiology

## 2022-01-15 ENCOUNTER — Other Ambulatory Visit: Payer: Self-pay

## 2022-01-15 ENCOUNTER — Other Ambulatory Visit: Payer: Medicare Other

## 2022-01-15 DIAGNOSIS — I25119 Atherosclerotic heart disease of native coronary artery with unspecified angina pectoris: Secondary | ICD-10-CM

## 2022-01-15 DIAGNOSIS — E785 Hyperlipidemia, unspecified: Secondary | ICD-10-CM

## 2022-01-15 LAB — BASIC METABOLIC PANEL
BUN/Creatinine Ratio: 18 (ref 12–28)
BUN: 15 mg/dL (ref 8–27)
CO2: 25 mmol/L (ref 20–29)
Calcium: 9.9 mg/dL (ref 8.7–10.3)
Chloride: 104 mmol/L (ref 96–106)
Creatinine, Ser: 0.85 mg/dL (ref 0.57–1.00)
Glucose: 101 mg/dL — ABNORMAL HIGH (ref 70–99)
Potassium: 4.6 mmol/L (ref 3.5–5.2)
Sodium: 140 mmol/L (ref 134–144)
eGFR: 75 mL/min/{1.73_m2} (ref >59)

## 2022-01-15 LAB — CBC
Hematocrit: 37.1 % (ref 34.0–46.6)
Hemoglobin: 12.5 g/dL (ref 11.1–15.9)
MCH: 29.6 pg (ref 26.6–33.0)
MCHC: 33.7 g/dL (ref 31.5–35.7)
MCV: 88 fL (ref 79–97)
Platelets: 256 10*3/uL (ref 150–450)
RBC: 4.22 x10E6/uL (ref 3.77–5.28)
RDW: 13.6 % (ref 11.7–15.4)
WBC: 7.7 10*3/uL (ref 3.4–10.8)

## 2022-01-15 LAB — LIPID PANEL
Chol/HDL Ratio: 2 ratio (ref 0.0–4.4)
Cholesterol, Total: 131 mg/dL (ref 100–199)
HDL: 65 mg/dL (ref >39)
LDL Chol Calc (NIH): 52 mg/dL (ref 0–99)
Triglycerides: 66 mg/dL (ref 0–149)
VLDL Cholesterol Cal: 14 mg/dL (ref 5–40)

## 2022-01-15 MED ORDER — ATORVASTATIN CALCIUM 80 MG PO TABS: 80.0000 mg | ORAL_TABLET | Freq: Every day | ORAL | 3 refills | Status: DC

## 2022-01-16 ENCOUNTER — Other Ambulatory Visit: Payer: Medicare Other

## 2022-01-16 NOTE — Progress Notes (Signed)
Pt has been made aware of normal result and verbalized understanding.  jw

## 2022-01-19 ENCOUNTER — Telehealth: Payer: Self-pay | Admitting: *Deleted

## 2022-01-19 NOTE — Telephone Encounter (Signed)
Cardiac Catheterization scheduled at [facility] for: Wednesday Jan 21, 2022 8:30 AM ?Arrival time and place: [place] Entrance A at: 6:30 AM ? ? ?No solid food after midnight prior to cath, clear liquids until 5 AM day of procedure. ? ?Medication instructions: ?-Usual morning medications can be taken with sips of water including aspirin 81 mg. ? ?Confirmed patient has responsible adult to drive home post procedure and be with patient first 24 hours after arriving home. ? ?Patient reports no new symptoms concerning for COVID-19/no exposure to COVID-19 in the past 10 days. ? ?Reviewed procedure instructions with patient.  ?

## 2022-01-20 ENCOUNTER — Telehealth: Payer: Self-pay | Admitting: Cardiovascular Disease

## 2022-01-20 NOTE — Telephone Encounter (Signed)
Spoke with patient regarding questions about cath procedure tomorrow. ? ?Patient asked when she could expect to go home after the procedure if everything went well, if family can be in the room with her after the procedure and if that cath only takes 30 minutes to complete. ? ?Explained that there are many variables to consider when having a cath procedure performed and it is difficult to estimate the time it would take to complete and discharge patient. Advised patient to plan on being at the hospital for the day. ? ?Patient verbalized understanding and thanked me for calling. ? ?

## 2022-01-20 NOTE — Telephone Encounter (Signed)
New Message: ? ? ?Please call, patient is having a Cath tomorrow. Patient says she have some questions. ?

## 2022-01-21 ENCOUNTER — Other Ambulatory Visit: Payer: Self-pay

## 2022-01-21 ENCOUNTER — Encounter (HOSPITAL_COMMUNITY): Payer: Self-pay | Admitting: Cardiovascular Disease

## 2022-01-21 ENCOUNTER — Encounter (HOSPITAL_COMMUNITY)
Admission: RE | Disposition: A | Discharge status: Home / Self Care | Payer: Self-pay | Source: Home / Self Care | Attending: Cardiovascular Disease

## 2022-01-21 ENCOUNTER — Ambulatory Visit (HOSPITAL_COMMUNITY)
Admission: RE | Admit: 2022-01-21 | Discharge: 2022-01-21 | Disposition: A | Discharge status: Home / Self Care | Payer: Medicare Other | Attending: Cardiovascular Disease | Admitting: Cardiovascular Disease

## 2022-01-21 DIAGNOSIS — K5120 Ulcerative (chronic) proctitis without complications: Secondary | ICD-10-CM | POA: Diagnosis not present

## 2022-01-21 DIAGNOSIS — E785 Hyperlipidemia, unspecified: Secondary | ICD-10-CM | POA: Diagnosis not present

## 2022-01-21 DIAGNOSIS — R079 Chest pain, unspecified: Secondary | ICD-10-CM | POA: Diagnosis present

## 2022-01-21 DIAGNOSIS — I2510 Atherosclerotic heart disease of native coronary artery without angina pectoris: Secondary | ICD-10-CM | POA: Insufficient documentation

## 2022-01-21 DIAGNOSIS — I10 Essential (primary) hypertension: Secondary | ICD-10-CM | POA: Diagnosis not present

## 2022-01-21 DIAGNOSIS — I25119 Atherosclerotic heart disease of native coronary artery with unspecified angina pectoris: Secondary | ICD-10-CM

## 2022-01-21 HISTORY — PX: LEFT HEART CATH AND CORONARY ANGIOGRAPHY: CATH118249

## 2022-01-21 SURGERY — LEFT HEART CATH AND CORONARY ANGIOGRAPHY
Anesthesia: LOCAL

## 2022-01-21 MED ORDER — SODIUM CHLORIDE 0.9% FLUSH: 3.0000 mL | INTRAVENOUS | Status: DC | PRN

## 2022-01-21 MED ORDER — HEPARIN SODIUM (PORCINE) 1000 UNIT/ML IJ SOLN
INTRAMUSCULAR | Status: AC
  Filled 2022-01-21: qty 10

## 2022-01-21 MED ORDER — HEPARIN (PORCINE) IN NACL 1000-0.9 UT/500ML-% IV SOLN
INTRAVENOUS | Status: AC
  Filled 2022-01-21: qty 1000

## 2022-01-21 MED ORDER — HEPARIN (PORCINE) IN NACL 1000-0.9 UT/500ML-% IV SOLN
INTRAVENOUS | Status: DC | PRN
Start: 2022-01-21 — End: 2022-01-21
  Administered 2022-01-21 (×2): 500 mL

## 2022-01-21 MED ORDER — IOHEXOL 350 MG/ML SOLN
INTRAVENOUS | Status: DC | PRN
  Administered 2022-01-21: 70 mL

## 2022-01-21 MED ORDER — SODIUM CHLORIDE 0.9% FLUSH: 3.0000 mL | Freq: Two times a day (BID) | INTRAVENOUS | Status: DC

## 2022-01-21 MED ORDER — MIDAZOLAM HCL 2 MG/2ML IJ SOLN
INTRAMUSCULAR | Status: AC
  Filled 2022-01-21: qty 2

## 2022-01-21 MED ORDER — SODIUM CHLORIDE 0.9 % IV SOLN: 250.0000 mL | INTRAVENOUS | Status: DC | PRN

## 2022-01-21 MED ORDER — SODIUM CHLORIDE 0.9 % IV SOLN: INTRAVENOUS | Status: AC

## 2022-01-21 MED ORDER — ONDANSETRON HCL 4 MG/2ML IJ SOLN: 4.0000 mg | Freq: Four times a day (QID) | INTRAMUSCULAR | Status: DC | PRN

## 2022-01-21 MED ORDER — LIDOCAINE HCL (PF) 1 % IJ SOLN
INTRAMUSCULAR | Status: AC
  Filled 2022-01-21: qty 30

## 2022-01-21 MED ORDER — FENTANYL CITRATE (PF) 100 MCG/2ML IJ SOLN
INTRAMUSCULAR | Status: DC | PRN
  Administered 2022-01-21: 50 ug via INTRAVENOUS

## 2022-01-21 MED ORDER — VERAPAMIL HCL 2.5 MG/ML IV SOLN
INTRAVENOUS | Status: AC
  Filled 2022-01-21: qty 2

## 2022-01-21 MED ORDER — ACETAMINOPHEN 325 MG PO TABS: 650.0000 mg | ORAL_TABLET | ORAL | Status: DC | PRN

## 2022-01-21 MED ORDER — SODIUM CHLORIDE 0.9 % WEIGHT BASED INFUSION
3.0000 mL/kg/h | INTRAVENOUS | Status: AC
  Administered 2022-01-21: 3 mL/kg/h via INTRAVENOUS

## 2022-01-21 MED ORDER — HEPARIN SODIUM (PORCINE) 1000 UNIT/ML IJ SOLN
INTRAMUSCULAR | Status: DC | PRN
  Administered 2022-01-21: 4000 [IU] via INTRAVENOUS

## 2022-01-21 MED ORDER — HYDRALAZINE HCL 20 MG/ML IJ SOLN: 10.0000 mg | INTRAMUSCULAR | Status: DC | PRN

## 2022-01-21 MED ORDER — LIDOCAINE HCL (PF) 1 % IJ SOLN
INTRAMUSCULAR | Status: DC | PRN
  Administered 2022-01-21: 2 mL

## 2022-01-21 MED ORDER — VERAPAMIL HCL 2.5 MG/ML IV SOLN
INTRAVENOUS | Status: DC | PRN
  Administered 2022-01-21: 10 mL via INTRA_ARTERIAL

## 2022-01-21 MED ORDER — FENTANYL CITRATE (PF) 100 MCG/2ML IJ SOLN
INTRAMUSCULAR | Status: AC
Start: 2022-01-21 — End: ?
  Filled 2022-01-21: qty 2

## 2022-01-21 MED ORDER — LABETALOL HCL 5 MG/ML IV SOLN: 10.0000 mg | INTRAVENOUS | Status: DC | PRN

## 2022-01-21 MED ORDER — ASPIRIN 81 MG PO CHEW: 81.0000 mg | CHEWABLE_TABLET | ORAL | Status: AC

## 2022-01-21 MED ORDER — SODIUM CHLORIDE 0.9 % WEIGHT BASED INFUSION
1.0000 mL/kg/h | INTRAVENOUS | Status: DC
  Administered 2022-01-21: 1 mL/kg/h via INTRAVENOUS

## 2022-01-21 MED ORDER — MIDAZOLAM HCL 2 MG/2ML IJ SOLN
INTRAMUSCULAR | Status: DC | PRN
  Administered 2022-01-21: 2 mg via INTRAVENOUS

## 2022-01-21 SURGICAL SUPPLY — 10 items
CATH 5FR JL3.5 JR4 ANG PIG MP (CATHETERS) ×1 IMPLANT
DEVICE RAD COMP TR BAND LRG (VASCULAR PRODUCTS) ×1 IMPLANT
GLIDESHEATH SLEND SS 6F .021 (SHEATH) ×1 IMPLANT
GUIDEWIRE INQWIRE 1.5J.035X260 (WIRE) IMPLANT
INQWIRE 1.5J .035X260CM (WIRE) ×2
KIT HEART LEFT (KITS) ×2 IMPLANT
PACK CARDIAC CATHETERIZATION (CUSTOM PROCEDURE TRAY) ×2 IMPLANT
SYR MEDRAD MARK 7 150ML (SYRINGE) ×2 IMPLANT
TRANSDUCER W/STOPCOCK (MISCELLANEOUS) ×2 IMPLANT
TUBING CIL FLEX 10 FLL-RA (TUBING) ×2 IMPLANT

## 2022-01-21 NOTE — Progress Notes (Signed)
Pt called for RN to bedside, PIV to lfa hurting and swollen, IV dc'd, small amount of SG drainage expressed from arm, warm compress given, lue elevated on pillow, drsg placed, safety maintained ?

## 2022-01-21 NOTE — Discharge Instructions (Signed)
Radial Site Care  This sheet gives you information about how to care for yourself after your procedure. Your health care provider may also give you more specific instructions. If you have problems or questions, contact your health care provider. What can I expect after the procedure? After the procedure, it is common to have: Bruising and tenderness at the catheter insertion area. Follow these instructions at home: Medicines Take over-the-counter and prescription medicines only as told by your health care provider. Insertion site care Follow instructions from your health care provider about how to take care of your insertion site. Make sure you: Wash your hands with soap and water before you remove your bandage (dressing). If soap and water are not available, use hand sanitizer. May remove dressing in 24 hours. Check your insertion site every day for signs of infection. Check for: Redness, swelling, or pain. Fluid or blood. Pus or a bad smell. Warmth. Do no take baths, swim, or use a hot tub for 5 days. You may shower 24-48 hours after the procedure. Remove the dressing and gently wash the site with plain soap and water. Pat the area dry with a clean towel. Do not rub the site. That could cause bleeding. Do not apply powder or lotion to the site. Activity  For 24 hours after the procedure, or as directed by your health care provider: Do not flex or bend the affected arm. Do not push or pull heavy objects with the affected arm. Do not drive yourself home from the hospital or clinic. You may drive 24 hours after the procedure. Do not operate machinery or power tools. KEEP ARM ELEVATED THE REMAINDER OF THE DAY. Do not push, pull or lift anything that is heavier than 10 lb for 5 days. Ask your health care provider when it is okay to: Return to work or school. Resume usual physical activities or sports. Resume sexual activity. General instructions If the catheter site starts to  bleed, raise your arm and put firm pressure on the site. If the bleeding does not stop, get help right away. This is a medical emergency. DRINK PLENTY OF FLUIDS FOR THE NEXT 2-3 DAYS. No alcohol consumption for 24 hours after receiving sedation. If you went home on the same day as your procedure, a responsible adult should be with you for the first 24 hours after you arrive home. Keep all follow-up visits as told by your health care provider. This is important. Contact a health care provider if: You have a fever. You have redness, swelling, or yellow drainage around your insertion site. Get help right away if: You have unusual pain at the radial site. The catheter insertion area swells very fast. The insertion area is bleeding, and the bleeding does not stop when you hold steady pressure on the area. Your arm or hand becomes pale, cool, tingly, or numb. These symptoms may represent a serious problem that is an emergency. Do not wait to see if the symptoms will go away. Get medical help right away. Call your local emergency services (911 in the [place] Do not drive yourself to the hospital. Summary After the procedure, it is common to have bruising and tenderness at the site. Follow instructions from your health care provider about how to take care of your radial site wound. Check the wound every day for signs of infection.  This information is not intended to replace advice given to you by your health care provider. Make sure you discuss any questions you have with   your health care provider. Document Revised: 10/13/2017 Document Reviewed: 10/13/2017 Elsevier Patient Education  2020 [employer]  

## 2022-01-21 NOTE — Interval H&P Note (Signed)
History and Physical Interval Note: ? ?01/21/2022 ?7:35 AM ? ?ELFA WOOTON  has presented today for surgery, with the diagnosis of chest pain.  The various methods of treatment have been discussed with the patient and family. After consideration of risks, benefits and other options for treatment, the patient has consented to  Procedure(s): ?LEFT HEART CATH AND CORONARY ANGIOGRAPHY (N/A) as a surgical intervention.  The patient's history has been reviewed, patient examined, no change in status, stable for surgery.  I have reviewed the patient's chart and labs.  Questions were answered to the patient's satisfaction.   ? ?Cath Lab Visit (complete for each Cath Lab visit) ? ?Clinical Evaluation Leading to the Procedure:  ? ?ACS: No. ? ?Non-ACS:   ? ?Anginal Classification: CCS III ? ?Anti-ischemic medical therapy: No Therapy ? ?Non-Invasive Test Results: No non-invasive testing performed ? ?Prior CABG: No previous CABG ? ? ? ? ? ? ? ?Lauree Chandler ? ? ?

## 2022-02-11 NOTE — Progress Notes (Signed)
Cardiology Office Note:    Date:  02/13/2022   ID:  Andrea [name], DOB November 15, 1955, MRN [id number]  PCP:  Dineen Kid, MD   [facility] Providers Cardiologist:  Fransico Him, MD     Referring MD: Dineen Kid, MD   Chief Complaint: follow-up s/p cardiac catheterization  History of Present Illness:    Andrea [name] is a very pleasant 66 y.o. female with a hx of chest pain, hypertension, hyperlipidemia, ulcerative colitis, and PUD.   She was referred to cardiology by PCP for evaluation of chest pain and seen on 07/06/2019 by Dr. Radford Pax.  She reported pressure and burning sensation in her chest when she would go outside on a cold day with any activity.  She thought it was just cold air and ignored it.  Then in warmer months she had burning sensation in chest when walking up a hill accompanied by shortness of breath. Coronary CTA revealed calcium score of 190, 89th percentile for age and sex matched control, mild nonobstructive CAD in the proximal portion of LAD, RCA, and OM1.  Lexiscan Myoview was ordered to rule out ischemia and it revealed no evidence of prior infarct or ischemia.  Echo 07/18/2019 revealed LVEF 60 to 65%, no LVH, normal RV, normal valve function.  Advised to follow-up as needed.  At her last office visit on 01/12/22, she reported increasing episodes of midsternal chest tightness that occur when she is walking accompanied by shortness of breath.  She denied N/V, diaphoresis.  No pain at rest. Feels that symptoms have intensified since last office visit. Mild leg edema at the end of most days that resolves over night.  Was less active during pandemic and has gradually been increasing physical activity.  Injured herself attempting to play pickle ball recently. She was scheduled for left heart cath on 5/3 which revealed 20% stenosis proximal RCA and proximal LAD vessels, normal LVEDP, LVEF 55-65%.   Today, she is here alone for follow-up.  She reports she is overall feeling well.   Continues to have some midsternal chest tightness she feels may be 2/2 recent rib fracture following a fall that occurred while playing pickle ball.  She has returned to the [facility] to resume regular exercise.  Is also active with care of her young grandson on a regular basis . She denies lower extremity edema, fatigue, palpitations, melena, hematuria, hemoptysis, diaphoresis, weakness, presyncope, syncope, orthopnea, and PND.  Past Medical History:  Diagnosis Date   Arthritis    Chest discomfort    Disorder of immune system ([place])    Dyspnea on exertion    Hypercholesterolemia    Hypertension    Migraine    Non-ulcer dyspepsia    Osteopenia of spine    Peptic ulcer disease    Pneumonia    Ulcerative colitis ([place])     Past Surgical History:  Procedure Laterality Date   COLONOSCOPY     ESOPHAGOGASTRODUODENOSCOPY     hip replacement Bilateral    LEFT HEART CATH AND CORONARY ANGIOGRAPHY N/A 01/21/2022   Procedure: LEFT HEART CATH AND CORONARY ANGIOGRAPHY;  Surgeon: Burnell Blanks, MD;  Location: [place] [facility];  Service: Cardiovascular;  Laterality: N/A;    Current Medications: Current Meds  Medication Sig   acetaminophen (TYLENOL) 500 MG tablet Take 1,000 mg by mouth every 6 (six) hours as needed for moderate pain.   azaTHIOprine (IMURAN) 50 MG tablet Take 100 mg by mouth daily.   ezetimibe (ZETIA) 10 MG tablet Take 1  tablet (10 mg total) by mouth daily. Please keep upcoming appt in April 2023 with Cardiologist before anymore refills. Thank you Final Attempt   fluticasone (FLONASE) 50 MCG/ACT nasal spray Place 2 sprays into both nostrils daily.   Glucosamine HCl (GLUCOSAMINE PO) Take 1 tablet by mouth daily.   inFLIXimab (REMICADE) 100 MG injection Inject into the vein. Every 8 weeks   Multiple Vitamins-Minerals (IMMUNE SUPPORT) CHEW Chew 2 capsules by mouth daily.   nitroGLYCERIN (NITROSTAT) 0.4 MG SL tablet Place 1 tablet (0.4 mg total) under the tongue every 5 (five)  minutes as needed for chest pain.   PARoxetine (PAXIL) 20 MG tablet Take 10 mg by mouth daily.   triamterene-hydrochlorothiazide (MAXZIDE-25) 37.5-25 MG tablet Take 1 tablet by mouth daily.   zinc gluconate 50 MG tablet Take 50 mg by mouth daily.   [DISCONTINUED] atorvastatin (LIPITOR) 80 MG tablet Take 1 tablet (80 mg total) by mouth daily.     Allergies:   Codeine   Social History   Socioeconomic History   Marital status: Divorced    Spouse name: Not on file   Number of children: 3   Years of education: Not on file   Highest education level: Not on file  Occupational History   Occupation: retired  Tobacco Use   Smoking status: Never   Smokeless tobacco: Never  Vaping Use   Vaping Use: Never used  Substance and Sexual Activity   Alcohol use: No   Drug use: No   Sexual activity: Yes  Other Topics Concern   Not on file  Social History Narrative   Not on file   Social Determinants of Health   Financial Resource Strain: Not on file  Food Insecurity: Not on file  Transportation Needs: Not on file  Physical Activity: Not on file  Stress: Not on file  Social Connections: Not on file     Family History: The patient's family history includes CAD (age of onset: 66) in her brother; CAD (age of onset: 22) in her mother; Heart attack in her mother; Heart attack (age of onset: 2) in her father; Other in her son; Skin cancer in her brother. There is no history of Colon cancer.  ROS:   Please see the history of present illness.  All other systems reviewed and are negative.  Labs/Other Studies Reviewed:    The following studies were reviewed today:  LHC 01/21/22    Prox RCA lesion is 20% stenosed.   Prox LAD lesion is 20% stenosed.   The left ventricular systolic function is normal.   LV end diastolic pressure is normal.   The left ventricular ejection fraction is 55-65% by visual estimate.   Mild non-obstructive CAD Normal LV systolic function    Recommendations:  Medical management of mild CAD. Explore other causes of chest pain.     Lexiscan myoview 08/28/19  Nuclear stress EF: 64%. There was no ST segment deviation noted during stress. The study is normal. This is a low risk study. The left ventricular ejection fraction is normal (55-65%).   Normal pharmacologic nuclear stress test with no evidence for prior infarct or ischemia.  LVEF 64%.  Echo 07/18/19  Left Ventricle: Left ventricular ejection fraction, by visual estimation,  is 60 to 65%. The left ventricle has normal function. No evidence of left  ventricular regional wall motion abnormalities. There is no left  ventricular hypertrophy. Normal left  ventricular size. Spectral Doppler shows Left ventricular diastolic  parameters were normal pattern of LV  diastolic filling.  Right Ventricle: The right ventricular size is normal. No increase in  right ventricular wall thickness. Global RV systolic function is has  normal systolic function. The tricuspid regurgitant velocity is 2.40 m/s,  and with an assumed right atrial pressure   of 3 mmHg, the estimated right ventricular systolic pressure is normal at  25.9 mmHg.  Left Atrium: Left atrial size was normal in size.  Right Atrium: Right atrial size was mildly dilated  Pericardium: Trivial pericardial effusion is present.  Mitral Valve: The mitral valve is normal in structure. No evidence of  mitral valve stenosis by observation. No evidence of mitral valve  regurgitation.  Tricuspid Valve: The tricuspid valve is normal in structure. Tricuspid  valve regurgitation is trivial by color flow Doppler.  Aortic Valve: The aortic valve has an indeterminant number of cusps.  Aortic valve regurgitation was not visualized by color flow Doppler. The  aortic valve is structurally normal, with no evidence of sclerosis or  stenosis.  Pulmonic Valve: The pulmonic valve was not well visualized. Pulmonic valve  regurgitation is not visualized by color  flow Doppler.  Aorta: The aortic root, ascending aorta and aortic arch are all  structurally normal, with no evidence of dilitation or obstruction.  Pulmonary Artery: The pulmonary artery is not well seen.  Venous: The inferior vena cava is normal in size with greater than 50%  respiratory variability, suggesting right atrial pressure of 3 mmHg.  IAS/Shunts: No atrial level shunt detected by color flow Doppler. No  ventricular septal defect is seen or detected. There is no evidence of an  atrial septal defect.   Coronary CTA 08/09/19  1. Coronary calcium score of 190. This was 75 percentile for age and sex matched control.   2. Normal coronary origin with right dominance.   3. CAD-RADS 2. Mild non-obstructive CAD (25-49%) in the proximal portion of LAD, RCA and OM1. Consider non-atherosclerotic causes of chest pain. Consider preventive therapy and risk factor modification.  Recent Labs: 12/12/2021: ALT 16 01/15/2022: BUN 15; Creatinine, Ser 0.85; Hemoglobin 12.5; Platelets 256; Potassium 4.6; Sodium 140  Recent Lipid Panel    Component Value Date/Time   CHOL 131 01/15/2022 0907   TRIG 66 01/15/2022 0907   HDL 65 01/15/2022 0907   CHOLHDL 2.0 01/15/2022 0907   LDLCALC 52 01/15/2022 0907     Risk Assessment/Calculations:       Physical Exam:    VS:  BP 130/70 (BP Location: Left Arm, Patient Position: Sitting, Cuff Size: Normal)   Pulse 78   Ht 5' 5"  (1.651 m)   Wt 173 lb (78.5 kg)   SpO2 96%   BMI 28.79 kg/m     Wt Readings from Last 3 Encounters:  02/13/22 173 lb (78.5 kg)  01/21/22 170 lb (77.1 kg)  01/12/22 173 lb (78.5 kg)     GEN:  Well nourished, well developed in no acute distress HEENT: Normal NECK: No JVD; No carotid bruits CARDIAC: RRR, no murmurs, rubs, gallops RESPIRATORY:  Clear to auscultation without rales, wheezing or rhonchi  ABDOMEN: Soft, non-tender, non-distended MUSCULOSKELETAL:  No edema; No deformity. 2+ pedal pulses,  equalbilaterally SKIN: Warm and dry NEUROLOGIC:  Alert and oriented x 3 PSYCHIATRIC:  Normal affect   EKG:  EKG is  ordered today.  The ekg ordered today demonstrates NSR at 75 bpm, no ST/T wave abnormality  Diagnoses:    1. Hyperlipidemia LDL goal <70   2. Essential hypertension   3. Coronary artery disease  involving native coronary artery of native heart without angina pectoris   4. Ulcerative proctitis without complication ([place])     Assessment and Plan:     CAD without angina:  Mild non-obstructive disease by cardiac cath 01/21/22. She continues to have some mid-sternal chest pain that may be 2/2 recent rib fracture. No indication for further testing at this time. Continue statin, ezetimibe.   Hyperlipidemia LDL goal < 70: LDL 52 on 01/15/22. Would like to reduce atorvastatin due to aching in joints. Continue Zetia. Encouraged increased regular physical activity and heart healthy, mostly plant-based diet.   Hypertension: BP stable today and at home. Continue Maxzide.   Ulcerative colitis: She stopped her aspirin following cardiac cath. History of ulcerative colitis in remission on Remicade and Imuran. Agree with decision to stop aspirin.   Disposition: 6 months with Dr. Radford Pax    Medication Adjustments/Labs and Tests Ordered: Current medicines are reviewed at length with the patient today.  Concerns regarding medicines are outlined above.  No orders of the defined types were placed in this encounter.  Meds ordered this encounter  Medications   atorvastatin (LIPITOR) 40 MG tablet    Sig: Take 1 tablet (40 mg total) by mouth daily.    Dispense:  90 tablet    Refill:  3    Patient Instructions  Medication Instructions:   DECREASE Atorvastatin one (1) tablet by mouth ( 40 mg) daily.   *If you need a refill on your cardiac medications before your next appointment, please call your pharmacy*   Lab Work:  None ordered.   If you have labs (blood work) drawn today and your  tests are completely normal, you will receive your results only by: [place] (if you have MyChart) OR A paper copy in the mail If you have any lab test that is abnormal or we need to change your treatment, we will call you to review the results.   Testing/Procedures:  None ordered.   Follow-Up: At [facility], you and your health needs are our priority.  As part of our continuing mission to provide you with exceptional heart care, we have created designated Provider Care Teams.  These Care Teams include your primary Cardiologist (physician) and Advanced Practice Providers (APPs -  Physician Assistants and Nurse Practitioners) who all work together to provide you with the care you need, when you need it.  We recommend signing up for the patient portal called "MyChart".  Sign up information is provided on this After Visit Summary.  MyChart is used to connect with patients for Virtual Visits (Telemedicine).  Patients are able to view lab/test results, encounter notes, upcoming appointments, etc.  Non-urgent messages can be sent to your provider as well.   To learn more about what you can do with MyChart, go to [email or web address].    Your next appointment:   6 month(s)  The format for your next appointment:   In Person  Provider:   Fransico Him, MD    Important Information About Sugar         Signed, Emmaline Life, NP  02/13/2022 1:30 PM    [place]

## 2022-02-13 ENCOUNTER — Other Ambulatory Visit: Payer: Self-pay | Admitting: Pharmacy Technician

## 2022-02-13 ENCOUNTER — Encounter: Payer: Self-pay | Admitting: Nurse Practitioner

## 2022-02-13 ENCOUNTER — Ambulatory Visit (INDEPENDENT_AMBULATORY_CARE_PROVIDER_SITE_OTHER): Payer: Medicare Other | Admitting: Nurse Practitioner

## 2022-02-13 VITALS — BP 130/70 | HR 78 | Ht 65.0 in | Wt 173.0 lb

## 2022-02-13 DIAGNOSIS — E785 Hyperlipidemia, unspecified: Secondary | ICD-10-CM | POA: Diagnosis not present

## 2022-02-13 DIAGNOSIS — I2510 Atherosclerotic heart disease of native coronary artery without angina pectoris: Secondary | ICD-10-CM

## 2022-02-13 DIAGNOSIS — I10 Essential (primary) hypertension: Secondary | ICD-10-CM | POA: Diagnosis not present

## 2022-02-13 DIAGNOSIS — K5120 Ulcerative (chronic) proctitis without complications: Secondary | ICD-10-CM | POA: Diagnosis not present

## 2022-02-13 MED ORDER — ATORVASTATIN CALCIUM 40 MG PO TABS
40.0000 mg | ORAL_TABLET | Freq: Every day | ORAL | 3 refills | Status: DC
Start: 2022-02-13 — End: 2023-01-21

## 2022-02-13 NOTE — Patient Instructions (Signed)
Medication Instructions:   DECREASE Atorvastatin one (1) tablet by mouth ( 40 mg) daily.   *If you need a refill on your cardiac medications before your next appointment, please call your pharmacy*   Lab Work:  None ordered.   If you have labs (blood work) drawn today and your tests are completely normal, you will receive your results only by: [place] (if you have MyChart) OR A paper copy in the mail If you have any lab test that is abnormal or we need to change your treatment, we will call you to review the results.   Testing/Procedures:  None ordered.   Follow-Up: At [facility], you and your health needs are our priority.  As part of our continuing mission to provide you with exceptional heart care, we have created designated Provider Care Teams.  These Care Teams include your primary Cardiologist (physician) and Advanced Practice Providers (APPs -  Physician Assistants and Nurse Practitioners) who all work together to provide you with the care you need, when you need it.  We recommend signing up for the patient portal called "MyChart".  Sign up information is provided on this After Visit Summary.  MyChart is used to connect with patients for Virtual Visits (Telemedicine).  Patients are able to view lab/test results, encounter notes, upcoming appointments, etc.  Non-urgent messages can be sent to your provider as well.   To learn more about what you can do with MyChart, go to [email or web address].    Your next appointment:   6 month(s)  The format for your next appointment:   In Person  Provider:   Fransico Him, MD    Important Information About Sugar

## 2022-02-25 ENCOUNTER — Ambulatory Visit: Payer: Medicare Other

## 2022-03-02 ENCOUNTER — Other Ambulatory Visit: Payer: Self-pay | Admitting: Cardiology

## 2022-03-03 ENCOUNTER — Ambulatory Visit (INDEPENDENT_AMBULATORY_CARE_PROVIDER_SITE_OTHER): Payer: Medicare Other

## 2022-03-03 ENCOUNTER — Telehealth: Payer: Self-pay | Admitting: Gastroenterology

## 2022-03-03 VITALS — BP 157/85 | HR 75 | Temp 98.3°F | Resp 16 | Ht 65.0 in | Wt 169.6 lb

## 2022-03-03 DIAGNOSIS — K5130 Ulcerative (chronic) rectosigmoiditis without complications: Secondary | ICD-10-CM

## 2022-03-03 MED ORDER — METHYLPREDNISOLONE SODIUM SUCC 40 MG IJ SOLR
40.0000 mg | Freq: Once | INTRAMUSCULAR | Status: AC
  Administered 2022-03-03: 40 mg via INTRAVENOUS
  Filled 2022-03-03: qty 1

## 2022-03-03 MED ORDER — DIPHENHYDRAMINE HCL 25 MG PO CAPS
25.0000 mg | ORAL_CAPSULE | Freq: Once | ORAL | Status: AC
  Administered 2022-03-03: 25 mg via ORAL
  Filled 2022-03-03: qty 1

## 2022-03-03 MED ORDER — SODIUM CHLORIDE 0.9 % IV SOLN
5.0000 mg/kg | Freq: Once | INTRAVENOUS | Status: AC
  Administered 2022-03-03: 400 mg via INTRAVENOUS
  Filled 2022-03-03: qty 40

## 2022-03-03 MED ORDER — ACETAMINOPHEN 325 MG PO TABS
650.0000 mg | ORAL_TABLET | Freq: Once | ORAL | Status: AC
  Administered 2022-03-03: 650 mg via ORAL
  Filled 2022-03-03: qty 2

## 2022-03-03 NOTE — Telephone Encounter (Signed)
Returned call to patient. She states that she has not heard from [facility] about getting scheduled for her surgery. I told pt that [facility] has their own surgery schedulers and she will need to contact that office. Pt states that they have called her once, she called them back, they called her again and told her that someone would call her back to schedule and she has not heard anything. Pt mentioned wanting to be referred to another surgeon. I told the patient that if we send a new referral somewhere else she would be considered a new patient and that may delay care further. Pt's phone began to break up and I told her several times that I could not hear her. She stated that I couldn't hear her because someone was laughing in the background. She requested to speak to her "nurse". I told her that I am Dr. Loletha Carrow' nurse, her response was "you don't sound like my nurse". I did tell her again that yes, I am his nurse. Pt states that she will figure something out.

## 2022-03-03 NOTE — Progress Notes (Signed)
Diagnosis: Ulcerative Colitis  Provider:  Marshell Garfinkel, MD  Procedure: Infusion  IV Type: Peripheral, IV Location: L Antecubital  Remicade (Infliximab), Dose: 447m  Infusion Start Time: 1211  Infusion Stop Time: 1330  Post Infusion IV Care: Peripheral IV Discontinued  Discharge: Condition: Good, Destination: Home . AVS declined.  Performed by:  CKoren Shiver RN

## 2022-03-03 NOTE — Telephone Encounter (Signed)
Patient called and said she still has not heard from [facility] and it's been almost a couple months now.  She is wondering if she should go somewhere else.  Please call patient and advise.  Thank you,

## 2022-04-22 NOTE — Progress Notes (Signed)
Sent message, via epic in basket, requesting orders in epic from surgeon.  

## 2022-04-23 ENCOUNTER — Ambulatory Visit: Payer: Self-pay | Admitting: Surgery

## 2022-04-27 NOTE — Patient Instructions (Addendum)
DUE TO COVID-19 ONLY TWO VISITORS  (aged 67 and older)  ARE ALLOWED TO COME WITH YOU AND STAY IN THE WAITING ROOM ONLY DURING PRE OP AND PROCEDURE.   **NO VISITORS ARE ALLOWED IN THE SHORT STAY AREA OR RECOVERY ROOM!!**  IF YOU WILL BE ADMITTED INTO THE HOSPITAL YOU ARE ALLOWED ONLY FOUR SUPPORT PEOPLE DURING VISITATION HOURS ONLY (7 AM -8PM)   The support person(s) must pass our screening, gel in and out, and wear a mask at all times, including in the patient's room. Patients must also wear a mask when staff or their support person are in the room. Visitors GUEST BADGE MUST BE WORN VISIBLY  One adult visitor may remain with you overnight and MUST be in the room by 8 P.M.     Your procedure is scheduled on: 05/04/22   Report to [facility] Main Entrance    Report to admitting at : 5:30 AM   Call this number if you have problems the morning of surgery [phone]   Do not eat food :After Midnight.   After Midnight you may have the following liquids until : 4:30 AM DAY OF SURGERY  Water Black Coffee (sugar ok, NO MILK/CREAM OR CREAMERS)  Tea (sugar ok, NO MILK/CREAM OR CREAMERS) regular and decaf                             Plain Jell-O (NO RED)                                           Fruit ices (not with fruit pulp, NO RED)                                     Popsicles (NO RED)                                                                  Juice: apple, WHITE grape, WHITE cranberry Sports drinks like Gatorade (NO RED)             FOLLOW BOWEL PREP AND ANY ADDITIONAL PRE OP INSTRUCTIONS YOU RECEIVED FROM YOUR SURGEON'S OFFICE!!!   Oral Hygiene is also important to reduce your risk of infection.                                    Remember - BRUSH YOUR TEETH THE MORNING OF SURGERY WITH YOUR REGULAR TOOTHPASTE   Do NOT smoke after Midnight   Take these medicines the morning of surgery with A SIP OF WATER:paroxetine.Use Flonase as usual.Tylenol as needed.   DO NOT TAKE  ANY ORAL DIABETIC MEDICATIONS DAY OF YOUR SURGERY  Bring CPAP mask and tubing day of surgery.                              You may not have any metal on your body including hair pins, jewelry, and  body piercing             Do not wear make-up, lotions, powders, perfumes/cologne, or deodorant  Do not wear nail polish including gel and S&S, artificial/acrylic nails, or any other type of covering on natural nails including finger and toenails. If you have artificial nails, gel coating, etc. that needs to be removed by a nail salon please have this removed prior to surgery or surgery may need to be canceled/ delayed if the surgeon/ anesthesia feels like they are unable to be safely monitored.   Do not shave  48 hours prior to surgery.    Do not bring valuables to the hospital. [place].   Contacts, dentures or bridgework may not be worn into surgery.   Bring small overnight bag day of surgery.   DO NOT [place]. PHARMACY WILL DISPENSE MEDICATIONS LISTED ON YOUR MEDICATION LIST TO YOU DURING YOUR ADMISSION [place]!    Patients discharged on the day of surgery will not be allowed to drive home.  Someone NEEDS to stay with you for the first 24 hours after anesthesia.   Special Instructions: Bring a copy of your healthcare power of attorney and living will documents         the day of surgery if you haven't scanned them before.              Please read over the following fact sheets you were given: IF YOU HAVE QUESTIONS ABOUT YOUR PRE-OP INSTRUCTIONS PLEASE CALL [phone]     [facility] - Preparing for Surgery Before surgery, you can play an important role.  Because skin is not sterile, your skin needs to be as free of germs as possible.  You can reduce the number of germs on your skin by washing with CHG (chlorahexidine gluconate) soap before surgery.  CHG is an antiseptic cleaner which kills germs  and bonds with the skin to continue killing germs even after washing. Please DO NOT use if you have an allergy to CHG or antibacterial soaps.  If your skin becomes reddened/irritated stop using the CHG and inform your nurse when you arrive at Short Stay. Do not shave (including legs and underarms) for at least 48 hours prior to the first CHG shower.  You may shave your face/neck. Please follow these instructions carefully:  1.  Shower with CHG Soap the night before surgery and the  morning of Surgery.  2.  If you choose to wash your hair, wash your hair first as usual with your  normal  shampoo.  3.  After you shampoo, rinse your hair and body thoroughly to remove the  shampoo.                           4.  Use CHG as you would any other liquid soap.  You can apply chg directly  to the skin and wash                       Gently with a scrungie or clean washcloth.  5.  Apply the CHG Soap to your body ONLY FROM THE NECK DOWN.   Do not use on face/ open  Wound or open sores. Avoid contact with eyes, ears mouth and genitals (private parts).                       Wash face,  Genitals (private parts) with your normal soap.             6.  Wash thoroughly, paying special attention to the area where your surgery  will be performed.  7.  Thoroughly rinse your body with warm water from the neck down.  8.  DO NOT shower/wash with your normal soap after using and rinsing off  the CHG Soap.                9.  Pat yourself dry with a clean towel.            10.  Wear clean pajamas.            11.  Place clean sheets on your bed the night of your first shower and do not  sleep with pets. Day of Surgery : Do not apply any lotions/deodorants the morning of surgery.  Please wear clean clothes to the hospital/surgery center.  FAILURE TO FOLLOW THESE INSTRUCTIONS MAY RESULT IN THE CANCELLATION OF YOUR SURGERY PATIENT SIGNATURE_________________________________  NURSE  SIGNATURE__________________________________  ________________________________________________________________________

## 2022-04-28 ENCOUNTER — Ambulatory Visit (INDEPENDENT_AMBULATORY_CARE_PROVIDER_SITE_OTHER): Payer: Medicare Other

## 2022-04-28 VITALS — BP 159/69 | HR 64 | Temp 97.8°F | Resp 16 | Ht 65.0 in | Wt 168.8 lb

## 2022-04-28 DIAGNOSIS — K5130 Ulcerative (chronic) rectosigmoiditis without complications: Secondary | ICD-10-CM

## 2022-04-28 MED ORDER — DIPHENHYDRAMINE HCL 25 MG PO CAPS
25.0000 mg | ORAL_CAPSULE | Freq: Once | ORAL | Status: AC
  Administered 2022-04-28: 25 mg via ORAL
  Filled 2022-04-28: qty 1

## 2022-04-28 MED ORDER — SODIUM CHLORIDE 0.9 % IV SOLN
5.0000 mg/kg | Freq: Once | INTRAVENOUS | Status: AC
  Administered 2022-04-28: 400 mg via INTRAVENOUS
  Filled 2022-04-28: qty 40

## 2022-04-28 MED ORDER — METHYLPREDNISOLONE SODIUM SUCC 40 MG IJ SOLR
40.0000 mg | Freq: Once | INTRAMUSCULAR | Status: AC
  Administered 2022-04-28: 40 mg via INTRAVENOUS
  Filled 2022-04-28: qty 1

## 2022-04-28 MED ORDER — ACETAMINOPHEN 325 MG PO TABS
650.0000 mg | ORAL_TABLET | Freq: Once | ORAL | Status: AC
  Administered 2022-04-28: 650 mg via ORAL
  Filled 2022-04-28: qty 2

## 2022-04-28 NOTE — Progress Notes (Signed)
Diagnosis: Ulcerative Colitis  Provider:  Marshell Garfinkel, MD  Procedure: Infusion  IV Type: Peripheral, IV Location: L Antecubital  Remicade (Infliximab), Dose: 425m  Infusion Start Time: 11792am  Infusion Stop Time: 1146 am  Post Infusion IV Care: Observation period completed and Peripheral IV Discontinued  Discharge: Condition: Good, Destination: Home . AVS provided to patient.   Performed by:  MPaul Dykes RN

## 2022-04-29 ENCOUNTER — Encounter (HOSPITAL_COMMUNITY): Payer: Self-pay

## 2022-04-29 ENCOUNTER — Encounter (HOSPITAL_COMMUNITY)
Admission: RE | Admit: 2022-04-29 | Discharge: 2022-04-29 | Disposition: A | Discharge status: Home / Self Care | Payer: Medicare Other | Source: Ambulatory Visit | Attending: Surgery | Admitting: Surgery

## 2022-04-29 ENCOUNTER — Other Ambulatory Visit: Payer: Self-pay

## 2022-04-29 VITALS — BP 144/80 | HR 70 | Temp 97.8°F | Ht 65.0 in | Wt 167.0 lb

## 2022-04-29 DIAGNOSIS — R079 Chest pain, unspecified: Secondary | ICD-10-CM

## 2022-04-29 DIAGNOSIS — D649 Anemia, unspecified: Secondary | ICD-10-CM | POA: Insufficient documentation

## 2022-04-29 DIAGNOSIS — K279 Peptic ulcer, site unspecified, unspecified as acute or chronic, without hemorrhage or perforation: Secondary | ICD-10-CM | POA: Insufficient documentation

## 2022-04-29 DIAGNOSIS — K5190 Ulcerative colitis, unspecified, without complications: Secondary | ICD-10-CM | POA: Diagnosis not present

## 2022-04-29 DIAGNOSIS — I10 Essential (primary) hypertension: Secondary | ICD-10-CM | POA: Insufficient documentation

## 2022-04-29 HISTORY — DX: Angina pectoris, unspecified: I20.9

## 2022-04-29 HISTORY — DX: Anemia, unspecified: D64.9

## 2022-04-29 HISTORY — DX: Dyspnea, unspecified: R06.00

## 2022-04-29 LAB — CBC
HCT: 38.4 % (ref 36.0–46.0)
Hemoglobin: 12.9 g/dL (ref 12.0–15.0)
MCH: 30.5 pg (ref 26.0–34.0)
MCHC: 33.6 g/dL (ref 30.0–36.0)
MCV: 90.8 fL (ref 80.0–100.0)
Platelets: 220 10*3/uL (ref 150–400)
RBC: 4.23 MIL/uL (ref 3.87–5.11)
RDW: 14.4 % (ref 11.5–15.5)
WBC: 8.7 10*3/uL (ref 4.0–10.5)
nRBC: 0 % (ref 0.0–0.2)

## 2022-04-29 LAB — BASIC METABOLIC PANEL
Anion gap: 7 (ref 5–15)
BUN: 18 mg/dL (ref 8–23)
CO2: 23 mmol/L (ref 22–32)
Calcium: 9.7 mg/dL (ref 8.9–10.3)
Chloride: 108 mmol/L (ref 98–111)
Creatinine, Ser: 0.91 mg/dL (ref 0.44–1.00)
GFR, Estimated: >60 mL/min (ref >60)
Glucose, Bld: 109 mg/dL — ABNORMAL HIGH (ref 70–99)
Potassium: 4 mmol/L (ref 3.5–5.1)
Sodium: 138 mmol/L (ref 135–145)

## 2022-04-29 NOTE — Progress Notes (Signed)
For Short Stay: [place] appointment date: Date of COVID positive in last 71 days:  Bowel Prep reminder:   For Anesthesia: PCP - Dr. Lennette Bihari Via Cardiologist - Dr. Fransico Him  Chest x-ray -  EKG - 01/12/22 Stress Test -  ECHO - 07/18/19 Cardiac Cath - 01/21/22 Pacemaker/ICD device last checked: Pacemaker orders received: Device Rep notified:  Spinal Cord Stimulator:  Sleep Study -  CPAP -   Fasting Blood Sugar -  Checks Blood Sugar _____ times a day Date and result of last Hgb A1c-  Blood Thinner Instructions: Aspirin Instructions: Last Dose:  Activity level: Can go up a flight of stairs and activities of daily living without stopping and without chest pain and/or shortness of breath   Able to exercise without chest pain and/or shortness of breath   Unable to go up a flight of stairs without chest pain and/or shortness of breath     Anesthesia review: Hx: HTN,Chest pain  Patient denies shortness of breath, fever, cough and chest pain at PAT appointment   Patient verbalized understanding of instructions that were given to them at the PAT appointment. Patient was also instructed that they will need to review over the PAT instructions again at home before surgery.

## 2022-04-29 NOTE — Progress Notes (Signed)
Several attempts to reach pharmacy were done by RN,for the med. Reconciliation. Pt. Was instructed to contact the pharmacy to go over medications.

## 2022-04-29 NOTE — Progress Notes (Signed)
For Short Stay: Andrea [name] appointment date: Date of COVID positive in last 72 days:  Bowel Prep reminder:   For Anesthesia: PCP - Dr. Lennette Bihari Via Cardiologist - Dr. Fransico Him  Chest x-ray -  EKG - 01/12/22 Stress Test -  ECHO - 07/18/19 Cardiac Cath - 01/21/22 Pacemaker/ICD device last checked: Pacemaker orders received: Device Rep notified:  Spinal Cord Stimulator:  Sleep Study -  CPAP -   Fasting Blood Sugar -  Checks Blood Sugar _____ times a day Date and result of last Hgb A1c-  Blood Thinner Instructions: Aspirin Instructions: Last Dose:  Activity level: Can go up a flight of stairs and activities of daily living without stopping and without chest pain and/or shortness of breath   Able to exercise without chest pain and/or shortness of breath   Unable to go up a flight of stairs without chest pain and/or shortness of breath     Anesthesia review: Hx: HTN,Chest pain  Patient denies shortness of breath, fever, cough and chest pain at PAT appointment   Patient verbalized understanding of instructions that were given to them at the PAT appointment. Patient was also instructed that they will need to review over the PAT instructions again at home before surgery.

## 2022-04-30 NOTE — Progress Notes (Signed)
Anesthesia Chart Review:   Case: [id number] Date/Time: 05/04/22 0715   Procedure: BIOPSY RECTAL VS EXCISION OF PERIANAL LESION UNDER ANOSCOPY   Anesthesia type: General   Pre-op diagnosis: PERIANAL LESIONS   Location: [facility]   Surgeons: Ileana Roup, MD       DISCUSSION: Pt is 66 years old with hx HTN, ulcerative colitis, PUD, anemia  VS: BP (!) 144/80   Pulse 70   Temp 36.6 C (Oral)   Ht 5' 5"  (1.651 m)   Wt 75.8 kg   SpO2 98%   BMI 27.79 kg/m   PROVIDERS: - PCP is Kristen Loader, FNP - Cardiologist is Fransico Him, MD. Last office visit 02/13/22 with Christen Bame, NP   LABS: Labs reviewed: Acceptable for surgery. (all labs ordered are listed, but only abnormal results are displayed)  Labs Reviewed  BASIC METABOLIC PANEL - Abnormal; Notable for the following components:      Result Value   Glucose, Bld 109 (*)    All other components within normal limits  CBC    EKG 01/12/22: NSR at 75 bpm, no ST/T wave abnormality   CV: Cardiac cath 01/21/22:    Prox RCA lesion is 20% stenosed.   Prox LAD lesion is 20% stenosed.   The left ventricular systolic function is normal.   LV end diastolic pressure is normal.   The left ventricular ejection fraction is 55-65% by visual estimate.  Echo 07/18/19:  1. Left ventricular ejection fraction, by visual estimation, is 60 to 65%. The left ventricle has normal function. Normal left ventricular size. There is no left ventricular hypertrophy.  2. Global right ventricle has normal systolic function.The right ventricular size is normal. No increase in right ventricular wall thickness.  3. Left atrial size was normal.  4. Right atrial size was mildly dilated.  5. Trivial pericardial effusion is present.  6. The mitral valve is normal in structure. No evidence of mitral valve regurgitation. No evidence of mitral stenosis.  7. The tricuspid valve is normal in structure. Tricuspid valve regurgitation is trivial.   8. The aortic valve has an indeterminant number of cusps Aortic valve regurgitation was not visualized by color flow Doppler. Structurally normal aortic valve, with no evidence of sclerosis or stenosis.  9. The pulmonic valve was not well visualized. Pulmonic valve regurgitation is not visualized by color flow Doppler.  10. Normal pulmonary artery systolic pressure.  11. The inferior vena cava is normal in size with greater than 50% respiratory variability, suggesting right atrial pressure of 3 mmHg.    Past Medical History:  Diagnosis Date   Anemia    Anginal pain (HCC)    Arthritis    Chest discomfort    Disorder of immune system ([place])    Dyspnea    Dyspnea on exertion    Hypercholesterolemia    Hypertension    Migraine    Non-ulcer dyspepsia    Osteopenia of spine    Peptic ulcer disease    Pneumonia    Ulcerative colitis ([place])     Past Surgical History:  Procedure Laterality Date   COLONOSCOPY     ESOPHAGOGASTRODUODENOSCOPY     hip replacement Bilateral    LEFT HEART CATH AND CORONARY ANGIOGRAPHY N/A 01/21/2022   Procedure: LEFT HEART CATH AND CORONARY ANGIOGRAPHY;  Surgeon: Burnell Blanks, MD;  Location: [facility];  Service: Cardiovascular;  Laterality: N/A;    MEDICATIONS:  acetaminophen (TYLENOL) 500 MG tablet  atorvastatin (LIPITOR) 40 MG tablet   azaTHIOprine (IMURAN) 50 MG tablet   ezetimibe (ZETIA) 10 MG tablet   fluticasone (FLONASE) 50 MCG/ACT nasal spray   Glucosamine HCl (GLUCOSAMINE PO)   inFLIXimab (REMICADE) 100 MG injection   nitroGLYCERIN (NITROSTAT) 0.4 MG SL tablet   PARoxetine (PAXIL) 20 MG tablet   triamterene-hydrochlorothiazide (MAXZIDE-25) 37.5-25 MG tablet   TURMERIC PO   No current facility-administered medications for this encounter.    If no changes, I anticipate pt can proceed with surgery as scheduled.   Willeen Cass, PhD, FNP-BC [facility]/Anesthesiology Phone: [phone] 04/30/2022  8:16 AM

## 2022-04-30 NOTE — Anesthesia Preprocedure Evaluation (Addendum)
Anesthesia Evaluation  Patient identified by MRN, date of birth, ID band Patient awake    Reviewed: Allergy & Precautions, NPO status , Patient's Chart, lab work & pertinent test results  Airway Mallampati: II  TM Distance: >3 FB Neck ROM: Full    Dental no notable dental hx.    Pulmonary shortness of breath,    Pulmonary exam normal breath sounds clear to auscultation       Cardiovascular hypertension, Pt. on medications + angina Normal cardiovascular exam Rhythm:Regular Rate:Normal     Neuro/Psych  Headaches, negative psych ROS   GI/Hepatic Neg liver ROS, PUD,   Endo/Other  negative endocrine ROS  Renal/GU negative Renal ROS  negative genitourinary   Musculoskeletal  (+) Arthritis , Osteoarthritis,    Abdominal   Peds negative pediatric ROS (+)  Hematology  (+) Blood dyscrasia, anemia ,   Anesthesia Other Findings   Reproductive/Obstetrics negative OB ROS                            Anesthesia Physical Anesthesia Plan  ASA: 2  Anesthesia Plan: General   Post-op Pain Management: Dilaudid IV and Tylenol PO (pre-op)*   Induction: Intravenous  PONV Risk Score and Plan: 3 and Ondansetron, Dexamethasone, Midazolam and Treatment may vary due to age or medical condition  Airway Management Planned: Oral ETT  Additional Equipment:   Intra-op Plan:   Post-operative Plan: Extubation in OR  Informed Consent: I have reviewed the patients History and Physical, chart, labs and discussed the procedure including the risks, benefits and alternatives for the proposed anesthesia with the patient or authorized representative who has indicated his/her understanding and acceptance.     Dental advisory given  Plan Discussed with: CRNA  Anesthesia Plan Comments: (See APP note by Durel Salts, FNP )       Anesthesia Quick Evaluation

## 2022-05-04 ENCOUNTER — Ambulatory Visit (HOSPITAL_BASED_OUTPATIENT_CLINIC_OR_DEPARTMENT_OTHER): Payer: Medicare Other | Admitting: Anesthesiology

## 2022-05-04 ENCOUNTER — Ambulatory Visit (HOSPITAL_COMMUNITY): Payer: Medicare Other | Admitting: Emergency Medicine

## 2022-05-04 ENCOUNTER — Ambulatory Visit (HOSPITAL_COMMUNITY)
Admission: RE | Admit: 2022-05-04 | Discharge: 2022-05-04 | Disposition: A | Discharge status: Home / Self Care | Payer: Medicare Other | Attending: Surgery | Admitting: Surgery

## 2022-05-04 ENCOUNTER — Other Ambulatory Visit: Payer: Self-pay

## 2022-05-04 ENCOUNTER — Encounter (HOSPITAL_COMMUNITY)
Admission: RE | Disposition: A | Discharge status: Home / Self Care | Payer: Self-pay | Source: Home / Self Care | Attending: Surgery

## 2022-05-04 ENCOUNTER — Encounter (HOSPITAL_COMMUNITY): Payer: Self-pay | Admitting: Surgery

## 2022-05-04 DIAGNOSIS — K6289 Other specified diseases of anus and rectum: Secondary | ICD-10-CM

## 2022-05-04 DIAGNOSIS — R87613 High grade squamous intraepithelial lesion on cytologic smear of cervix (HGSIL): Secondary | ICD-10-CM | POA: Diagnosis not present

## 2022-05-04 DIAGNOSIS — K5190 Ulcerative colitis, unspecified, without complications: Secondary | ICD-10-CM | POA: Insufficient documentation

## 2022-05-04 DIAGNOSIS — I10 Essential (primary) hypertension: Secondary | ICD-10-CM | POA: Insufficient documentation

## 2022-05-04 DIAGNOSIS — Z8719 Personal history of other diseases of the digestive system: Secondary | ICD-10-CM | POA: Diagnosis not present

## 2022-05-04 HISTORY — PX: RECTAL BIOPSY: SHX2303

## 2022-05-04 SURGERY — BIOPSY, RECTUM
Anesthesia: General

## 2022-05-04 MED ORDER — PROPOFOL 10 MG/ML IV BOLUS
INTRAVENOUS | Status: DC | PRN
  Administered 2022-05-04: 140 mg via INTRAVENOUS

## 2022-05-04 MED ORDER — DEXAMETHASONE SODIUM PHOSPHATE 10 MG/ML IJ SOLN
INTRAMUSCULAR | Status: AC
  Filled 2022-05-04: qty 1

## 2022-05-04 MED ORDER — FENTANYL CITRATE (PF) 100 MCG/2ML IJ SOLN
INTRAMUSCULAR | Status: AC
  Filled 2022-05-04: qty 2

## 2022-05-04 MED ORDER — SCOPOLAMINE 1 MG/3DAYS TD PT72
MEDICATED_PATCH | TRANSDERMAL | Status: DC | PRN
  Administered 2022-05-04: 1 via TRANSDERMAL

## 2022-05-04 MED ORDER — LABETALOL HCL 5 MG/ML IV SOLN
10.0000 mg | Freq: Once | INTRAVENOUS | Status: AC
  Administered 2022-05-04: 10 mg via INTRAVENOUS

## 2022-05-04 MED ORDER — ORAL CARE MOUTH RINSE: 15.0000 mL | Freq: Once | OROMUCOSAL | Status: AC

## 2022-05-04 MED ORDER — BUPIVACAINE LIPOSOME 1.3 % IJ SUSP
INTRAMUSCULAR | Status: DC | PRN
  Administered 2022-05-04: 10 mL

## 2022-05-04 MED ORDER — SCOPOLAMINE 1 MG/3DAYS TD PT72
MEDICATED_PATCH | TRANSDERMAL | Status: AC
  Filled 2022-05-04: qty 1

## 2022-05-04 MED ORDER — LIDOCAINE 2% (20 MG/ML) 5 ML SYRINGE
INTRAMUSCULAR | Status: DC | PRN
  Administered 2022-05-04: 100 mg via INTRAVENOUS

## 2022-05-04 MED ORDER — AMISULPRIDE (ANTIEMETIC) 5 MG/2ML IV SOLN: 10.0000 mg | Freq: Once | INTRAVENOUS | Status: DC | PRN

## 2022-05-04 MED ORDER — CHLORHEXIDINE GLUCONATE 0.12 % MT SOLN
15.0000 mL | Freq: Once | OROMUCOSAL | Status: AC
  Administered 2022-05-04: 15 mL via OROMUCOSAL

## 2022-05-04 MED ORDER — BUPIVACAINE LIPOSOME 1.3 % IJ SUSP
INTRAMUSCULAR | Status: AC
Start: 2022-05-04 — End: ?
  Filled 2022-05-04: qty 20

## 2022-05-04 MED ORDER — ROCURONIUM BROMIDE 10 MG/ML (PF) SYRINGE
PREFILLED_SYRINGE | INTRAVENOUS | Status: DC | PRN
  Administered 2022-05-04: 40 mg via INTRAVENOUS

## 2022-05-04 MED ORDER — ONDANSETRON HCL 4 MG/2ML IJ SOLN
INTRAMUSCULAR | Status: AC
  Filled 2022-05-04: qty 2

## 2022-05-04 MED ORDER — BUPIVACAINE-EPINEPHRINE 0.5% -1:200000 IJ SOLN
INTRAMUSCULAR | Status: DC | PRN
  Administered 2022-05-04: 20 mL

## 2022-05-04 MED ORDER — LACTATED RINGERS IV SOLN: INTRAVENOUS | Status: DC

## 2022-05-04 MED ORDER — DEXAMETHASONE SODIUM PHOSPHATE 10 MG/ML IJ SOLN
INTRAMUSCULAR | Status: DC | PRN
  Administered 2022-05-04: 10 mg via INTRAVENOUS

## 2022-05-04 MED ORDER — OXYCODONE HCL 5 MG/5ML PO SOLN: 5.0000 mg | Freq: Once | ORAL | Status: DC | PRN

## 2022-05-04 MED ORDER — ROCURONIUM BROMIDE 10 MG/ML (PF) SYRINGE
PREFILLED_SYRINGE | INTRAVENOUS | Status: AC
  Filled 2022-05-04: qty 10

## 2022-05-04 MED ORDER — MEPERIDINE HCL 50 MG/ML IJ SOLN: 6.2500 mg | INTRAMUSCULAR | Status: DC | PRN

## 2022-05-04 MED ORDER — DEXAMETHASONE SODIUM PHOSPHATE 10 MG/ML IJ SOLN
INTRAMUSCULAR | Status: AC
Start: 2022-05-04 — End: ?
  Filled 2022-05-04: qty 1

## 2022-05-04 MED ORDER — ACETAMINOPHEN 500 MG PO TABS
1000.0000 mg | ORAL_TABLET | ORAL | Status: AC
  Administered 2022-05-04: 1000 mg via ORAL
  Filled 2022-05-04: qty 2

## 2022-05-04 MED ORDER — MIDAZOLAM HCL 2 MG/2ML IJ SOLN
INTRAMUSCULAR | Status: AC
Start: 2022-05-04 — End: ?
  Filled 2022-05-04: qty 2

## 2022-05-04 MED ORDER — PROPOFOL 10 MG/ML IV BOLUS
INTRAVENOUS | Status: AC
  Filled 2022-05-04: qty 20

## 2022-05-04 MED ORDER — EPHEDRINE SULFATE-NACL 50-0.9 MG/10ML-% IV SOSY
PREFILLED_SYRINGE | INTRAVENOUS | Status: DC | PRN
  Administered 2022-05-04: 10 mg via INTRAVENOUS

## 2022-05-04 MED ORDER — HYDROMORPHONE HCL 1 MG/ML IJ SOLN: 0.2500 mg | INTRAMUSCULAR | Status: DC | PRN

## 2022-05-04 MED ORDER — PROMETHAZINE HCL 25 MG/ML IJ SOLN: 6.2500 mg | INTRAMUSCULAR | Status: DC | PRN

## 2022-05-04 MED ORDER — TRAMADOL HCL 50 MG PO TABS: 50.0000 mg | ORAL_TABLET | Freq: Four times a day (QID) | ORAL | 0 refills | Status: AC | PRN

## 2022-05-04 MED ORDER — DIBUCAINE (PERIANAL) 1 % EX OINT
TOPICAL_OINTMENT | CUTANEOUS | Status: AC
Start: 2022-05-04 — End: ?
  Filled 2022-05-04: qty 28

## 2022-05-04 MED ORDER — DEXMEDETOMIDINE HCL IN NACL 80 MCG/20ML IV SOLN
INTRAVENOUS | Status: AC
  Filled 2022-05-04: qty 20

## 2022-05-04 MED ORDER — ONDANSETRON HCL 4 MG/2ML IJ SOLN
INTRAMUSCULAR | Status: DC | PRN
  Administered 2022-05-04: 4 mg via INTRAVENOUS

## 2022-05-04 MED ORDER — METHYLENE BLUE 1 % INJ SOLN
INTRAVENOUS | Status: AC
  Filled 2022-05-04: qty 10

## 2022-05-04 MED ORDER — LIDOCAINE 2% (20 MG/ML) 5 ML SYRINGE
INTRAMUSCULAR | Status: AC
  Filled 2022-05-04: qty 5

## 2022-05-04 MED ORDER — BUPIVACAINE LIPOSOME 1.3 % IJ SUSP: 20.0000 mL | Freq: Once | INTRAMUSCULAR | Status: DC

## 2022-05-04 MED ORDER — OXYCODONE HCL 5 MG PO TABS: 5.0000 mg | ORAL_TABLET | Freq: Once | ORAL | Status: DC | PRN

## 2022-05-04 MED ORDER — EPHEDRINE 5 MG/ML INJ
INTRAVENOUS | Status: AC
  Filled 2022-05-04: qty 5

## 2022-05-04 MED ORDER — SUGAMMADEX SODIUM 200 MG/2ML IV SOLN
INTRAVENOUS | Status: DC | PRN
  Administered 2022-05-04: 200 mg via INTRAVENOUS

## 2022-05-04 MED ORDER — ROCURONIUM BROMIDE 10 MG/ML (PF) SYRINGE
PREFILLED_SYRINGE | INTRAVENOUS | Status: AC
Start: 2022-05-04 — End: ?
  Filled 2022-05-04: qty 10

## 2022-05-04 MED ORDER — LABETALOL HCL 5 MG/ML IV SOLN
INTRAVENOUS | Status: DC
Start: 2022-05-04 — End: 2022-05-04
  Filled 2022-05-04: qty 4

## 2022-05-04 MED ORDER — MIDAZOLAM HCL 5 MG/5ML IJ SOLN
INTRAMUSCULAR | Status: DC | PRN
  Administered 2022-05-04: 2 mg via INTRAVENOUS

## 2022-05-04 MED ORDER — FENTANYL CITRATE (PF) 100 MCG/2ML IJ SOLN
INTRAMUSCULAR | Status: DC | PRN
Start: 2022-05-04 — End: 2022-05-04
  Administered 2022-05-04: 100 ug via INTRAVENOUS

## 2022-05-04 MED ORDER — BUPIVACAINE-EPINEPHRINE (PF) 0.5% -1:200000 IJ SOLN
INTRAMUSCULAR | Status: AC
  Filled 2022-05-04: qty 30

## 2022-05-04 SURGICAL SUPPLY — 39 items
BAG COUNTER SPONGE SURGICOUNT (BAG) ×1 IMPLANT
BENZOIN TINCTURE PRP APPL 2/3 (GAUZE/BANDAGES/DRESSINGS) ×2 IMPLANT
BLADE SURG 15 STRL LF DISP TIS (BLADE) IMPLANT
BLADE SURG 15 STRL SS (BLADE)
CNTNR URN SCR LID CUP LEK RST (MISCELLANEOUS) ×1 IMPLANT
CONT SPEC 4OZ STRL OR WHT (MISCELLANEOUS) ×1
COVER SURGICAL LIGHT HANDLE (MISCELLANEOUS) ×2 IMPLANT
DRAPE LAPAROTOMY T 102X78X121 (DRAPES) ×2 IMPLANT
DRSG PAD ABDOMINAL 8X10 ST (GAUZE/BANDAGES/DRESSINGS) ×1 IMPLANT
ELECT NDL BLADE 2-5/6 (NEEDLE) ×1 IMPLANT
ELECT NEEDLE BLADE 2-5/6 (NEEDLE) ×2 IMPLANT
ELECT REM PT RETURN 15FT ADLT (MISCELLANEOUS) ×2 IMPLANT
GAUZE 4X4 16PLY ~~LOC~~+RFID DBL (SPONGE) ×2 IMPLANT
GAUZE SPONGE 4X4 12PLY STRL (GAUZE/BANDAGES/DRESSINGS) ×1 IMPLANT
GLOVE BIO SURGEON STRL SZ7.5 (GLOVE) ×2 IMPLANT
GLOVE INDICATOR 8.0 STRL GRN (GLOVE) ×2 IMPLANT
GOWN STRL REUS W/ TWL XL LVL3 (GOWN DISPOSABLE) ×2 IMPLANT
GOWN STRL REUS W/TWL XL LVL3 (GOWN DISPOSABLE) ×2
KIT BASIN OR (CUSTOM PROCEDURE TRAY) ×2 IMPLANT
KIT TURNOVER KIT A (KITS) IMPLANT
LOOP VESSEL MAXI BLUE (MISCELLANEOUS) IMPLANT
NEEDLE HYPO 22GX1.5 SAFETY (NEEDLE) ×2 IMPLANT
PACK BASIC VI WITH GOWN DISP (CUSTOM PROCEDURE TRAY) ×2 IMPLANT
PANTS MESH DISP LRG (UNDERPADS AND DIAPERS) ×2 IMPLANT
PENCIL SMOKE EVACUATOR (MISCELLANEOUS) IMPLANT
PUNCH BIOPSY 4MM (MISCELLANEOUS) ×2
PUNCH BIOPSY DISP 4 (MISCELLANEOUS) IMPLANT
SHEARS HARMONIC 9CM CVD (BLADE) IMPLANT
SPIKE FLUID TRANSFER (MISCELLANEOUS) ×2 IMPLANT
SURGILUBE 2OZ TUBE FLIPTOP (MISCELLANEOUS) ×2 IMPLANT
SUT CHROMIC 2 0 SH (SUTURE) ×2 IMPLANT
SUT CHROMIC 3 0 SH 27 (SUTURE) ×2 IMPLANT
SUT VIC AB 2-0 SH 27 (SUTURE)
SUT VIC AB 2-0 SH 27X BRD (SUTURE) IMPLANT
SUT VIC AB 2-0 UR6 27 (SUTURE) ×12 IMPLANT
SYR 20ML LL LF (SYRINGE) ×2 IMPLANT
SYR 3ML LL SCALE MARK (SYRINGE) IMPLANT
TOWEL OR 17X26 10 PK STRL BLUE (TOWEL DISPOSABLE) ×2 IMPLANT
TOWEL OR NON WOVEN STRL DISP B (DISPOSABLE) ×2 IMPLANT

## 2022-05-04 NOTE — Anesthesia Procedure Notes (Signed)
Procedure Name: Intubation Date/Time: 05/04/2022 7:27 AM  Performed by: Lind Covert, CRNAPre-anesthesia Checklist: Patient identified, Emergency Drugs available, Suction available, Patient being monitored and Timeout performed Patient Re-evaluated:Patient Re-evaluated prior to induction Oxygen Delivery Method: Circle system utilized Preoxygenation: Pre-oxygenation with 100% oxygen Induction Type: IV induction Ventilation: Mask ventilation without difficulty Laryngoscope Size: Mac and 3 Grade View: Grade I Tube type: Oral Tube size: 7.0 mm Number of attempts: 1 Airway Equipment and Method: Stylet Placement Confirmation: ETT inserted through vocal cords under direct vision, positive ETCO2 and breath sounds checked- equal and bilateral Secured at: 22 cm Tube secured with: Tape Dental Injury: Teeth and Oropharynx as per pre-operative assessment

## 2022-05-04 NOTE — Op Note (Signed)
05/04/2022  8:21 AM  PATIENT:  Andrea [name]  66 y.o. female  Patient Care Team: Kristen Loader, FNP as PCP - General (Family Medicine) Sueanne Margarita, MD as PCP - Cardiology (Cardiology)  PRE-OPERATIVE DIAGNOSIS:  Abnormal anal canal tissue, abnormal anal margin tissue; history of ulcerative colitis  POST-OPERATIVE DIAGNOSIS:  Same  PROCEDURE:   Transanal excision of posterior midline anal canal lesion under endoscopic guidance Punch biopsy x2 of perianal skin, left lateral Anorectal exam under anesthesia  SURGEON:  Surgeon(s): Ileana Roup, MD  ASSISTANT: OR staff   ANESTHESIA:   local and general  SPECIMEN:   Posterior midline anal canal lesion Left lateral anal margin skin  DISPOSITION OF SPECIMEN:  PATHOLOGY  COUNTS:  Sponge, needle, and instrument counts were reported correct x2 at conclusion.  EBL: 5 mL  Drains: None  PLAN OF CARE: Discharge to home after PACU  PATIENT DISPOSITION:  PACU - hemodynamically stable.  OR FINDINGS: Posterior midline Venkat Ankney plaque anal lesion 1 x 1 cm in size, visualized under anoscopy - fully excised. Very sharp margin area of redness at junction of the anoderm and anal margin. This was punch biopsied x 2 including portion of normal skin, submitted as separate specimen from posterior midline tissue. Small internal hemorrhoids.  DESCRIPTION: The patient was identified in the preoperative holding area and taken to the OR. SCDs were applied. She then underwent general endotracheal anesthesia without difficulty. The patient was then rolled onto the OR table in the prone jackknife position. Pressure points were then evaluated and padded. Benzoin was applied to the buttocks and they were gently taped apart.  She was then prepped and draped in usual sterile fashion.  A surgical timeout was performed indicating the correct patient, procedure, and positioning.  A perianal block was then created using a dilute mixture of 0.25% Marcaine  with epinephrine and Exparel.  After ascertaining an appropriate level of anesthesia had been achieved, a well lubricated digital rectal exam was performed. This demonstrated normal DRE without palpable masses.  A Hill-Ferguson anoscope was into the anal canal and circumferential inspection demonstrated Cash[place] Dingley plaque type lesion in the posterior midline.  Small internal hemorrhoids.  No other acute or chronic appearing findings.  Unclear significance of this lesion in the posterior midline.  Therefore, preparations were made for excision.  This was done with an anoscope in place.  The lesion is elevated with a DeBakey forcep.  Margins of excision are marked with cautery such that this lesion will be fully excised.  The lesion is then excised sharply.  This is passed off the specimen.  Hemostasis is then achieved with electrocautery.  The resultant small defect is closed using 3-0 chromic sutures.  The anal canal was irrigated and hemostasis verified.  There is a sharp demarcation of tissue between the junction of the anoderm and the anal margin.  This is external to the anal sphincter proper.  It is red and mildly raised.  Given the appearance, we opted to proceed with biopsy of this tissue.  A 4 mm punch biopsy tool was used to take 2 separate cores from the left lateral position.  These were passed off the specimen.  No sphincter muscle was divided.  This specimen does include a rim of normal-appearing anal margin skin as well.  Hemostasis is achieved with electrocautery.  The 2 small punch biopsy holes were then approximated with 3-0 chromic suture.  Additional local anesthetic is then infiltrated.  The anal canal was reinspected  and hemostasis verified.  The buttocks are untaped and a dressing consisting of 4 x 4's, ABD, and mesh underwear was placed.  She is then rolled back onto a stretcher, wake from anesthesia, extubated, and transferred to a stretcher for transport to PACU in satisfactory  condition.  DISPOSITION: PACU in satisfactory condition.

## 2022-05-04 NOTE — Transfer of Care (Signed)
Immediate Anesthesia Transfer of Care Note  Patient: Andrea [name]  Procedure(s) Performed: BIOPSY RECTAL VS EXCISION OF PERIANAL LESION UNDER ANOSCOPY  Patient Location: PACU  Anesthesia Type:General  Level of Consciousness: sedated  Airway & Oxygen Therapy: Patient Spontanous Breathing and Patient connected to face mask oxygen  Post-op Assessment: Report given to RN and Post -op Vital signs reviewed and stable  Post vital signs: Reviewed and stable  Last Vitals:  Vitals Value Taken Time  BP 193/63 05/04/22 0832  Temp 36.4 C 05/04/22 0832  Pulse 99 05/04/22 0833  Resp 24 05/04/22 0833  SpO2 97 % 05/04/22 0833  Vitals shown include unvalidated device data.  Last Pain:  Vitals:   05/04/22 0623  TempSrc:   PainSc: 0-No pain         Complications: No notable events documented.

## 2022-05-04 NOTE — Anesthesia Postprocedure Evaluation (Signed)
Anesthesia Post Note  Patient: COURTNAY PETRILLA  Procedure(s) Performed: BIOPSY RECTAL VS EXCISION OF PERIANAL LESION UNDER ANOSCOPY     Patient location during evaluation: PACU Anesthesia Type: General Level of consciousness: awake and alert Pain management: pain level controlled Vital Signs Assessment: post-procedure vital signs reviewed and stable Respiratory status: spontaneous breathing, nonlabored ventilation and respiratory function stable Cardiovascular status: blood pressure returned to baseline and stable Postop Assessment: no apparent nausea or vomiting Anesthetic complications: no   No notable events documented.  Last Vitals:  Vitals:   05/04/22 0900 05/04/22 0918  BP: (!) 148/72 (!) 164/80  Pulse: 75 69  Resp: 15 16  Temp: (!) 36.4 C 36.4 C  SpO2: 93% 95%    Last Pain:  Vitals:   05/04/22 0918  TempSrc:   PainSc: 0-No pain                 Lynda Rainwater

## 2022-05-04 NOTE — H&P (Signed)
CC: Here today for surgery  HPI: Andrea [name] is an 66 y.o. female with history of UC (follows with Dr. Loletha Carrow and is on Remicade/Azathioprine) -he was referred to see [place] for some anal irritation/Andrea [name] like skin changes noted on examination in the office with them back 11/2021. She was first diagnosed with ulcerative colitis back in March 2005.  Last colonoscopy 06/2020 showed a small 4 mm rectal hyperplastic polyp and sigmoid diverticulosis. She was noted to be stable on infliximab as well as her low-dose azathioprine. She was seen in their office back in March with a several week history of perianal irritation when she has bowel movements. She was taking a stool softener at that time. She describes an itching/burning sensation. She has tried a variety of over-the-counter topical ointments including Neosporin, Vaseline, Preparation H, Aquaphor.  She reports that she first noticed all the symptoms somewhere back in December/January and now having had them for 5 to 6 months. They have not gotten better nor worse. Nothing seems to be progressive at this juncture. She denies any issue with incontinence to gas, liquid, or solid stool.  Past Medical History:  Diagnosis Date   Anemia    Anginal pain (HCC)    Arthritis    Chest discomfort    Disorder of immune system ([place])    Dyspnea    Dyspnea on exertion    Hypercholesterolemia    Hypertension    Migraine    Non-ulcer dyspepsia    Osteopenia of spine    Peptic ulcer disease    Pneumonia    Ulcerative colitis ([place])     Past Surgical History:  Procedure Laterality Date   ANKLE FRACTURE SURGERY Left    COLONOSCOPY     ESOPHAGOGASTRODUODENOSCOPY     hip replacement Bilateral    LEFT HEART CATH AND CORONARY ANGIOGRAPHY N/A 01/21/2022   Procedure: LEFT HEART CATH AND CORONARY ANGIOGRAPHY;  Surgeon: Burnell Blanks, MD;  Location: [facility];  Service: Cardiovascular;  Laterality: N/A;    Family History  Problem  Relation Age of Onset   CAD Mother 59   Heart attack Mother    Heart attack Father 61   CAD Brother 23   Skin cancer Brother    Other Son        gluten sensitivity   Colon cancer Neg Hx     Social:  reports that she has never smoked. She has never used smokeless tobacco. She reports that she does not drink alcohol and does not use drugs.  Allergies:  Allergies  Allergen Reactions   Codeine Nausea And Vomiting    Medications: I have reviewed the patient's current medications.  No results found for this or any previous visit (from the past 48 hour(s)).  No results found.  ROS - all of the below systems have been reviewed with the patient and positives are indicated with bold text General: chills, fever or night sweats Eyes: blurry vision or double vision ENT: epistaxis or sore throat Allergy/Immunology: itchy/watery eyes or nasal congestion Hematologic/Lymphatic: bleeding problems, blood clots or swollen lymph nodes Endocrine: temperature intolerance or unexpected weight changes Breast: new or changing breast lumps or nipple discharge Resp: cough, shortness of breath, or wheezing CV: chest pain or dyspnea on exertion GI: as per HPI GU: dysuria, trouble voiding, or hematuria MSK: joint pain or joint stiffness Neuro: TIA or stroke symptoms Derm: pruritus and skin lesion changes Psych: anxiety and depression  PE Blood pressure (!) 155/86, pulse  63, temperature (!) 97.5 F (36.4 C), temperature source Oral, resp. rate 18, SpO2 97 %. Constitutional: NAD; conversant Eyes: Moist conjunctiva; no lid lag Lungs: Normal respiratory effort CV: RRR; no pitting edema MSK: Normal range of motion of extremities Psychiatric: Appropriate affect; alert and oriented x3  No results found for this or any previous visit (from the past 48 hour(s)).  No results found.  A/P: Andrea [name] is an 66 y.o. female with hx of longstanding UC here for evaluation of anal irritation/burning and  pruritus like symptoms. Examination does show a lesion closer to the posterior position with well delineated margins.  -Given constellation of findings, I think further evaluation of this area is necessary. This would be with pathologic confirmation to rule out things like perianal Paget's disease or AIN  -In the interim, we spent time discussing strategies to manage pruritus ani. We discussed starting a fiber supplement like Metamucil or Benefiber to help bind oil in the stool. Topical Calmoseptine application and sample is being provided. We discussed minimizing or eliminating acidic foods such as tomato based products, coffee, beer, nuts. We also discussed the itch scratch cycle and eliminating all itching if at all possible.  -The anatomy and physiology of the anal canal was discussed with the patient today. The pathophysiology of pruritus ani as well as perianal Paget's disease was discussed as well  -We have reviewed options going forward and recommended anorectal exam under anesthesia with biopsy vs excision of perianal lesions based on intraoperative appearance  -The planned procedures, material risks (including, but not limited to, pain, bleeding, infection, scarring, need for blood transfusion, damage to anal sphincter, incontinence of gas and/or stool, need for additional procedures, recurrence, pneumonia, heart attack, stroke, death) benefits and alternatives to surgery were discussed at length. The patient's questions were answered to her satisfaction, she voiced understanding and elected to proceed with surgery. Additionally, we discussed typical postoperative expectations and the recovery process.   Nadeen Landau, [facility], [place]

## 2022-05-04 NOTE — Discharge Instructions (Signed)
ANORECTAL SURGERY: POST OP INSTRUCTIONS  DIET: Follow a light bland diet the first 24 hours after arrival home, such as soup, liquids, crackers, etc.  Be sure to include lots of fluids daily.  Avoid fast food or heavy meals as your are more likely to get nauseated.  Eat a low fat diet the next few days after surgery.   Some bleeding with bowel movements is expected for the first couple of days but this should stop in between bowel movements  Take your usually prescribed home medications unless otherwise directed. No foreign bodies per rectum for the next 3 months (enemas, etc)  PAIN CONTROL: It is helpful to take an over-the-counter pain medication regularly for the first few days/weeks.  Choose from the following that works best for you: Ibuprofen (Advil, etc) Three 270m tabs every 6 hours as needed. Acetaminophen (Tylenol, etc) 500-6534mevery 6 hours as needed NOTE: You may take both of these medications together - most patients find it most helpful when alternating between the two (i.e. Ibuprofen at 6am, tylenol at 9am, ibuprofen at 12pm ...)Marland KitchenA  prescription for pain medication may have been prescribed for you at discharge.  Take your pain medication as prescribed.  If you are having problems/concerns with the prescription medicine, please call usKoreaor further advice.  Avoid getting constipated.  Between the surgery and the pain medications, it is common to experience some constipation.  Increasing fluid intake (64oz of water per day) and taking a fiber supplement (such as Metamucil, Citrucel, FiberCon) 1-2 times a day regularly will usually help prevent this problem from occurring.  Take Miralax (over the counter) 1-2x/day while taking a narcotic pain medication. If no bowel movement after 48hours, you may additionally take a laxative like a bottle of Milk of Magnesia which can be purchased over the counter. Avoid enemas.   Watch out for diarrhea.  If you have many loose bowel movements,  simplify your diet to bland foods.  Stop any stool softeners and decrease your fiber supplement. If this worsens or does not improve, please call usKorea Wash / shower every day.  If you were discharged with a dressing, you may remove this the day after your surgery. You may shower normally, getting soap/water on your wound, particularly after bowel movements.  Soaking in a warm bath filled a couple inches ("Sitz bath") is a great way to clean the area after a bowel movement and many patients find it is a way to soothe the area.  ACTIVITIES as tolerated:   You may resume regular (light) daily activities beginning the next day--such as daily self-care, walking, climbing stairs--gradually increasing activities as tolerated.  If you can walk 30 minutes without difficulty, it is safe to try more intense activity such as jogging, treadmill, bicycling, low-impact aerobics, etc. Refrain from any heavy lifting or straining for the first 2 weeks after your procedure, particularly if your surgery was for hemorrhoids. Avoid activities that make your pain worse You may drive when you are no longer taking prescription pain medication, you can comfortably wear a seatbelt, and you can safely maneuver your car and apply brakes.  FOLLOW UP in our office Please call [facility] at [phone] to set up an appointment to see your surgeon in the office for a follow-up appointment approximately 2 weeks after your surgery. Make sure that you call for this appointment the day you arrive home to insure a convenient appointment time.  9. If you have disability or family leave forms  that need to be completed, you may have them completed by your primary care physician's office; for return to work instructions, please ask our office staff and they will be happy to assist you in obtaining this documentation   When to call us [phone]: Poor pain control Reactions / problems with new medications (rash/itching, etc)  Fever over  101.5 F (38.5 C) Inability to urinate Nausea/vomiting Worsening swelling or bruising Continued bleeding from incision. Increased pain, redness, or drainage from the incision  The clinic staff is available to answer your questions during regular business hours (8:30am-5pm).  Please don't hesitate to call and ask to speak to one of our nurses for clinical concerns.   A surgeon from [facility] is always on call at the hospitals   If you have a medical emergency, go to the nearest emergency room or call 911.   [facility] [address], [place], [place], [place]  [address] MAIN: [phone] FAX: [phone] [email or web address]

## 2022-05-05 ENCOUNTER — Encounter (HOSPITAL_COMMUNITY): Payer: Self-pay | Admitting: Surgery

## 2022-05-05 LAB — SURGICAL PATHOLOGY

## 2022-06-24 ENCOUNTER — Ambulatory Visit (INDEPENDENT_AMBULATORY_CARE_PROVIDER_SITE_OTHER): Payer: Medicare Other

## 2022-06-24 VITALS — BP 149/75 | HR 63 | Temp 98.3°F | Resp 18 | Ht 65.0 in | Wt 169.6 lb

## 2022-06-24 DIAGNOSIS — K5130 Ulcerative (chronic) rectosigmoiditis without complications: Secondary | ICD-10-CM

## 2022-06-24 MED ORDER — DIPHENHYDRAMINE HCL 25 MG PO CAPS: 25.0000 mg | ORAL_CAPSULE | Freq: Once | ORAL | Status: DC

## 2022-06-24 MED ORDER — SODIUM CHLORIDE 0.9 % IV SOLN
5.0000 mg/kg | Freq: Once | INTRAVENOUS | Status: AC
  Administered 2022-06-24: 400 mg via INTRAVENOUS
  Filled 2022-06-24: qty 40

## 2022-06-24 MED ORDER — METHYLPREDNISOLONE SODIUM SUCC 40 MG IJ SOLR: 40.0000 mg | Freq: Once | INTRAMUSCULAR | Status: DC

## 2022-06-24 MED ORDER — ACETAMINOPHEN 325 MG PO TABS: 650.0000 mg | ORAL_TABLET | Freq: Once | ORAL | Status: DC

## 2022-06-24 NOTE — Progress Notes (Signed)
Diagnosis: Crohn's Disease  Provider:  Marshell Garfinkel MD  Procedure: Infusion  IV Type: Peripheral, IV Location: L Forearm  Remicade (Infliximab), Dose: 479m  Infusion Start Time: 09542 Infusion Stop Time: 14814 Post Infusion IV Care: Peripheral IV Discontinued  Discharge: Condition: Good, Destination: Home . AVS provided to patient.   Performed by:  CKoren Shiver RN

## 2022-08-16 NOTE — Progress Notes (Signed)
Cardiology Office Note:    Date:  08/17/2022   ID:  [name], DOB 1956-08-25, MRN [id number]  PCP:  Kristen Loader, FNP   [facility] Providers Cardiologist:  Fransico Him, MD     Referring MD: Dineen Kid, MD   Chief Complaint: follow-up s/p cardiac catheterization  History of Present Illness:    Andrea [name] is a very pleasant 66 y.o. female with a hx of chest pain, hypertension, hyperlipidemia, ulcerative colitis, and PUD.   She was referred to cardiology by PCP for evaluation of chest pain and seen on 07/06/2019.  Coronary CTA revealed calcium score of 190, 89th percentile for age and sex matched control, mild nonobstructive CAD in the proximal portion of LAD, RCA, and OM1.  Lexiscan Myoview was ordered to rule out ischemia and it revealed no evidence of prior infarct or ischemia.  Echo 07/18/2019 revealed LVEF 60 to 65%, no LVH, normal RV, normal valve function.   At her office visit on 01/12/22, she reported increasing episodes of midsternal chest tightness that occur when she is walking accompanied by shortness of breath. Left heart cath on 01/21/22 revealed 20% stenosis proximal RCA and proximal LAD vessels, normal LVEDP, LVEF 55-65%.   Seen back by Christen Bame, NP 01/2022 and continued to have some midsternal chest tightness felt 2/2  rib fracture following a fall that occurred while playing pickle ball.    She is here today for followup and is doing well.  She tells me that rarely she will have some tightness in her chest and will feel SOB that lasts 3-4 min and resolves.  Usually it occurs when walking up a hill.  She denies any PND, orthopnea, dizziness, palpitations or syncope. She has chronic LLE edema related to prior injury to her foot. She is compliant with her meds and is tolerating meds with no SE.    Past Medical History:  Diagnosis Date   Anemia    Anginal pain (HCC)    Arthritis    Chest discomfort    Disorder of immune system ([place])    Dyspnea     Dyspnea on exertion    Hypercholesterolemia    Hypertension    Migraine    Non-ulcer dyspepsia    Osteopenia of spine    Peptic ulcer disease    Pneumonia    Ulcerative colitis ([place])     Past Surgical History:  Procedure Laterality Date   ANKLE FRACTURE SURGERY Left    COLONOSCOPY     ESOPHAGOGASTRODUODENOSCOPY     hip replacement Bilateral    LEFT HEART CATH AND CORONARY ANGIOGRAPHY N/A 01/21/2022   Procedure: LEFT HEART CATH AND CORONARY ANGIOGRAPHY;  Surgeon: Burnell Blanks, MD;  Location: [facility];  Service: Cardiovascular;  Laterality: N/A;   RECTAL BIOPSY N/A 05/04/2022   Procedure: BIOPSY RECTAL VS EXCISION OF PERIANAL LESION UNDER ANOSCOPY;  Surgeon: Ileana Roup, MD;  Location: [facility];  Service: General;  Laterality: N/A;    Current Medications: Current Meds  Medication Sig   acetaminophen (TYLENOL) 500 MG tablet Take 1,000 mg by mouth every 6 (six) hours as needed for moderate pain.   atorvastatin (LIPITOR) 40 MG tablet Take 1 tablet (40 mg total) by mouth daily.   ezetimibe (ZETIA) 10 MG tablet Take 1 tablet (10 mg total) by mouth daily.   fluticasone (FLONASE) 50 MCG/ACT nasal spray Place 2 sprays into both nostrils daily. (Patient taking differently: Place 2 sprays into both nostrils daily as  needed for allergies.)   Glucosamine HCl (GLUCOSAMINE PO) Take 1 tablet by mouth daily.   inFLIXimab (REMICADE) 100 MG injection Inject into the vein every 8 (eight) weeks.   nitroGLYCERIN (NITROSTAT) 0.4 MG SL tablet Place 1 tablet (0.4 mg total) under the tongue every 5 (five) minutes as needed for chest pain.   PARoxetine (PAXIL) 20 MG tablet Take 10 mg by mouth daily.   triamterene-hydrochlorothiazide (MAXZIDE-25) 37.5-25 MG tablet Take 1 tablet by mouth daily.   TURMERIC PO Take 1,000 mg by mouth daily.     Allergies:   Codeine   Social History   Socioeconomic History   Marital status: Divorced    Spouse name: Not on file   Number of  children: 3   Years of education: Not on file   Highest education level: Not on file  Occupational History   Occupation: retired  Tobacco Use   Smoking status: Never   Smokeless tobacco: Never  Vaping Use   Vaping Use: Never used  Substance and Sexual Activity   Alcohol use: No   Drug use: No   Sexual activity: Yes  Other Topics Concern   Not on file  Social History Narrative   Not on file   Social Determinants of Health   Financial Resource Strain: Not on file  Food Insecurity: Not on file  Transportation Needs: Not on file  Physical Activity: Not on file  Stress: Not on file  Social Connections: Not on file     Family History: The patient's family history includes CAD (age of onset: 13) in her brother; CAD (age of onset: 35) in her mother; Heart attack in her mother; Heart attack (age of onset: 17) in her father; Other in her son; Skin cancer in her brother. There is no history of Colon cancer.  ROS:   Please see the history of present illness.  All other systems reviewed and are negative.  Labs/Other Studies Reviewed:    The following studies were reviewed today:  LHC 01/21/22    Prox RCA lesion is 20% stenosed.   Prox LAD lesion is 20% stenosed.   The left ventricular systolic function is normal.   LV end diastolic pressure is normal.   The left ventricular ejection fraction is 55-65% by visual estimate.   Mild non-obstructive CAD Normal LV systolic function    Recommendations: Medical management of mild CAD. Explore other causes of chest pain.     Lexiscan myoview 08/28/19  Nuclear stress EF: 64%. There was no ST segment deviation noted during stress. The study is normal. This is a low risk study. The left ventricular ejection fraction is normal (55-65%).   Normal pharmacologic nuclear stress test with no evidence for prior infarct or ischemia.  LVEF 64%.  Echo 07/18/19  Left Ventricle: Left ventricular ejection fraction, by visual estimation,  is  60 to 65%. The left ventricle has normal function. No evidence of left  ventricular regional wall motion abnormalities. There is no left  ventricular hypertrophy. Normal left  ventricular size. Spectral Doppler shows Left ventricular diastolic  parameters were normal pattern of LV diastolic filling.  Right Ventricle: The right ventricular size is normal. No increase in  right ventricular wall thickness. Global RV systolic function is has  normal systolic function. The tricuspid regurgitant velocity is 2.40 m/s,  and with an assumed right atrial pressure   of 3 mmHg, the estimated right ventricular systolic pressure is normal at  25.9 mmHg.  Left Atrium: Left atrial size  was normal in size.  Right Atrium: Right atrial size was mildly dilated  Pericardium: Trivial pericardial effusion is present.  Mitral Valve: The mitral valve is normal in structure. No evidence of  mitral valve stenosis by observation. No evidence of mitral valve  regurgitation.  Tricuspid Valve: The tricuspid valve is normal in structure. Tricuspid  valve regurgitation is trivial by color flow Doppler.  Aortic Valve: The aortic valve has an indeterminant number of cusps.  Aortic valve regurgitation was not visualized by color flow Doppler. The  aortic valve is structurally normal, with no evidence of sclerosis or  stenosis.  Pulmonic Valve: The pulmonic valve was not well visualized. Pulmonic valve  regurgitation is not visualized by color flow Doppler.  Aorta: The aortic root, ascending aorta and aortic arch are all  structurally normal, with no evidence of dilitation or obstruction.  Pulmonary Artery: The pulmonary artery is not well seen.  Venous: The inferior vena cava is normal in size with greater than 50%  respiratory variability, suggesting right atrial pressure of 3 mmHg.  IAS/Shunts: No atrial level shunt detected by color flow Doppler. No  ventricular septal defect is seen or detected. There is no evidence  of an  atrial septal defect.   Coronary CTA 08/09/19  1. Coronary calcium score of 190. This was 28 percentile for age and sex matched control.   2. Normal coronary origin with right dominance.   3. CAD-RADS 2. Mild non-obstructive CAD (25-49%) in the proximal portion of LAD, RCA and OM1. Consider non-atherosclerotic causes of chest pain. Consider preventive therapy and risk factor modification.  Recent Labs: 12/12/2021: ALT 16 04/29/2022: BUN 18; Creatinine, Ser 0.91; Hemoglobin 12.9; Platelets 220; Potassium 4.0; Sodium 138  Recent Lipid Panel    Component Value Date/Time   CHOL 131 01/15/2022 0907   TRIG 66 01/15/2022 0907   HDL 65 01/15/2022 0907   CHOLHDL 2.0 01/15/2022 0907   LDLCALC 52 01/15/2022 0907     Risk Assessment/Calculations:       Physical Exam:    VS:  BP (!) 146/78   Pulse 84   Ht 5' 5"  (1.651 m)   Wt 169 lb (76.7 kg)   BMI 28.12 kg/m     Wt Readings from Last 3 Encounters:  08/17/22 169 lb (76.7 kg)  06/24/22 169 lb 9.6 oz (76.9 kg)  04/29/22 167 lb (75.8 kg)    GEN: Well nourished, well developed in no acute distress HEENT: Normal NECK: No JVD; No carotid bruits LYMPHATICS: No lymphadenopathy CARDIAC:RRR, no murmurs, rubs, gallops RESPIRATORY:  Clear to auscultation without rales, wheezing or rhonchi  ABDOMEN: Soft, non-tender, non-distended MUSCULOSKELETAL:  No edema; No deformity  SKIN: Warm and dry NEUROLOGIC:  Alert and oriented x 3 PSYCHIATRIC:  Normal affect   EKG:  EKG is not ordered today.   Diagnoses:    1. Coronary artery disease involving native coronary artery of native heart without angina pectoris   2. Hyperlipidemia LDL goal <70   3. Essential hypertension      Assessment and Plan:     CAD without angina:   -Mild non-obstructive disease by cardiac cath 01/21/22.  -she has chronic non cardiac CP that is stable -No indication for further testing at this time.  -Continue statin  -no ASA due to ulcerative  colitis  Hyperlipidemia LDL goal < 70:  -LDL 52 on 01/15/22. -Atorvastatin was decreased to 46m daily due to aching joints -continue prescription drug management with Zetia 123mdaily and Atorvastatin 401m  daily with PRn refills  -repeat FLP and ALT  Hypertension:  -BP borderline controlled on exam today -her BP at home runs 201-007 systolic at home -continue prescription drug management with Maxide 37.5-25mg daily with PRN refills -check BP twice daily for a week and send them to me   Disposition: 1 year   Medication Adjustments/Labs and Tests Ordered: Current medicines are reviewed at length with the patient today.  Concerns regarding medicines are outlined above.  No orders of the defined types were placed in this encounter.  No orders of the defined types were placed in this encounter.   There are no Patient Instructions on file for this visit.   Signed, Fransico Him, MD  08/17/2022 10:48 AM    [place]

## 2022-08-17 ENCOUNTER — Encounter: Payer: Self-pay | Admitting: Cardiology

## 2022-08-17 ENCOUNTER — Ambulatory Visit: Payer: Medicare Other | Attending: Cardiology | Admitting: Cardiology

## 2022-08-17 VITALS — BP 146/78 | HR 84 | Ht 65.0 in | Wt 169.0 lb

## 2022-08-17 DIAGNOSIS — E785 Hyperlipidemia, unspecified: Secondary | ICD-10-CM | POA: Diagnosis not present

## 2022-08-17 DIAGNOSIS — I2510 Atherosclerotic heart disease of native coronary artery without angina pectoris: Secondary | ICD-10-CM | POA: Diagnosis not present

## 2022-08-17 DIAGNOSIS — I25119 Atherosclerotic heart disease of native coronary artery with unspecified angina pectoris: Secondary | ICD-10-CM

## 2022-08-17 DIAGNOSIS — I10 Essential (primary) hypertension: Secondary | ICD-10-CM

## 2022-08-17 NOTE — Patient Instructions (Signed)
Medication Instructions:  Your physician recommends that you continue on your current medications as directed. Please refer to the Current Medication list given to you today.  *If you need a refill on your cardiac medications before your next appointment, please call your pharmacy*   Lab Work: Fasting Lipid panel and ALP  tomorrow If you have labs (blood work) drawn today and your tests are completely normal, you will receive your results only by: [place] (if you have MyChart) OR A paper copy in the mail If you have any lab test that is abnormal or we need to change your treatment, we will call you to review the results.  Follow-Up: At [facility], you and your health needs are our priority.  As part of our continuing mission to provide you with exceptional heart care, we have created designated Provider Care Teams.  These Care Teams include your primary Cardiologist (physician) and Advanced Practice Providers (APPs -  Physician Assistants and Nurse Practitioners) who all work together to provide you with the care you need, when you need it.  We recommend signing up for the patient portal called "MyChart".  Sign up information is provided on this After Visit Summary.  MyChart is used to connect with patients for Virtual Visits (Telemedicine).  Patients are able to view lab/test results, encounter notes, upcoming appointments, etc.  Non-urgent messages can be sent to your provider as well.   To learn more about what you can do with MyChart, go to [email or web address].    Your next appointment:   1 year(s)  The format for your next appointment:   In Person  Provider:   Fransico Him, MD     Other Instructions Check blood pressure 2 times daily and call results after a week  Important Information About Sugar

## 2022-08-17 NOTE — Addendum Note (Signed)
Addended by: Vergia Alcon A on: 08/17/2022 11:04 AM   Modules accepted: Orders

## 2022-08-17 NOTE — Addendum Note (Signed)
Addended by: Vergia Alcon A on: 08/17/2022 10:57 AM   Modules accepted: Orders

## 2022-08-18 ENCOUNTER — Ambulatory Visit: Payer: Medicare Other | Attending: Cardiology

## 2022-08-18 ENCOUNTER — Telehealth: Payer: Self-pay | Admitting: Gastroenterology

## 2022-08-18 DIAGNOSIS — E785 Hyperlipidemia, unspecified: Secondary | ICD-10-CM

## 2022-08-18 NOTE — Telephone Encounter (Signed)
Returned call to patient. She states that she recently saw Dr. Carlos Levering at [facility] and he recommended discontinuing Remicade infusions and transitioning to [facility]. Pt states that she forgot to call us and she is scheduled for a Remicade infusion tomorrow. Records are in [place] for review. Pt would like to know if she needs to come in for an office visit or can orders be changed per recent recommendations? Please advise, thanks.

## 2022-08-18 NOTE — Telephone Encounter (Signed)
Inbound call from patient requesting a call to discuss Remicade infusion. Please advise.

## 2022-08-18 NOTE — Telephone Encounter (Signed)
Thank you for the note, since I did not know she had seen Dr. Carlos Levering because for some reason I did not receive a note (which is unusual for his office).  I reviewed his note, and also I reviewed the surgical findings of the anal cancer in August.  She should have her infliximab treatment as scheduled on 08/19/2022, and then we will plan to change from that to vedolizumab in the near future.  I will place orders for the vedolizumab with the infusion center, and they will contact her to schedule that after they get her insurance approval.  She should also stop the azathioprine as Dr. Carlos Levering recommended if she has not done so already.  Lastly, please schedule Luretha an appointment to see me in January.  -HD

## 2022-08-19 ENCOUNTER — Encounter: Payer: Self-pay | Admitting: Gastroenterology

## 2022-08-19 ENCOUNTER — Ambulatory Visit (INDEPENDENT_AMBULATORY_CARE_PROVIDER_SITE_OTHER): Payer: Medicare Other

## 2022-08-19 ENCOUNTER — Telehealth: Payer: Self-pay | Admitting: Pharmacy Technician

## 2022-08-19 ENCOUNTER — Other Ambulatory Visit: Payer: Self-pay | Admitting: Pharmacy Technician

## 2022-08-19 VITALS — BP 149/72 | HR 75 | Temp 98.1°F | Resp 18 | Ht 65.0 in | Wt 171.8 lb

## 2022-08-19 DIAGNOSIS — K5130 Ulcerative (chronic) rectosigmoiditis without complications: Secondary | ICD-10-CM

## 2022-08-19 LAB — LIPID PANEL
Chol/HDL Ratio: 1.9 ratio (ref 0.0–4.4)
Cholesterol, Total: 149 mg/dL (ref 100–199)
HDL: 79 mg/dL (ref >39)
LDL Chol Calc (NIH): 55 mg/dL (ref 0–99)
Triglycerides: 77 mg/dL (ref 0–149)
VLDL Cholesterol Cal: 15 mg/dL (ref 5–40)

## 2022-08-19 LAB — ALT: ALT: 19 IU/L (ref 0–32)

## 2022-08-19 MED ORDER — SODIUM CHLORIDE 0.9 % IV SOLN
5.0000 mg/kg | Freq: Once | INTRAVENOUS | Status: AC
  Administered 2022-08-19: 400 mg via INTRAVENOUS
  Filled 2022-08-19: qty 40

## 2022-08-19 MED ORDER — METHYLPREDNISOLONE SODIUM SUCC 40 MG IJ SOLR
40.0000 mg | Freq: Once | INTRAMUSCULAR | Status: AC
  Administered 2022-08-19: 40 mg via INTRAVENOUS
  Filled 2022-08-19: qty 1

## 2022-08-19 MED ORDER — DIPHENHYDRAMINE HCL 25 MG PO CAPS
25.0000 mg | ORAL_CAPSULE | Freq: Once | ORAL | Status: AC
  Administered 2022-08-19: 25 mg via ORAL
  Filled 2022-08-19: qty 1

## 2022-08-19 MED ORDER — ACETAMINOPHEN 325 MG PO TABS
650.0000 mg | ORAL_TABLET | Freq: Once | ORAL | Status: AC
  Administered 2022-08-19: 650 mg via ORAL
  Filled 2022-08-19: qty 2

## 2022-08-19 NOTE — Progress Notes (Signed)
Diagnosis: Crohn's Disease  Provider:  Marshell Garfinkel MD  Procedure: Infusion  IV Type: Peripheral, IV Location: L Hand  Remicade (Infliximab), Dose: 400 mg  Infusion Start Time: 0746  Infusion Stop Time: 1030  Post Infusion IV Care: Peripheral IV Discontinued  Discharge: Condition: Good, Destination: Home . AVS provided to patient.   Performed by:  Adelina Mings, LPN

## 2022-08-19 NOTE — Telephone Encounter (Signed)
Called and left patient a detailed vm with the information below. I asked that patient call back to schedule an appt with Dr. Loletha Carrow in January.

## 2022-08-19 NOTE — Telephone Encounter (Signed)
Thank you for the update.  I did not cancel the infliximab yesterday because I did not want to interrupt her orders for today.  Also, I am uncertain on the infusion navigator tab how to discontinue treatment order like that.  I would greatly appreciate it if you would do that on my behalf and send the order to me for cosignature.  I did place the new order for [facility] yesterday with a start date approximately 2 weeks from now, thereby giving you time to do the prior authorization. Please plan for the first Entyvio infusion by the end of December ([place], anytime in the next 2 to 4 weeks).  - HD

## 2022-08-19 NOTE — Telephone Encounter (Addendum)
Auth Submission: NO AUTH NEEDED -PATIENT TRACKING Payer: MEDICARE A/B & BCBS SUPP Medication & CPT/J Code(s) submitted: Entyvio (Vedolizumab) [id number] Route of submission (phone, fax, portal):  Phone # Fax # Auth type: Buy/Bill Units/visits requested: 8 DOSES Reference number:  Approval from: 08/19/30 to 09/20/22   Medicare coverage reviewed and auth approval extended to 08/21/23

## 2022-08-19 NOTE — Telephone Encounter (Signed)
Pt returned call. She received the vm and verbalized understanding of the information. We scheduled her for a follow up appt with Dr. Loletha Carrow on Thursday, 10/01/22 at 8:20 am. Pt had no concerns at the end of the call.

## 2022-09-01 ENCOUNTER — Ambulatory Visit (INDEPENDENT_AMBULATORY_CARE_PROVIDER_SITE_OTHER): Payer: Medicare Other

## 2022-09-01 VITALS — BP 158/82 | HR 85 | Temp 97.9°F | Resp 18 | Ht 65.0 in | Wt 174.4 lb

## 2022-09-01 DIAGNOSIS — K5120 Ulcerative (chronic) proctitis without complications: Secondary | ICD-10-CM

## 2022-09-01 MED ORDER — VEDOLIZUMAB 300 MG IV SOLR
300.0000 mg | Freq: Once | INTRAVENOUS | Status: AC
  Administered 2022-09-01: 300 mg via INTRAVENOUS
  Filled 2022-09-01: qty 5

## 2022-09-01 NOTE — Progress Notes (Signed)
Diagnosis: Ulcerative Colitis  Provider:  Marshell Garfinkel MD  Procedure: Infusion  IV Type: Peripheral, IV Location: L Hand  Entyvio (Vedolizumab), Dose: 300 mg  Infusion Start Time: 1924  Infusion Stop Time: 1023  Post Infusion IV Care: Observation period completed and Peripheral IV Discontinued  Discharge: Condition: Good, Destination: Home . AVS provided to patient.   Performed by:  Arnoldo Morale, RN

## 2022-09-03 ENCOUNTER — Encounter: Payer: Self-pay | Admitting: Gastroenterology

## 2022-09-05 ENCOUNTER — Encounter: Payer: Self-pay | Admitting: Gastroenterology

## 2022-09-15 ENCOUNTER — Ambulatory Visit (INDEPENDENT_AMBULATORY_CARE_PROVIDER_SITE_OTHER): Payer: Medicare Other

## 2022-09-15 VITALS — BP 173/94 | HR 80 | Temp 98.0°F | Resp 18 | Ht 65.0 in | Wt 174.8 lb

## 2022-09-15 DIAGNOSIS — K5120 Ulcerative (chronic) proctitis without complications: Secondary | ICD-10-CM

## 2022-09-15 MED ORDER — VEDOLIZUMAB 300 MG IV SOLR
300.0000 mg | Freq: Once | INTRAVENOUS | Status: AC
  Administered 2022-09-15: 300 mg via INTRAVENOUS
  Filled 2022-09-15: qty 5

## 2022-09-15 NOTE — Progress Notes (Signed)
Diagnosis: Ulcerative Colitis  Provider:  Marshell Garfinkel MD  Procedure: Infusion  IV Type: Peripheral, IV Location: R Antecubital  Entyvio (Vedolizumab), Dose: 300 mg  Infusion Start Time: 0910  Infusion Stop Time: 7519  Post Infusion IV Care: Peripheral IV Discontinued  Discharge: Condition: Good, Destination: Home . AVS provided to patient.   Performed by:  Arnoldo Morale, RN

## 2022-09-18 ENCOUNTER — Telehealth: Payer: Self-pay | Admitting: *Deleted

## 2022-09-18 NOTE — Telephone Encounter (Signed)
   Patient Name: Andrea [name]  DOB: December 14, 1955 MRN: [id number]  Primary Cardiologist: Fransico Him, MD  Chart reviewed as part of pre-operative protocol coverage.  Spoke with patient regarding surgical clearance request.  Patient states she is no longer having surgery with Dr. Wynelle Link of [facility].  She plans to have surgery with Dr. Wyline Copas of [facility] at [facility].  Will await updated clearance request.  I will remove this request from pre-op pool.  Please call with questions.  Lenna Sciara, NP 09/18/2022, 1:43 PM

## 2022-09-18 NOTE — Telephone Encounter (Signed)
   Pre-operative Risk Assessment    Patient Name: Andrea [name]  DOB: 1956-03-25 MRN: [id number]      Request for Surgical Clearance    Procedure:   LEFT TOTAL KNEE ARTHROPLASTY  Date of Surgery:  Clearance 11/17/22                                 Surgeon:  DR. Gaynelle Arabian Surgeon's Group or Practice Name:  [facility] Phone number:  [phone]; [name] Fax number:  [phone]   Type of Clearance Requested:   - Medical ; NO MEDICATIONS ARE LISTED AS NEEDING TO BE HELD   Type of Anesthesia:   CHOICE   Additional requests/questions:    Andrea [name]   09/18/2022, 1:00 PM

## 2022-09-28 ENCOUNTER — Encounter: Payer: Self-pay | Admitting: Gastroenterology

## 2022-09-30 ENCOUNTER — Encounter: Payer: Self-pay | Admitting: Orthopaedic Surgery

## 2022-09-30 ENCOUNTER — Ambulatory Visit (INDEPENDENT_AMBULATORY_CARE_PROVIDER_SITE_OTHER): Payer: Medicare Other | Admitting: Orthopaedic Surgery

## 2022-09-30 ENCOUNTER — Other Ambulatory Visit: Payer: Self-pay

## 2022-09-30 ENCOUNTER — Encounter: Payer: Self-pay | Admitting: Gastroenterology

## 2022-09-30 VITALS — Ht 65.0 in | Wt 170.0 lb

## 2022-09-30 DIAGNOSIS — M1712 Unilateral primary osteoarthritis, left knee: Secondary | ICD-10-CM | POA: Diagnosis not present

## 2022-09-30 NOTE — Progress Notes (Signed)
Office Visit Note   Patient: Andrea [name]           Date of Birth: November 28, 1955           MRN: [id number] Visit Date: 09/30/2022              Requested by: Kristen Loader, [address],  [place] [address] PCP: Kristen Loader, FNP   Assessment & Plan: Visit Diagnoses:  1. Primary osteoarthritis of left knee     Plan: Impression is severe left knee degenerative joint disease secondary to Osteoarthritis.  Bone on bone joint space narrowing is seen on radiographs with mild varus alignment.  At this point, conservative treatments fail to provide any significant relief and the pain is severely affecting ADLs and quality of life.  Based on treatment options, the patient has elected to move forward with a knee replacement.  We have discussed the surgical risks that include but are not limited to infection, DVT, leg length discrepancy, stiffness, numbness, tingling, incomplete relief of pain.  Recovery and prognosis were also reviewed.    Patient is on Entyvio for ulcerative colitis and last infusion was on 09/01/2022.  I have instructed her to remain off of this until her surgery later this month and she can resume when it has been at least 2 weeks after the surgery.  Anticoagulants: No antithrombotic Postop anticoagulation: Xarelto x 4 weeks; can't take aspirin due to UC Diabetic: No  Nickel allergy: No Prior DVT/PE: No Tobacco use: No Clearances needed for surgery: None Anticipated discharge dispo: Home   Follow-Up Instructions: No follow-ups on file.   Orders:  No orders of the defined types were placed in this encounter.  No orders of the defined types were placed in this encounter.     Procedures: No procedures performed   Clinical Data: No additional findings.   Subjective: Chief Complaint  Patient presents with   Left Knee - Pain    HPI Andrea [name] is a pleasant 67 year old female here for evaluation of longstanding left knee pain.  She has pain that  is throughout the knee.  She has had several cortisone injections with the most recent one in September which was ineffective.  She has tried gel injections as well without any relief.  Currently takes Tylenol and tramadol.  Pain interferes with quality life and daily activities. Review of Systems  Constitutional: Negative.   HENT: Negative.    Eyes: Negative.   Respiratory: Negative.    Cardiovascular: Negative.   Endocrine: Negative.   Musculoskeletal: Negative.   Neurological: Negative.   Hematological: Negative.   Psychiatric/Behavioral: Negative.    All other systems reviewed and are negative.    Objective: Vital Signs: Ht '5\' 5"'$  (1.651 m)   Wt 170 lb (77.1 kg)   BMI 28.29 kg/m   Physical Exam Vitals and nursing note reviewed.  Constitutional:      Appearance: She is well-developed.  HENT:     Head: Atraumatic.     Nose: Nose normal.  Eyes:     Extraocular Movements: Extraocular movements intact.  Cardiovascular:     Pulses: Normal pulses.  Pulmonary:     Effort: Pulmonary effort is normal.  Abdominal:     Palpations: Abdomen is soft.  Musculoskeletal:     Cervical back: Neck supple.  Skin:    General: Skin is warm.     Capillary Refill: Capillary refill takes less than 2 seconds.  Neurological:     Mental  Status: She is alert. Mental status is at baseline.  Psychiatric:        Behavior: Behavior normal.        Thought Content: Thought content normal.        Judgment: Judgment normal.     Ortho Exam Examination of the left knee shows antalgic gait.  Walks with a single-point cane.  Pain and crepitus throughout range of motion.  Trace effusion.  Medial and lateral joint line tenderness. Specialty Comments:  No specialty comments available.  Imaging: No results found.   PMFS History: Patient Active Problem List   Diagnosis Date Noted   Primary osteoarthritis of left knee 09/30/2022   Chest pain of uncertain etiology    Ulcerative colitis ([place])  07/06/2019   PUD (peptic ulcer disease) 07/06/2019   Past Medical History:  Diagnosis Date   Anemia    Anginal pain (HCC)    Arthritis    Chest discomfort    Disorder of immune system ([place])    Dyspnea    Dyspnea on exertion    Hypercholesterolemia    Hypertension    Migraine    Non-ulcer dyspepsia    Osteopenia of spine    Peptic ulcer disease    Pneumonia    Ulcerative colitis ([place])     Family History  Problem Relation Age of Onset   CAD Mother 75   Heart attack Mother    Heart attack Father 50   CAD Brother 40   Skin cancer Brother    Other Son        gluten sensitivity   Colon cancer Neg Hx     Past Surgical History:  Procedure Laterality Date   ANKLE FRACTURE SURGERY Left    COLONOSCOPY     ESOPHAGOGASTRODUODENOSCOPY     hip replacement Bilateral    LEFT HEART CATH AND CORONARY ANGIOGRAPHY N/A 01/21/2022   Procedure: LEFT HEART CATH AND CORONARY ANGIOGRAPHY;  Surgeon: Burnell Blanks, MD;  Location: [facility];  Service: Cardiovascular;  Laterality: N/A;   RECTAL BIOPSY N/A 05/04/2022   Procedure: BIOPSY RECTAL VS EXCISION OF PERIANAL LESION UNDER ANOSCOPY;  Surgeon: Ileana Roup, MD;  Location: [facility];  Service: General;  Laterality: N/A;   Social History   Occupational History   Occupation: retired  Tobacco Use   Smoking status: Never   Smokeless tobacco: Never  Vaping Use   Vaping Use: Never used  Substance and Sexual Activity   Alcohol use: No   Drug use: No   Sexual activity: Yes

## 2022-10-01 ENCOUNTER — Ambulatory Visit (INDEPENDENT_AMBULATORY_CARE_PROVIDER_SITE_OTHER): Payer: Medicare Other | Admitting: Gastroenterology

## 2022-10-01 ENCOUNTER — Encounter: Payer: Self-pay | Admitting: Gastroenterology

## 2022-10-01 ENCOUNTER — Telehealth: Payer: Self-pay | Admitting: Orthopaedic Surgery

## 2022-10-01 VITALS — BP 144/72 | HR 85 | Ht 65.0 in | Wt 169.0 lb

## 2022-10-01 DIAGNOSIS — Z85048 Personal history of other malignant neoplasm of rectum, rectosigmoid junction, and anus: Secondary | ICD-10-CM

## 2022-10-01 DIAGNOSIS — K5100 Ulcerative (chronic) pancolitis without complications: Secondary | ICD-10-CM | POA: Diagnosis not present

## 2022-10-01 NOTE — Telephone Encounter (Signed)
Patient is scheduled for left total knee arthroplasty 10-19-22 at [facility] with Dr. Erlinda Hong. Patient has requested Dr. Erlinda Hong take a look at the notes provided yesterday from Dr. Loletha Carrow with [facility]. He would like for the patient to proceed with her next infusion on 10-14-22 so she can continue keeping her colitis under control.  He feels she can stay on the Entyvio infusion and still have her knee replacement because the Entyvio mechanism of action is gut-specific and it not expected to her increase her perioperative infection risk.

## 2022-10-01 NOTE — Telephone Encounter (Signed)
Thank you for letting me know.  We can proceed with the infusion and the surgery is scheduled given this information.

## 2022-10-01 NOTE — Patient Instructions (Signed)
_______________________________________________________  If you are age 68 or older, your body mass index should be between 23-30. Your Body mass index is 28.12 kg/m. If this is out of the aforementioned range listed, please consider follow up with your Primary Care Provider.  If you are age 77 or younger, your body mass index should be between 19-25. Your Body mass index is 28.12 kg/m. If this is out of the aformentioned range listed, please consider follow up with your Primary Care Provider.   ________________________________________________________  The [facility] providers would like to encourage you to use [facility] to communicate with providers for non-urgent requests or questions.  Due to long hold times on the telephone, sending your provider a message by [facility] may be a faster and more efficient way to get a response.  Please allow 48 business hours for a response.  Please remember that this is for non-urgent requests.  _______________________________________________________  Follow in iin 3-4 months  It was a pleasure to see you today!  Thank you for trusting me with your gastrointestinal care!

## 2022-10-01 NOTE — Progress Notes (Signed)
[facility] Progress Note  Chief Complaint: Ulcerative pancolitis  Subjective  History: Summary of GI problem: Ulcerative pancolitis initially diagnosed 2005 and treated by Dr. Marnee Spring at [facility], transferred care to me after moving to this area.  Was stable for years on infliximab and azathioprine. [facility] visit spring 2023 for anal symptoms, abnormal tissue seen and referred to colorectal surgery, suspecting AIN.  EUA discovered early stage anal squamous carcinoma on a background of AIN 3 , resected for cure.  Saw Dr. Dema Severin at [facility] and postop follow-up November 2023. She return for consultation with Dr. Carlos Levering, who recommended changing her regimen because of the malignancy diagnosis, so she was started on [facility] 09/01/2022. Next infusion is scheduled 10/14/2025.   Humna is glad to report that her colitis still feels under complete control, and she has no abdominal pain, diarrhea or rectal bleeding.  She has a family amount of residual itching after the anal surgery but no fecal incontinence. She has had worsening knee osteoarthritis pain and sought consultation from Dr. Elmyra Ricks for knee replacement.  I received a request for clearance related to concerns about the [facility] management.  Siearra tells me today that she changed to a different orthopedist Erlinda Hong) who is planning to do her knee replacement 10/19/2022. No prior history of DVT ROS: Cardiovascular:  no chest pain Respiratory: no dyspnea Left knee pain as noted above The patient's Past Medical, Family and Social History were reviewed and are on file in the EMR.  Objective:  Med list reviewed  Current Outpatient Medications:    acetaminophen (TYLENOL) 500 MG tablet, Take 1,000 mg by mouth every 6 (six) hours as needed for moderate pain., Disp: , Rfl:    atorvastatin (LIPITOR) 40 MG tablet, Take 1 tablet (40 mg total) by mouth daily., Disp: 90 tablet, Rfl: 3   ezetimibe (ZETIA) 10 MG tablet, Take 1 tablet (10 mg total)  by mouth daily., Disp: 90 tablet, Rfl: 3   fluticasone (FLONASE) 50 MCG/ACT nasal spray, Place 2 sprays into both nostrils daily. (Patient taking differently: Place 2 sprays into both nostrils daily as needed for allergies.), Disp: 1 g, Rfl: 0   PARoxetine (PAXIL) 20 MG tablet, Take 10 mg by mouth daily., Disp: , Rfl:    triamterene-hydrochlorothiazide (MAXZIDE-25) 37.5-25 MG tablet, Take 1 tablet by mouth daily., Disp: , Rfl:    nitroGLYCERIN (NITROSTAT) 0.4 MG SL tablet, Place 1 tablet (0.4 mg total) under the tongue every 5 (five) minutes as needed for chest pain., Disp: 25 tablet, Rfl: 2   Vital signs in last 24 hrs: Vitals:   10/01/22 0821  BP: (!) 144/72  Pulse: 85  SpO2: 99%   Wt Readings from Last 3 Encounters:  10/01/22 169 lb (76.7 kg)  09/30/22 170 lb (77.1 kg)  09/15/22 174 lb 12.8 oz (79.3 kg)    Physical Exam  She is well-appearing HEENT: sclera anicteric, oral mucosa moist without lesions Cardiac: Regular without appreciable murmur,  no peripheral edema Pulm: clear to auscultation bilaterally, normal RR and effort noted Abdomen: soft, no tenderness, with active bowel sounds. No guarding or palpable hepatosplenomegaly. Antalgic gait  Labs:  Colorectal surgery operative reports and pathology notes reviewed.  Dr. Ephriam Knuckles most recent office note was reviewed. ___________________________________________ Radiologic studies:   ____________________________________________ Other:   _____________________________________________ Assessment & Plan  Assessment: Encounter Diagnoses  Name Primary?   Ulcerative pancolitis without complication ([place]) Yes   History of anal cancer    Long-term remission of ulcerative  pancolitis, recently changed from combination azathioprine/infliximab to [facility] due to concerns of having developed anal cancer and skin cancer last year.  Her upcoming infusion will be the third and final induction dose, and it is essential that she  remains on schedule for that to keep her colitis under control.  The next infusion after that would be 8 weeks later.  As she remained on infliximab, there would be the potential for increased perioperative infection risk, and typically we would recommend delaying elective surgery several weeks after a dose of that.  However, Entyvio's mechanism of action is gut-specific and is not expected to increase her perioperative infection risk. Therefore, I feel she can stay on schedule for both the upcoming Entyvio infusion and knee replacement.  Usual postoperative DVT prophylaxis practice should also be followed.  Gloris will come to see me in about 3 months for follow-up on her colitis after this change in treatment, and call sooner if needed.  30 minutes were spent on this encounter (including chart review, history/exam, counseling/coordination of care, and documentation) > 50% of that time was spent on counseling and coordination of care.  (Extensive record review required for interval development since last visit here) Nelida Meuse III

## 2022-10-12 ENCOUNTER — Other Ambulatory Visit: Payer: Self-pay | Admitting: Physician Assistant

## 2022-10-12 MED ORDER — DOCUSATE SODIUM 100 MG PO CAPS: 100.0000 mg | ORAL_CAPSULE | Freq: Every day | ORAL | 2 refills | Status: DC | PRN

## 2022-10-12 MED ORDER — METHOCARBAMOL 750 MG PO TABS: 750.0000 mg | ORAL_TABLET | Freq: Two times a day (BID) | ORAL | 2 refills | Status: DC | PRN

## 2022-10-12 MED ORDER — ENOXAPARIN SODIUM 30 MG/0.3ML IJ SOSY: 30.0000 mg | PREFILLED_SYRINGE | Freq: Two times a day (BID) | INTRAMUSCULAR | 0 refills | Status: DC

## 2022-10-12 MED ORDER — OXYCODONE-ACETAMINOPHEN 5-325 MG PO TABS: 1.0000 | ORAL_TABLET | Freq: Four times a day (QID) | ORAL | 0 refills | Status: DC | PRN

## 2022-10-12 MED ORDER — ONDANSETRON HCL 4 MG PO TABS: 4.0000 mg | ORAL_TABLET | Freq: Three times a day (TID) | ORAL | 0 refills | Status: DC | PRN

## 2022-10-13 NOTE — Progress Notes (Signed)
Surgical Instructions    Your procedure is scheduled on Monday, 10/19/22.  Report to [facility] Main Entrance "A" at 5:30 A.M., then check in with the Admitting office.  Call this number if you have problems the morning of surgery:  [phone]   If you have any questions prior to your surgery date call [phone]: Open Monday-Friday 8am-4pm If you experience any cold or flu symptoms such as cough, fever, chills, shortness of breath, etc. between now and your scheduled surgery, please notify us at the above number     Remember:  Do not eat after midnight the night before your surgery  You may drink clear liquids until 4:15am the morning of your surgery.   Clear liquids allowed are: Water, Non-Citrus Juices (without pulp), Carbonated Beverages, Clear Tea, Black Coffee ONLY (NO MILK, CREAM OR POWDERED CREAMER of any kind), and Gatorade  Patient Instructions  The night before surgery:  No food after midnight. ONLY clear liquids after midnight  The day of surgery (if you do NOT have diabetes):  Drink ONE (1) Pre-Surgery Clear Ensure by 4:15am the morning of surgery. Drink in one sitting. Do not sip.  This drink was given to you during your hospital  pre-op appointment visit. Nothing else to drink after completing the  Pre-Surgery Clear Ensure.          If you have questions, please contact your surgeon's office.     Take these medicines the morning of surgery with A SIP OF WATER:  ezetimibe (ZETIA)  PARoxetine (PAXIL)   IF NEEDED: acetaminophen (TYLENOL)  fluticasone (FLONASE)  methocarbamol (ROBAXIN-750)  ondansetron (ZOFRAN)  traMADol (ULTRAM)   As of today, STOP taking any Aspirin (unless otherwise instructed by your surgeon) Aleve, Naproxen, Ibuprofen, Motrin, Advil, Goody's, BC's, all herbal medications, fish oil, and all vitamins.           Do not wear jewelry or makeup. Do not wear lotions, powders, perfumes or deodorant. Do not shave 48 hours prior to surgery.    Do not bring valuables to the hospital. Do not wear nail polish, gel polish, artificial nails, or any other type of covering on natural nails (fingers and toes) If you have artificial nails or gel coating that need to be removed by a nail salon, please have this removed prior to surgery. Artificial nails or gel coating may interfere with anesthesia's ability to adequately monitor your vital signs.  [facility] is not responsible for any belongings or valuables.    Do NOT Smoke (Tobacco/Vaping)  24 hours prior to your procedure  If you use a CPAP at night, you may bring your mask for your overnight stay.   Contacts, glasses, hearing aids, dentures or partials may not be worn into surgery, please bring cases for these belongings   For patients admitted to the hospital, discharge time will be determined by your treatment team.   Patients discharged the day of surgery will not be allowed to drive home, and someone needs to stay with them for 24 hours.   SURGICAL WAITING ROOM VISITATION Patients having surgery or a procedure may have no more than 2 support people in the waiting area - these visitors may rotate.   Children under the age of 69 must have an adult with them who is not the patient. If the patient needs to stay at the hospital during part of their recovery, the visitor guidelines for inpatient rooms apply. Pre-op nurse will coordinate an appropriate time for 1 support person to  accompany patient in pre-op.  This support person may not rotate.   Please refer to [email or web address] for the visitor guidelines for Inpatients (after your surgery is over and you are in a regular room).    Special instructions:    Oral Hygiene is also important to reduce your risk of infection.  Remember - BRUSH YOUR TEETH THE MORNING OF SURGERY WITH YOUR REGULAR TOOTHPASTE   [place]- Preparing For Surgery  Before surgery, you can play an  important role. Because skin is not sterile, your skin needs to be as free of germs as possible. You can reduce the number of germs on your skin by washing with CHG (chlorahexidine gluconate) Soap before surgery.  CHG is an antiseptic cleaner which kills germs and bonds with the skin to continue killing germs even after washing.     Please do not use if you have an allergy to CHG or antibacterial soaps. If your skin becomes reddened/irritated stop using the CHG.  Do not shave (including legs and underarms) for at least 48 hours prior to first CHG shower. It is OK to shave your face.  Please follow these instructions carefully.     Shower the NIGHT BEFORE SURGERY and the MORNING OF SURGERY with CHG Soap.   If you chose to wash your hair, wash your hair first as usual with your normal shampoo. After you shampoo, rinse your hair and body thoroughly to remove the shampoo.  Then [employer] and genitals (private parts) with your normal soap and rinse thoroughly to remove soap.  After that Use CHG Soap as you would any other liquid soap. You can apply CHG directly to the skin and wash gently with a scrungie or a clean washcloth.   Apply the CHG Soap to your body ONLY FROM THE NECK DOWN.  Do not use on open wounds or open sores. Avoid contact with your eyes, ears, mouth and genitals (private parts). Wash Face and genitals (private parts)  with your normal soap.   Wash thoroughly, paying special attention to the area where your surgery will be performed.  Thoroughly rinse your body with warm water from the neck down.  DO NOT shower/wash with your normal soap after using and rinsing off the CHG Soap.  Pat yourself dry with a CLEAN TOWEL.  Wear CLEAN PAJAMAS to bed the night before surgery  Place CLEAN SHEETS on your bed the night before your surgery  DO NOT SLEEP WITH PETS.   Day of Surgery: Take a shower with CHG soap. Wear Clean/Comfortable clothing the morning of surgery Do not apply any  deodorants/lotions.   Remember to brush your teeth WITH YOUR REGULAR TOOTHPASTE.    If you received a COVID test during your pre-op visit, it is requested that you wear a mask when out in public, stay away from anyone that may not be feeling well, and notify your surgeon if you develop symptoms. If you have been in contact with anyone that has tested positive in the last 10 days, please notify your surgeon.    Please read over the following fact sheets that you were given.

## 2022-10-14 ENCOUNTER — Ambulatory Visit (INDEPENDENT_AMBULATORY_CARE_PROVIDER_SITE_OTHER): Payer: Medicare Other

## 2022-10-14 ENCOUNTER — Other Ambulatory Visit: Payer: Self-pay | Admitting: Physician Assistant

## 2022-10-14 ENCOUNTER — Encounter (HOSPITAL_COMMUNITY)
Admission: RE | Admit: 2022-10-14 | Discharge: 2022-10-14 | Disposition: A | Discharge status: Home / Self Care | Payer: Medicare Other | Source: Ambulatory Visit | Attending: Orthopaedic Surgery | Admitting: Orthopaedic Surgery

## 2022-10-14 ENCOUNTER — Encounter (HOSPITAL_COMMUNITY): Payer: Self-pay

## 2022-10-14 ENCOUNTER — Other Ambulatory Visit: Payer: Self-pay

## 2022-10-14 ENCOUNTER — Encounter: Payer: Self-pay | Admitting: Orthopaedic Surgery

## 2022-10-14 VITALS — BP 124/71 | HR 82 | Temp 97.5°F | Resp 16 | Ht 65.0 in | Wt 168.6 lb

## 2022-10-14 VITALS — BP 128/64 | HR 87 | Temp 98.0°F | Resp 17 | Ht 65.0 in | Wt 168.0 lb

## 2022-10-14 DIAGNOSIS — Z01812 Encounter for preprocedural laboratory examination: Secondary | ICD-10-CM | POA: Diagnosis not present

## 2022-10-14 DIAGNOSIS — K5120 Ulcerative (chronic) proctitis without complications: Secondary | ICD-10-CM | POA: Diagnosis not present

## 2022-10-14 DIAGNOSIS — M1712 Unilateral primary osteoarthritis, left knee: Secondary | ICD-10-CM | POA: Diagnosis not present

## 2022-10-14 DIAGNOSIS — Z01818 Encounter for other preprocedural examination: Secondary | ICD-10-CM

## 2022-10-14 DIAGNOSIS — Z79899 Other long term (current) drug therapy: Secondary | ICD-10-CM | POA: Diagnosis not present

## 2022-10-14 HISTORY — DX: Prediabetes: R73.03

## 2022-10-14 HISTORY — DX: Rectal polyp: K62.1

## 2022-10-14 HISTORY — DX: Unspecified malignant neoplasm of skin, unspecified: C44.90

## 2022-10-14 LAB — BASIC METABOLIC PANEL
Anion gap: 10 (ref 5–15)
BUN: 19 mg/dL (ref 8–23)
CO2: 24 mmol/L (ref 22–32)
Calcium: 9.6 mg/dL (ref 8.9–10.3)
Chloride: 105 mmol/L (ref 98–111)
Creatinine, Ser: 0.9 mg/dL (ref 0.44–1.00)
GFR, Estimated: >60 mL/min (ref >60)
Glucose, Bld: 103 mg/dL — ABNORMAL HIGH (ref 70–99)
Potassium: 3.9 mmol/L (ref 3.5–5.1)
Sodium: 139 mmol/L (ref 135–145)

## 2022-10-14 LAB — CBC
HCT: 35.9 % — ABNORMAL LOW (ref 36.0–46.0)
Hemoglobin: 11.7 g/dL — ABNORMAL LOW (ref 12.0–15.0)
MCH: 29.5 pg (ref 26.0–34.0)
MCHC: 32.6 g/dL (ref 30.0–36.0)
MCV: 90.7 fL (ref 80.0–100.0)
Platelets: 177 10*3/uL (ref 150–400)
RBC: 3.96 MIL/uL (ref 3.87–5.11)
RDW: 12.2 % (ref 11.5–15.5)
WBC: 6.5 10*3/uL (ref 4.0–10.5)
nRBC: 0 % (ref 0.0–0.2)

## 2022-10-14 LAB — SURGICAL PCR SCREEN
MRSA, PCR: NEGATIVE
Staphylococcus aureus: NEGATIVE

## 2022-10-14 MED ORDER — APIXABAN 2.5 MG PO TABS: ORAL_TABLET | ORAL | 0 refills | Status: DC

## 2022-10-14 MED ORDER — ENOXAPARIN SODIUM 30 MG/0.3ML IJ SOSY: 30.0000 mg | PREFILLED_SYRINGE | Freq: Two times a day (BID) | INTRAMUSCULAR | 0 refills | Status: DC

## 2022-10-14 MED ORDER — VEDOLIZUMAB 300 MG IV SOLR
300.0000 mg | Freq: Once | INTRAVENOUS | Status: AC
  Administered 2022-10-14: 300 mg via INTRAVENOUS
  Filled 2022-10-14: qty 5

## 2022-10-14 NOTE — Telephone Encounter (Signed)
I have sent in lovenox to [facility] in addition to eliquis which would be $302 it looks like.  She can choose one or the other.  Xarelto was going to be $500-600 so I did not send in.  Can you ask what suppository has worked best for her in the past?  Can you also let her know that we can try a dissolvable ant nausea medicine that you put under tongue that she may prefer to suppository

## 2022-10-14 NOTE — Progress Notes (Signed)
Diagnosis: Crohn's Disease  Provider:  Marshell Garfinkel MD  Procedure: Infusion  IV Type: Peripheral, IV Location: R Antecubital  Entyvio (Vedolizumab), Dose: 300 mg  Infusion Start Time: 2548  Infusion Stop Time: 1024  Post Infusion IV Care: Peripheral IV Discontinued  Discharge: Condition: Good, Destination: Home . AVS provided to patient.   Performed by:  Adelina Mings, LPN

## 2022-10-14 NOTE — Progress Notes (Signed)
PCP: Jillyn Ledger, FNP @ [facility] Cardiologist: Dr. Berline Lopes  EKG: 01/12/22 CXR: n/a ECHO: 07/18/2019 Stress Test: 08/28/2019 Cardiac Cath: 01/21/22  ERAS: Ensure drink provided   Patient denies shortness of breath, fever, cough, and chest pain at PAT appointment.  Patient verbalized understanding of instructions provided today at the PAT appointment.  Patient asked to review instructions at home and day of surgery.

## 2022-10-15 ENCOUNTER — Other Ambulatory Visit: Payer: Self-pay | Admitting: Physician Assistant

## 2022-10-15 MED ORDER — ONDANSETRON 4 MG PO TBDP: 4.0000 mg | ORAL_TABLET | Freq: Three times a day (TID) | ORAL | 2 refills | Status: DC | PRN

## 2022-10-15 NOTE — Telephone Encounter (Signed)
I sent in zofran odt.  As far as toilet seat, I believe hospital will supply upon discharge

## 2022-10-15 NOTE — Progress Notes (Signed)
Anesthesia Chart Review:  Follows with cardiology for history of HTN, HLD, noncardiac chest pain.  She was initially referred to cardiology by PCP for evaluation of chest pain and seen on 07/06/2019.  Coronary CTA revealed calcium score of 190, 89th percentile for age and sex matched control, mild nonobstructive CAD in the proximal portion of LAD, RCA, and OM1.  Lexiscan Myoview was ordered to rule out ischemia and it revealed no evidence of prior infarct or ischemia.  Echo 07/18/2019 revealed LVEF 60 to 65%, no LVH, normal RV, normal valve function. At office visit on 01/12/22, she reported increasing episodes of midsternal chest tightness that occur when she is walking accompanied by shortness of breath. Left heart cath on 01/21/22 revealed 20% stenosis proximal RCA and proximal LAD vessels, normal LVEDP, LVEF 55-65%.  Last seen by Dr. Radford Pax 08/17/2022 and noted to be doing well.  She reports that she still occasionally has some chest tightness with associated SOB the last 3 to 4 minutes and resolves.  Per note, "1. CAD without angina:  -Mild non-obstructive disease by cardiac cath 01/21/22.  -she has chronic non cardiac CP that is stable -No indication for further testing at this time.  -Continue statin  -no ASA due to ulcerative colitis."  1 year follow-up recommended.  Preop labs reviewed, mild anemia with hemoglobin 11.7, otherwise unremarkable.  EKG 01/12/2022: NSR.  Rate 75.  LHC 01/21/22   Prox RCA lesion is 20% stenosed.   Prox LAD lesion is 20% stenosed.   The left ventricular systolic function is normal.   LV end diastolic pressure is normal.   The left ventricular ejection fraction is 55-65% by visual estimate.   Mild non-obstructive CAD Normal LV systolic function    Recommendations: Medical management of mild CAD. Explore other causes of chest pain.    Lexiscan myoview 08/28/19   Nuclear stress EF: 64%. There was no ST segment deviation noted during stress. The study is normal. This  is a low risk study. The left ventricular ejection fraction is normal (55-65%).   Normal pharmacologic nuclear stress test with no evidence for prior infarct or ischemia.  LVEF 64%.   Echo 07/18/19  1. Left ventricular ejection fraction, by visual estimation, is 60 to  65%. The left ventricle has normal function. Normal left ventricular size.  There is no left ventricular hypertrophy.   2. Global right ventricle has normal systolic function.The right  ventricular size is normal. No increase in right ventricular wall  thickness.   3. Left atrial size was normal.   4. Right atrial size was mildly dilated.   5. Trivial pericardial effusion is present.   6. The mitral valve is normal in structure. No evidence of mitral valve  regurgitation. No evidence of mitral stenosis.   7. The tricuspid valve is normal in structure. Tricuspid valve  regurgitation is trivial.   8. The aortic valve has an indeterminant number of cusps Aortic valve  regurgitation was not visualized by color flow Doppler. Structurally  normal aortic valve, with no evidence of sclerosis or stenosis.   9. The pulmonic valve was not well visualized. Pulmonic valve  regurgitation is not visualized by color flow Doppler.  10. Normal pulmonary artery systolic pressure.  11. The inferior vena cava is normal in size with greater than 50%  respiratory variability, suggesting right atrial pressure of 3 mmHg.     Coronary CTA 08/09/19   1. Coronary calcium score of 190. This was 63 percentile for age and sex  matched control.   2. Normal coronary origin with right dominance.   3. CAD-RADS 2. Mild non-obstructive CAD (25-49%) in the proximal portion of LAD, RCA and OM1. Consider non-atherosclerotic causes of chest pain. Consider preventive therapy and risk factor modification.    Wynonia Musty [facility] Stay [facility]/Anesthesiology Phone [phone] 10/15/2022 11:52 AM

## 2022-10-15 NOTE — Anesthesia Preprocedure Evaluation (Addendum)
Anesthesia Evaluation  Patient identified by MRN, date of birth, ID band Patient awake    Reviewed: Allergy & Precautions, NPO status , Patient's Chart, lab work & pertinent test results  Airway Mallampati: I  TM Distance: >3 FB Neck ROM: Full    Dental  (+) Teeth Intact, Dental Advisory Given   Pulmonary neg pulmonary ROS   Pulmonary exam normal breath sounds clear to auscultation       Cardiovascular hypertension, Pt. on medications + CAD  Normal cardiovascular exam Rhythm:Regular Rate:Normal     Neuro/Psych  Headaches    GI/Hepatic Neg liver ROS, PUD,,,UC   Endo/Other  negative endocrine ROS    Renal/GU negative Renal ROS     Musculoskeletal  (+) Arthritis ,    Abdominal   Peds  Hematology  (+) Blood dyscrasia, anemia Plt 177k   Anesthesia Other Findings   Reproductive/Obstetrics                             Anesthesia Physical Anesthesia Plan  ASA: 3  Anesthesia Plan: Spinal   Post-op Pain Management: Tylenol PO (pre-op)* and Regional block*   Induction: Intravenous  PONV Risk Score and Plan: 2 and Midazolam, TIVA, Dexamethasone and Ondansetron  Airway Management Planned: Natural Airway and Simple Face Mask  Additional Equipment:   Intra-op Plan:   Post-operative Plan:   Informed Consent: I have reviewed the patients History and Physical, chart, labs and discussed the procedure including the risks, benefits and alternatives for the proposed anesthesia with the patient or authorized representative who has indicated his/her understanding and acceptance.     Dental advisory given  Plan Discussed with: CRNA  Anesthesia Plan Comments: (PAT note by Karoline Caldwell, PA-C: Follows with cardiology for history of HTN, HLD, noncardiac chest pain.  She was initially referred to cardiology by PCP for evaluation of chest pain and seen on 07/06/2019.Coronary CTA revealed calcium  score of 190, 89th percentile for age and sex matched control, mild nonobstructive CAD in the proximal portion of LAD, RCA, and OM1. Lexiscan Myoview was ordered to rule out ischemia and it revealed no evidence of prior infarct or ischemia. Echo 07/18/2019 revealed LVEF 60 to 65%, no LVH, normal RV, normal valve function. At office visit on 01/12/22, she reported increasing episodes of midsternal chest tightness that occur when she is walking accompanied by shortness of breath.Left heart cath on 01/21/22 revealed 20% stenosis proximal RCA and proximal LAD vessels, normal LVEDP, LVEF 55-65%. Last seen by Dr. Radford Pax 08/17/2022 and noted to be doing well.  She reports that she still occasionally has some chest tightness with associated SOB the last 3 to 4 minutes and resolves.  Per note, "1. CAD without angina:  -Mild non-obstructive disease by cardiac cath 01/21/22.  -she has chronic non cardiac CP that is stable -No indication for further testing at this time.  -Continue statin  -no ASA due to ulcerative colitis."  1 year follow-up recommended.  Preop labs reviewed, mild anemia with hemoglobin 11.7, otherwise unremarkable.  EKG 01/12/2022: NSR.  Rate 75.  LHC 01/21/22  Prox RCA lesion is 20% stenosed.  Prox LAD lesion is 20% stenosed.  The left ventricular systolic function is normal.  LV end diastolic pressure is normal.  The left ventricular ejection fraction is 55-65% by visual estimate.  Mild non-obstructive CAD Normal LV systolic function  Recommendations: Medical management of mild CAD. Explore other causes of chest pain.   Lexiscan [employer]  08/28/19  ? Nuclear stress EF: 64%. ? There was no ST segment deviation noted during stress. ? The study is normal. ? This is a low risk study. ? The left ventricular ejection fraction is normal (55-65%).  Normal pharmacologic nuclear stress test with no evidence for prior infarct or ischemia. LVEF 64%.  Echo 07/18/19 1. Left  ventricular ejection fraction, by visual estimation, is 60 to  65%. The left ventricle has normal function. Normal left ventricular size.  There is no left ventricular hypertrophy.  2. Global right ventricle has normal systolic function.The right  ventricular size is normal. No increase in right ventricular wall  thickness.  3. Left atrial size was normal.  4. Right atrial size was mildly dilated.  5. Trivial pericardial effusion is present.  6. The mitral valve is normal in structure. No evidence of mitral valve  regurgitation. No evidence of mitral stenosis.  7. The tricuspid valve is normal in structure. Tricuspid valve  regurgitation is trivial.  8. The aortic valve has an indeterminant number of cusps Aortic valve  regurgitation was not visualized by color flow Doppler. Structurally  normal aortic valve, with no evidence of sclerosis or stenosis.  9. The pulmonic valve was not well visualized. Pulmonic valve  regurgitation is not visualized by color flow Doppler.  10. Normal pulmonary artery systolic pressure.  11. The inferior vena cava is normal in size with greater than 50%  respiratory variability, suggesting right atrial pressure of 3 mmHg.    Coronary CTA 08/09/19  1. Coronary calcium score of 190. This was 62 percentile for age and sex matched control.  2. Normal coronary origin with right dominance.  3. CAD-RADS 2. Mild non-obstructive CAD (25-49%) in the proximal portion of LAD, RCA and OM1. Consider non-atherosclerotic causes of chest pain. Consider preventive therapy and risk factor modification.   )        Anesthesia Quick Evaluation

## 2022-10-15 NOTE — Telephone Encounter (Signed)
I resent to [employer]!

## 2022-10-16 ENCOUNTER — Telehealth: Payer: Self-pay | Admitting: *Deleted

## 2022-10-16 NOTE — Care Plan (Signed)
[facility] RNCM call to patient to discuss her upcoming Left total knee arthroplasty with Dr. Erlinda Hong on 10/19/22. She is an Ortho bundle patient through [facility] and is agreeable to case management. She will be going to her son's home after discharge for assistance. She has a RW, and has ordered a BSC. She has had the CPM delivered as well by [employer] for post op use. Anticipate HHPT will be needed after a short hospital stay. Choice provided and referral given to [facility]. Reviewed post op instructions and copy emailed to patient. Will continue to follow for needs.

## 2022-10-16 NOTE — Telephone Encounter (Signed)
Ortho bundle pre-op call completed. 

## 2022-10-19 ENCOUNTER — Ambulatory Visit (HOSPITAL_COMMUNITY): Payer: Medicare Other | Admitting: Physician Assistant

## 2022-10-19 ENCOUNTER — Other Ambulatory Visit: Payer: Self-pay

## 2022-10-19 ENCOUNTER — Observation Stay (HOSPITAL_COMMUNITY)
Admission: RE | Admit: 2022-10-19 | Discharge: 2022-10-20 | Disposition: A | Discharge status: Home / Self Care | Payer: Medicare Other | Attending: Orthopaedic Surgery | Admitting: Orthopaedic Surgery

## 2022-10-19 ENCOUNTER — Encounter (HOSPITAL_COMMUNITY): Payer: Self-pay | Admitting: Orthopaedic Surgery

## 2022-10-19 ENCOUNTER — Encounter (HOSPITAL_COMMUNITY)
Admission: RE | Disposition: A | Discharge status: Home / Self Care | Payer: Self-pay | Source: Home / Self Care | Attending: Orthopaedic Surgery

## 2022-10-19 ENCOUNTER — Observation Stay (HOSPITAL_COMMUNITY): Payer: Medicare Other

## 2022-10-19 DIAGNOSIS — D649 Anemia, unspecified: Secondary | ICD-10-CM

## 2022-10-19 DIAGNOSIS — I10 Essential (primary) hypertension: Secondary | ICD-10-CM

## 2022-10-19 DIAGNOSIS — M1712 Unilateral primary osteoarthritis, left knee: Principal | ICD-10-CM | POA: Diagnosis present

## 2022-10-19 DIAGNOSIS — Z7901 Long term (current) use of anticoagulants: Secondary | ICD-10-CM | POA: Insufficient documentation

## 2022-10-19 DIAGNOSIS — Z96652 Presence of left artificial knee joint: Secondary | ICD-10-CM

## 2022-10-19 DIAGNOSIS — Z79899 Other long term (current) drug therapy: Secondary | ICD-10-CM | POA: Diagnosis not present

## 2022-10-19 DIAGNOSIS — I2510 Atherosclerotic heart disease of native coronary artery without angina pectoris: Secondary | ICD-10-CM | POA: Diagnosis not present

## 2022-10-19 DIAGNOSIS — Z85828 Personal history of other malignant neoplasm of skin: Secondary | ICD-10-CM | POA: Diagnosis not present

## 2022-10-19 HISTORY — PX: TOTAL KNEE ARTHROPLASTY: SHX125

## 2022-10-19 LAB — CREATININE, SERUM
Creatinine, Ser: 0.98 mg/dL (ref 0.44–1.00)
GFR, Estimated: >60 mL/min (ref >60)

## 2022-10-19 LAB — CBC
HCT: 35.8 % — ABNORMAL LOW (ref 36.0–46.0)
Hemoglobin: 11.9 g/dL — ABNORMAL LOW (ref 12.0–15.0)
MCH: 30.1 pg (ref 26.0–34.0)
MCHC: 33.2 g/dL (ref 30.0–36.0)
MCV: 90.6 fL (ref 80.0–100.0)
Platelets: 176 10*3/uL (ref 150–400)
RBC: 3.95 MIL/uL (ref 3.87–5.11)
RDW: 12.4 % (ref 11.5–15.5)
WBC: 6.8 10*3/uL (ref 4.0–10.5)
nRBC: 0 % (ref 0.0–0.2)

## 2022-10-19 SURGERY — ARTHROPLASTY, KNEE, TOTAL
Anesthesia: Spinal | Site: Knee | Laterality: Left

## 2022-10-19 MED ORDER — METOCLOPRAMIDE HCL 5 MG/ML IJ SOLN: 5.0000 mg | Freq: Three times a day (TID) | INTRAMUSCULAR | Status: DC | PRN

## 2022-10-19 MED ORDER — ACETAMINOPHEN 500 MG PO TABS
1000.0000 mg | ORAL_TABLET | Freq: Four times a day (QID) | ORAL | Status: AC
  Administered 2022-10-19 – 2022-10-20 (×4): 1000 mg via ORAL
  Filled 2022-10-19 (×3): qty 2

## 2022-10-19 MED ORDER — ACETAMINOPHEN 325 MG PO TABS: 325.0000 mg | ORAL_TABLET | Freq: Four times a day (QID) | ORAL | Status: DC | PRN

## 2022-10-19 MED ORDER — CHLORHEXIDINE GLUCONATE 0.12 % MT SOLN
OROMUCOSAL | Status: AC
  Administered 2022-10-19: 15 mL via OROMUCOSAL
  Filled 2022-10-19: qty 15

## 2022-10-19 MED ORDER — ONDANSETRON HCL 4 MG/2ML IJ SOLN
INTRAMUSCULAR | Status: DC | PRN
  Administered 2022-10-19: 4 mg via INTRAVENOUS

## 2022-10-19 MED ORDER — MIDAZOLAM HCL 2 MG/2ML IJ SOLN
INTRAMUSCULAR | Status: DC | PRN
  Administered 2022-10-19: 2 mg via INTRAVENOUS

## 2022-10-19 MED ORDER — ACETAMINOPHEN 500 MG PO TABS
ORAL_TABLET | ORAL | Status: AC
  Administered 2022-10-19: 1000 mg via ORAL
  Filled 2022-10-19: qty 2

## 2022-10-19 MED ORDER — FENTANYL CITRATE (PF) 250 MCG/5ML IJ SOLN
INTRAMUSCULAR | Status: AC
  Filled 2022-10-19: qty 5

## 2022-10-19 MED ORDER — FENTANYL CITRATE (PF) 100 MCG/2ML IJ SOLN
INTRAMUSCULAR | Status: AC
  Filled 2022-10-19: qty 2

## 2022-10-19 MED ORDER — BUPIVACAINE-MELOXICAM ER 400-12 MG/14ML IJ SOLN
INTRAMUSCULAR | Status: DC | PRN
  Administered 2022-10-19: 400 mg

## 2022-10-19 MED ORDER — ENOXAPARIN SODIUM 30 MG/0.3ML IJ SOSY
30.0000 mg | PREFILLED_SYRINGE | Freq: Two times a day (BID) | INTRAMUSCULAR | Status: DC
  Administered 2022-10-20: 30 mg via SUBCUTANEOUS
  Filled 2022-10-19: qty 0.3

## 2022-10-19 MED ORDER — OXYCODONE HCL 5 MG PO TABS: 10.0000 mg | ORAL_TABLET | ORAL | Status: DC | PRN

## 2022-10-19 MED ORDER — 0.9 % SODIUM CHLORIDE (POUR BTL) OPTIME
TOPICAL | Status: DC | PRN
  Administered 2022-10-19: 1000 mL

## 2022-10-19 MED ORDER — LACTATED RINGERS IV SOLN: INTRAVENOUS | Status: DC

## 2022-10-19 MED ORDER — DEXAMETHASONE SODIUM PHOSPHATE 10 MG/ML IJ SOLN
10.0000 mg | Freq: Once | INTRAMUSCULAR | Status: AC
  Administered 2022-10-20: 10 mg via INTRAVENOUS
  Filled 2022-10-19: qty 1

## 2022-10-19 MED ORDER — ONDANSETRON HCL 4 MG PO TABS: 4.0000 mg | ORAL_TABLET | Freq: Four times a day (QID) | ORAL | Status: DC | PRN

## 2022-10-19 MED ORDER — OXYCODONE HCL 5 MG PO TABS
ORAL_TABLET | ORAL | Status: AC
  Filled 2022-10-19: qty 2

## 2022-10-19 MED ORDER — ROPIVACAINE HCL 5 MG/ML IJ SOLN
INTRAMUSCULAR | Status: DC | PRN
  Administered 2022-10-19: 20 mL via PERINEURAL

## 2022-10-19 MED ORDER — MIDAZOLAM HCL 2 MG/2ML IJ SOLN
INTRAMUSCULAR | Status: AC
  Filled 2022-10-19: qty 2

## 2022-10-19 MED ORDER — ONDANSETRON HCL 4 MG/2ML IJ SOLN: 4.0000 mg | Freq: Once | INTRAMUSCULAR | Status: DC | PRN

## 2022-10-19 MED ORDER — SODIUM CHLORIDE 0.9 % IV SOLN: INTRAVENOUS | Status: DC

## 2022-10-19 MED ORDER — METHOCARBAMOL 1000 MG/10ML IJ SOLN
500.0000 mg | Freq: Four times a day (QID) | INTRAVENOUS | Status: DC | PRN
  Filled 2022-10-19: qty 5

## 2022-10-19 MED ORDER — KETOROLAC TROMETHAMINE 30 MG/ML IJ SOLN
INTRAMUSCULAR | Status: AC
  Filled 2022-10-19: qty 1

## 2022-10-19 MED ORDER — SODIUM CHLORIDE 0.9 % IR SOLN
Status: DC | PRN
  Administered 2022-10-19: 1000 mL

## 2022-10-19 MED ORDER — LOSARTAN POTASSIUM 50 MG PO TABS
25.0000 mg | ORAL_TABLET | Freq: Every day | ORAL | Status: DC
  Administered 2022-10-19 – 2022-10-20 (×2): 25 mg via ORAL
  Filled 2022-10-19 (×2): qty 1

## 2022-10-19 MED ORDER — POVIDONE-IODINE 10 % EX SWAB
2.0000 | Freq: Once | CUTANEOUS | Status: AC
  Administered 2022-10-19: 2 via TOPICAL

## 2022-10-19 MED ORDER — TRANEXAMIC ACID-NACL 1000-0.7 MG/100ML-% IV SOLN
INTRAVENOUS | Status: AC
  Filled 2022-10-19: qty 100

## 2022-10-19 MED ORDER — BUPIVACAINE-MELOXICAM ER 400-12 MG/14ML IJ SOLN
INTRAMUSCULAR | Status: AC
  Filled 2022-10-19: qty 1

## 2022-10-19 MED ORDER — CEFAZOLIN SODIUM-DEXTROSE 2-4 GM/100ML-% IV SOLN
2.0000 g | Freq: Four times a day (QID) | INTRAVENOUS | Status: AC
  Administered 2022-10-19 (×2): 2 g via INTRAVENOUS
  Filled 2022-10-19: qty 100

## 2022-10-19 MED ORDER — VANCOMYCIN HCL 1000 MG IV SOLR
INTRAVENOUS | Status: AC
  Filled 2022-10-19: qty 20

## 2022-10-19 MED ORDER — PHENOL 1.4 % MT LIQD: 1.0000 | OROMUCOSAL | Status: DC | PRN

## 2022-10-19 MED ORDER — KETOROLAC TROMETHAMINE 15 MG/ML IJ SOLN
INTRAMUSCULAR | Status: AC
  Filled 2022-10-19: qty 1

## 2022-10-19 MED ORDER — FERROUS SULFATE 325 (65 FE) MG PO TABS
325.0000 mg | ORAL_TABLET | Freq: Three times a day (TID) | ORAL | Status: DC
  Administered 2022-10-19 – 2022-10-20 (×3): 325 mg via ORAL
  Filled 2022-10-19 (×3): qty 1

## 2022-10-19 MED ORDER — CEFAZOLIN SODIUM-DEXTROSE 2-4 GM/100ML-% IV SOLN
INTRAVENOUS | Status: AC
  Filled 2022-10-19: qty 100

## 2022-10-19 MED ORDER — TRANEXAMIC ACID 1000 MG/10ML IV SOLN
2000.0000 mg | INTRAVENOUS | Status: DC
  Filled 2022-10-19: qty 20

## 2022-10-19 MED ORDER — METHOCARBAMOL 500 MG PO TABS
500.0000 mg | ORAL_TABLET | Freq: Four times a day (QID) | ORAL | Status: DC | PRN
  Administered 2022-10-19 (×2): 500 mg via ORAL
  Filled 2022-10-19: qty 1

## 2022-10-19 MED ORDER — CHLORHEXIDINE GLUCONATE 0.12 % MT SOLN: 15.0000 mL | Freq: Once | OROMUCOSAL | Status: AC

## 2022-10-19 MED ORDER — FENTANYL CITRATE (PF) 250 MCG/5ML IJ SOLN
INTRAMUSCULAR | Status: DC | PRN
  Administered 2022-10-19 (×3): 50 ug via INTRAVENOUS

## 2022-10-19 MED ORDER — OXYCODONE HCL ER 10 MG PO T12A
10.0000 mg | EXTENDED_RELEASE_TABLET | Freq: Two times a day (BID) | ORAL | Status: DC
  Administered 2022-10-19 – 2022-10-20 (×2): 10 mg via ORAL
  Filled 2022-10-19 (×2): qty 1

## 2022-10-19 MED ORDER — TRANEXAMIC ACID 1000 MG/10ML IV SOLN
INTRAVENOUS | Status: DC | PRN
  Administered 2022-10-19: 2000 mg via TOPICAL

## 2022-10-19 MED ORDER — MENTHOL 3 MG MT LOZG: 1.0000 | LOZENGE | OROMUCOSAL | Status: DC | PRN

## 2022-10-19 MED ORDER — PHENYLEPHRINE HCL-NACL 20-0.9 MG/250ML-% IV SOLN
INTRAVENOUS | Status: DC | PRN
  Administered 2022-10-19: 20 ug/min via INTRAVENOUS

## 2022-10-19 MED ORDER — TRANEXAMIC ACID-NACL 1000-0.7 MG/100ML-% IV SOLN
1000.0000 mg | INTRAVENOUS | Status: AC
  Administered 2022-10-19: 1000 mg via INTRAVENOUS

## 2022-10-19 MED ORDER — PRONTOSAN WOUND IRRIGATION OPTIME
TOPICAL | Status: DC | PRN
  Administered 2022-10-19: 1

## 2022-10-19 MED ORDER — CEFAZOLIN SODIUM-DEXTROSE 2-4 GM/100ML-% IV SOLN
2.0000 g | INTRAVENOUS | Status: AC
  Administered 2022-10-19: 2 g via INTRAVENOUS

## 2022-10-19 MED ORDER — DEXAMETHASONE SODIUM PHOSPHATE 10 MG/ML IJ SOLN
INTRAMUSCULAR | Status: DC | PRN
  Administered 2022-10-19: 10 mg via INTRAVENOUS

## 2022-10-19 MED ORDER — PROPOFOL 500 MG/50ML IV EMUL
INTRAVENOUS | Status: DC | PRN
  Administered 2022-10-19: 50 ug/kg/min via INTRAVENOUS

## 2022-10-19 MED ORDER — HYDROMORPHONE HCL 1 MG/ML IJ SOLN: 0.5000 mg | INTRAMUSCULAR | Status: DC | PRN

## 2022-10-19 MED ORDER — ONDANSETRON HCL 4 MG/2ML IJ SOLN: 4.0000 mg | Freq: Four times a day (QID) | INTRAMUSCULAR | Status: DC | PRN

## 2022-10-19 MED ORDER — ORAL CARE MOUTH RINSE: 15.0000 mL | Freq: Once | OROMUCOSAL | Status: AC

## 2022-10-19 MED ORDER — KETOROLAC TROMETHAMINE 15 MG/ML IJ SOLN
7.5000 mg | Freq: Four times a day (QID) | INTRAMUSCULAR | Status: AC
  Administered 2022-10-19 – 2022-10-20 (×4): 7.5 mg via INTRAVENOUS
  Filled 2022-10-19 (×3): qty 1

## 2022-10-19 MED ORDER — VANCOMYCIN HCL 1000 MG IV SOLR
INTRAVENOUS | Status: DC | PRN
  Administered 2022-10-19: 1000 mg via TOPICAL

## 2022-10-19 MED ORDER — ACETAMINOPHEN 500 MG PO TABS: 1000.0000 mg | ORAL_TABLET | Freq: Once | ORAL | Status: AC

## 2022-10-19 MED ORDER — METHOCARBAMOL 500 MG PO TABS
ORAL_TABLET | ORAL | Status: AC
  Filled 2022-10-19: qty 1

## 2022-10-19 MED ORDER — TRANEXAMIC ACID-NACL 1000-0.7 MG/100ML-% IV SOLN
1000.0000 mg | Freq: Once | INTRAVENOUS | Status: AC
  Administered 2022-10-19: 1000 mg via INTRAVENOUS

## 2022-10-19 MED ORDER — OXYCODONE HCL 5 MG PO TABS
5.0000 mg | ORAL_TABLET | ORAL | Status: DC | PRN
  Administered 2022-10-19: 10 mg via ORAL
  Administered 2022-10-19: 5 mg via ORAL
  Administered 2022-10-19: 10 mg via ORAL
  Filled 2022-10-19: qty 2
  Filled 2022-10-19: qty 1

## 2022-10-19 MED ORDER — FENTANYL CITRATE (PF) 100 MCG/2ML IJ SOLN
25.0000 ug | INTRAMUSCULAR | Status: DC | PRN
  Administered 2022-10-19 (×2): 25 ug via INTRAVENOUS

## 2022-10-19 MED ORDER — DOCUSATE SODIUM 100 MG PO CAPS
100.0000 mg | ORAL_CAPSULE | Freq: Two times a day (BID) | ORAL | Status: DC
  Administered 2022-10-19 – 2022-10-20 (×2): 100 mg via ORAL
  Filled 2022-10-19: qty 1

## 2022-10-19 MED ORDER — METOCLOPRAMIDE HCL 5 MG PO TABS: 5.0000 mg | ORAL_TABLET | Freq: Three times a day (TID) | ORAL | Status: DC | PRN

## 2022-10-19 SURGICAL SUPPLY — 86 items
ALCOHOL 70% 16 OZ (MISCELLANEOUS) ×1 IMPLANT
BAG COUNTER SPONGE SURGICOUNT (BAG) IMPLANT
BAG DECANTER FOR FLEXI CONT (MISCELLANEOUS) ×1 IMPLANT
BANDAGE ESMARK 6X9 LF (GAUZE/BANDAGES/DRESSINGS) IMPLANT
BLADE SAG 18X100X1.27 (BLADE) ×1 IMPLANT
BLADE SAW SGTL 13.0X1.19X90.0M (BLADE) IMPLANT
BLADE SAW SGTL 73X25 THK (BLADE) ×1 IMPLANT
BNDG ESMARK 6X9 LF (GAUZE/BANDAGES/DRESSINGS) ×1
BOWL SMART MIX CTS (DISPOSABLE) ×1 IMPLANT
CEMENT BONE REFOBACIN R1X40 US (Cement) IMPLANT
CLSR STERI-STRIP ANTIMIC 1/2X4 (GAUZE/BANDAGES/DRESSINGS) ×2 IMPLANT
COMP FEM CMT CR PERS SZ8 LT (Joint) ×1 IMPLANT
COMPONENT FEM CMT CR PRS SZ8LT (Joint) IMPLANT
COOLER ICEMAN CLASSIC (MISCELLANEOUS) ×1 IMPLANT
COVER SURGICAL LIGHT HANDLE (MISCELLANEOUS) ×1 IMPLANT
CUFF TOURN SGL QUICK 34 (TOURNIQUET CUFF) ×1
CUFF TOURN SGL QUICK 42 (TOURNIQUET CUFF) IMPLANT
CUFF TRNQT CYL 34X4.125X (TOURNIQUET CUFF) ×1 IMPLANT
DERMABOND ADVANCED .7 DNX12 (GAUZE/BANDAGES/DRESSINGS) ×1 IMPLANT
DRAPE EXTREMITY T 121X128X90 (DISPOSABLE) ×1 IMPLANT
DRAPE HALF SHEET 40X57 (DRAPES) ×1 IMPLANT
DRAPE INCISE IOBAN 66X45 STRL (DRAPES) ×1 IMPLANT
DRAPE ORTHO SPLIT 77X108 STRL (DRAPES) ×1
DRAPE POUCH INSTRU U-SHP 10X18 (DRAPES) ×1 IMPLANT
DRAPE SURG ORHT 6 SPLT 77X108 (DRAPES) IMPLANT
DRAPE U-SHAPE 47X51 STRL (DRAPES) ×2 IMPLANT
DRSG AQUACEL AG ADV 3.5X10 (GAUZE/BANDAGES/DRESSINGS) ×1 IMPLANT
DURAPREP 26ML APPLICATOR (WOUND CARE) ×3 IMPLANT
ELECT CAUTERY BLADE 6.4 (BLADE) ×1 IMPLANT
ELECT PENCIL ROCKER SW 15FT (MISCELLANEOUS) ×1 IMPLANT
ELECT REM PT RETURN 9FT ADLT (ELECTROSURGICAL) ×1
ELECTRODE REM PT RTRN 9FT ADLT (ELECTROSURGICAL) ×1 IMPLANT
GLOVE BIOGEL PI IND STRL 7.0 (GLOVE) ×2 IMPLANT
GLOVE BIOGEL PI IND STRL 7.5 (GLOVE) ×5 IMPLANT
GLOVE ECLIPSE 7.0 STRL STRAW (GLOVE) ×3 IMPLANT
GLOVE INDICATOR 7.0 STRL GRN (GLOVE) ×1 IMPLANT
GLOVE INDICATOR 7.5 STRL GRN (GLOVE) ×1 IMPLANT
GLOVE SURG SYN 7.5  E (GLOVE) ×2
GLOVE SURG SYN 7.5 E (GLOVE) ×2 IMPLANT
GLOVE SURG SYN 7.5 PF PI (GLOVE) ×2 IMPLANT
GLOVE SURG UNDER LTX SZ7.5 (GLOVE) ×2 IMPLANT
GLOVE SURG UNDER POLY LF SZ7 (GLOVE) ×2 IMPLANT
GOWN STRL REIN XL XLG (GOWN DISPOSABLE) ×1 IMPLANT
GOWN STRL REUS W/ TWL LRG LVL3 (GOWN DISPOSABLE) ×1 IMPLANT
GOWN STRL REUS W/TWL LRG LVL3 (GOWN DISPOSABLE) ×1
GOWN TOGA ZIPPER T7+ PEEL AWAY (MISCELLANEOUS) ×2 IMPLANT
HANDPIECE INTERPULSE COAX TIP (DISPOSABLE) ×1
HDLS TROCR DRIL PIN KNEE 75 (PIN) ×1
HOOD PEEL AWAY FLYTE STAYCOOL (MISCELLANEOUS) ×1 IMPLANT
KIT BASIN OR (CUSTOM PROCEDURE TRAY) ×1 IMPLANT
KIT TURNOVER KIT B (KITS) ×1 IMPLANT
MANIFOLD NEPTUNE II (INSTRUMENTS) ×1 IMPLANT
MARKER SKIN DUAL TIP RULER LAB (MISCELLANEOUS) ×2 IMPLANT
NDL SPNL 18GX3.5 QUINCKE PK (NEEDLE) ×1 IMPLANT
NEEDLE SPNL 18GX3.5 QUINCKE PK (NEEDLE) ×1 IMPLANT
NS IRRIG 1000ML POUR BTL (IV SOLUTION) ×1 IMPLANT
PACK TOTAL JOINT (CUSTOM PROCEDURE TRAY) ×1 IMPLANT
PAD ARMBOARD 7.5X6 YLW CONV (MISCELLANEOUS) ×2 IMPLANT
PAD COLD SHLDR WRAP-ON (PAD) ×1 IMPLANT
PIN DRILL HDLS TROCAR 75 4PK (PIN) IMPLANT
SAW OSC TIP CART 19.5X105X1.3 (SAW) ×1 IMPLANT
SCREW FEMALE HEX FIX 25X2.5 (ORTHOPEDIC DISPOSABLE SUPPLIES) IMPLANT
SET HNDPC FAN SPRY TIP SCT (DISPOSABLE) ×1 IMPLANT
SOLUTION PRONTOSAN WOUND 350ML (IRRIGATION / IRRIGATOR) ×1 IMPLANT
STAPLER VISISTAT 35W (STAPLE) IMPLANT
STEM POLY PAT PLY 29M KNEE (Knees) IMPLANT
STEM TIB ST PERS 14+30 (Stem) IMPLANT
STEM TIBIA 5 DEG SZ E L KNEE (Knees) IMPLANT
STEM TIBIAL 10 8-11 EF POLY LT (Joint) IMPLANT
SUCTION FRAZIER HANDLE 10FR (MISCELLANEOUS) ×1
SUCTION TUBE FRAZIER 10FR DISP (MISCELLANEOUS) ×1 IMPLANT
SUT ETHILON 2 0 FS 18 (SUTURE) IMPLANT
SUT MNCRL AB 3-0 PS2 27 (SUTURE) IMPLANT
SUT VIC AB 0 CT1 27 (SUTURE) ×2
SUT VIC AB 0 CT1 27XBRD ANBCTR (SUTURE) ×2 IMPLANT
SUT VIC AB 1 CTX 27 (SUTURE) ×3 IMPLANT
SUT VIC AB 2-0 CT1 27 (SUTURE) ×4
SUT VIC AB 2-0 CT1 TAPERPNT 27 (SUTURE) ×4 IMPLANT
SYR 50ML LL SCALE MARK (SYRINGE) ×2 IMPLANT
TIBIA STEM 5 DEG SZ E L KNEE (Knees) ×1 IMPLANT
TOWEL GREEN STERILE (TOWEL DISPOSABLE) ×1 IMPLANT
TOWEL GREEN STERILE FF (TOWEL DISPOSABLE) ×1 IMPLANT
TRAY CATH 16FR W/PLASTIC CATH (SET/KITS/TRAYS/PACK) IMPLANT
TUBE SUCT ARGYLE STRL (TUBING) ×1 IMPLANT
UNDERPAD 30X36 HEAVY ABSORB (UNDERPADS AND DIAPERS) ×1 IMPLANT
YANKAUER SUCT BULB TIP NO VENT (SUCTIONS) ×2 IMPLANT

## 2022-10-19 NOTE — Plan of Care (Signed)
  Problem: Education: Goal: Knowledge of the prescribed therapeutic regimen will improve Outcome: Progressing Goal: Individualized Educational Video(s) Outcome: Progressing   Problem: Activity: Goal: Ability to avoid complications of mobility impairment will improve Outcome: Progressing Goal: Range of joint motion will improve Outcome: Progressing   Problem: Clinical Measurements: Goal: Postoperative complications will be avoided or minimized Outcome: Progressing   Problem: Pain Management: Goal: Pain level will decrease with appropriate interventions Outcome: Progressing   Problem: Education: Goal: Knowledge of General Education information will improve Description: Including pain rating scale, medication(s)/side effects and non-pharmacologic comfort measures Outcome: Progressing   Problem: Health Behavior/Discharge Planning: Goal: Ability to manage health-related needs will improve Outcome: Progressing   Problem: Clinical Measurements: Goal: Ability to maintain clinical measurements within normal limits will improve Outcome: Progressing Goal: Will remain free from infection Outcome: Progressing Goal: Diagnostic test results will improve Outcome: Progressing Goal: Respiratory complications will improve Outcome: Progressing Goal: Cardiovascular complication will be avoided Outcome: Progressing

## 2022-10-19 NOTE — Evaluation (Signed)
Physical Therapy Evaluation Patient Details Name: Andrea [name] MRN: [id number] DOB: 12/20/1955 Today's Date: 10/19/2022  History of Present Illness  67 y.o. female presents to [facility] hospital on 10/19/2022 for elective L TKA. PMH includes OA, DOE, HTN, migraine, skin cancer, ulcerative colitis.  Clinical Impression  Pt presents to PT with deficits in strength, ROM, gait, balance, endurance, power. Pt is able to transfer and ambulate for short distances without physical assistance. Pt demonstrates good quad activation at this time, PT provides education on HEP. Pt will benefit from frequent mobilization in an effort to improve endurance. PT will return tomorrow for further gait and stair training prior to discharge.       Recommendations for follow up therapy are one component of a multi-disciplinary discharge planning process, led by the attending physician.  Recommendations may be updated based on patient status, additional functional criteria and insurance authorization.  Follow Up Recommendations Follow physician's recommendations for discharge plan and follow up therapies      Assistance Recommended at Discharge PRN  Patient can return home with the following  A little help with bathing/dressing/bathroom;Assistance with cooking/housework;Help with stairs or ramp for entrance;Assist for transportation    Equipment Recommendations Rolling walker (2 wheels)  Recommendations for Other Services       Functional Status Assessment Patient has had a recent decline in their functional status and demonstrates the ability to make significant improvements in function in a reasonable and predictable amount of time.     Precautions / Restrictions Precautions Precautions: Knee;Fall Precaution Booklet Issued: Yes (comment) Restrictions Weight Bearing Restrictions: Yes LLE Weight Bearing: Weight bearing as tolerated      Mobility  Bed Mobility Overal bed mobility: Needs Assistance Bed  Mobility: Supine to Sit, Sit to Supine     Supine to sit: Supervision, HOB elevated Sit to supine: Supervision, HOB elevated        Transfers Overall transfer level: Needs assistance Equipment used: Rolling walker (2 wheels) Transfers: Sit to/from Stand Sit to Stand: Min guard           General transfer comment: increased time    Ambulation/Gait Ambulation/Gait assistance: Min guard Gait Distance (Feet): 20 Feet Assistive device: Rolling walker (2 wheels) Gait Pattern/deviations: Step-to pattern Gait velocity: reduced Gait velocity interpretation: <1.31 ft/sec, indicative of household ambulator   General Gait Details: slowed step-to gait, reduced stride length  Stairs            Wheelchair Mobility    Modified Rankin (Stroke Patients Only)       Balance Overall balance assessment: Needs assistance Sitting-balance support: No upper extremity supported, Feet supported Sitting balance-Leahy Scale: Normal     Standing balance support: Bilateral upper extremity supported, Reliant on assistive device for balance Standing balance-Leahy Scale: Poor                               Pertinent Vitals/Pain Pain Assessment Pain Assessment: 0-10 Pain Score: 1  Pain Location: L knee Pain Descriptors / Indicators: Aching Pain Intervention(s): Premedicated before session    Home Living Family/patient expects to be discharged to:: Private residence Living Arrangements: Children;Other relatives Available Help at Discharge: Family;Available 24 hours/day Type of Home: House Home Access: Stairs to enter Entrance Stairs-Rails: None Entrance Stairs-Number of Steps: 3   Home Layout: One level Home Equipment: Rollator (4 wheels);Cane - single point;Toilet riser;Shower seat      Prior Function Prior Level of Function : Independent/Modified  Independent;Driving             Mobility Comments: enjoys pickelball, ambulating with cane recently        Hand Dominance        Extremity/Trunk Assessment   Upper Extremity Assessment Upper Extremity Assessment: Overall WFL for tasks assessed    Lower Extremity Assessment Lower Extremity Assessment: LLE deficits/detail LLE Deficits / Details: ankle ROM WFL, PF/DF 4-/5, knee extension 3/5    Cervical / Trunk Assessment Cervical / Trunk Assessment: Normal  Communication   Communication: No difficulties  Cognition Arousal/Alertness: Awake/alert Behavior During Therapy: WFL for tasks assessed/performed Overall Cognitive Status: Impaired/Different from baseline Area of Impairment: Problem solving                             Problem Solving: Slow processing General Comments: attributes processing to pain medication        General Comments General comments (skin integrity, edema, etc.): VSS on RA, pt reports dizziness with initial sit to stand, BP 133/70, no further symptoms with 2nd stand and ambulation    Exercises     Assessment/Plan    PT Assessment Patient needs continued PT services  PT Problem List Decreased strength;Decreased range of motion;Decreased activity tolerance;Decreased balance;Decreased mobility;Decreased knowledge of use of DME;Pain       PT Treatment Interventions DME instruction;Gait training;Stair training;Functional mobility training;Therapeutic activities;Therapeutic exercise;Neuromuscular re-education;Balance training;Patient/family education    PT Goals (Current goals can be found in the Care Plan section)  Acute Rehab PT Goals Patient Stated Goal: to return to independence PT Goal Formulation: With patient Time For Goal Achievement: 10/24/22 Potential to Achieve Goals: Good    Frequency 7X/week     Co-evaluation               AM-PAC PT "6 Clicks" Mobility  Outcome Measure Help needed turning from your back to your side while in a flat bed without using bedrails?: A Little Help needed moving from lying on your back to  sitting on the side of a flat bed without using bedrails?: A Little Help needed moving to and from a bed to a chair (including a wheelchair)?: A Little Help needed standing up from a chair using your arms (e.g., wheelchair or bedside chair)?: A Little Help needed to walk in hospital room?: A Little Help needed climbing 3-5 steps with a railing? : Total 6 Click Score: 16    End of Session   Activity Tolerance: Patient tolerated treatment well Patient left: in bed;with call bell/phone within reach;with family/visitor present Nurse Communication: Mobility status PT Visit Diagnosis: Other abnormalities of gait and mobility (R26.89);Muscle weakness (generalized) (M62.81);Pain Pain - Right/Left: Left Pain - part of body: Knee    Time: 1340-1411 PT Time Calculation (min) (ACUTE ONLY): 31 min   Charges:   PT Evaluation $PT Eval Low Complexity: 1 Low          Zenaida Niece, PT, DPT Acute Rehabilitation Office [phone]   Zenaida Niece 10/19/2022, 2:30 PM

## 2022-10-19 NOTE — Op Note (Signed)
Total Knee Arthroplasty Procedure Note  Preoperative diagnosis: Left knee osteoarthritis  Postoperative diagnosis:same  Operative findings: Osteopenic bone Complete loss of articular cartilage from medial and patellofemoral compartments  Operative procedure: Left total knee arthroplasty. CPT [phone]  Surgeon: N. Eduard Roux, MD  Assist: Ky Barban, RNFA  Anesthesia: Spinal, regional, local  Tourniquet time: see anesthesia record  Implants used: Zimmer persona cemented Femur: CR 8 narrow Tibia: E with 30 mm cemented stem Patella: 29 mm Polyethylene: 10 mm, MC  Indication: Andrea [name] is a 67 y.o. year old female with a history of knee pain. Having failed conservative management, the patient elected to proceed with a total knee arthroplasty.  We have reviewed the risk and benefits of the surgery and they elected to proceed after voicing understanding.  Procedure:  After informed consent was obtained and understanding of the risk were voiced including but not limited to bleeding, infection, damage to surrounding structures including nerves and vessels, blood clots, leg length inequality and the failure to achieve desired results, the operative extremity was marked with verbal confirmation of the patient in the holding area.   The patient was then brought to the operating room and transported to the operating room table in the supine position.  A tourniquet was applied to the operative extremity around the upper thigh. The operative limb was then prepped and draped in the usual sterile fashion and preoperative antibiotics were administered.  A time out was performed prior to the start of surgery confirming the correct extremity, preoperative antibiotic administration, as well as team members, implants and instruments available for the case. Correct surgical site was also confirmed with preoperative radiographs. The limb was then elevated for exsanguination and the tourniquet was  inflated. A midline incision was made and a standard medial parapatellar approach was performed.  The infrapatellar fat pad was removed.  Suprapatellar synovium was removed to reveal the anterior distal femoral cortex.  A medial peel was performed to release the capsule of the medial tibial plateau.  The patella was then everted and was prepared and sized to a 29 mm.  A cover was placed on the patella for protection from retractors.  The knee was then brought into full flexion and we then turned our attention to the femur.  The cruciates were sacrificed.  Start site was drilled in the femur and the intramedullary distal femoral cutting guide was placed, set at 5 degrees valgus, taking 10 mm of distal resection. The distal cut was made. Osteophytes were then removed.  Next, the proximal tibial cutting guide was placed with appropriate slope, varus/valgus alignment and depth of resection. The proximal tibial cut was made taking 2 mm off the low side. Gap blocks were then used to assess the extension gap and alignment, and appropriate soft tissue releases were performed. Attention was turned back to the femur, which was sized using the sizing guide to a size 8 narrow. Appropriate rotation of the femoral component was determined using epicondylar axis, Whiteside's line, and assessing the flexion gap under ligament tension. The appropriate size 4-in-1 cutting block was placed and checked with an angel wing and cuts were made. Posterior femoral osteophytes and uncapped bone were then removed with the curved osteotome.  Trial components were placed, and stability was checked in full extension, mid-flexion, and deep flexion. Proper tibial rotation was determined and marked.  The patella tracked well without a lateral release.  The femoral lugs were then drilled. Trial components were then removed and tibial  preparation performed.  The tibia was sized for a size E component with a 30 mm cemented stem for added fixation due  to osteopenic bone The bony surfaces were irrigated with a pulse lavage and then dried. Bone cement was vacuum mixed on the back table, and the final components sized above were cemented into place.  Antibiotic irrigation was placed in the knee joint and soft tissues while the cement cured.  After cement had finished curing, excess cement was removed. The stability of the construct was re-evaluated throughout a range of motion and found to be acceptable. The trial liner was removed, the knee was copiously irrigated, and the knee was re-evaluated for any excess bone debris. The real polyethylene liner, 10 mm thick, was inserted and checked to ensure the locking mechanism had engaged appropriately. The tourniquet was deflated and hemostasis was achieved. The wound was irrigated with normal saline.  One gram of vancomycin powder was placed in the surgical bed.  Topical 0.25% bupivacaine and meloxicam was placed in the joint for postoperative pain.  Capsular closure was performed with a #1 vicryl, subcutaneous fat closed with a 0 vicryl suture, then subcutaneous tissue closed with interrupted 2.0 vicryl suture. The skin was then closed with a 2.0 nylon and dermabond. A sterile dressing was applied.  The patient was awakened in the operating room and taken to recovery in stable condition. All sponge, needle, and instrument counts were correct at the end of the case.  Tawanna Cooler was necessary for opening, closing, retracting, limb positioning and overall facilitation and completion of the surgery.  Position: supine  Complications: none.  Time Out: performed   Drains/Packing: none  Estimated blood loss: minimal  Returned to Recovery Room: in good condition.   Antibiotics: yes   Mechanical VTE (DVT) Prophylaxis: sequential compression devices, TED thigh-high  Chemical VTE (DVT) Prophylaxis: lovenox POD 1  Fluid Replacement  Crystalloid: see anesthesia record Blood: none  FFP: none   Specimens  Removed: 1 to pathology   Sponge and Instrument Count Correct? yes   PACU: portable radiograph - knee AP and Lateral   Plan/RTC: Return in 2 weeks for wound check.   Weight Bearing/Load Lower Extremity: full   Implant Name Type Inv. Item Serial No. Manufacturer Lot No. LRB No. Used Action  COMP FEM CMT CR PERS SZ8 LT - [id number] Joint COMP FEM CMT CR PERS SZ8 LT  [employer]IMMER [employer]ON(ORTH,TRAU,BIO,SG) [id number] Left 1 Implanted  STEM TIB ST PERS 14+30 - [id number] Stem STEM TIB ST PERS 14+30  ZIMMER [employer]ON(ORTH,TRAU,BIO,SG) [id number] Left 1 Implanted  TIBIA STEM 5 DEG SZ E L KNEE - [id number] Knees TIBIA STEM 5 DEG SZ E L KNEE  ZIMMER RECON(ORTH,TRAU,BIO,SG) [id number] Left 1 Implanted  STEM POLY PAT PLY 80M KNEE - [id number] Knees STEM POLY PAT PLY 80M KNEE  ZIMMER [employer]ON(ORTH,TRAU,BIO,SG) [id number] Left 1 Implanted  STEM TIBIAL 10 8-11 EF POLY LT - [id number] Joint STEM TIBIAL 10 8-11 EF POLY LT  ZIMMER [employer]ON(ORTH,TRAU,BIO,SG) [id number] Left 1 Implanted  CEMENT BONE REFOBACIN R1X40 [place] - [id number] Cement CEMENT BONE REFOBACIN R1X40 [place]  Z[employer]MER RECON(ORTH,TRAU,BIO,SG) [id number] Left 2 Implanted    N. Eduard Roux, MD [facility] 8:48 AM

## 2022-10-19 NOTE — Discharge Instructions (Signed)

## 2022-10-19 NOTE — Transfer of Care (Signed)
Immediate Anesthesia Transfer of Care Note  Patient: Andrea [name]  Procedure(s) Performed: LEFT TOTAL KNEE ARTHROPLASTY (Left: Knee)  Patient Location: PACU  Anesthesia Type:Spinal and MAC combined with regional for post-op pain  Level of Consciousness: drowsy and patient cooperative  Airway & Oxygen Therapy: Patient Spontanous Breathing and Patient connected to nasal cannula oxygen  Post-op Assessment: Report given to RN and Post -op Vital signs reviewed and stable  Post vital signs: Reviewed and stable  Last Vitals:  Vitals Value Taken Time  BP 151/89 10/19/22 0946  Temp    Pulse 68 10/19/22 0950  Resp 15 10/19/22 0950  SpO2 86 % 10/19/22 0950  Vitals shown include unvalidated device data.  Last Pain:  Vitals:   10/19/22 0615  TempSrc:   PainSc: 0-No pain         Complications: No notable events documented.

## 2022-10-19 NOTE — H&P (Signed)
PREOPERATIVE H&P  Chief Complaint: LEFT KNEE OSTEOARTHRITIS  HPI: Andrea [name] is a 67 y.o. female who presents for surgical treatment of LEFT KNEE OSTEOARTHRITIS.  She denies any changes in medical history.  Past Medical History:  Diagnosis Date   Anemia    Anginal pain (HCC)    Arthritis    Chest discomfort    Disorder of immune system ([place])    Dyspnea    Dyspnea on exertion    Hypercholesterolemia    Hypertension    Migraine    Non-ulcer dyspepsia    Osteopenia of spine    Peptic ulcer disease    Pneumonia    Polyp of rectum    cancerous   Pre-diabetes    Skin cancer    Ulcerative colitis ([place])    Past Surgical History:  Procedure Laterality Date   ANKLE FRACTURE SURGERY Left    CARDIAC CATHETERIZATION     COLONOSCOPY     ESOPHAGOGASTRODUODENOSCOPY     hip replacement Bilateral    LEFT HEART CATH AND CORONARY ANGIOGRAPHY N/A 01/21/2022   Procedure: LEFT HEART CATH AND CORONARY ANGIOGRAPHY;  Surgeon: Burnell Blanks, MD;  Location: [facility];  Service: Cardiovascular;  Laterality: N/A;   RECTAL BIOPSY N/A 05/04/2022   Procedure: BIOPSY RECTAL VS EXCISION OF PERIANAL LESION UNDER ANOSCOPY;  Surgeon: Ileana Roup, MD;  Location: [facility];  Service: General;  Laterality: N/A;   TUBAL LIGATION     Social History   Socioeconomic History   Marital status: Divorced    Spouse name: Not on file   Number of children: 3   Years of education: Not on file   Highest education level: Not on file  Occupational History   Occupation: retired  Tobacco Use   Smoking status: Never   Smokeless tobacco: Never  Vaping Use   Vaping Use: Never used  Substance and Sexual Activity   Alcohol use: No   Drug use: No   Sexual activity: Yes  Other Topics Concern   Not on file  Social History Narrative   Not on file   Social Determinants of Health   Financial Resource Strain: Not on file  Food Insecurity: Not on file  Transportation Needs: Not on  file  Physical Activity: Not on file  Stress: Not on file  Social Connections: Not on file   Family History  Problem Relation Age of Onset   CAD Mother 77   Heart attack Mother    Heart attack Father 3   CAD Brother 65   Skin cancer Brother    Other Son        gluten sensitivity   Colon cancer Neg Hx    Allergies  Allergen Reactions   Codeine Nausea And Vomiting   Prior to Admission medications   Medication Sig Start Date End Date Taking? Authorizing Provider  acetaminophen (TYLENOL) 500 MG tablet Take 1,000 mg by mouth every 6 (six) hours as needed for moderate pain.   Yes [provider]  ammonium lactate (LAC-HYDRIN) 12 % lotion Apply 1 Application topically daily.   Yes [provider]  apixaban (ELIQUIS) 2.5 MG TABS tablet Take one tab po bid x 4 weeks post-op to prevent blood clots 10/14/22   Aundra Dubin, PA-C  atorvastatin (LIPITOR) 40 MG tablet Take 1 tablet (40 mg total) by mouth daily. 02/13/22 02/14/23 Yes Swinyer, Lanice Schwab, NP  ezetimibe (ZETIA) 10 MG tablet Take 1 tablet (10 mg total) by  mouth daily. 03/03/22  Yes Turner, Eber Hong, MD  fluticasone (FLONASE) 50 MCG/ACT nasal spray Place 2 sprays into both nostrils daily. Patient taking differently: Place 2 sprays into both nostrils daily as needed for allergies. 11/26/17  Yes Yu, Amy V, PA-C  losartan (COZAAR) 25 MG tablet Take 25 mg by mouth daily. 09/17/22  Yes [provider]  nitroGLYCERIN (NITROSTAT) 0.4 MG SL tablet Place 1 tablet (0.4 mg total) under the tongue every 5 (five) minutes as needed for chest pain. 01/12/22 10/12/22 Yes Swinyer, Lanice Schwab, NP  PARoxetine (PAXIL) 20 MG tablet Take 10 mg by mouth daily.   Yes [provider]  traMADol (ULTRAM) 50 MG tablet Take 50 mg by mouth every 6 (six) hours as needed for moderate pain.   Yes [provider]  triamterene-hydrochlorothiazide (MAXZIDE-25) 37.5-25 MG tablet Take 1 tablet by mouth daily.   Yes [provider]  docusate sodium (COLACE) 100 MG capsule Take 1 capsule (100 mg total) by mouth daily as needed. 10/12/22 10/12/23  Aundra Dubin, PA-C  enoxaparin (LOVENOX) 30 MG/0.3ML injection Inject 0.3 mLs (30 mg total) into the skin every 12 (twelve) hours. To be used after surgery to prevent blood clots 10/14/22 11/13/22  Aundra Dubin, PA-C  methocarbamol (ROBAXIN-750) 750 MG tablet Take 1 tablet (750 mg total) by mouth 2 (two) times daily as needed. 10/12/22   Aundra Dubin, PA-C  ondansetron (ZOFRAN) 4 MG tablet Take 1 tablet (4 mg total) by mouth every 8 (eight) hours as needed for nausea or vomiting. 10/12/22   Aundra Dubin, PA-C  ondansetron (ZOFRAN-ODT) 4 MG disintegrating tablet Take 1 tablet (4 mg total) by mouth every 8 (eight) hours as needed for nausea or vomiting. 10/15/22   Aundra Dubin, PA-C  oxyCODONE-acetaminophen (PERCOCET) 5-325 MG tablet Take 1-2 tablets by mouth every 6 (six) hours as needed. To be taken after surgery 10/12/22   Aundra Dubin, PA-C     Positive ROS: All other systems have been reviewed and were otherwise negative with the exception of those mentioned in the HPI and as above.  Physical Exam: General: Alert, no acute distress Cardiovascular: No pedal edema Respiratory: No cyanosis, no use of accessory musculature GI: abdomen soft Skin: No lesions in the area of chief complaint Neurologic: Sensation intact distally Psychiatric: Patient is competent for consent with normal mood and affect Lymphatic: no lymphedema  MUSCULOSKELETAL: exam stable  Assessment: LEFT KNEE OSTEOARTHRITIS  Plan: Plan for Procedure(s): LEFT TOTAL KNEE ARTHROPLASTY  The risks benefits and alternatives were discussed with the patient including but not limited to the risks of nonoperative treatment, versus surgical intervention including infection, bleeding, nerve injury,  blood clots, cardiopulmonary complications, morbidity, mortality, among others, and they  were willing to proceed.   Eduard Roux, MD 10/19/2022 5:44 AM

## 2022-10-19 NOTE — Anesthesia Procedure Notes (Signed)
Anesthesia Regional Block: Adductor canal block   Pre-Anesthetic Checklist: , timeout performed,  Correct Patient, Correct Site, Correct Laterality,  Correct Procedure, Correct Position, site marked,  Risks and benefits discussed,  Surgical consent,  Pre-op evaluation,  At surgeon's request and post-op pain management  Laterality: Left  Prep: chloraprep       Needles:  Injection technique: Single-shot  Needle Type: Echogenic Needle     Needle Length: 9cm  Needle Gauge: 21     Additional Needles:   Procedures:,,,, ultrasound used (permanent image in chart),,    Narrative:  Start time: 10/19/2022 6:50 AM End time: 10/19/2022 6:57 AM Injection made incrementally with aspirations every 5 mL.  Performed by: Personally  Anesthesiologist: Santa Lighter, MD  Additional Notes: No pain on injection. No increased resistance to injection. Injection made in 5cc increments.  Good needle visualization.  Patient tolerated procedure well.

## 2022-10-19 NOTE — Anesthesia Procedure Notes (Signed)
Spinal  Patient location during procedure: OR Start time: 10/19/2022 7:25 AM End time: 10/19/2022 7:28 AM Reason for block: surgical anesthesia Staffing Performed: anesthesiologist  Anesthesiologist: Santa Lighter, MD Performed by: Santa Lighter, MD Authorized by: Santa Lighter, MD   Preanesthetic Checklist Completed: patient identified, IV checked, risks and benefits discussed, surgical consent, monitors and equipment checked, pre-op evaluation and timeout performed Spinal Block Patient position: sitting Prep: DuraPrep and site prepped and draped Patient monitoring: continuous pulse ox and blood pressure Approach: midline Location: L3-4 Injection technique: single-shot Needle Needle type: Pencan  Needle gauge: 24 G Assessment Events: CSF return Additional Notes Functioning IV was confirmed and monitors were applied. Sterile prep and drape, including hand hygiene, mask and sterile gloves were used. The patient was positioned and the spine was prepped. The skin was anesthetized with lidocaine.  Free flow of clear CSF was obtained prior to injecting local anesthetic into the CSF.  The spinal needle aspirated freely following injection.  The needle was carefully withdrawn.  The patient tolerated the procedure well. Consent was obtained prior to procedure with all questions answered and concerns addressed. Risks including but not limited to bleeding, infection, nerve damage, paralysis, failed block, inadequate analgesia, allergic reaction, high spinal, itching and headache were discussed and the patient wished to proceed.   Hoy Morn, MD

## 2022-10-20 ENCOUNTER — Encounter (HOSPITAL_COMMUNITY): Payer: Self-pay | Admitting: Orthopaedic Surgery

## 2022-10-20 DIAGNOSIS — M1712 Unilateral primary osteoarthritis, left knee: Secondary | ICD-10-CM | POA: Diagnosis not present

## 2022-10-20 LAB — CBC
HCT: 29.7 % — ABNORMAL LOW (ref 36.0–46.0)
Hemoglobin: 10.3 g/dL — ABNORMAL LOW (ref 12.0–15.0)
MCH: 30.4 pg (ref 26.0–34.0)
MCHC: 34.7 g/dL (ref 30.0–36.0)
MCV: 87.6 fL (ref 80.0–100.0)
Platelets: 148 10*3/uL — ABNORMAL LOW (ref 150–400)
RBC: 3.39 MIL/uL — ABNORMAL LOW (ref 3.87–5.11)
RDW: 12.1 % (ref 11.5–15.5)
WBC: 7.9 10*3/uL (ref 4.0–10.5)
nRBC: 0 % (ref 0.0–0.2)

## 2022-10-20 MED ORDER — BUPIVACAINE IN DEXTROSE 0.75-8.25 % IT SOLN
INTRATHECAL | Status: DC | PRN
  Administered 2022-10-19: 1.6 mL via INTRATHECAL

## 2022-10-20 NOTE — TOC Progression Note (Signed)
Transition of Care [facility]) - Progression Note    Patient Details  Name: Andrea [name] MRN: [id number] Date of Birth: July 29, 1956  Transition of Care [facility]) CM/SW Contact  Sharin Mons, RN Phone Number: 10/20/2022, 11:49 AM  Clinical Narrative:     Pt with rollator @ home. PT recommendations: RW. Pt agrees. Referral made with [employer] for RW. Equipment will be delivered to bedside prior to d/c.  Expected Discharge Plan: [place]    Expected Discharge Plan and Services   Discharge Planning Services: CM Consult Post Acute Care Choice: Durable Medical Equipment, Home Health Living arrangements for the past 2 months: Single Family Home Expected Discharge Date: 10/20/22               DME Arranged: Gilford Rile rolling DME Agency: [employer] Date DME Agency Contacted: 10/20/22 Time DME Agency Contacted: [id number] Representative spoke with at DME Agency: Erasmo Downer     Date [place]: 10/20/22 Time [place]: 1937 Representative spoke with at [place]: [place] Determinants of Health ([place]) Interventions SDOH Screenings   Food Insecurity: No Food Insecurity (10/20/2022)  Housing: Low Risk  (10/19/2022)  Transportation Needs: No Transportation Needs (10/20/2022)  Utilities: Not At Risk (10/20/2022)  Tobacco Use: Low Risk  (10/19/2022)    Readmission Risk Interventions     No data to display

## 2022-10-20 NOTE — Anesthesia Postprocedure Evaluation (Signed)
Anesthesia Post Note  Patient: Andrea [name]  Procedure(s) Performed: LEFT TOTAL KNEE ARTHROPLASTY (Left: Knee)     Patient location during evaluation: PACU Anesthesia Type: Spinal Level of consciousness: awake, awake and alert and oriented Pain management: pain level controlled Vital Signs Assessment: post-procedure vital signs reviewed and stable Respiratory status: spontaneous breathing, nonlabored ventilation and respiratory function stable Cardiovascular status: blood pressure returned to baseline and stable Postop Assessment: no headache, no backache, spinal receding and no apparent nausea or vomiting Anesthetic complications: no   No notable events documented.  Last Vitals:  Vitals:   10/19/22 1538 10/19/22 2007  BP: (!) 142/71 (!) 142/70  Pulse: 68 75  Resp: 18 18  Temp: 36.6 C 36.5 C  SpO2: 97% 97%    Last Pain:  Vitals:   10/19/22 2107  TempSrc:   PainSc: [place]

## 2022-10-20 NOTE — Plan of Care (Signed)

## 2022-10-20 NOTE — TOC Initial Note (Signed)
Transition of Care [facility]) - Initial/Assessment Note    Patient Details  Name: Andrea [name] MRN: [id number] Date of Birth: Jun 03, 1956  Transition of Care [facility]) CM/SW Contact:    Carles Collet, RN Phone Number: 10/20/2022, 8:19 AM  Clinical Narrative:                  Damaris Schooner w patient over the phone. Patient is an ortho bundle and HH was set up in the office prior to admission through [place]. Hospital liaison for [place] notified of admission and potential DC today. DME set up from the office, patient will have RW BSC, CPM machine for home. See note from [place] 10/16/22.   Expected Discharge Plan: [place]     Patient Goals and CMS Choice Patient states their goals for this hospitalization and ongoing recovery are:: to return home          Expected Discharge Plan and Services   Discharge Planning Services: CM Consult Post Acute Care Choice: Durable Medical Equipment, Home Health Living arrangements for the past 2 months: Single Family Home                               Date [place]: 10/20/22 Time HH Agency Contacted: [id number] Representative spoke with at [place]: Claiborne Billings  Prior Living Arrangements/Services Living arrangements for the past 2 months: [place] with:: Relatives, Adult Children   Do you feel safe going back to the place where you live?: Yes               Activities of Daily Living Home Assistive Devices/Equipment: None ADL Screening (condition at time of admission) Patient's cognitive ability adequate to safely complete daily activities?: Yes Is the patient deaf or have difficulty hearing?: No Does the patient have difficulty seeing, even when wearing glasses/contacts?: No Does the patient have difficulty concentrating, remembering, or making decisions?: No Patient able to express need for assistance with ADLs?: Yes Does the patient have difficulty dressing or bathing?: No Independently  performs ADLs?: Yes (appropriate for developmental age) Does the patient have difficulty walking or climbing stairs?: Yes Weakness of Legs: Left Weakness of Arms/Hands: None  Permission Sought/Granted                  Emotional Assessment              Admission diagnosis:  Status post total left knee replacement [Z96.652] Patient Active Problem List   Diagnosis Date Noted   Status post total left knee replacement 10/19/2022   Primary osteoarthritis of left knee 09/30/2022   Chest pain of uncertain etiology    Ulcerative colitis ([place]) 07/06/2019   PUD (peptic ulcer disease) 07/06/2019   PCP:  Kristen Loader, FNP Pharmacy:   [facility] [id number], [place] [address] Phone: [phone] Fax: [place], [place] [place] [place] [address] Phone: [phone] Fax: [phone]     Social Determinants of Health (SDOH) Social History: SDOH Screenings   Food Insecurity: No Food Insecurity (10/20/2022)  Housing: Low Risk  (10/19/2022)  Transportation Needs: No Transportation Needs (10/20/2022)  Utilities: Not At Risk (10/20/2022)  Tobacco Use: Low Risk  (10/19/2022)   SDOH Interventions:     Readmission Risk Interventions     No  data to display           

## 2022-10-20 NOTE — Progress Notes (Signed)
   Subjective:  Patient reports pain as mild.  Walked 20 ft with PT yesterday.  Objective:   VITALS:   Vitals:   10/19/22 1515 10/19/22 1538 10/19/22 2007 10/20/22 0737  BP:  (!) 142/71 (!) 142/70 128/60  Pulse:  68 75 77  Resp:  18 18   Temp: 97.7 F (36.5 C) 97.9 F (36.6 C) 97.7 F (36.5 C) 97.7 F (36.5 C)  TempSrc:   Oral Oral  SpO2: 93% 97% 97% 95%  Weight:      Height:        Neurovascular intact Sensation intact distally Intact pulses distally Dorsiflexion/Plantar flexion intact Incision: dressing C/D/I and no drainage   Lab Results  Component Value Date   WBC 7.9 10/20/2022   HGB 10.3 (L) 10/20/2022   HCT 29.7 (L) 10/20/2022   MCV 87.6 10/20/2022   PLT 148 (L) 10/20/2022     Assessment/Plan:  1 Day Post-Op   - Expected postop acute blood loss anemia - Up with PT/OT - anticipate will clear today - DVT ppx - SCDs, ambulation, lovenox - WBAT operative extremity - Pain control - Discharge planning - home after PT and voids  Andrea [name] 10/20/2022, 9:28 AM

## 2022-10-20 NOTE — Progress Notes (Signed)
Physical Therapy Treatment Patient Details Name: Andrea [name] MRN: [id number] DOB: [birth year] Today's Date: 10/20/2022   History of Present Illness 67 y.o. female presents to [facility] hospital on 10/19/2022 for elective L TKA. PMH includes OA, DOE, HTN, migraine, skin cancer, ulcerative colitis.    PT Comments    Pt received seated on BSC and agreeable to therapy session and with good participation and tolerance for transfer, gait and stair training. Pt son present and instructed on guarding for safety and pt given gait belt for safety navigating stairs/progressing gait distances. Pt given demo for use of RW vs rollator (pt borrowed rollator from a friend prior to surgery) and she agrees that rollator will not be proper device as she needs more stability that RW provides and case mgr notified. Pt with good use of RW for gait and was able to ascend/descend 3 STE using RW and son assisting her without rails per home setup. No LOB and VSS prior to session with no acute s/sx distress throughout. Anticipate pt safe to DC home from a functional mobility standpoint once medically cleared, will continue to follow acutely to progress strength/endurance and L knee ROM.  Recommendations for follow up therapy are one component of a multi-disciplinary discharge planning process, led by the attending physician.  Recommendations may be updated based on patient status, additional functional criteria and insurance authorization.  Follow Up Recommendations  Follow physician's recommendations for discharge plan and follow up therapies     Assistance Recommended at Discharge PRN  Patient can return home with the following A little help with bathing/dressing/bathroom;Assistance with cooking/housework;Help with stairs or ramp for entrance;Assist for transportation;A little help with walking and/or transfers   Equipment Recommendations  Rolling walker (2 wheels) (4WW not sufficient for her needs given new TKA and pt must  stand inside RW frame.)    Recommendations for Other Services       Precautions / Restrictions Precautions Precautions: Knee;Fall Precaution Booklet Issued: Yes (comment) Precaution Comments: reviewed with pt who indicated understanding, also written in HEP handout Restrictions Weight Bearing Restrictions: Yes LLE Weight Bearing: Weight bearing as tolerated     Mobility  Bed Mobility               General bed mobility comments: pt received up on BSC and in recliner at end of session. Pt reports no concerns.    Transfers Overall transfer level: Needs assistance Equipment used: Rolling walker (2 wheels) Transfers: Sit to/from Stand Sit to Stand: Min guard           General transfer comment: increased time, pt/son instructed on using gait belt to prevent posterior LOB/"plop" when sitting and pt needing reminder to keep painful LLE advanced prior to sitting/standing.    Ambulation/Gait Ambulation/Gait assistance: Min guard Gait Distance (Feet): 125 Feet (x2 with seated break prior to performing stairs) Assistive device: Rolling walker (2 wheels) Gait Pattern/deviations: Step-to pattern, Decreased step length - right, Decreased weight shift to left Gait velocity: reduced     General Gait Details: slowed step-to gait, reduced stride length. Pt initially having increased pain only using TDWB on LLE and advancing RLE first. Pt given instruction on proper sequencing with RW, then LLE, then RLE and pt reports pain much more manageable and able to start putting L foot down each step. Chair follow for safety given long distance to/from stairs and pt took single seated break halfway through.   Stairs Stairs: Yes Stairs assistance: Min assist, +2 safety/equipment, Min guard Stair Management:  Step to pattern, Backwards, Forwards, With walker, No rails Number of Stairs: 3 General stair comments: Pt performed 3 steps with cues for sequencing/RW use and good tolerance, son  present and able to demo back safe guarding position with gait belt as pt ascending/descending steps with RW as he has no rails at home entry.      Balance Overall balance assessment: Needs assistance Sitting-balance support: No upper extremity supported, Feet supported Sitting balance-Leahy Scale: Normal     Standing balance support: Bilateral upper extremity supported, Reliant on assistive device for balance Standing balance-Leahy Scale: Poor Standing balance comment: reliant on RW due to pain with dynamic standing tasks                            Cognition Arousal/Alertness: Awake/alert Behavior During Therapy: WFL for tasks assessed/performed Overall Cognitive Status: Within Functional Limits for tasks assessed                               Problem Solving: Requires verbal cues General Comments: Improved processing and carryover of info this session, pt son present also as he will provide assist to her upon DC and both receptive to instruction/able to demo back guarding and safe mobility with initial cues and teach-back        Exercises Total Joint Exercises Ankle Circles/Pumps: AROM, Both, 10 reps, Supine Quad Sets: AROM, Both, 5 reps, Supine Heel Slides: AROM, Left, Supine (a few reps for teachback) Hip ABduction/ADduction:  (verbal review) Straight Leg Raises: Left, AROM, Supine (a couple reps for teachback, no significant quad lag) Long Arc Quad: AROM, Left, Seated, 5 reps (prior to standing to ensure good quad contraction/strength prior to standing) Goniometric ROM: L knee flexion ROM grossly 5 deg to 75 deg    General Comments General comments (skin integrity, edema, etc.): no acute s/sx distress or lightheadedness reported.      Pertinent Vitals/Pain Pain Assessment Pain Assessment: Faces Faces Pain Scale: Hurts even more Pain Location: L knee Pain Descriptors / Indicators: Aching, Grimacing, Operative site guarding Pain  Intervention(s): Monitored during session, Premedicated before session, Repositioned, Patient requesting pain meds-RN notified, Ice applied     PT Goals (current goals can now be found in the care plan section) Acute Rehab PT Goals Patient Stated Goal: to return to independence PT Goal Formulation: With patient Time For Goal Achievement: 10/24/22 Progress towards PT goals: Progressing toward goals    Frequency    7X/week      PT Plan Current plan remains appropriate       AM-PAC PT "6 Clicks" Mobility   Outcome Measure  Help needed turning from your back to your side while in a flat bed without using bedrails?: None Help needed moving from lying on your back to sitting on the side of a flat bed without using bedrails?: A Little Help needed moving to and from a bed to a chair (including a wheelchair)?: A Little Help needed standing up from a chair using your arms (e.g., wheelchair or bedside chair)?: A Little Help needed to walk in hospital room?: A Little Help needed climbing 3-5 steps with a railing? : A Little 6 Click Score: 19    End of Session Equipment Utilized During Treatment: Gait belt Activity Tolerance: Patient tolerated treatment well Patient left: with call bell/phone within reach;with family/visitor present;in chair (son in room, OT entering room) Nurse Communication: Mobility  status;Patient requests pain meds;Other (comment) (pt needs RW DME for home) PT Visit Diagnosis: Other abnormalities of gait and mobility (R26.89);Muscle weakness (generalized) (M62.81);Pain Pain - Right/Left: Left Pain - part of body: Knee     Time: 8343-7357 PT Time Calculation (min) (ACUTE ONLY): 45 min  Charges:  $Gait Training: 23-37 mins $Therapeutic Activity: 8-22 mins                     Tana Trefry P., PTA Acute Rehabilitation Services Secure Chat Preferred 9a-5:30pm Office: [place] 10/20/2022, 1:20 PM

## 2022-10-20 NOTE — Care Management Obs Status (Signed)
[facility] NOTIFICATION   Patient Details  Name: Andrea [name] MRN: [id number] Date of Birth: Jan 26, 1956   Medicare Observation Status Notification Given:  Yes    Carles Collet, RN 10/20/2022, 8:15 AM

## 2022-10-20 NOTE — Discharge Summary (Signed)
Patient ID: Andrea [name] MRN: [id number] DOB/AGE: [id number] y.o.  Admit date: 10/19/2022 Discharge date: 10/20/2022  Admission Diagnoses:  Primary osteoarthritis of left knee  Discharge Diagnoses:  Principal Problem:   Primary osteoarthritis of left knee Active Problems:   Status post total left knee replacement   Past Medical History:  Diagnosis Date   Anemia    Anginal pain (HCC)    Arthritis    Chest discomfort    Disorder of immune system (HCC)    Dyspnea    Dyspnea on exertion    Hypercholesterolemia    Hypertension    Migraine    Non-ulcer dyspepsia    Osteopenia of spine    Peptic ulcer disease    Pneumonia    Polyp of rectum    cancerous   Pre-diabetes    Skin cancer    Ulcerative colitis ([place])     Surgeries: Procedure(s): LEFT TOTAL KNEE ARTHROPLASTY on 10/19/2022   Consultants (if any):   Discharged Condition: Improved  Hospital Course: Andrea [name] is an 67 y.o. female who was admitted 10/19/2022 with a diagnosis of Primary osteoarthritis of left knee and went to the operating room on 10/19/2022 and underwent the above named procedures.    She was given perioperative antibiotics:  Anti-infectives (From admission, onward)    Start     Dose/Rate Route Frequency Ordered Stop   10/19/22 1400  ceFAZolin (ANCEF) IVPB 2g/100 mL premix        2 g 200 mL/hr over 30 Minutes Intravenous Every 6 hours 10/19/22 1300 10/19/22 2100   10/19/22 1331  ceFAZolin (ANCEF) 2-4 GM/100ML-% IVPB       Note to Pharmacy: Jake Bathe M: cabinet override      10/19/22 1331 10/20/22 0144   10/19/22 0848  vancomycin (VANCOCIN) powder  Status:  Discontinued          As needed 10/19/22 0848 10/19/22 0943   10/19/22 0600  ceFAZolin (ANCEF) IVPB 2g/100 mL premix        2 g 200 mL/hr over 30 Minutes Intravenous On call to O.R. 10/19/22 0554 10/19/22 0735   10/19/22 0600  ceFAZolin (ANCEF) 2-4 GM/100ML-% IVPB       Note to Pharmacy: Demetrios Isaacs A: cabinet  override      10/19/22 0600 10/19/22 0737     .  She was given sequential compression devices, early ambulation, and appropriate chemoprophylaxis for DVT prophylaxis.  She benefited maximally from the hospital stay and there were no complications.    Recent vital signs:  Vitals:   10/19/22 2007 10/20/22 0737  BP: (!) 142/70 128/60  Pulse: 75 77  Resp: 18   Temp: 97.7 F (36.5 C) 97.7 F (36.5 C)  SpO2: 97% 95%    Recent laboratory studies:  Lab Results  Component Value Date   HGB 10.3 (L) 10/20/2022   HGB 11.9 (L) 10/19/2022   HGB 11.7 (L) 10/14/2022   Lab Results  Component Value Date   WBC 7.9 10/20/2022   PLT 148 (L) 10/20/2022   No results found for: "INR" Lab Results  Component Value Date   NA 139 10/14/2022   K 3.9 10/14/2022   CL 105 10/14/2022   CO2 24 10/14/2022   BUN 19 10/14/2022   CREATININE 0.98 10/19/2022   GLUCOSE 103 (H) 10/14/2022    Discharge Medications:   Allergies as of 10/20/2022       Reactions   Codeine Nausea And Vomiting  Medication List     TAKE these medications    acetaminophen 500 MG tablet Commonly known as: TYLENOL Take 1,000 mg by mouth every 6 (six) hours as needed for moderate pain.   ammonium lactate 12 % lotion Commonly known as: LAC-HYDRIN Apply 1 Application topically daily.   apixaban 2.5 MG Tabs tablet Commonly known as: Eliquis Take one tab po bid x 4 weeks post-op to prevent blood clots   atorvastatin 40 MG tablet Commonly known as: LIPITOR Take 1 tablet (40 mg total) by mouth daily.   docusate sodium 100 MG capsule Commonly known as: Colace Take 1 capsule (100 mg total) by mouth daily as needed.   enoxaparin 30 MG/0.3ML injection Commonly known as: Lovenox Inject 0.3 mLs (30 mg total) into the skin every 12 (twelve) hours. To be used after surgery to prevent blood clots   ezetimibe 10 MG tablet Commonly known as: ZETIA Take 1 tablet (10 mg total) by mouth daily.   fluticasone 50  MCG/ACT nasal spray Commonly known as: FLONASE Place 2 sprays into both nostrils daily. What changed:  when to take this reasons to take this   losartan 25 MG tablet Commonly known as: COZAAR Take 25 mg by mouth daily.   methocarbamol 750 MG tablet Commonly known as: Robaxin-750 Take 1 tablet (750 mg total) by mouth 2 (two) times daily as needed.   nitroGLYCERIN 0.4 MG SL tablet Commonly known as: NITROSTAT Place 1 tablet (0.4 mg total) under the tongue every 5 (five) minutes as needed for chest pain.   ondansetron 4 MG disintegrating tablet Commonly known as: ZOFRAN-ODT Take 1 tablet (4 mg total) by mouth every 8 (eight) hours as needed for nausea or vomiting.   ondansetron 4 MG tablet Commonly known as: Zofran Take 1 tablet (4 mg total) by mouth every 8 (eight) hours as needed for nausea or vomiting.   oxyCODONE-acetaminophen 5-325 MG tablet Commonly known as: Percocet Take 1-2 tablets by mouth every 6 (six) hours as needed. To be taken after surgery   PARoxetine 20 MG tablet Commonly known as: PAXIL Take 10 mg by mouth daily.   traMADol 50 MG tablet Commonly known as: ULTRAM Take 50 mg by mouth every 6 (six) hours as needed for moderate pain.   triamterene-hydrochlorothiazide 37.5-25 MG tablet Commonly known as: MAXZIDE-25 Take 1 tablet by mouth daily.               Durable Medical Equipment  (From admission, onward)           Start     Ordered   10/19/22 1300  DME Walker rolling  Once       Question Answer Comment  Walker: With 5 Inch Wheels   Patient needs a walker to treat with the following condition Status post left partial knee replacement      10/19/22 1259   10/19/22 1300  DME 3 n 1  Once        10/19/22 1259   10/19/22 1300  DME Bedside commode  Once       Question:  Patient needs a bedside commode to treat with the following condition  Answer:  Status post left partial knee replacement   10/19/22 1259            Diagnostic  Studies: DG Knee Left Port  Result Date: 10/19/2022 CLINICAL DATA:  Status post left knee arthroplasty EXAM: PORTABLE LEFT KNEE - 2 VIEW COMPARISON:  Left knee radiographs dated 08/07/2021 FINDINGS: Postsurgical changes  from left knee arthroplasty. Hardware components appear well seated and intact. Expected postsurgical subcutaneous emphysema, joint effusion, and soft tissue swelling. Intramedullary vertically oriented lucency within the distal femur may also reflect postsurgical change. IMPRESSION: Postsurgical changes from left knee arthroplasty. Electronically Signed   By: Darrin Nipper M.D.   On: 10/19/2022 10:12    Disposition: Discharge disposition: 01-Home or Self Care       Discharge Instructions     Call MD / Call 911   Complete by: As directed    If you experience chest pain or shortness of breath, CALL 911 and be transported to the hospital emergency room.  If you develope a fever above 101.5 F, pus (white drainage) or increased drainage or redness at the wound, or calf pain, call your surgeon's office.   Constipation Prevention   Complete by: As directed    Drink plenty of fluids.  Prune juice may be helpful.  You may use a stool softener, such as Colace (over the counter) 100 mg twice a day.  Use MiraLax (over the counter) for constipation as needed.   Driving restrictions   Complete by: As directed    No driving while taking narcotic pain meds.   Increase activity slowly as tolerated   Complete by: As directed    Post-operative opioid taper instructions:   Complete by: As directed    POST-OPERATIVE OPIOID TAPER INSTRUCTIONS: It is important to wean off of your opioid medication as soon as possible. If you do not need pain medication after your surgery it is ok to stop day one. Opioids include: Codeine, Hydrocodone(Norco, Vicodin), Oxycodone(Percocet, oxycontin) and hydromorphone amongst others.  Long term and even short term use of opiods can cause: Increased pain  response Dependence Constipation Depression Respiratory depression And more.  Withdrawal symptoms can include Flu like symptoms Nausea, vomiting And more Techniques to manage these symptoms Hydrate well Eat regular healthy meals Stay active Use relaxation techniques(deep breathing, meditating, yoga) Do Not substitute Alcohol to help with tapering If you have been on opioids for less than two weeks and do not have pain than it is ok to stop all together.  Plan to wean off of opioids This plan should start within one week post op of your joint replacement. Maintain the same interval or time between taking each dose and first decrease the dose.  Cut the total daily intake of opioids by one tablet each day Next start to increase the time between doses. The last dose that should be eliminated is the evening dose.           Follow-up Information     Leandrew Koyanagi, MD Follow up in 2 week(s).   Specialty: Orthopedic Surgery Why: For suture removal, For wound re-check Contact information: [place] [address] [phone]         Kristen Loader, FNP Follow up.   Specialty: Family Medicine Contact information: [place] [address] [place], [place] Follow up.   Specialty: [place] Why: [facility] will provide you with you home health PT services Contact information: [address] [place] [address] [phone]                  Signed: Eduard Roux 10/20/2022, 9:29 AM

## 2022-10-20 NOTE — Evaluation (Signed)
Occupational Therapy Evaluation and Discharge Patient Details Name: Andrea [name] MRN: [id number] DOB: July 25, 1956 Today's Date: 10/20/2022   History of Present Illness 67 y.o. female presents to [facility] hospital on 10/19/2022 for elective L TKA. PMH includes OA, DOE, HTN, migraine, skin cancer, ulcerative colitis.   Clinical Impression   This 67 yo female admitted and underwent above presents to acute OT with all education completed with pt and son, no further OT needs, we will sign off.      Recommendations for follow up therapy are one component of a multi-disciplinary discharge planning process, led by the attending physician.  Recommendations may be updated based on patient status, additional functional criteria and insurance authorization.   Follow Up Recommendations  No OT follow up     Assistance Recommended at Discharge PRN  Patient can return home with the following A little help with bathing/dressing/bathroom;Assistance with cooking/housework;Assist for transportation;Help with stairs or ramp for entrance    Functional Status Assessment  Patient has had a recent decline in their functional status and demonstrates the ability to make significant improvements in function in a reasonable and predictable amount of time. (without further skilled OT needs, all education completed)  Equipment Recommendations  None recommended by OT       Precautions / Restrictions Precautions Precautions: Knee;Fall Restrictions Weight Bearing Restrictions: No LLE Weight Bearing: Weight bearing as tolerated             ADL either performed or assessed with clinical judgement   ADL Overall ADL's : Needs assistance/impaired Eating/Feeding: Independent;Sitting   Grooming: Set up;Sitting   Upper Body Bathing: Set up;Sitting       Upper Body Dressing : Set up;Sitting     Lower Body Dressing Details (indicate cue type and reason): Educated on the sequence of dressing that is most efficient  (UB, LB--underwear, pants, shoes, shoes)           Tub/Shower Transfer Details (indicate cue type and reason): Lengthy discussion about options for gettting into tub shower combo. Pt and family report the did a "dry run" before coming in by backing up and stepping in with good leg, pivoting around to sit on shower seat with back and then swinging "operated" leg in. We discussed 2 other options to try as a "dry run" once home. First, backing up to seat and sitting as far back on sit as possible with family to support walker and/or shower seat do neither tip, then swinging both legs around individually to get them into tub. Second turn the seat  so back is to faucet (since has a hand held shower won't matter so long as someone else can turn on and adjust water for her), side step good leg in, sit down, then swing operated leg in. Offered to take her to the gym to practice with her politely declining since she feels they will be able to figure it out at home.         Vision Baseline Vision/History: 1 Wears glasses Ability to See in Adequate Light: 0 Adequate Patient Visual Report: No change from baseline              Pertinent Vitals/Pain Pain Assessment Pain Assessment: Faces Faces Pain Scale: Hurts even more Pain Location: L knee Pain Descriptors / Indicators: Aching, Grimacing, Operative site guarding Pain Intervention(s): Limited activity within patient's tolerance, Monitored during session, Repositioned     Hand Dominance Right   Extremity/Trunk Assessment Upper Extremity Assessment Upper Extremity Assessment: Overall [facility]  for tasks assessed           Communication Communication Communication: No difficulties   Cognition Arousal/Alertness: Awake/alert Behavior During Therapy: WFL for tasks assessed/performed Overall Cognitive Status: Within Functional Limits for tasks assessed                                                  Home Living  Family/patient expects to be discharged to:: Private residence Living Arrangements: Children;Other relatives Available Help at Discharge: Family;Available 24 hours/day Type of Home: House Home Access: Stairs to enter [employer] of Steps: 3 Entrance Stairs-Rails: None Home Layout: One level     Bathroom Shower/Tub: Teacher, early years/pre: Standard     Home Equipment: Rollator (4 wheels);Cane - single point;Toilet riser;Shower seat          Prior Functioning/Environment Prior Level of Function : Independent/Modified Independent;Driving             Mobility Comments: enjoys pickelball, ambulating with cane recently          OT Problem List: Decreased range of motion;Impaired balance (sitting and/or standing);Pain         OT Goals(Current goals can be found in the care plan section) Acute Rehab OT Goals Patient Stated Goal: to go home today         AM-PAC OT "6 Clicks" Daily Activity     Outcome Measure Help from another person eating meals?: None Help from another person taking care of personal grooming?: A Little Help from another person toileting, which includes using toliet, bedpan, or urinal?: A Little Help from another person bathing (including washing, rinsing, drying)?: A Little Help from another person to put on and taking off regular upper body clothing?: A Little Help from another person to put on and taking off regular lower body clothing?: A Little 6 Click Score: 19   End of Session    Activity Tolerance: Patient tolerated treatment well Patient left: in chair;with call bell/phone within reach;with family/visitor present  OT Visit Diagnosis: Other abnormalities of gait and mobility (R26.89);Pain;Muscle weakness (generalized) (M62.81) Pain - Right/Left: Left Pain - part of body: Knee                Time: 6789-3810 OT Time Calculation (min): 13 min Charges:  OT General Charges $OT Visit: 1 Visit OT Evaluation $OT Eval Low  Complexity: 1 Low  [place] [address], OTR/L Acute Rehab Services [facility] [place] [phone] Office [phone]    Almon Register 10/20/2022, 6:35 PM

## 2022-10-21 ENCOUNTER — Telehealth: Payer: Self-pay | Admitting: *Deleted

## 2022-10-21 NOTE — Telephone Encounter (Signed)
Ortho bundle D/C call completed. 

## 2022-10-23 ENCOUNTER — Other Ambulatory Visit: Payer: Self-pay | Admitting: Physician Assistant

## 2022-10-23 ENCOUNTER — Telehealth: Payer: Self-pay | Admitting: *Deleted

## 2022-10-23 MED ORDER — OXYCODONE-ACETAMINOPHEN 5-325 MG PO TABS: 1.0000 | ORAL_TABLET | Freq: Four times a day (QID) | ORAL | 0 refills | Status: DC | PRN

## 2022-10-23 NOTE — Telephone Encounter (Signed)
I previously sent this to [place], but she's not in office. Could you refill this pain medication for [facility] [place] She is S/P total knee on Monday, 10/19/22 and will be out over the weekend. Thank you.

## 2022-10-23 NOTE — Telephone Encounter (Signed)
Patient sent message requesting refill of pain medication. [facility] [place] due to staying with her son and Daughter in [place]. Thanks.

## 2022-10-26 ENCOUNTER — Other Ambulatory Visit: Payer: Self-pay | Admitting: Physician Assistant

## 2022-10-26 ENCOUNTER — Ambulatory Visit (HOSPITAL_COMMUNITY)
Admission: RE | Admit: 2022-10-26 | Discharge: 2022-10-26 | Disposition: A | Discharge status: Home / Self Care | Payer: Medicare Other | Source: Ambulatory Visit | Attending: Orthopaedic Surgery | Admitting: Orthopaedic Surgery

## 2022-10-26 ENCOUNTER — Telehealth: Payer: Self-pay | Admitting: *Deleted

## 2022-10-26 ENCOUNTER — Telehealth: Payer: Self-pay | Admitting: Radiology

## 2022-10-26 ENCOUNTER — Other Ambulatory Visit: Payer: Self-pay

## 2022-10-26 ENCOUNTER — Encounter: Payer: Self-pay | Admitting: Orthopaedic Surgery

## 2022-10-26 DIAGNOSIS — M1712 Unilateral primary osteoarthritis, left knee: Secondary | ICD-10-CM | POA: Diagnosis present

## 2022-10-26 DIAGNOSIS — M7989 Other specified soft tissue disorders: Secondary | ICD-10-CM | POA: Diagnosis present

## 2022-10-26 DIAGNOSIS — M79662 Pain in left lower leg: Secondary | ICD-10-CM

## 2022-10-26 MED ORDER — GABAPENTIN 100 MG PO CAPS: 100.0000 mg | ORAL_CAPSULE | Freq: Two times a day (BID) | ORAL | 2 refills | Status: DC | PRN

## 2022-10-26 NOTE — Telephone Encounter (Signed)
Andrea [name] has addressed the questions.  Can we get an ultrasound LLE to r/o dvt?

## 2022-10-26 NOTE — Telephone Encounter (Signed)
Ok, thanks.

## 2022-10-26 NOTE — Telephone Encounter (Signed)
Called patient back. [facility] called patient to schedule as I was talking with her.

## 2022-10-26 NOTE — Telephone Encounter (Signed)
I just saw that mychart message. What all did you answer/tell them?   Lauren, can we order urgent u/s LLE to r/o dvt?

## 2022-10-26 NOTE — Telephone Encounter (Signed)
Patient called in stating Lauren called her and I advised patient of note in chart that Lauren wanted to make her aware that we have ordered an ultrasound to r/o DVT as precaution. advised patient Ander Purpura will call her back being she had more questions I could not answer

## 2022-10-26 NOTE — Telephone Encounter (Signed)
Ordered u/s urgent. Tried to call patient. No answer. LMOM for her to call me as soon as she gets the message. I wanted to make her aware that we have ordered an ultrasound to r/o DVT as precaution.

## 2022-10-26 NOTE — Telephone Encounter (Signed)
[facility] called and they just completed Andrea [name]'s Left lower extremity Duplex and she was Negative for DVT.

## 2022-10-26 NOTE — Telephone Encounter (Signed)
Ok, sounds good!  Thanks you.  Just wanted to make sure I did not tell her anything contradicting what you said!

## 2022-10-26 NOTE — Telephone Encounter (Signed)
I see where you received a Mychart message regarding this patient. They have called me as well. I think I've answered some of the positional questions along with CPM and therapy. She is having a lot of pain in back of knee, not getting much rest and pain is now described as burning. She is taking medication 2 tablets every 6 hours pretty scheduled. Anything else you think that could help the burning or just keep going? Thank you.[facility] since she is staying at son and daughter in laws house.

## 2022-10-26 NOTE — Progress Notes (Signed)
Left lower extremity venous duplex has been completed. Preliminary results can be found in [facility] Proc through chart review.  Results were given to [facility] at Dr. Phoebe Sharps office.  10/26/22 2:10 PM Andrea [name] RVT

## 2022-10-28 NOTE — Telephone Encounter (Signed)
Looks like ultrasound was done

## 2022-10-28 NOTE — Telephone Encounter (Signed)
Yes, all of this has been taken care of.  Ultrasound done and was negative.  I started her on gabapentin yesterday I believe it was

## 2022-10-29 ENCOUNTER — Telehealth: Payer: Self-pay | Admitting: *Deleted

## 2022-10-29 MED ORDER — OXYCODONE-ACETAMINOPHEN 5-325 MG PO TABS: 1.0000 | ORAL_TABLET | Freq: Two times a day (BID) | ORAL | 0 refills | Status: DC | PRN

## 2022-10-29 MED ORDER — ENOXAPARIN SODIUM 30 MG/0.3ML IJ SOSY: 30.0000 mg | PREFILLED_SYRINGE | Freq: Two times a day (BID) | INTRAMUSCULAR | 0 refills | Status: DC

## 2022-10-29 NOTE — Telephone Encounter (Signed)
Patient called requesting refill of pain medication as well as Lovenox. She is not quite 2 weeks out. She is staying with family in [place], so she's asked for [facility] [place]. I am concerned about the Lovenox, b/c she has a single dose injectable that only gives 0.3 ml at a time every 12 hours-if she's taking correctly (she assured me she is), and pharmacy said they gave her a total of 18 ml, which would last 30 days. She says she only has 1 dose left and asked if you wanted her to continue for the full 30 days. Thank you.

## 2022-10-30 ENCOUNTER — Other Ambulatory Visit: Payer: Self-pay | Admitting: Physician Assistant

## 2022-10-30 MED ORDER — ENOXAPARIN SODIUM 30 MG/0.3ML IJ SOSY: 30.0000 mg | PREFILLED_SYRINGE | Freq: Two times a day (BID) | INTRAMUSCULAR | 0 refills | Status: DC

## 2022-11-03 ENCOUNTER — Ambulatory Visit (INDEPENDENT_AMBULATORY_CARE_PROVIDER_SITE_OTHER): Payer: Medicare Other | Admitting: Orthopaedic Surgery

## 2022-11-03 ENCOUNTER — Encounter: Payer: Self-pay | Admitting: Orthopaedic Surgery

## 2022-11-03 DIAGNOSIS — Z96652 Presence of left artificial knee joint: Secondary | ICD-10-CM

## 2022-11-03 MED ORDER — ENOXAPARIN SODIUM 30 MG/0.3ML IJ SOSY: 30.0000 mg | PREFILLED_SYRINGE | Freq: Two times a day (BID) | INTRAMUSCULAR | 0 refills | Status: DC

## 2022-11-03 NOTE — Progress Notes (Signed)
Post-Op Visit Note   Patient: Andrea [name]           Date of Birth: Jun 10, 1956           MRN: [id number] Visit Date: 11/03/2022 PCP: Kristen Loader, FNP   Assessment & Plan:  Chief Complaint:  Chief Complaint  Patient presents with   Left Knee - Follow-up    Left total knee arthroplasty 10/19/2022   Visit Diagnoses:  1. Status post total left knee replacement     Plan: [name] is 2 weeks status post left total knee replacement.  She is overall doing well and completed home health PT yesterday.  She is taking oxycodone and requests a refill today.  Her range of motion is progressing.  There was a shortage of Lovenox at [facility] therefore she was not able to get the full 2-week supply.  Examination of the left knee shows healed surgical incision without any signs of infection or drainage.  Expected postoperative swelling and appearance.  Negative Homans' sign and no calf tenderness.  Range of motion is progressing nicely.  Oxycodone and Lovenox were both refilled.  She has [facility]  Instruction sheet, handicap placard, implant card provided.  Questions encouraged and answered.  Recheck in 4 weeks with two-view x-rays of the left knee.  Follow-Up Instructions: Return in about 4 weeks (around 12/01/2022).   Orders:  No orders of the defined types were placed in this encounter.  Meds ordered this encounter  Medications   enoxaparin (LOVENOX) 30 MG/0.3ML injection    Sig: Inject 0.3 mLs (30 mg total) into the skin every 12 (twelve) hours for 14 days. To be used after surgery to prevent blood clots    Dispense:  8.4 mL    Refill:  0    Imaging: No results found.  PMFS History: Patient Active Problem List   Diagnosis Date Noted   Status post total left knee replacement 10/19/2022   Primary osteoarthritis of left knee 09/30/2022   Chest pain of uncertain etiology    Ulcerative colitis ([place]) 07/06/2019   PUD (peptic ulcer disease) 07/06/2019    Past Medical History:  Diagnosis Date   Anemia    Anginal pain (HCC)    Arthritis    Chest discomfort    Disorder of immune system (HCC)    Dyspnea    Dyspnea on exertion    Hypercholesterolemia    Hypertension    Migraine    Non-ulcer dyspepsia    Osteopenia of spine    Peptic ulcer disease    Pneumonia    Polyp of rectum    cancerous   Pre-diabetes    Skin cancer    Ulcerative colitis ([place])     Family History  Problem Relation Age of Onset   CAD Mother 75   Heart attack Mother    Heart attack Father 57   CAD Brother 35   Skin cancer Brother    Other Son        gluten sensitivity   Colon cancer Neg Hx     Past Surgical History:  Procedure Laterality Date   ANKLE FRACTURE SURGERY Left    CARDIAC CATHETERIZATION     COLONOSCOPY     ESOPHAGOGASTRODUODENOSCOPY     hip replacement Bilateral    LEFT HEART CATH AND CORONARY ANGIOGRAPHY N/A 01/21/2022   Procedure: LEFT HEART CATH AND CORONARY ANGIOGRAPHY;  Surgeon: Burnell Blanks, MD;  Location: [facility];  Service: Cardiovascular;  Laterality: N/A;   RECTAL BIOPSY N/A 05/04/2022   Procedure: BIOPSY RECTAL VS EXCISION OF PERIANAL LESION UNDER ANOSCOPY;  Surgeon: Ileana Roup, MD;  Location: [facility];  Service: General;  Laterality: N/A;   TOTAL KNEE ARTHROPLASTY Left 10/19/2022   Procedure: LEFT TOTAL KNEE ARTHROPLASTY;  Surgeon: Leandrew Koyanagi, MD;  Location: [facility];  Service: Orthopedics;  Laterality: Left;   TUBAL LIGATION     Social History   Occupational History   Occupation: retired  Tobacco Use   Smoking status: Never   Smokeless tobacco: Never  Vaping Use   Vaping Use: Never used  Substance and Sexual Activity   Alcohol use: No   Drug use: No   Sexual activity: Yes

## 2022-11-04 ENCOUNTER — Telehealth: Payer: Self-pay | Admitting: *Deleted

## 2022-11-04 ENCOUNTER — Other Ambulatory Visit: Payer: Self-pay | Admitting: Physician Assistant

## 2022-11-04 MED ORDER — OXYCODONE-ACETAMINOPHEN 5-325 MG PO TABS: 1.0000 | ORAL_TABLET | ORAL | 0 refills | Status: DC | PRN

## 2022-11-04 NOTE — Telephone Encounter (Signed)
Agree with reaching out to pcp in regards to bp.  As far as pain meds, I just sent in for 1 every 4 hours prn.  Not sure if she will have trouble getting since she is not due to get a refill for 4 more days

## 2022-11-04 NOTE — Telephone Encounter (Signed)
Ortho bundle 14 day in office meeting completed. °

## 2022-11-04 NOTE — Telephone Encounter (Signed)
Patient called and asked about her BP medications. Her BP has been running lower than usual. Last reading today was 105/58. I asked her to reach out to her PCP for this. She also requested a refill of her pain medication. Dr. Phoebe Sharps note yesterday mentioned he refilled, but he just refilled the Lovenox. He also wrote for it last time as 1-2 tablets twice daily. She has just started OPPT today and is still having quite a bit of swelling and pain. She asked about 1 tablet every 4 hours if needed. I don't know how insurance feels about that, but is there any way to adjust for her? She has been taking 1-2 every 6 hours as that is what was originally prescribed and she didn't notice the change with last Rx. [facility]. Thank you.

## 2022-11-05 NOTE — Telephone Encounter (Signed)
Ok, thanks.

## 2022-11-13 ENCOUNTER — Other Ambulatory Visit: Payer: Self-pay | Admitting: Physician Assistant

## 2022-11-13 ENCOUNTER — Telehealth: Payer: Self-pay | Admitting: *Deleted

## 2022-11-13 MED ORDER — GABAPENTIN 100 MG PO CAPS: 100.0000 mg | ORAL_CAPSULE | Freq: Two times a day (BID) | ORAL | 2 refills | Status: DC | PRN

## 2022-11-13 MED ORDER — OXYCODONE-ACETAMINOPHEN 5-325 MG PO TABS: 1.0000 | ORAL_TABLET | Freq: Three times a day (TID) | ORAL | 0 refills | Status: DC | PRN

## 2022-11-13 MED ORDER — GABAPENTIN 100 MG PO CAPS: 100.0000 mg | ORAL_CAPSULE | Freq: Three times a day (TID) | ORAL | 2 refills | Status: DC

## 2022-11-13 MED ORDER — METHOCARBAMOL 750 MG PO TABS: 750.0000 mg | ORAL_TABLET | Freq: Two times a day (BID) | ORAL | 2 refills | Status: DC | PRN

## 2022-11-13 NOTE — Telephone Encounter (Signed)
Spoke with patient who is requesting refill of pain medication and muscle relaxer. She also said the burning pain is extreme around the knee. She is already on Gabapentin 100 mg bid- any other recommendations or could it be increased to tid? Pharmacy is [facility] (she's moved back to her house from her son's). Thanks.

## 2022-11-13 NOTE — Telephone Encounter (Signed)
thanks

## 2022-11-13 NOTE — Telephone Encounter (Signed)
Sent in pain meds (tid), robaxin and gabapentin (tid)

## 2022-11-17 ENCOUNTER — Encounter: Payer: Self-pay | Admitting: Gastroenterology

## 2022-12-01 ENCOUNTER — Telehealth: Payer: Self-pay | Admitting: *Deleted

## 2022-12-01 ENCOUNTER — Ambulatory Visit (INDEPENDENT_AMBULATORY_CARE_PROVIDER_SITE_OTHER): Payer: Medicare Other

## 2022-12-01 ENCOUNTER — Ambulatory Visit (INDEPENDENT_AMBULATORY_CARE_PROVIDER_SITE_OTHER): Payer: Medicare Other | Admitting: Physician Assistant

## 2022-12-01 DIAGNOSIS — Z96652 Presence of left artificial knee joint: Secondary | ICD-10-CM

## 2022-12-01 MED ORDER — GABAPENTIN 100 MG PO CAPS: 100.0000 mg | ORAL_CAPSULE | Freq: Three times a day (TID) | ORAL | 2 refills | Status: DC

## 2022-12-01 NOTE — Progress Notes (Signed)
Post-Op Visit Note   Patient: Andrea [name]           Date of Birth: 1956-05-13           MRN: [id number] Visit Date: 12/01/2022 PCP: Kristen Loader, FNP   Assessment & Plan:  Chief Complaint:  Chief Complaint  Patient presents with   Left Knee - Pain   Visit Diagnoses:  1. Status post total left knee replacement     Plan: Patient is a pleasant 67 year old female who comes in today 6 weeks status post left total knee replacement 10/19/2022.  She has been doing okay.  She has been in outpatient physical therapy making good progress.  She is ambulating with a single-point cane.  She is taking Tylenol for pain.  She is still noting burning sensations around her knee and is taking gabapentin but only once at night.  She has finished her Lovenox for which she was taking for DVT prophylaxis.  Examination of the left knee reveals a fully healed surgical scar without complication.  Range of motion 0 to 120 degrees.  She is stable to valgus and varus stress.  She is neurovascular intact distally.  At this point, she will continue with PT.  Dental prophylaxis reinforced.  Follow-up in 6 weeks for repeat evaluation.  Call with concerns or questions.  Follow-Up Instructions: Return in about 6 weeks (around 01/12/2023).   Orders:  Orders Placed This Encounter  Procedures   XR Knee 1-2 Views Left   Meds ordered this encounter  Medications   gabapentin (NEURONTIN) 100 MG capsule    Sig: Take 1 capsule (100 mg total) by mouth 3 (three) times daily.    Dispense:  60 capsule    Refill:  2    Imaging: XR Knee 1-2 Views Left  Result Date: 12/01/2022 Well-seated prosthesis without complication   PMFS History: Patient Active Problem List   Diagnosis Date Noted   Status post total left knee replacement 10/19/2022   Primary osteoarthritis of left knee 09/30/2022   Chest pain of uncertain etiology    Ulcerative colitis ([place]) 07/06/2019   PUD (peptic ulcer disease) 07/06/2019   Past  Medical History:  Diagnosis Date   Anemia    Anginal pain (HCC)    Arthritis    Chest discomfort    Disorder of immune system (HCC)    Dyspnea    Dyspnea on exertion    Hypercholesterolemia    Hypertension    Migraine    Non-ulcer dyspepsia    Osteopenia of spine    Peptic ulcer disease    Pneumonia    Polyp of rectum    cancerous   Pre-diabetes    Skin cancer    Ulcerative colitis ([place])     Family History  Problem Relation Age of Onset   CAD Mother 6   Heart attack Mother    Heart attack Father 71   CAD Brother 35   Skin cancer Brother    Other Son        gluten sensitivity   Colon cancer Neg Hx     Past Surgical History:  Procedure Laterality Date   ANKLE FRACTURE SURGERY Left    CARDIAC CATHETERIZATION     COLONOSCOPY     ESOPHAGOGASTRODUODENOSCOPY     hip replacement Bilateral    LEFT HEART CATH AND CORONARY ANGIOGRAPHY N/A 01/21/2022   Procedure: LEFT HEART CATH AND CORONARY ANGIOGRAPHY;  Surgeon: Burnell Blanks, MD;  Location: [facility];  Service: Cardiovascular;  Laterality: N/A;   RECTAL BIOPSY N/A 05/04/2022   Procedure: BIOPSY RECTAL VS EXCISION OF PERIANAL LESION UNDER ANOSCOPY;  Surgeon: Ileana Roup, MD;  Location: [facility];  Service: General;  Laterality: N/A;   TOTAL KNEE ARTHROPLASTY Left 10/19/2022   Procedure: LEFT TOTAL KNEE ARTHROPLASTY;  Surgeon: Leandrew Koyanagi, MD;  Location: [facility];  Service: Orthopedics;  Laterality: Left;   TUBAL LIGATION     Social History   Occupational History   Occupation: retired  Tobacco Use   Smoking status: Never   Smokeless tobacco: Never  Vaping Use   Vaping Use: Never used  Substance and Sexual Activity   Alcohol use: No   Drug use: No   Sexual activity: Yes

## 2022-12-01 NOTE — Telephone Encounter (Signed)
Ortho bundle 30 day in person meeting completed. 

## 2022-12-09 ENCOUNTER — Ambulatory Visit (INDEPENDENT_AMBULATORY_CARE_PROVIDER_SITE_OTHER): Payer: Medicare Other

## 2022-12-09 VITALS — BP 127/65 | HR 78 | Temp 97.7°F | Resp 16 | Ht 65.0 in | Wt 165.8 lb

## 2022-12-09 DIAGNOSIS — K5120 Ulcerative (chronic) proctitis without complications: Secondary | ICD-10-CM | POA: Diagnosis not present

## 2022-12-09 MED ORDER — VEDOLIZUMAB 300 MG IV SOLR
300.0000 mg | Freq: Once | INTRAVENOUS | Status: AC
  Administered 2022-12-09: 300 mg via INTRAVENOUS
  Filled 2022-12-09: qty 5

## 2022-12-09 NOTE — Progress Notes (Signed)
Diagnosis: Crohn's Disease  Provider:  Marshell Garfinkel MD  Procedure: Infusion  IV Type: Peripheral, IV Location: R Hand  Entyvio (Vedolizumab), Dose: 300 mg  Infusion Start Time: [id number]  Infusion Stop Time: 1350  Post Infusion IV Care: Peripheral IV Discontinued  Discharge: Condition: Good, Destination: Home . AVS Declined  Performed by:  Adelina Mings, LPN

## 2022-12-21 ENCOUNTER — Other Ambulatory Visit: Payer: Self-pay | Admitting: Family Medicine

## 2022-12-21 DIAGNOSIS — Z0000 Encounter for general adult medical examination without abnormal findings: Secondary | ICD-10-CM

## 2022-12-23 ENCOUNTER — Ambulatory Visit
Admission: RE | Admit: 2022-12-23 | Discharge: 2022-12-23 | Disposition: A | Discharge status: Home / Self Care | Payer: Medicare Other | Source: Ambulatory Visit | Attending: Family Medicine | Admitting: Family Medicine

## 2022-12-23 DIAGNOSIS — Z0000 Encounter for general adult medical examination without abnormal findings: Secondary | ICD-10-CM

## 2023-01-10 NOTE — Progress Notes (Unsigned)
Office Visit Note   Patient: Andrea [name]           Date of Birth: September 29, 1955           MRN: [id number] Visit Date: 01/12/2023              Requested by: Soundra Pilon, FNP [address] [place],  [place] [address] PCP: Soundra Pilon, FNP   Assessment & Plan: Visit Diagnoses:  1. Status post total left knee replacement     Plan: She is still doing outpatient physical therapy.  She is almost to the goal of 120 degrees.  She does not feel any functional limitations with her knee.  Dental prophylaxis reinforced.  Recheck in 3 months with two-view x-rays of the left knee.  Follow-Up Instructions: Return in about 3 months (around 04/13/2023).   Orders:  No orders of the defined types were placed in this encounter.  No orders of the defined types were placed in this encounter.     Procedures: No procedures performed   Clinical Data: No additional findings.   Subjective: Chief Complaint  Patient presents with   Left Knee - Follow-up    Left total knee arthroplasty 10/19/2022    HPI  [name] is 3 months status post left total knee replacement.  She is doing well overall.  She has no real complaints.  Review of Systems   Objective: Vital Signs: There were no vitals taken for this visit.  Physical Exam  Ortho Exam  Examination left knee shows fully healed surgical scar.  Excellent range of motion.  Good varus valgus stability.  Specialty Comments:  No specialty comments available.  Imaging: No results found.   PMFS History: Patient Active Problem List   Diagnosis Date Noted   Status post total left knee replacement 10/19/2022   Primary osteoarthritis of left knee 09/30/2022   Chest pain of uncertain etiology    Ulcerative colitis 07/06/2019   PUD (peptic ulcer disease) 07/06/2019   Past Medical History:  Diagnosis Date   Anemia    Anginal pain    Arthritis    Chest discomfort    Disorder of immune system    Dyspnea    Dyspnea on exertion     Hypercholesterolemia    Hypertension    Migraine    Non-ulcer dyspepsia    Osteopenia of spine    Peptic ulcer disease    Pneumonia    Polyp of rectum    cancerous   Pre-diabetes    Skin cancer    Ulcerative colitis     Family History  Problem Relation Age of Onset   CAD Mother 25   Heart attack Mother    Heart attack Father 42   CAD Brother 55   Skin cancer Brother    Other Son        gluten sensitivity   Colon cancer Neg Hx     Past Surgical History:  Procedure Laterality Date   ANKLE FRACTURE SURGERY Left    CARDIAC CATHETERIZATION     COLONOSCOPY     ESOPHAGOGASTRODUODENOSCOPY     hip replacement Bilateral    LEFT HEART CATH AND CORONARY ANGIOGRAPHY N/A 01/21/2022   Procedure: LEFT HEART CATH AND CORONARY ANGIOGRAPHY;  Surgeon: Kathleene Hazel, MD;  Location: [facility];  Service: Cardiovascular;  Laterality: N/A;   RECTAL BIOPSY N/A 05/04/2022   Procedure: BIOPSY RECTAL VS EXCISION OF PERIANAL LESION UNDER ANOSCOPY;  Surgeon: Andria Meuse,  MD;  Location: [facility];  Service: General;  Laterality: N/A;   TOTAL KNEE ARTHROPLASTY Left 10/19/2022   Procedure: LEFT TOTAL KNEE ARTHROPLASTY;  Surgeon: Tarry Kos, MD;  Location: [facility];  Service: Orthopedics;  Laterality: Left;   TUBAL LIGATION     Social History   Occupational History   Occupation: retired  Tobacco Use   Smoking status: Never   Smokeless tobacco: Never  Vaping Use   Vaping Use: Never used  Substance and Sexual Activity   Alcohol use: No   Drug use: No   Sexual activity: Yes

## 2023-01-11 ENCOUNTER — Ambulatory Visit (INDEPENDENT_AMBULATORY_CARE_PROVIDER_SITE_OTHER): Payer: Medicare Other | Admitting: Gastroenterology

## 2023-01-11 ENCOUNTER — Encounter: Payer: Self-pay | Admitting: Gastroenterology

## 2023-01-11 VITALS — BP 122/60 | HR 77 | Ht 65.0 in | Wt 163.0 lb

## 2023-01-11 DIAGNOSIS — K625 Hemorrhage of anus and rectum: Secondary | ICD-10-CM

## 2023-01-11 DIAGNOSIS — Z7960 Long term (current) use of unspecified immunomodulators and immunosuppressants: Secondary | ICD-10-CM

## 2023-01-11 DIAGNOSIS — K5130 Ulcerative (chronic) rectosigmoiditis without complications: Secondary | ICD-10-CM | POA: Diagnosis not present

## 2023-01-11 DIAGNOSIS — Z85048 Personal history of other malignant neoplasm of rectum, rectosigmoid junction, and anus: Secondary | ICD-10-CM

## 2023-01-11 MED ORDER — NA SULFATE-K SULFATE-MG SULF 17.5-3.13-1.6 GM/177ML PO SOLN: 1.0000 | Freq: Once | ORAL | 0 refills | Status: AC

## 2023-01-11 NOTE — Progress Notes (Addendum)
[facility] Progress Note  Chief Complaint:  Chief Complaint  Patient presents with   Ulcerative Colitis    Pt states she is having a breakout around her anal area and has some concerns.   Subjective  History: Summary of GI issues: "Ulcerative pancolitis initially diagnosed 2005 and treated by Dr. Nadeen Landau at [facility], transferred care to me after moving to this area.  Was stable for years on infliximab and azathioprine. [facility] clinic visit spring 2023 for anal symptoms, abnormal tissue seen and referred to colorectal surgery, suspecting AIN.  EUA discovered early stage anal squamous carcinoma on a background of AIN 3 , resected for cure.  Saw Dr. Cliffton Asters at [facility] and postop follow-up November 2023. She return for consultation with Dr. Stevphen Rochester, who recommended changing her regimen because of the malignancy diagnosis, so she was started on Entyvio 09/01/2022."  Last Entyvio 300 mg IV infusion on 12/09/2022.  Sp[place]er underwent left TKR late January 2024.  Today, she reports feeling well overall but states that she experienced some itching around the anal area and and had noticed some blood on the tissue.. She reports having typically normal BMs. She denies any constipation, diarrhea, stool seepage (issue is resolved from previous visit). She states that she is tolerating her Entyvio infusions well.   She states that she has had been experiencing some arthritis pain since stopping the remicade.      Given that the spotting of blood and itching were reminiscent of the symptoms that brought her to [place] last year when the anal cancer was diagnosed, she is concerned there could be a recurrence.  ROS: Review of Systems  Constitutional:  Negative for appetite change and fever.  HENT:  Negative for trouble swallowing.   Respiratory:  Negative for cough and shortness of breath.   Cardiovascular:  Negative for chest pain.  Gastrointestinal:  Positive for anal bleeding, blood in stool and rectal  pain (itchiness). Negative for abdominal distention, abdominal pain, constipation, diarrhea, nausea and vomiting.  Genitourinary:  Negative for dysuria.  Musculoskeletal:  Negative for back pain.       +arthritis pain  Skin:  Negative for rash.  Neurological:  Negative for weakness.  All other systems reviewed and are negative.    The patient's Past Medical, Family and Social History were reviewed and are on file in the EMR.  Objective:  Med list reviewed  Current Outpatient Medications:    acetaminophen (TYLENOL) 500 MG tablet, Take 1,000 mg by mouth every 6 (six) hours as needed for moderate pain., Disp: , Rfl:    ammonium lactate (LAC-HYDRIN) 12 % lotion, Apply 1 Application topically daily., Disp: , Rfl:    atorvastatin (LIPITOR) 40 MG tablet, Take 1 tablet (40 mg total) by mouth daily., Disp: 90 tablet, Rfl: 3   fluticasone (FLONASE) 50 MCG/ACT nasal spray, Place 2 sprays into both nostrils daily. (Patient taking differently: Place 2 sprays into both nostrils daily as needed for allergies.), Disp: 1 g, Rfl: 0   losartan (COZAAR) 25 MG tablet, Take 25 mg by mouth daily., Disp: , Rfl:    PARoxetine (PAXIL) 20 MG tablet, Take 10 mg by mouth daily., Disp: , Rfl:    triamterene-hydrochlorothiazide (MAXZIDE-25) 37.5-25 MG tablet, Take 1 tablet by mouth daily., Disp: , Rfl:    docusate sodium (COLACE) 100 MG capsule, Take 1 capsule (100 mg total) by mouth daily as needed. (Patient not taking: Reported on 01/11/2023), Disp: 30 capsule, Rfl: 2   enoxaparin (LOVENOX)  30 MG/0.3ML injection, Inject 0.3 mLs (30 mg total) into the skin every 12 (twelve) hours for 14 days. To be used after surgery to prevent blood clots, Disp: 8.4 mL, Rfl: 0   ezetimibe (ZETIA) 10 MG tablet, Take 1 tablet (10 mg total) by mouth daily. (Patient not taking: Reported on 01/11/2023), Disp: 90 tablet, Rfl: 3   gabapentin (NEURONTIN) 100 MG capsule, Take 1 capsule (100 mg total) by mouth 3 (three) times daily. (Patient not  taking: Reported on 01/11/2023), Disp: 60 capsule, Rfl: 2   methocarbamol (ROBAXIN-750) 750 MG tablet, Take 1 tablet (750 mg total) by mouth 2 (two) times daily as needed. (Patient not taking: Reported on 01/11/2023), Disp: 20 tablet, Rfl: 2   nitroGLYCERIN (NITROSTAT) 0.4 MG SL tablet, Place 1 tablet (0.4 mg total) under the tongue every 5 (five) minutes as needed for chest pain., Disp: 25 tablet, Rfl: 2   ondansetron (ZOFRAN) 4 MG tablet, Take 1 tablet (4 mg total) by mouth every 8 (eight) hours as needed for nausea or vomiting. (Patient not taking: Reported on 01/11/2023), Disp: 40 tablet, Rfl: 0   ondansetron (ZOFRAN-ODT) 4 MG disintegrating tablet, Take 1 tablet (4 mg total) by mouth every 8 (eight) hours as needed for nausea or vomiting. (Patient not taking: Reported on 01/11/2023), Disp: 180 each, Rfl: 2   oxyCODONE-acetaminophen (PERCOCET) 5-325 MG tablet, Take 1 tablet by mouth every 8 (eight) hours as needed. To be taken after surgery (Patient not taking: Reported on 01/11/2023), Disp: 40 tablet, Rfl: 0   traMADol (ULTRAM) 50 MG tablet, Take 50 mg by mouth every 6 (six) hours as needed for moderate pain. (Patient not taking: Reported on 01/11/2023), Disp: , Rfl:    Vital signs in last 24 hrs: Vitals:   01/11/23 1049  BP: 122/60  Pulse: 77   Wt Readings from Last 3 Encounters:  01/11/23 163 lb (73.9 kg)  12/09/22 165 lb 12.8 oz (75.2 kg)  10/19/22 170 lb (77.1 kg)    Physical Exam General: well-appearing   Eyes: sclera anicteric, no redness ENT: oral mucosa moist without lesions, no cervical or supraclavicular lymphadenopathy CV: RRR, no JVD, no peripheral edema Resp: clear to auscultation bilaterally, normal RR and effort noted GI: soft, no tenderness, with active bowel sounds. No guarding or palpable organomegaly noted. Skin; warm and dry, no rash or jaundice noted Neuro: awake, alert and oriented x 3. Normal gross motor function and fluent speech Anal Exam: Perianal skin is  healthy, small amount of fecal material detected on the distal end of the anal canal, ridge like posterior anal tissue felt on DRE.  (Nontender)  Labs:     Latest Ref Rng & Units 10/20/2022    5:03 AM 10/19/2022    3:54 PM 10/14/2022    8:47 AM  CBC  WBC 4.0 - 10.5 K/uL 7.9  6.8  6.5   Hemoglobin 12.0 - 15.0 g/dL 16.1  09.6  04.5   Hematocrit 36.0 - 46.0 % 29.7  35.8  35.9   Platelets 150 - 400 K/uL 148  176  177    Most recent CBC was postop TKR ___________________________________________ Radiologic studies:   ____________________________________________ Other:   _____________________________________________ Assessment & Plan  Assessment:  Anal bleeding  History of anal cancer  Long-term use of immunosuppressant medication  Ulcerative rectosigmoiditis without complication   Anal/perianal symptoms.  Skin looks healthy in that area, small internal hemorrhoids seen within the anal canal, not inflamed or with stigmata of bleeding.  Palpable postsurgical findings  in the anal canal, seems likely expected after mucosal resection of an early malignancy. Generally, her colitis symptoms sound under good control Entyvio. Plan: -Colonoscopy to assess anal/rectal symptoms and for colorectal cancer screening on patient and increased risk with longstanding history of ulcerative colitis. She was agreeable after discussion of procedure and risks.  The benefits and risks of the planned procedure were described in detail with the patient or (when appropriate) their health care proxy.  Risks were outlined as including, but not limited to, bleeding, infection, perforation, adverse medication reaction leading to cardiac or pulmonary decompensation, pancreatitis (if ERCP).  The limitation of incomplete mucosal visualization was also discussed.  No guarantees or warranties were given.  If any uncertainty about abnormal tissue or possible recurrence of anal cancer at the completion of the  colonoscopy, I will certainly get her back into see Dr. Cliffton Asters.     Amada Jupiter, MD    [facility]  32 minutes were spent on this encounter (including chart review, history/exam, counseling/coordination of care, and documentation) > 50% of that time was spent on counseling and coordination of care.   I,Safa M Kadhim,acting as a scribe for Charlie Pitter III, MD.,have documented all relevant documentation on the behalf of Sherrilyn Rist, MD,as directed by  Sherrilyn Rist, MD while in the presence of Sherrilyn Rist, MD.   Marvis Repress III, MD, have reviewed all documentation for this visit. The documentation on 01/11/23 for the exam, diagnosis, procedures, and orders are all accurate and complete.

## 2023-01-11 NOTE — Progress Notes (Deleted)
[facility] Progress Note  Chief Complaint: ***  Subjective  History: Summary of GI issues: "Ulcerative pancolitis initially diagnosed 2005 and treated by Dr. Nadeen Landau at [facility] transferred care to me after moving to this area.  Was stable for years on infliximab and azathioprine. [facility] clinic visit spring 2023 for anal symptoms, abnormal tissue seen and referred to colorectal surgery, suspecting AIN.  EUA discovered early stage anal squamous carcinoma on a background of AIN 3 , resected for cure.  Saw Dr. Cliffton Asters at [facility] and postop follow-up November 2023. She return for consultation with Dr. Stevphen Rochester, who recommended changing her regimen because of the malignancy diagnosis, so she was started on Entyvio 09/01/2022."  Last Entyvio 300 mg IV infusion on 12/09/2022.  Last colonoscopy Dr. Stevphen Rochester at [facility] Oct 2021. __________________  Dois Davenport underwent left TKR late January 2024.  ROS: Cardiovascular:  no chest pain Respiratory: no dyspnea  The patient's Past Medical, Family and Social History were reviewed and are on file in the EMR.  Objective:  Med list reviewed  Current Outpatient Medications:    acetaminophen (TYLENOL) 500 MG tablet, Take 1,000 mg by mouth every 6 (six) hours as needed for moderate pain., Disp: , Rfl:    ammonium lactate (LAC-HYDRIN) 12 % lotion, Apply 1 Application topically daily., Disp: , Rfl:    atorvastatin (LIPITOR) 40 MG tablet, Take 1 tablet (40 mg total) by mouth daily., Disp: 90 tablet, Rfl: 3   docusate sodium (COLACE) 100 MG capsule, Take 1 capsule (100 mg total) by mouth daily as needed., Disp: 30 capsule, Rfl: 2   enoxaparin (LOVENOX) 30 MG/0.3ML injection, Inject 0.3 mLs (30 mg total) into the skin every 12 (twelve) hours for 14 days. To be used after surgery to prevent blood clots, Disp: 8.4 mL, Rfl: 0   ezetimibe (ZETIA) 10 MG tablet, Take 1 tablet (10 mg total) by mouth daily., Disp: 90 tablet, Rfl: 3   fluticasone (FLONASE) 50 MCG/ACT  nasal spray, Place 2 sprays into both nostrils daily. (Patient taking differently: Place 2 sprays into both nostrils daily as needed for allergies.), Disp: 1 g, Rfl: 0   gabapentin (NEURONTIN) 100 MG capsule, Take 1 capsule (100 mg total) by mouth 3 (three) times daily., Disp: 60 capsule, Rfl: 2   losartan (COZAAR) 25 MG tablet, Take 25 mg by mouth daily., Disp: , Rfl:    methocarbamol (ROBAXIN-750) 750 MG tablet, Take 1 tablet (750 mg total) by mouth 2 (two) times daily as needed., Disp: 20 tablet, Rfl: 2   nitroGLYCERIN (NITROSTAT) 0.4 MG SL tablet, Place 1 tablet (0.4 mg total) under the tongue every 5 (five) minutes as needed for chest pain., Disp: 25 tablet, Rfl: 2   ondansetron (ZOFRAN) 4 MG tablet, Take 1 tablet (4 mg total) by mouth every 8 (eight) hours as needed for nausea or vomiting., Disp: 40 tablet, Rfl: 0   ondansetron (ZOFRAN-ODT) 4 MG disintegrating tablet, Take 1 tablet (4 mg total) by mouth every 8 (eight) hours as needed for nausea or vomiting., Disp: 180 each, Rfl: 2   oxyCODONE-acetaminophen (PERCOCET) 5-325 MG tablet, Take 1 tablet by mouth every 8 (eight) hours as needed. To be taken after surgery, Disp: 40 tablet, Rfl: 0   PARoxetine (PAXIL) 20 MG tablet, Take 10 mg by mouth daily., Disp: , Rfl:    traMADol (ULTRAM) 50 MG tablet, Take 50 mg by mouth every 6 (six) hours as needed for moderate pain., Disp: , Rfl:  triamterene-hydrochlorothiazide (MAXZIDE-25) 37.5-25 MG tablet, Take 1 tablet by mouth daily., Disp: , Rfl:    Vital signs in last 24 hrs: There were no vitals filed for this visit. Wt Readings from Last 3 Encounters:  12/09/22 165 lb 12.8 oz (75.2 kg)  10/19/22 170 lb (77.1 kg)  10/14/22 168 lb (76.2 kg)    Physical Exam  *** HEENT: sclera anicteric, oral mucosa moist without lesions Neck: supple, no thyromegaly, JVD or lymphadenopathy Cardiac: ***,  no peripheral edema Pulm: clear to auscultation bilaterally, normal RR and effort noted Abdomen: soft,  *** tenderness, with active bowel sounds. No guarding or palpable hepatosplenomegaly. Skin; warm and dry, no jaundice or rash  Labs:     Latest Ref Rng & Units 10/20/2022    5:03 AM 10/19/2022    3:54 PM 10/14/2022    8:47 AM  CBC  WBC 4.0 - 10.5 K/uL 7.9  6.8  6.5   Hemoglobin 12.0 - 15.0 g/dL 16.1  09.6  04.5   Hematocrit 36.0 - 46.0 % 29.7  35.8  35.9   Platelets 150 - 400 K/uL 148  176  177    Most recent CBC was postop TKR ___________________________________________ Radiologic studies:   ____________________________________________ Other:   _____________________________________________ Assessment & Plan  Assessment: No diagnosis found.    Plan:   *** minutes were spent on this encounter (including chart review, history/exam, counseling/coordination of care, and documentation) > 50% of that time was spent on counseling and coordination of care.   Charlie Pitter III

## 2023-01-11 NOTE — Patient Instructions (Signed)
_______________________________________________________  If your blood pressure at your visit was 140/90 or greater, please contact your primary care physician to follow up on this.  _______________________________________________________  If you are age 68 or older, your body mass index should be between 23-30. Your Body mass index is 27.12 kg/m. If this is out of the aforementioned range listed, please consider follow up with your Primary Care Provider.  If you are age 49 or younger, your body mass index should be between 19-25. Your Body mass index is 27.12 kg/m. If this is out of the aformentioned range listed, please consider follow up with your Primary Care Provider.   ________________________________________________________  The [facility] providers would like to encourage you to use [facility] to communicate with providers for non-urgent requests or questions.  Due to long hold times on the telephone, sending your provider a message by [facility] may be a faster and more efficient way to get a response.  Please allow 48 business hours for a response.  Please remember that this is for non-urgent requests.  _______________________________________________________  Andrea [name] have been scheduled for a colonoscopy. Please follow written instructions given to you at your visit today.  Please pick up your prep supplies at the pharmacy within the next 1-3 days. If you use inhalers (even only as needed), please bring them with you on the day of your procedure.  Due to recent changes in healthcare laws, you may see the results of your imaging and laboratory studies on MyChart before your provider has had a chance to review them.  We understand that in some cases there may be results that are confusing or concerning to you. Not all laboratory results come back in the same time frame and the provider may be waiting for multiple results in order to interpret others.  Please give [place] 48 hours in order for your  provider to thoroughly review all the results before contacting the office for clarification of your results.   It was a pleasure to see you today!  Thank you for trusting me with your gastrointestinal care!

## 2023-01-12 ENCOUNTER — Other Ambulatory Visit: Payer: Self-pay

## 2023-01-12 ENCOUNTER — Ambulatory Visit (INDEPENDENT_AMBULATORY_CARE_PROVIDER_SITE_OTHER): Payer: Medicare Other | Admitting: Orthopaedic Surgery

## 2023-01-12 ENCOUNTER — Telehealth: Payer: Self-pay | Admitting: Gastroenterology

## 2023-01-12 DIAGNOSIS — Z96652 Presence of left artificial knee joint: Secondary | ICD-10-CM

## 2023-01-12 MED ORDER — NA SULFATE-K SULFATE-MG SULF 17.5-3.13-1.6 GM/177ML PO SOLN: 1.0000 | Freq: Once | ORAL | 0 refills | Status: AC

## 2023-01-12 NOTE — Telephone Encounter (Signed)
Rx sent to [facility] as requested

## 2023-01-12 NOTE — Telephone Encounter (Signed)
Inbound call from patient, would like prep medication sent to [facility] on [address] instead of [facility]. Please advise.

## 2023-01-21 ENCOUNTER — Ambulatory Visit (AMBULATORY_SURGERY_CENTER): Payer: Medicare Other | Admitting: Gastroenterology

## 2023-01-21 ENCOUNTER — Encounter: Payer: Self-pay | Admitting: Gastroenterology

## 2023-01-21 VITALS — BP 110/62 | HR 74 | Temp 98.2°F | Resp 18 | Ht 65.0 in | Wt 163.0 lb

## 2023-01-21 DIAGNOSIS — D123 Benign neoplasm of transverse colon: Secondary | ICD-10-CM

## 2023-01-21 DIAGNOSIS — K5100 Ulcerative (chronic) pancolitis without complications: Secondary | ICD-10-CM | POA: Diagnosis not present

## 2023-01-21 MED ORDER — SODIUM CHLORIDE 0.9 % IV SOLN
500.0000 mL | Freq: Once | INTRAVENOUS | Status: DC
Start: 2023-01-21 — End: 2023-01-21

## 2023-01-21 NOTE — Progress Notes (Signed)
No changes to clinical history since GI office visit on 01/11/23.  The patient is appropriate for an endoscopic procedure in the ambulatory setting.  - Amada Jupiter, MD

## 2023-01-21 NOTE — Patient Instructions (Addendum)
Handouts Provided:  Polyps and Diverticulosis  Continue present medications, including current Entyvio treatment regimen.  As needed use over-the-counter Preparation H ointment with hydrocortisone for local treatment of hemorrhoidal symptoms.  YOU HAD AN ENDOSCOPIC PROCEDURE TODAY AT THE [facility]:   Refer to the procedure report that was given to you for any specific questions about what was found during the examination.  If the procedure report does not answer your questions, please call your gastroenterologist to clarify.  If you requested that your care partner not be given the details of your procedure findings, then the procedure report has been included in a sealed envelope for you to review at your convenience later.  YOU SHOULD EXPECT: Some feelings of bloating in the abdomen. Passage of more gas than usual.  Walking can help get rid of the air that was put into your GI tract during the procedure and reduce the bloating. If you had a lower endoscopy (such as a colonoscopy or flexible sigmoidoscopy) you may notice spotting of blood in your stool or on the toilet paper. If you underwent a bowel prep for your procedure, you may not have a normal bowel movement for a few days.  Please Note:  You might notice some irritation and congestion in your nose or some drainage.  This is from the oxygen used during your procedure.  There is no need for concern and it should clear up in a day or so.  SYMPTOMS TO REPORT IMMEDIATELY:  Following lower endoscopy (colonoscopy or flexible sigmoidoscopy):  Excessive amounts of blood in the stool  Significant tenderness or worsening of abdominal pains  Swelling of the abdomen that is new, acute  Fever of 100F or higher  For urgent or emergent issues, a gastroenterologist can be reached at any hour by calling [phone]. Do not use MyChart messaging for urgent concerns.    DIET:  We do recommend a small meal at first, but then you may  proceed to your regular diet.  Drink plenty of fluids but you should avoid alcoholic beverages for 24 hours.  ACTIVITY:  You should plan to take it easy for the rest of today and you should NOT DRIVE or use heavy machinery until tomorrow (because of the sedation medicines used during the test).    FOLLOW UP: Our staff will call the number listed on your records the next business day following your procedure.  We will call around 7:15- 8:00 am to check on you and address any questions or concerns that you may have regarding the information given to you following your procedure. If we do not reach you, we will leave a message.     If any biopsies were taken you will be contacted by phone or by letter within the next 1-3 weeks.  Please call us at [phone] if you have not heard about the biopsies in 3 weeks.    SIGNATURES/CONFIDENTIALITY: You and/or your care partner have signed paperwork which will be entered into your electronic medical record.  These signatures attest to the fact that that the information above on your After Visit Summary has been reviewed and is understood.  Full responsibility of the confidentiality of this discharge information lies with you and/or your care-partner.

## 2023-01-21 NOTE — Progress Notes (Signed)
Uneventful anesthetic. Report to pacu rn. Vss. Care resumed by rn. 

## 2023-01-21 NOTE — Progress Notes (Signed)
Pt's states no medical or surgical changes since previsit or office visit. 

## 2023-01-21 NOTE — Op Note (Addendum)
[facility] Patient Name: Andrea [name] Procedure Date: 01/21/2023 7:54 AM MRN: [id number] Endoscopist: Sherilyn Cooter L. Myrtie Neither , MD, [id number] Age: 67 Referring MD:  Date of Birth: 11-26-1955 Gender: Female Account #: [id number] Procedure:                Colonoscopy Indications:              Anal bleeding, Disease activity assessment of                            chronic ulcerative pancolitis                           Clinical details and recent office note.                           Longstanding ulcerative colitis, previously under                            control on infliximab/AZA, changed to Entyvio last                            year after diagnosis of anal cancer                           Early stage anal cancer on background of AIN 3                            cured with local excision last year                           Patient with small-volume anal bleeding and itching Medicines:                Monitored Anesthesia Care Procedure:                Pre-Anesthesia Assessment:                           - Prior to the procedure, a History and Physical                            was performed, and patient medications and                            allergies were reviewed. The patient's tolerance of                            previous anesthesia was also reviewed. The risks                            and benefits of the procedure and the sedation                            options and risks were discussed with the patient.  All questions were answered, and informed consent                            was obtained. Prior Anticoagulants: The patient has                            taken no anticoagulant or antiplatelet agents. ASA                            Grade Assessment: II - A patient with mild systemic                            disease. After reviewing the risks and benefits,                            the patient was deemed in satisfactory condition  to                            undergo the procedure.                           After obtaining informed consent, the colonoscope                            was passed under direct vision. Throughout the                            procedure, the patient's blood pressure, pulse, and                            oxygen saturations were monitored continuously. The                            Olympus CF-HQ190L SN [id number] was introduced through                            the anus and advanced to the the terminal ileum,                            with identification of the appendiceal orifice and                            IC valve. The colonoscopy was performed without                            difficulty. The patient tolerated the procedure                            well. The quality of the bowel preparation was                            excellent. The terminal ileum, ileocecal valve,  appendiceal orifice, and rectum were photographed. Scope In: 8:12:52 AM Scope Out: 8:35:11 AM Scope Withdrawal Time: 0 hours 18 minutes 57 seconds  Total Procedure Duration: 0 hours 22 minutes 19 seconds  Findings:                 The perianal and digital rectal examinations were                            normal other than a small palpable ridge of                            fibrotic tissue in the posterior anal canal.                           The terminal ileum appeared normal.                           Normal mucosa was found in the entire colon.                            (entire mucosa examined under [facility] and [facility]) Several                            biopsies were obtained in the sigmoid colon, in the                            descending colon, in the transverse colon and in                            the ascending colon with cold forceps for histology.                           Two semi-sessile polyps were found in the                            transverse colon and hepatic flexure.  The polyps                            were diminutive in size. These polyps were removed                            with a cold snare. Resection and retrieval were                            complete.                           Diverticula were found in the entire colon.                           External and internal hemorrhoids were found (small                            and straddling anal verge including below dentate  line). The hemorrhoids were Grade I (internal                            hemorrhoids that do not prolapse). The anal mucosa                            showed a small superficial posterior scar from                            previous excision. Remainder anal mucosa appeared                            normal.                           The exam was otherwise without abnormality on                            direct and retroflexion views. Complications:            No immediate complications. Estimated Blood Loss:     Estimated blood loss was minimal. Impression:               - The examined portion of the ileum was normal.                           - Normal mucosa in the entire examined colon.                           - Two diminutive polyps in the transverse colon and                            at the hepatic flexure, removed with a cold snare.                            Resected and retrieved.                           - Diverticulosis in the entire examined colon.                           - External and internal hemorrhoids.                           - The examination was otherwise normal on direct                            and retroflexion views.                           - Several biopsies were obtained in the sigmoid                            colon, in the descending colon, in the transverse  colon and in the ascending colon.                           Colitis remains in complete mucosal  remission. Recommendation:           - Patient has a contact number available for                            emergencies. The signs and symptoms of potential                            delayed complications were discussed with the                            patient. Return to normal activities tomorrow.                            Written discharge instructions were provided to the                            patient.                           - Resume previous diet.                           - Continue present medications, including current                            Entyvio treatment regimen. Follow-up in my office                            in 6 months.                           - Await pathology results.                           - Repeat colonoscopy is recommended for                            surveillance. The colonoscopy date will be                            determined after pathology results from today's                            exam become available for review.                           - As needed use of over-the-counter Preparation H                            ointment with hydrocortisone for local treatment of  hemorrhoidal symptoms.                           Follow-up with colorectal surgery per their usual                            anal cancer surveillance protocol. (Reports will be                            forwarded to that provider) Sherilyn Cooter L. Myrtie Neither, MD 01/21/2023 8:49:31 AM This report has been signed electronically.

## 2023-01-21 NOTE — Progress Notes (Signed)
Called to room to assist during endoscopic procedure.  Patient ID and intended procedure confirmed with present staff. Received instructions for my participation in the procedure from the performing physician.  

## 2023-01-22 ENCOUNTER — Telehealth: Payer: Self-pay | Admitting: *Deleted

## 2023-01-22 NOTE — Telephone Encounter (Signed)
  Follow up Call-     01/21/2023    7:11 AM  Call back number  Post procedure Call Back phone  # [phone]  Permission to leave phone message Yes     Patient questions:  Do you have a fever, pain , or abdominal swelling? No. Pain Score  0 *  Have you tolerated food without any problems? Yes.    Have you been able to return to your normal activities? Yes.    Do you have any questions about your discharge instructions: Diet   No. Medications  No. Follow up visit  No.  Do you have questions or concerns about your Care? No.  Actions: * If pain score is 4 or above: No action needed, pain <4.

## 2023-01-27 ENCOUNTER — Encounter: Payer: Self-pay | Admitting: Gastroenterology

## 2023-02-03 ENCOUNTER — Ambulatory Visit (INDEPENDENT_AMBULATORY_CARE_PROVIDER_SITE_OTHER): Payer: Medicare Other

## 2023-02-03 VITALS — BP 135/72 | HR 75 | Temp 98.3°F | Resp 16 | Ht 65.0 in | Wt 162.4 lb

## 2023-02-03 DIAGNOSIS — K5120 Ulcerative (chronic) proctitis without complications: Secondary | ICD-10-CM

## 2023-02-03 MED ORDER — VEDOLIZUMAB 300 MG IV SOLR
300.0000 mg | Freq: Once | INTRAVENOUS | Status: AC
  Administered 2023-02-03: 300 mg via INTRAVENOUS
  Filled 2023-02-03: qty 5

## 2023-02-03 NOTE — Progress Notes (Signed)
Diagnosis: Crohn's Disease  Provider:  Chilton Greathouse MD  Procedure: IV Infusion  IV Type: Peripheral, IV Location: L Antecubital  Entyvio (Vedolizumab), Dose: 300 mg  Infusion Start Time: 0927  Infusion Stop Time: 1000  Post Infusion IV Care: Peripheral IV Discontinued  Discharge: Condition: Good, Destination: Home . AVS Declined  Performed by:  Loney Hering, LPN

## 2023-02-04 ENCOUNTER — Other Ambulatory Visit: Payer: Self-pay | Admitting: Nurse Practitioner

## 2023-03-31 ENCOUNTER — Ambulatory Visit (INDEPENDENT_AMBULATORY_CARE_PROVIDER_SITE_OTHER): Payer: Medicare Other

## 2023-03-31 VITALS — BP 118/58 | HR 76 | Temp 97.7°F | Resp 16 | Ht 65.0 in | Wt 162.8 lb

## 2023-03-31 DIAGNOSIS — K5120 Ulcerative (chronic) proctitis without complications: Secondary | ICD-10-CM | POA: Diagnosis not present

## 2023-03-31 MED ORDER — VEDOLIZUMAB 300 MG IV SOLR
300.0000 mg | Freq: Once | INTRAVENOUS | Status: AC
  Administered 2023-03-31: 300 mg via INTRAVENOUS
  Filled 2023-03-31: qty 5

## 2023-03-31 NOTE — Progress Notes (Signed)
Diagnosis: Ulcerative Colitis  Provider:  Chilton Greathouse MD  Procedure: IV Infusion  IV Type: Peripheral, IV Location: L Hand  Entyvio (Vedolizumab), Dose: 300 mg  Infusion Start Time: 0923  Infusion Stop Time: 0959  Post Infusion IV Care: Peripheral IV Discontinued  Discharge: Condition: Good, Destination: Home . AVS Declined  Performed by:  Loney Hering, LPN

## 2023-04-06 ENCOUNTER — Other Ambulatory Visit (INDEPENDENT_AMBULATORY_CARE_PROVIDER_SITE_OTHER): Payer: Medicare Other

## 2023-04-06 ENCOUNTER — Ambulatory Visit: Payer: Medicare Other | Admitting: Orthopaedic Surgery

## 2023-04-06 DIAGNOSIS — Z96652 Presence of left artificial knee joint: Secondary | ICD-10-CM

## 2023-04-06 NOTE — Progress Notes (Signed)
Office Visit Note   Patient: Andrea [name]           Date of Birth: [birth year]           MRN: [id number] Visit Date: 04/06/2023              Requested by: Soundra Pilon, FNP [address] [place],  [place] [address] PCP: Soundra Pilon, FNP   Assessment & Plan: Visit Diagnoses:  1. Status post total left knee replacement     Plan: Shawana is now 6 months status post left total knee replacement.  She is very happy with the outcome.  She reports no pain with walking.  Dental prophylaxis was reinforced.  Will recheck her in 6 months with two-view x-rays of the left knee.  Follow-Up Instructions: Return in about 6 months (around 10/07/2023).   Orders:  Orders Placed This Encounter  Procedures   XR Knee 1-2 Views Left   No orders of the defined types were placed in this encounter.     Procedures: No procedures performed   Clinical Data: No additional findings.   Subjective: Chief Complaint  Patient presents with   Left Knee - Pain    HPI Andrea [name] is here for 82-month postop check.  She has no complaints other than some numbness around the surgical scar. Review of Systems   Objective: Vital Signs: There were no vitals taken for this visit.  Physical Exam  Ortho Exam Shows fully healed surgical scar.  Range of motion of the left knee is normal without pain.  No signs of infection.  Normal gait. Specialty Comments:  No specialty comments available.  Imaging: XR Knee 1-2 Views Left  Result Date: 04/06/2023 Stable left total knee replacement in good alignment.     PMFS History: Patient Active Problem List   Diagnosis Date Noted   Status post total left knee replacement 10/19/2022   Primary osteoarthritis of left knee 09/30/2022   Chest pain of uncertain etiology    Ulcerative colitis (HCC) 07/06/2019   PUD (peptic ulcer disease) 07/06/2019   Past Medical History:  Diagnosis Date   Anemia    Anginal pain (HCC)    Arthritis    Chest discomfort     Disorder of immune system (HCC)    Dyspnea    Dyspnea on exertion    Hypercholesterolemia    Hypertension    Migraine    Non-ulcer dyspepsia    Osteopenia of spine    Peptic ulcer disease    Pneumonia    Polyp of rectum    cancerous   Pre-diabetes    Skin cancer    Ulcerative colitis (HCC)     Family History  Problem Relation Age of Onset   CAD Mother 63   Heart attack Mother    Heart attack Father 31   CAD Brother 64   Skin cancer Brother    Other Son        gluten sensitivity   Colon cancer Neg Hx    Esophageal cancer Neg Hx    Rectal cancer Neg Hx    Stomach cancer Neg Hx     Past Surgical History:  Procedure Laterality Date   ANKLE FRACTURE SURGERY Left    CARDIAC CATHETERIZATION     COLONOSCOPY     ESOPHAGOGASTRODUODENOSCOPY     hip replacement Bilateral    LEFT HEART CATH AND CORONARY ANGIOGRAPHY N/A 01/21/2022   Procedure: LEFT HEART CATH AND CORONARY ANGIOGRAPHY;  Surgeon: Clifton James,  Nile Dear, MD;  Location: [facility];  Service: Cardiovascular;  Laterality: N/A;   RECTAL BIOPSY N/A 05/04/2022   Procedure: BIOPSY RECTAL VS EXCISION OF PERIANAL LESION UNDER ANOSCOPY;  Surgeon: Andria Meuse, MD;  Location: [facility];  Service: General;  Laterality: N/A;   TOTAL KNEE ARTHROPLASTY Left 10/19/2022   Procedure: LEFT TOTAL KNEE ARTHROPLASTY;  Surgeon: Tarry Kos, MD;  Location: [facility] OR;  Service: Orthopedics;  Laterality: Left;   TUBAL LIGATION     Social History   Occupational History   Occupation: retired  Tobacco Use   Smoking status: Never   Smokeless tobacco: Never  Vaping Use   Vaping status: Never Used  Substance and Sexual Activity   Alcohol use: No   Drug use: No   Sexual activity: Yes

## 2023-04-13 ENCOUNTER — Other Ambulatory Visit: Payer: Self-pay | Admitting: Cardiology

## 2023-05-26 ENCOUNTER — Ambulatory Visit (INDEPENDENT_AMBULATORY_CARE_PROVIDER_SITE_OTHER): Payer: Medicare Other

## 2023-05-26 VITALS — BP 122/75 | HR 62 | Temp 97.7°F | Resp 16 | Ht 65.0 in | Wt 161.2 lb

## 2023-05-26 DIAGNOSIS — K5120 Ulcerative (chronic) proctitis without complications: Secondary | ICD-10-CM

## 2023-05-26 MED ORDER — VEDOLIZUMAB 300 MG IV SOLR
300.0000 mg | Freq: Once | INTRAVENOUS | Status: AC
  Administered 2023-05-26: 300 mg via INTRAVENOUS
  Filled 2023-05-26: qty 5

## 2023-05-26 NOTE — Progress Notes (Signed)
Diagnosis: Ulcerative Colitis  Provider:  Chilton Greathouse MD  Procedure: IV Infusion  IV Type: Peripheral, IV Location: R Hand  Entyvio (Vedolizumab), Dose: 300 mg  Infusion Start Time: 0950  Infusion Stop Time: 1030  Post Infusion IV Care: Peripheral IV Discontinued  Discharge: Condition: Good, Destination: Home . AVS Declined  Performed by:  Rico Ala, LPN

## 2023-06-20 ENCOUNTER — Ambulatory Visit (INDEPENDENT_AMBULATORY_CARE_PROVIDER_SITE_OTHER): Payer: Medicare Other

## 2023-06-20 ENCOUNTER — Ambulatory Visit (HOSPITAL_COMMUNITY)
Admission: EM | Admit: 2023-06-20 | Discharge: 2023-06-20 | Disposition: A | Discharge status: Home / Self Care | Payer: Medicare Other | Attending: Internal Medicine | Admitting: Internal Medicine

## 2023-06-20 ENCOUNTER — Encounter (HOSPITAL_COMMUNITY): Payer: Self-pay

## 2023-06-20 DIAGNOSIS — R051 Acute cough: Secondary | ICD-10-CM

## 2023-06-20 DIAGNOSIS — R0602 Shortness of breath: Secondary | ICD-10-CM

## 2023-06-20 DIAGNOSIS — R0789 Other chest pain: Secondary | ICD-10-CM

## 2023-06-20 MED ORDER — ALBUTEROL SULFATE HFA 108 (90 BASE) MCG/ACT IN AERS
1.0000 | INHALATION_SPRAY | RESPIRATORY_TRACT | 0 refills | Status: AC | PRN
Start: 2023-06-20 — End: ?

## 2023-06-20 MED ORDER — PREDNISONE 10 MG PO TABS: ORAL_TABLET | ORAL | 0 refills | Status: DC

## 2023-06-20 MED ORDER — AZITHROMYCIN 250 MG PO TABS: ORAL_TABLET | ORAL | 0 refills | Status: DC

## 2023-06-20 NOTE — ED Triage Notes (Signed)
Patient here today with c/o cough, SOB, and chest soreness on the left side. She had cold symptoms a few weeks ago and has been taking Augmentin and started to feel better but still had a lingering cough. Over the last 3 days she has been coughing up a small amount of green phlegm.

## 2023-06-20 NOTE — Discharge Instructions (Addendum)
You were seen today for cough, shortness of breath and chest tightness.  We have obtained an x-ray to rule out pneumonia and will you will be called only if the x-ray is abnormal.  If the x-ray shows pneumonia, we will change her antibiotic to azithromycin.  If your x-ray does not show pneumonia, we would recommend you finish out your Augmentin as previously prescribed.  I have sent in a round of steroids to reduce the inflammation in your lungs as well as a albuterol inhaler to help with your shortness of breath.  You can use a cough syrup such as Delsym or Robitussin OTC as needed.  I recommend you follow-up with your PCP if symptoms persist or worsen.

## 2023-06-20 NOTE — ED Provider Notes (Signed)
[facility]    CSN: [id number] Arrival date & time: 06/20/23  1310      History   Chief Complaint Chief Complaint  Patient presents with   Cough    HPI Andrea [name] is a 67 y.o. female with a history of arthritis, HLD, HTN, migraine and prediabetes presents to urgent care today with complaint of cough, chest tightness and shortness of breath.  She reports her initial symptoms started 2-3 weeks ago.  The cough is productive of green mucus.  She denies headache, runny nose, nasal congestion, ear pain, sore throat, nausea, vomiting or diarrhea.  She denies fever, chills or bodyaches.  She reports she had an appointment with her PCP but they canceled this appointment and sent her in Augmentin.  She reports some of her symptoms have improved but her cough, chest tightness and shortness of breath have worsened within the last 3 days.  She reports she has had pneumonia in the past and this feels the same.  She has tried Tylenol in addition to Augmentin with minimal relief of symptoms.  HPI  Past Medical History:  Diagnosis Date   Anemia    Anginal pain (HCC)    Arthritis    Chest discomfort    Disorder of immune system (HCC)    Dyspnea    Dyspnea on exertion    Hypercholesterolemia    Hypertension    Migraine    Non-ulcer dyspepsia    Osteopenia of spine    Peptic ulcer disease    Pneumonia    Polyp of rectum    cancerous   Pre-diabetes    Skin cancer    Ulcerative colitis [facility])     Patient Active Problem List   Diagnosis Date Noted   Status post total left knee replacement 10/19/2022   Primary osteoarthritis of left knee 09/30/2022   Chest pain of uncertain etiology    Ulcerative colitis (HCC) 07/06/2019   PUD (peptic ulcer disease) 07/06/2019    Past Surgical History:  Procedure Laterality Date   ANKLE FRACTURE SURGERY Left    CARDIAC CATHETERIZATION     COLONOSCOPY     ESOPHAGOGASTRODUODENOSCOPY     hip replacement Bilateral    LEFT HEART CATH AND  CORONARY ANGIOGRAPHY N/A 01/21/2022   Procedure: LEFT HEART CATH AND CORONARY ANGIOGRAPHY;  Surgeon: Kathleene Hazel, MD;  [facility];  Service: Cardiovascular;  Laterality: N/A;   RECTAL BIOPSY N/A 05/04/2022   Procedure: BIOPSY RECTAL VS EXCISION OF PERIANAL LESION UNDER ANOSCOPY;  Surgeon: Andria Meuse, MD;  Location: [facility];  Service: General;  Laterality: N/A;   TOTAL KNEE ARTHROPLASTY Left 10/19/2022   Procedure: LEFT TOTAL KNEE ARTHROPLASTY;  Surgeon: Tarry Kos, MD;  Location: [facility];  Service: Orthopedics;  Laterality: Left;   TUBAL LIGATION      OB History   No obstetric history on file.      Home Medications    Prior to Admission medications   Medication Sig Start Date End Date Taking? Authorizing Provider  albuterol (VENTOLIN HFA) 108 (90 Base) MCG/ACT inhaler Inhale 1-2 puffs into the lungs every 4 (four) hours as needed for wheezing or shortness of breath. 06/20/23  Yes Renie Stelmach, Salvadore Oxford, NP  predniSONE (DELTASONE) 10 MG tablet Take 6 tabs on day 1, 5 tabs on day 2, 4 tabs on day 3, 3 tabs on day 4, 2 tabs on day 5, 1 tab on day 6 06/20/23  Yes Sharyl Panchal, Salvadore Oxford,  NP  acetaminophen (TYLENOL) 500 MG tablet Take 1,000 mg by mouth every 6 (six) hours as needed for moderate pain.    [provider]  ammonium lactate (LAC-HYDRIN) 12 % lotion Apply 1 Application topically daily.    [provider]  atorvastatin (LIPITOR) 80 MG tablet TAKE ONE TABLET BY MOUTH DAILY 02/04/23   Quintella Reichert, MD  ezetimibe (ZETIA) 10 MG tablet TAKE 1 TABLET BY MOUTH DAILY 04/13/23   Quintella Reichert, MD  fluticasone (FLONASE) 50 MCG/ACT nasal spray Place 2 sprays into both nostrils daily. Patient taking differently: Place 2 sprays into both nostrils daily as needed for allergies. 11/26/17   Cathie Hoops, Amy V, PA-C  losartan (COZAAR) 25 MG tablet Take 25 mg by mouth daily. 09/17/22   [provider]  nitroGLYCERIN (NITROSTAT) 0.4 MG SL tablet Place 1 tablet  (0.4 mg total) under the tongue every 5 (five) minutes as needed for chest pain. 01/12/22 10/12/22  Swinyer, Zachary George, NP  PARoxetine (PAXIL) 20 MG tablet Take 10 mg by mouth daily.    [provider]  SUMAtriptan (IMITREX) 25 MG tablet Take 25 mg by mouth every 2 (two) hours as needed.    [provider]  triamterene-hydrochlorothiazide (MAXZIDE-25) 37.5-25 MG tablet Take 1 tablet by mouth daily.    [provider]  vedolizumab (ENTYVIO) 300 MG injection Inject 300 mg into the vein every 8 (eight) weeks.    [provider]    Family History Family History  Problem Relation Age of Onset   CAD Mother 8   Heart attack Mother    Heart attack Father 30   CAD Brother 43   Skin cancer Brother    Other Son        gluten sensitivity   Colon cancer Neg Hx    Esophageal cancer Neg Hx    Rectal cancer Neg Hx    Stomach cancer Neg Hx     Social History Social History   Tobacco Use   Smoking status: Never   Smokeless tobacco: Never  Vaping Use   Vaping status: Never Used  Substance Use Topics   Alcohol use: No   Drug use: No     Allergies   Codeine   Review of Systems Review of Systems  Constitutional:  Negative for chills and fever.  HENT:  Negative for congestion, ear pain, rhinorrhea, sinus pressure, sinus pain and sore throat.   Respiratory:  Positive for cough, chest tightness and shortness of breath. Negative for wheezing.   Cardiovascular:  Negative for chest pain.  Gastrointestinal:  Negative for diarrhea, nausea and vomiting.  Musculoskeletal:  Negative for arthralgias and myalgias.  Skin:  Negative for rash.  Neurological:  Negative for weakness and headaches.     Physical Exam Triage Vital Signs ED Triage Vitals [06/20/23 1408]  Encounter Vitals Group     BP 132/66     Systolic BP Percentile      Diastolic BP Percentile      Pulse Rate 70     Resp 16     Temp 98.6 F (37 C)     Temp Source Oral     SpO2 98 %      Weight 160 lb (72.6 kg)     Height 5\' 5"  (1.651 m)     Head Circumference      Peak Flow      Pain Score 6     Pain Loc      Pain Education  Exclude from Growth Chart    No data found.  Updated Vital Signs BP 132/66 (BP Location: Right Arm)   Pulse 70   Temp 98.6 F (37 C) (Oral)   Resp 16   Ht 5\' 5"  (1.651 m)   Wt 160 lb (72.6 kg)   SpO2 98%   BMI 26.63 kg/m      Physical Exam Constitutional:      General: She is not in acute distress. Cardiovascular:     Rate and Rhythm: Normal rate and regular rhythm.     Heart sounds: Normal heart sounds.  Pulmonary:     Effort: Pulmonary effort is normal.     Breath sounds: Wheezing present. No rhonchi or rales.     Comments: Bilateral expiratory and inspiratory wheezing throughout Lymphadenopathy:     Cervical: No cervical adenopathy.  Skin:    General: Skin is warm and dry.     Findings: No rash.  Neurological:     General: No focal deficit present.     Mental Status: She is alert and oriented to person, place, and time.      UC Treatments / Results  Labs   EKG   Radiology Imaging Orders         DG Chest 2 View      Procedures Procedures (including critical care time)  Medications Ordered in UC Medications - No data to display  Initial Impression / Assessment and Plan / UC Course  I have reviewed the triage vital signs and the nursing notes.  Pertinent labs & imaging results that were available during my care of the patient were reviewed by me and considered in my medical decision making (see chart for details).     67 year old female with 3-week history of cough and chest congestion, worse in the last 3 days with chest tightness and shortness of breath.  DDx include viral bronchitis, bacterial bronchitis, pneumonia.  Chest x-ray pending, she did not want to stay for the results and is aware that she will be called with any abnormal findings and treatment plan.  For now we will send in Rx for Pred  taper x 6 days as well as an albuterol inhaler 1 to 2 puffs every 4-6 hours as needed.  If chest x-ray is positive for pneumonia, will change her antibiotic to azithromycin.  If chest x-ray is negative for pneumonia, she will finish out her Augmentin as previously prescribed.  Recommend cough syrup such as Delsym or Robitussin OTC as needed.  Advised her to follow-up with her PCP if symptoms persist or worsen.  Final Clinical Impressions(s) / UC Diagnoses   Final diagnoses:  Acute cough  Shortness of breath  Chest tightness     Discharge Instructions      You were seen today for cough, shortness of breath and chest tightness.  We have obtained an x-ray to rule out pneumonia and will you will be called only if the x-ray is abnormal.  If the x-ray shows pneumonia, we will change her antibiotic to azithromycin.  If your x-ray does not show pneumonia, we would recommend you finish out your Augmentin as previously prescribed.  I have sent in a round of steroids to reduce the inflammation in your lungs as well as a albuterol inhaler to help with your shortness of breath.  You can use a cough syrup such as Delsym or Robitussin OTC as needed.  I recommend you follow-up with your PCP if symptoms persist or worsen.  ED Prescriptions     Medication Sig Dispense Auth. Provider   predniSONE (DELTASONE) 10 MG tablet Take 6 tabs on day 1, 5 tabs on day 2, 4 tabs on day 3, 3 tabs on day 4, 2 tabs on day 5, 1 tab on day 6 21 tablet Admire Bunnell, Salvadore Oxford, NP   albuterol (VENTOLIN HFA) 108 (90 Base) MCG/ACT inhaler Inhale 1-2 puffs into the lungs every 4 (four) hours as needed for wheezing or shortness of breath. 8 g Lorre Munroe, NP      PDMP not reviewed this encounter.   Lorre Munroe, NP 06/20/23 1427

## 2023-07-21 ENCOUNTER — Ambulatory Visit (INDEPENDENT_AMBULATORY_CARE_PROVIDER_SITE_OTHER): Payer: Medicare Other

## 2023-07-21 VITALS — BP 113/69 | HR 76 | Temp 97.4°F | Resp 18 | Ht 65.0 in | Wt 162.2 lb

## 2023-07-21 DIAGNOSIS — K5120 Ulcerative (chronic) proctitis without complications: Secondary | ICD-10-CM | POA: Diagnosis not present

## 2023-07-21 MED ORDER — VEDOLIZUMAB 300 MG IV SOLR
300.0000 mg | Freq: Once | INTRAVENOUS | Status: AC
  Administered 2023-07-21: 300 mg via INTRAVENOUS
  Filled 2023-07-21: qty 5

## 2023-07-21 NOTE — Progress Notes (Signed)
Diagnosis: Ulcerative Colitis  Provider:  Chilton Greathouse MD  Procedure: IV Infusion  IV Type: Peripheral, IV Location: L Hand  Entyvio (Vedolizumab), Dose: 300 mg  Infusion Start Time: 0913  Infusion Stop Time: 0950  Post Infusion IV Care: Patient declined observation and Peripheral IV Discontinued  Discharge: Condition: Good, Destination: Home . AVS Declined  Performed by:  Loney Hering, LPN

## 2023-07-28 ENCOUNTER — Other Ambulatory Visit: Payer: Self-pay

## 2023-07-30 ENCOUNTER — Encounter: Payer: Self-pay | Admitting: Gastroenterology

## 2023-09-20 ENCOUNTER — Ambulatory Visit (INDEPENDENT_AMBULATORY_CARE_PROVIDER_SITE_OTHER): Payer: Medicare Other

## 2023-09-20 VITALS — BP 127/78 | HR 68 | Temp 98.9°F | Resp 16 | Ht 65.0 in | Wt 164.2 lb

## 2023-09-20 DIAGNOSIS — K5130 Ulcerative (chronic) rectosigmoiditis without complications: Secondary | ICD-10-CM

## 2023-09-20 MED ORDER — VEDOLIZUMAB 300 MG IV SOLR
300.0000 mg | Freq: Once | INTRAVENOUS | Status: AC
Start: 2023-09-20 — End: 2023-09-20
  Administered 2023-09-20: 300 mg via INTRAVENOUS
  Filled 2023-09-20: qty 5

## 2023-09-20 NOTE — Progress Notes (Signed)
Diagnosis: Ulcerative Colitis  Provider:  Chilton Greathouse MD  Procedure: IV Infusion  IV Type: Peripheral, IV Location: R Hand  Entyvio (Vedolizumab), Dose: 300 mg  Infusion Start Time: 0937  Infusion Stop Time: 1018  Post Infusion IV Care: Peripheral IV Discontinued  Discharge: Condition: Good, Destination: Home . AVS Declined  Performed by:  Nat Math, RN

## 2023-10-29 ENCOUNTER — Ambulatory Visit: Payer: Medicare Other | Attending: Cardiology | Admitting: Cardiology

## 2023-10-29 ENCOUNTER — Encounter: Payer: Self-pay | Admitting: Cardiology

## 2023-10-29 VITALS — BP 122/60 | HR 72 | Ht 65.0 in | Wt 167.0 lb

## 2023-10-29 DIAGNOSIS — I2510 Atherosclerotic heart disease of native coronary artery without angina pectoris: Secondary | ICD-10-CM | POA: Diagnosis present

## 2023-10-29 DIAGNOSIS — I10 Essential (primary) hypertension: Secondary | ICD-10-CM | POA: Diagnosis present

## 2023-10-29 DIAGNOSIS — E785 Hyperlipidemia, unspecified: Secondary | ICD-10-CM | POA: Diagnosis present

## 2023-10-29 MED ORDER — NITROGLYCERIN 0.4 MG SL SUBL: 0.4000 mg | SUBLINGUAL_TABLET | SUBLINGUAL | 2 refills | Status: DC | PRN

## 2023-10-29 NOTE — Addendum Note (Signed)
 Addended by: Cherylyn Cos on: 10/29/2023 09:42 AM   Modules accepted: Orders

## 2023-10-29 NOTE — Progress Notes (Signed)
 Cardiology Office Note:    Date:  10/29/2023   ID:  Andrea [name], DOB [birth year], MRN [id number]  PCP:  Marvene Prentice SAUNDERS, FNP   [facility] Providers Cardiologist:  Wilbert Bihari, MD     Referring MD: Marvene Prentice SAUNDERS, FNP    History of Present Illness:    Andrea [name] is a very pleasant 68 y.o. female with a hx of chest pain, hypertension, hyperlipidemia, ulcerative colitis, and PUD.   She was referred to cardiology by PCP for evaluation of chest pain and seen on 07/06/2019.  Coronary CTA revealed calcium  score of 190, 89th percentile for age and sex matched control, mild nonobstructive CAD in the proximal portion of LAD, RCA, and OM1.  Lexiscan  Myoview  was ordered to rule out ischemia and it revealed no evidence of prior infarct or ischemia.  Echo 07/18/2019 revealed LVEF 60 to 65%, no LVH, normal RV, normal valve function.   At her office visit on 01/12/22, she reported increasing episodes of midsternal chest tightness that occur when she is walking accompanied by shortness of breath. Left heart cath on 01/21/22 revealed 20% stenosis proximal RCA and proximal LAD vessels, normal LVEDP, LVEF 55-65%.   She is here today for followup and is doing well.  She denies any chest pain or pressure, SOB, DOE, PND, orthopnea, LE edema, dizziness, palpitations or syncope. She is compliant with her meds and is tolerating meds with no SE.    Past Medical History:  Diagnosis Date   Anemia    Anginal pain (HCC)    Arthritis    Chest discomfort    Disorder of immune system (HCC)    Dyspnea    Dyspnea on exertion    Hypercholesterolemia    Hypertension    Migraine    Non-ulcer dyspepsia    Osteopenia of spine    Peptic ulcer disease    Pneumonia    Polyp of rectum    cancerous   Pre-diabetes    Skin cancer    Ulcerative colitis (HCC)     Past Surgical History:  Procedure Laterality Date   ANKLE FRACTURE SURGERY Left    CARDIAC CATHETERIZATION     COLONOSCOPY      ESOPHAGOGASTRODUODENOSCOPY     hip replacement Bilateral    LEFT HEART CATH AND CORONARY ANGIOGRAPHY N/A 01/21/2022   Procedure: LEFT HEART CATH AND CORONARY ANGIOGRAPHY;  Surgeon: Verlin Lonni JONETTA, MD;  [facility];  Service: Cardiovascular;  Laterality: N/A;   RECTAL BIOPSY N/A 05/04/2022   Procedure: BIOPSY RECTAL VS EXCISION OF PERIANAL LESION UNDER ANOSCOPY;  Surgeon: Teresa Lonni HERO, MD;  Location: [facility];  Service: General;  Laterality: N/A;   TOTAL KNEE ARTHROPLASTY Left 10/19/2022   Procedure: LEFT TOTAL KNEE ARTHROPLASTY;  Surgeon: Jerri Kay HERO, MD;  Location: [facility];  Service: Orthopedics;  Laterality: Left;   TUBAL LIGATION      Current Medications: Current Meds  Medication Sig   acetaminophen  (TYLENOL ) 500 MG tablet Take 1,000 mg by mouth every 6 (six) hours as needed for moderate pain.   albuterol  (VENTOLIN  HFA) 108 (90 Base) MCG/ACT inhaler Inhale 1-2 puffs into the lungs every 4 (four) hours as needed for wheezing or shortness of breath.   ammonium lactate (LAC-HYDRIN) 12 % lotion Apply 1 Application topically daily.   atorvastatin  (LIPITOR) 80 MG tablet TAKE ONE TABLET BY MOUTH DAILY (Patient taking differently: Take 80 mg by mouth daily. Take 1/2 tablet daily)   ezetimibe  (ZETIA ) 10  MG tablet TAKE 1 TABLET BY MOUTH DAILY   fluticasone  (FLONASE ) 50 MCG/ACT nasal spray Place 2 sprays into both nostrils daily. (Patient taking differently: Place 2 sprays into both nostrils daily as needed for allergies.)   losartan  (COZAAR ) 25 MG tablet Take 25 mg by mouth daily.   PARoxetine  (PAXIL ) 20 MG tablet Take 10 mg by mouth daily.   SUMAtriptan (IMITREX) 25 MG tablet Take 25 mg by mouth every 2 (two) hours as needed.   triamterene -hydrochlorothiazide  (MAXZIDE -25) 37.5-25 MG tablet Take 1 tablet by mouth daily.   vedolizumab  (ENTYVIO ) 300 MG injection Inject 300 mg into the vein every 8 (eight) weeks.     Allergies:   Codeine   Social History    Socioeconomic History   Marital status: Divorced    Spouse name: Not on file   Number of children: 3   Years of education: Not on file   Highest education level: Not on file  Occupational History   Occupation: retired  Tobacco Use   Smoking status: Never   Smokeless tobacco: Never  Vaping Use   Vaping status: Never Used  Substance and Sexual Activity   Alcohol use: No   Drug use: No   Sexual activity: Yes  Other Topics Concern   Not on file  Social History Narrative   Not on file   Social Drivers of Health   Financial Resource Strain: Not on file  Food Insecurity: No Food Insecurity (10/20/2022)   Hunger Vital Sign    Worried About Running Out of Food in the Last Year: Never true    Ran Out of Food in the Last Year: Never true  Transportation Needs: No Transportation Needs (10/20/2022)   PRAPARE - Administrator, Civil Service (Medical): No    Lack of Transportation (Non-Medical): No  Physical Activity: Not on file  Stress: Not on file  Social Connections: Unknown (01/26/2022)   Received from [facility]   Social Network    Social Network: Not on file     Family History: The patient's family history includes CAD (age of onset: 8) in her brother; CAD (age of onset: 51) in her mother; Heart attack in her mother; Heart attack (age of onset: 28) in her father; Other in her son; Skin cancer in her brother. There is no history of Colon cancer, Esophageal cancer, Rectal cancer, or Stomach cancer.  ROS:   Please see the history of present illness.  All other systems reviewed and are negative.  Labs/Other Studies Reviewed:    EKG Interpretation Date/Time:  Friday October 29 2023 09:18:41 EST Ventricular Rate:  72 PR Interval:  184 QRS Duration:  88 QT Interval:  390 QTC Calculation: 427 R Axis:   17  Text Interpretation: Normal sinus rhythm Normal ECG No previous ECGs available Confirmed by Shlomo Corning ([id number]) on 10/29/2023 9:30:57 AM     The following studies were reviewed today:  LHC 01/21/22    Prox RCA lesion is 20% stenosed.   Prox LAD lesion is 20% stenosed.   The left ventricular systolic function is normal.   LV end diastolic pressure is normal.   The left ventricular ejection fraction is 55-65% by visual estimate.   Mild non-obstructive CAD Normal LV systolic function    Recommendations: Medical management of mild CAD. Explore other causes of chest pain.     Lexiscan  myoview  08/28/19  Nuclear stress EF: 64%. There was no ST segment deviation noted during stress. The study is  normal. This is a low risk study. The left ventricular ejection fraction is normal (55-65%).   Normal pharmacologic nuclear stress test with no evidence for prior infarct or ischemia.  LVEF 64%.  Echo 07/18/19  Left Ventricle: Left ventricular ejection fraction, by visual estimation,  is 60 to 65%. The left ventricle has normal function. No evidence of left  ventricular regional wall motion abnormalities. There is no left  ventricular hypertrophy. Normal left  ventricular size. Spectral Doppler shows Left ventricular diastolic  parameters were normal pattern of LV diastolic filling.  Right Ventricle: The right ventricular size is normal. No increase in  right ventricular wall thickness. Global RV systolic function is has  normal systolic function. The tricuspid regurgitant velocity is 2.40 m/s,  and with an assumed right atrial pressure   of 3 mmHg, the estimated right ventricular systolic pressure is normal at  25.9 mmHg.  Left Atrium: Left atrial size was normal in size.  Right Atrium: Right atrial size was mildly dilated  Pericardium: Trivial pericardial effusion is present.  Mitral Valve: The mitral valve is normal in structure. No evidence of  mitral valve stenosis by observation. No evidence of mitral valve  regurgitation.  Tricuspid Valve: The tricuspid valve is normal in structure. Tricuspid  valve regurgitation is  trivial by color flow Doppler.  Aortic Valve: The aortic valve has an indeterminant number of cusps.  Aortic valve regurgitation was not visualized by color flow Doppler. The  aortic valve is structurally normal, with no evidence of sclerosis or  stenosis.  Pulmonic Valve: The pulmonic valve was not well visualized. Pulmonic valve  regurgitation is not visualized by color flow Doppler.  Aorta: The aortic root, ascending aorta and aortic arch are all  structurally normal, with no evidence of dilitation or obstruction.  Pulmonary Artery: The pulmonary artery is not well seen.  Venous: The inferior vena cava is normal in size with greater than 50%  respiratory variability, suggesting right atrial pressure of 3 mmHg.  IAS/Shunts: No atrial level shunt detected by color flow Doppler. No  ventricular septal defect is seen or detected. There is no evidence of an  atrial septal defect.   Coronary CTA 08/09/19  1. Coronary calcium  score of 190. This was 47 percentile for age and sex matched control.   2. Normal coronary origin with right dominance.   3. CAD-RADS 2. Mild non-obstructive CAD (25-49%) in the proximal portion of LAD, RCA and OM1. Consider non-atherosclerotic causes of chest pain. Consider preventive therapy and risk factor modification.  Recent Labs: No results found for requested labs within last 365 days.  Recent Lipid Panel    Component Value Date/Time   CHOL 149 08/18/2022 0954   TRIG 77 08/18/2022 0954   HDL 79 08/18/2022 0954   CHOLHDL 1.9 08/18/2022 0954   LDLCALC 55 08/18/2022 0954     Risk Assessment/Calculations:       Physical Exam:    VS:  BP 122/60   Pulse 72   Ht 5' 5 (1.651 m)   Wt 167 lb (75.8 kg)   BMI 27.79 kg/m     Wt Readings from Last 3 Encounters:  10/29/23 167 lb (75.8 kg)  09/20/23 164 lb 3.2 oz (74.5 kg)  07/21/23 162 lb 3.2 oz (73.6 kg)  GEN: Well nourished, well developed in no acute distress HEENT: Normal NECK: No JVD; No  carotid bruits LYMPHATICS: No lymphadenopathy CARDIAC:RRR, no murmurs, rubs, gallops RESPIRATORY:  Clear to auscultation without rales, wheezing or rhonchi  ABDOMEN: Soft, non-tender, non-distended MUSCULOSKELETAL:  No edema; No deformity  SKIN: Warm and dry NEUROLOGIC:  Alert and oriented x 3 PSYCHIATRIC:  Normal affect  Diagnoses:    1. Coronary artery disease involving native coronary artery of native heart without angina pectoris   2. Hyperlipidemia LDL goal <70   3. Essential hypertension       Assessment and Plan:     CAD without angina:   -Mild non-obstructive disease by cardiac cath 01/21/22.  -she has chronic non cardiac CP that is stable -no ASA due to ulcerative colitis -Continue prescription drug management with atorvastatin  40 mg daily and Zetia  10 mg daily with as needed refills  Hyperlipidemia LDL goal < 70:  -I have personally reviewed and interpreted outside labs performed by patient's PCP which showed LDL 57 HDL 58 on 04/12/2023 and ALT 15 on 09/16/2023 -continue prescription drug management with Zetia  10mg  daily and Atorvastatin  40mg  daily with PRn refills   Hypertension:  -BP is adequately controlled on exam today -Continue prescription drug management with losartan  25 mg daily and Maxide 37.5-25 mg daily with as needed refills  Disposition: 1 year   Medication Adjustments/Labs and Tests Ordered: Current medicines are reviewed at length with the patient today.  Concerns regarding medicines are outlined above.  Orders Placed This Encounter  Procedures   EKG 12-Lead   No orders of the defined types were placed in this encounter.   There are no Patient Instructions on file for this visit.   Signed, Wilbert Bihari, MD  10/29/2023 9:33 AM    [facility]

## 2023-10-29 NOTE — Patient Instructions (Signed)
 Medication Instructions:  Your physician recommends that you continue on your current medications as directed. Please refer to the Current Medication list given to you today.  *If you need a refill on your cardiac medications before your next appointment, please call your pharmacy*   Lab Work: None.  If you have labs (blood work) drawn today and your tests are completely normal, you will receive your results only by: MyChart Message (if you have MyChart) OR A paper copy in the mail If you have any lab test that is abnormal or we need to change your treatment, we will call you to review the results.   Testing/Procedures: None.   Follow-Up:   Your next appointment:   1 year(s)  Provider:   Armanda Magic, MD

## 2023-11-02 NOTE — Progress Notes (Unsigned)
Office Visit Note   Patient: Andrea [name]           Date of Birth: July 29, 1956           MRN: [id number] Visit Date: 11/03/2023              Requested by: Soundra Pilon, FNP [phone] [address] [place],  [place] [address] PCP: Soundra Pilon, FNP   Assessment & Plan: Visit Diagnoses: No diagnosis found.  Plan: ***  Follow-Up Instructions: No follow-ups on file.   Orders:  No orders of the defined types were placed in this encounter.  No orders of the defined types were placed in this encounter.     Procedures: No procedures performed   Clinical Data: No additional findings.   Subjective: No chief complaint on file.   HPI  Review of Systems   Objective: Vital Signs: There were no vitals taken for this visit.  Physical Exam  Ortho Exam  Specialty Comments:  No specialty comments available.  Imaging: No results found.   PMFS History: Patient Active Problem List   Diagnosis Date Noted   Status post total left knee replacement 10/19/2022   Primary osteoarthritis of left knee 09/30/2022   Chest pain of uncertain etiology    Ulcerative colitis (HCC) 07/06/2019   PUD (peptic ulcer disease) 07/06/2019   Past Medical History:  Diagnosis Date   Anemia    Anginal pain (HCC)    Arthritis    Chest discomfort    Disorder of immune system (HCC)    Dyspnea    Dyspnea on exertion    Hypercholesterolemia    Hypertension    Migraine    Non-ulcer dyspepsia    Osteopenia of spine    Peptic ulcer disease    Pneumonia    Polyp of rectum    cancerous   Pre-diabetes    Skin cancer    Ulcerative colitis (HCC)     Family History  Problem Relation Age of Onset   CAD Mother 59   Heart attack Mother    Heart attack Father 74   CAD Brother 74   Skin cancer Brother    Other Son        gluten sensitivity   Colon cancer Neg Hx    Esophageal cancer Neg Hx    Rectal cancer Neg Hx    Stomach cancer Neg Hx     Past Surgical History:   Procedure Laterality Date   ANKLE FRACTURE SURGERY Left    CARDIAC CATHETERIZATION     COLONOSCOPY     ESOPHAGOGASTRODUODENOSCOPY     hip replacement Bilateral    LEFT HEART CATH AND CORONARY ANGIOGRAPHY N/A 01/21/2022   Procedure: LEFT HEART CATH AND CORONARY ANGIOGRAPHY;  Surgeon: Kathleene Hazel, MD;  [facility];  Service: Cardiovascular;  Laterality: N/A;   RECTAL BIOPSY N/A 05/04/2022   Procedure: BIOPSY RECTAL VS EXCISION OF PERIANAL LESION UNDER ANOSCOPY;  Surgeon: Andria Meuse, MD;  Location: [facility];  Service: General;  Laterality: N/A;   TOTAL KNEE ARTHROPLASTY Left 10/19/2022   Procedure: LEFT TOTAL KNEE ARTHROPLASTY;  Surgeon: Tarry Kos, MD;  Location: [facility];  Service: Orthopedics;  Laterality: Left;   TUBAL LIGATION     Social History   Occupational History   Occupation: retired  Tobacco Use   Smoking status: Never   Smokeless tobacco: Never  Vaping Use   Vaping status: Never Used  Substance and Sexual  Activity   Alcohol use: No   Drug use: No   Sexual activity: Yes

## 2023-11-03 ENCOUNTER — Other Ambulatory Visit (INDEPENDENT_AMBULATORY_CARE_PROVIDER_SITE_OTHER): Payer: Medicare Other

## 2023-11-03 ENCOUNTER — Ambulatory Visit (INDEPENDENT_AMBULATORY_CARE_PROVIDER_SITE_OTHER): Payer: Medicare Other | Admitting: Orthopaedic Surgery

## 2023-11-03 DIAGNOSIS — G8929 Other chronic pain: Secondary | ICD-10-CM

## 2023-11-03 DIAGNOSIS — M25561 Pain in right knee: Secondary | ICD-10-CM

## 2023-11-03 DIAGNOSIS — Z96652 Presence of left artificial knee joint: Secondary | ICD-10-CM

## 2023-11-03 MED ORDER — METHYLPREDNISOLONE ACETATE 40 MG/ML IJ SUSP
40.0000 mg | INTRAMUSCULAR | Status: AC | PRN
  Administered 2023-11-03: 40 mg via INTRA_ARTICULAR

## 2023-11-03 MED ORDER — BUPIVACAINE HCL 0.5 % IJ SOLN
2.0000 mL | INTRAMUSCULAR | Status: AC | PRN
  Administered 2023-11-03: 2 mL via INTRA_ARTICULAR

## 2023-11-03 MED ORDER — LIDOCAINE HCL 1 % IJ SOLN
2.0000 mL | INTRAMUSCULAR | Status: AC | PRN
  Administered 2023-11-03: 2 mL

## 2023-11-09 ENCOUNTER — Ambulatory Visit (INDEPENDENT_AMBULATORY_CARE_PROVIDER_SITE_OTHER): Payer: Medicare Other | Admitting: Gastroenterology

## 2023-11-09 ENCOUNTER — Encounter: Payer: Self-pay | Admitting: Gastroenterology

## 2023-11-09 VITALS — BP 136/64 | HR 62 | Ht 65.0 in | Wt 166.2 lb

## 2023-11-09 DIAGNOSIS — D013 Carcinoma in situ of anus and anal canal: Secondary | ICD-10-CM | POA: Insufficient documentation

## 2023-11-09 DIAGNOSIS — Z85048 Personal history of other malignant neoplasm of rectum, rectosigmoid junction, and anus: Secondary | ICD-10-CM | POA: Diagnosis not present

## 2023-11-09 DIAGNOSIS — K6289 Other specified diseases of anus and rectum: Secondary | ICD-10-CM | POA: Insufficient documentation

## 2023-11-09 DIAGNOSIS — K644 Residual hemorrhoidal skin tags: Secondary | ICD-10-CM

## 2023-11-09 MED ORDER — HYDROCORTISONE (PERIANAL) 2.5 % EX CREA: 1.0000 | TOPICAL_CREAM | Freq: Two times a day (BID) | CUTANEOUS | 1 refills | Status: DC

## 2023-11-09 NOTE — Patient Instructions (Signed)
 We have sent the following medications to your pharmacy for you to pick up at your convenience: Hydrocortisone cream 2.5 % twice daily.   Continue Destin cream as needed.  Referral placed to [facility] - Dr. Cliffton Asters.   _______________________________________________________  If your blood pressure at your visit was 140/90 or greater, please contact your primary care physician to follow up on this.  _______________________________________________________  If you are age 69 or older, your body mass index should be between 23-30. Your Body mass index is 27.67 kg/m. If this is out of the aforementioned range listed, please consider follow up with your Primary Care Provider.  If you are age 85 or younger, your body mass index should be between 19-25. Your Body mass index is 27.67 kg/m. If this is out of the aformentioned range listed, please consider follow up with your Primary Care Provider.   ________________________________________________________  The [facility] providers would like to encourage you to use [facility] to communicate with providers for non-urgent requests or questions.  Due to long hold times on the telephone, sending your provider a message by [facility] may be a faster and more efficient way to get a response.  Please allow 48 business hours for a response.  Please remember that this is for non-urgent requests.  _______________________________________________________

## 2023-11-09 NOTE — Progress Notes (Signed)
 11/09/2023 Andrea [name] [id number] 1956/09/04   HISTORY OF PRESENT ILLNESS: This is a 68 year old female who is a patient of Dr. Irving Burton.  She has longstanding rectosigmoiditis, which is well-controlled controlled with Entyvio.  She was on Remicade for years, but switched to [facility] after diagnosis of early-stage anal cancer/AIN 3 in 04/2022.  The cancer was resected for cure by Dr. Cliffton Asters.  Colonoscopy last year as below.  She is here today with complaints of rectal discomfort that has been bothering her for the past few months.  She sees a little bit of bright red blood on the toilet paper upon wiping.  Has been using some Desitin and Preparation H.  No issues or complaints in regards to her IBD.  Colonoscopy 01/2023:  - The examined portion of the ileum was normal. - Normal mucosa in the entire examined colon. - Two diminutive polyps in the transverse colon and at the hepatic flexure, removed with a cold snare. Resected and retrieved. - Diverticulosis in the entire examined colon. - External and internal hemorrhoids. - The examination was otherwise normal on direct and retroflexion views. - Several biopsies were obtained in the sigmoid colon, in the descending colon, in the transverse colon and in the ascending colon.  Colitis remains in complete mucosal remission.  1. Surgical [P], colon, ascending COLONIC MUCOSA WITHOUT SIGNIFICANT DIAGNOSTIC ALTERATION. NEGATIVE FOR MICROSCOPIC COLITIS OR COLLAGENOUS COLITIS. NEGATIVE FOR ACTIVITY, CHRONICITY, GRANULOMA, DYSPLASIA OR MALIGNANCY. 2. Surgical [P], colon, hepatic flexure, transverse, polyp (2) TUBULAR ADENOMA. NEGATIVE FOR HIGH-GRADE DYSPLASIA. 3. Surgical [P], colon, transverse COLONIC MUCOSA WITHOUT SIGNIFICANT DIAGNOSTIC ALTERATION. NEGATIVE FOR MICROSCOPIC COLITIS OR COLLAGENOUS COLITIS. NEGATIVE FOR ACTIVITY, CHRONICITY, GRANULOMA, DYSPLASIA OR MALIGNANCY. 4. Surgical [P], colon, descending COLONIC MUCOSA WITHOUT SIGNIFICANT  DIAGNOSTIC ALTERATION. NEGATIVE FOR MICROSCOPIC COLITIS OR COLLAGENOUS COLITIS. NEGATIVE FOR ACTIVITY, CHRONICITY, GRANULOMA, DYSPLASIA OR MALIGNANCY. 5. Surgical [P], colon, sigmoid COLONIC MUCOSA WITHOUT SIGNIFICANT DIAGNOSTIC ALTERATION. NEGATIVE FOR MICROSCOPIC COLITIS OR COLLAGENOUS COLITIS. NEGATIVE FOR ACTIVITY, CHRONICITY, GRANULOMA, DYSPLASIA OR MALIGNANCY.   Past Medical History:  Diagnosis Date   Anemia    Anginal pain (HCC)    Arthritis    Chest discomfort    Disorder of immune system (HCC)    Dyspnea    Dyspnea on exertion    Hypercholesterolemia    Hypertension    Migraine    Non-ulcer dyspepsia    Osteopenia of spine    Peptic ulcer disease    Pneumonia    Polyp of rectum    cancerous   Pre-diabetes    Skin cancer    Ulcerative colitis (HCC)    Past Surgical History:  Procedure Laterality Date   ANKLE FRACTURE SURGERY Left    CARDIAC CATHETERIZATION     COLONOSCOPY     ESOPHAGOGASTRODUODENOSCOPY     hip replacement Bilateral    LEFT HEART CATH AND CORONARY ANGIOGRAPHY N/A 01/21/2022   Procedure: LEFT HEART CATH AND CORONARY ANGIOGRAPHY;  Surgeon: Kathleene Hazel, MD;  [facility];  Service: Cardiovascular;  Laterality: N/A;   RECTAL BIOPSY N/A 05/04/2022   Procedure: BIOPSY RECTAL VS EXCISION OF PERIANAL LESION UNDER ANOSCOPY;  Surgeon: Andria Meuse, MD;  Location: [facility];  Service: General;  Laterality: N/A;   TOTAL KNEE ARTHROPLASTY Left 10/19/2022   Procedure: LEFT TOTAL KNEE ARTHROPLASTY;  Surgeon: Tarry Kos, MD;  Location: [facility];  Service: Orthopedics;  Laterality: Left;   TUBAL LIGATION      reports that she has never smoked.  She has never used smokeless tobacco. She reports that she does not drink alcohol and does not use drugs. family history includes CAD (age of onset: 34) in her brother; CAD (age of onset: 6) in her mother; Heart attack in her mother; Heart attack (age of onset: 42) in her father; Other in  her son; Skin cancer in her brother. Allergies  Allergen Reactions   Codeine Nausea And Vomiting      Outpatient Encounter Medications as of 11/09/2023  Medication Sig   acetaminophen (TYLENOL) 500 MG tablet Take 1,000 mg by mouth every 6 (six) hours as needed for moderate pain.   albuterol (VENTOLIN HFA) 108 (90 Base) MCG/ACT inhaler Inhale 1-2 puffs into the lungs every 4 (four) hours as needed for wheezing or shortness of breath.   ammonium lactate (LAC-HYDRIN) 12 % lotion Apply 1 Application topically daily.   atorvastatin (LIPITOR) 80 MG tablet TAKE ONE TABLET BY MOUTH DAILY (Patient taking differently: Take 80 mg by mouth daily. Take 1/2 tablet daily)   ezetimibe (ZETIA) 10 MG tablet TAKE 1 TABLET BY MOUTH DAILY   fluticasone (FLONASE) 50 MCG/ACT nasal spray Place 2 sprays into both nostrils daily. (Patient taking differently: Place 2 sprays into both nostrils daily as needed for allergies.)   losartan (COZAAR) 25 MG tablet Take 25 mg by mouth daily.   nitroGLYCERIN (NITROSTAT) 0.4 MG SL tablet Place 1 tablet (0.4 mg total) under the tongue every 5 (five) minutes as needed for chest pain.   PARoxetine (PAXIL) 20 MG tablet Take 10 mg by mouth daily.   SUMAtriptan (IMITREX) 25 MG tablet Take 25 mg by mouth every 2 (two) hours as needed.   triamterene-hydrochlorothiazide (MAXZIDE-25) 37.5-25 MG tablet Take 1 tablet by mouth daily.   vedolizumab (ENTYVIO) 300 MG injection Inject 300 mg into the vein every 8 (eight) weeks.   No facility-administered encounter medications on file as of 11/09/2023.    REVIEW OF SYSTEMS  : All other systems reviewed and negative except where noted in the History of Present Illness.   PHYSICAL EXAM: BP 136/64   Pulse 62   Ht 5\' 5"  (1.651 m)   Wt 166 lb 4 oz (75.4 kg)   BMI 27.67 kg/m  General: Well developed white female in no acute distress Head: Normocephalic and atraumatic Eyes:  Sclerae anicteric,conjunctive pink. Ears: Normal auditory  acuity Rectal: Small external hemorrhoids seen but there was some firmness associated with the area.  There was some skin breakdown/excoriation to the area as well.  DRE did not reveal any masses. Musculoskeletal: Symmetrical with no gross deformities  Skin: No lesions on visible extremities Neurological: Alert oriented x 4, grossly non-focal. Psychological:  Alert and cooperative. Normal mood and affect  ASSESSMENT AND PLAN: *History of early-stage anal cancer in the background of AIN 3: Presenting with some perianal discomfort.  She does have small external hemorrhoids, but there was some firmness in that area.  There is also some skin breakdown/excoriation.  Not sure if this could be scar tissue, but with her history I would be more comfortable if she were evaluated again by colorectal surgery and they could obtain biopsies if needed.  She can continue using the Desitin on the area for the skin breakdown as it acts as a barrier.  Will also prescribe some hydrocortisone to use for inflammation.  Prescription sent to pharmacy.   CC:  Soundra Pilon, FNP

## 2023-11-12 NOTE — Progress Notes (Signed)
 ____________________________________________________________  Attending physician addendum:  Thank you for sending this case to me. I have reviewed the entire note and agree with the plan.  Thank you for your attention to that.  Yes, it seems most likely to be fibrosis and some friable mucosa after a prior excision of AIN, but I agree entirely would be best for her colorectal surgeon to take a look at it.  Amada Jupiter, MD  ____________________________________________________________

## 2023-11-15 ENCOUNTER — Ambulatory Visit (INDEPENDENT_AMBULATORY_CARE_PROVIDER_SITE_OTHER): Payer: Medicare Other

## 2023-11-15 VITALS — BP 122/62 | HR 68 | Temp 97.4°F | Resp 18 | Ht 65.0 in | Wt 166.0 lb

## 2023-11-15 DIAGNOSIS — K5130 Ulcerative (chronic) rectosigmoiditis without complications: Secondary | ICD-10-CM

## 2023-11-15 MED ORDER — VEDOLIZUMAB 300 MG IV SOLR
300.0000 mg | Freq: Once | INTRAVENOUS | Status: AC
  Administered 2023-11-15: 300 mg via INTRAVENOUS
  Filled 2023-11-15: qty 5

## 2023-11-15 NOTE — Progress Notes (Signed)
 Diagnosis: Ulcerative rectosigmoiditis   Provider:  Chilton Greathouse MD  Procedure: IV Infusion  IV Type: Peripheral, IV Location: R Antecubital  Entyvio (Vedolizumab), Dose: 300 mg  Infusion Start Time: 1003  Infusion Stop Time: 1038  Post Infusion IV Care: Peripheral IV Discontinued  Discharge: Condition: Good, Destination: Home . AVS Declined  Performed by:  Garnette Czech, RN

## 2023-11-22 ENCOUNTER — Other Ambulatory Visit: Payer: Self-pay | Admitting: Family Medicine

## 2023-11-22 DIAGNOSIS — Z1231 Encounter for screening mammogram for malignant neoplasm of breast: Secondary | ICD-10-CM

## 2023-12-15 ENCOUNTER — Telehealth: Payer: Self-pay

## 2023-12-15 NOTE — Telephone Encounter (Signed)
 Auth Submission: NO AUTH NEEDED  Payer: MEDICARE A/B & BCBS SUPP Medication & CPT/J Code(s) submitted: Entyvio (Vedolizumab) [id number] Route of submission (phone, fax, portal):  Phone # Fax # Auth type: Buy/Bill Units/visits requested: 300mg  x 7 doses Reference number:  Approval from: 08/19/22 to 10/21/24

## 2023-12-24 ENCOUNTER — Ambulatory Visit
Admission: RE | Admit: 2023-12-24 | Discharge: 2023-12-24 | Disposition: A | Discharge status: Home / Self Care | Source: Ambulatory Visit | Attending: Family Medicine | Admitting: Family Medicine

## 2023-12-24 DIAGNOSIS — Z1231 Encounter for screening mammogram for malignant neoplasm of breast: Secondary | ICD-10-CM

## 2024-01-06 ENCOUNTER — Encounter (HOSPITAL_BASED_OUTPATIENT_CLINIC_OR_DEPARTMENT_OTHER): Payer: Self-pay | Admitting: Surgery

## 2024-01-06 ENCOUNTER — Other Ambulatory Visit: Payer: Self-pay

## 2024-01-11 ENCOUNTER — Ambulatory Visit (INDEPENDENT_AMBULATORY_CARE_PROVIDER_SITE_OTHER): Payer: Medicare Other

## 2024-01-11 ENCOUNTER — Encounter (HOSPITAL_BASED_OUTPATIENT_CLINIC_OR_DEPARTMENT_OTHER)
Admission: RE | Admit: 2024-01-11 | Discharge: 2024-01-11 | Disposition: A | Discharge status: Home / Self Care | Source: Ambulatory Visit | Attending: Surgery | Admitting: Surgery

## 2024-01-11 VITALS — BP 152/84 | HR 67 | Temp 97.9°F | Resp 18 | Ht 65.0 in | Wt 173.0 lb

## 2024-01-11 DIAGNOSIS — Z01818 Encounter for other preprocedural examination: Secondary | ICD-10-CM | POA: Diagnosis present

## 2024-01-11 DIAGNOSIS — I159 Secondary hypertension, unspecified: Secondary | ICD-10-CM | POA: Diagnosis not present

## 2024-01-11 DIAGNOSIS — Z01812 Encounter for preprocedural laboratory examination: Secondary | ICD-10-CM | POA: Insufficient documentation

## 2024-01-11 DIAGNOSIS — K5130 Ulcerative (chronic) rectosigmoiditis without complications: Secondary | ICD-10-CM | POA: Diagnosis not present

## 2024-01-11 LAB — BASIC METABOLIC PANEL WITH GFR
Anion gap: 9 (ref 5–15)
BUN: 18 mg/dL (ref 8–23)
CO2: 22 mmol/L (ref 22–32)
Calcium: 9.5 mg/dL (ref 8.9–10.3)
Chloride: 108 mmol/L (ref 98–111)
Creatinine, Ser: 0.8 mg/dL (ref 0.44–1.00)
GFR, Estimated: >60 mL/min (ref >60)
Glucose, Bld: 94 mg/dL (ref 70–99)
Potassium: 4.2 mmol/L (ref 3.5–5.1)
Sodium: 139 mmol/L (ref 135–145)

## 2024-01-11 MED ORDER — VEDOLIZUMAB 300 MG IV SOLR
300.0000 mg | Freq: Once | INTRAVENOUS | Status: AC
  Administered 2024-01-11: 300 mg via INTRAVENOUS
  Filled 2024-01-11: qty 5

## 2024-01-11 NOTE — Progress Notes (Signed)
 Diagnosis: Ulcerative Rectosigmoiditis  Provider:  Phyllis Breeze MD  Procedure: IV Infusion  IV Type: Peripheral, IV Location: R Hand  Entyvio  (Vedolizumab ), Dose: 300 mg  Infusion Start Time: 0913  Infusion Stop Time: 0950  Post Infusion IV Care: Peripheral IV Discontinued  Discharge: Condition: Good, Destination: Home . AVS Declined  Performed by:  Kalima Saylor, RN

## 2024-01-12 ENCOUNTER — Ambulatory Visit: Payer: Self-pay | Admitting: Surgery

## 2024-01-13 MED ORDER — SODIUM CHLORIDE 0.9 % IV SOLN
2.0000 g | INTRAVENOUS | Status: DC
  Filled 2024-01-13: qty 2

## 2024-01-13 MED ORDER — SODIUM CHLORIDE 0.9 % IV SOLN
2.0000 g | INTRAVENOUS | Status: DC
  Administered 2024-01-14: 2 g via INTRAVENOUS
  Filled 2024-01-13 (×2): qty 2

## 2024-01-14 ENCOUNTER — Encounter (HOSPITAL_BASED_OUTPATIENT_CLINIC_OR_DEPARTMENT_OTHER)
Admission: RE | Disposition: A | Discharge status: Home / Self Care | Payer: Self-pay | Source: Home / Self Care | Attending: Surgery

## 2024-01-14 ENCOUNTER — Ambulatory Visit (HOSPITAL_BASED_OUTPATIENT_CLINIC_OR_DEPARTMENT_OTHER)
Admission: RE | Admit: 2024-01-14 | Discharge: 2024-01-14 | Disposition: A | Discharge status: Home / Self Care | Attending: Surgery | Admitting: Surgery

## 2024-01-14 ENCOUNTER — Encounter (HOSPITAL_BASED_OUTPATIENT_CLINIC_OR_DEPARTMENT_OTHER): Payer: Self-pay | Admitting: Surgery

## 2024-01-14 ENCOUNTER — Ambulatory Visit (HOSPITAL_BASED_OUTPATIENT_CLINIC_OR_DEPARTMENT_OTHER): Admitting: Anesthesiology

## 2024-01-14 ENCOUNTER — Other Ambulatory Visit: Payer: Self-pay

## 2024-01-14 DIAGNOSIS — C44520 Squamous cell carcinoma of anal skin: Secondary | ICD-10-CM | POA: Diagnosis not present

## 2024-01-14 DIAGNOSIS — K5190 Ulcerative colitis, unspecified, without complications: Secondary | ICD-10-CM | POA: Insufficient documentation

## 2024-01-14 DIAGNOSIS — I159 Secondary hypertension, unspecified: Secondary | ICD-10-CM

## 2024-01-14 DIAGNOSIS — K6289 Other specified diseases of anus and rectum: Secondary | ICD-10-CM

## 2024-01-14 DIAGNOSIS — I10 Essential (primary) hypertension: Secondary | ICD-10-CM | POA: Insufficient documentation

## 2024-01-14 DIAGNOSIS — K621 Rectal polyp: Secondary | ICD-10-CM | POA: Insufficient documentation

## 2024-01-14 DIAGNOSIS — K629 Disease of anus and rectum, unspecified: Secondary | ICD-10-CM | POA: Diagnosis present

## 2024-01-14 HISTORY — PX: TUMOR EXCISION: SHX421

## 2024-01-14 SURGERY — TRANSRECTAL TUMOR EXCISION
Anesthesia: General

## 2024-01-14 MED ORDER — OXYCODONE HCL 5 MG/5ML PO SOLN: 5.0000 mg | Freq: Once | ORAL | Status: DC | PRN

## 2024-01-14 MED ORDER — BUPIVACAINE LIPOSOME 1.3 % IJ SUSP
INTRAMUSCULAR | Status: DC | PRN
  Administered 2024-01-14: 20 mL

## 2024-01-14 MED ORDER — OXYCODONE HCL 5 MG PO TABS: 5.0000 mg | ORAL_TABLET | Freq: Once | ORAL | Status: DC | PRN

## 2024-01-14 MED ORDER — FENTANYL CITRATE (PF) 100 MCG/2ML IJ SOLN
INTRAMUSCULAR | Status: DC | PRN
  Administered 2024-01-14 (×2): 50 ug via INTRAVENOUS

## 2024-01-14 MED ORDER — ROCURONIUM BROMIDE 10 MG/ML (PF) SYRINGE
PREFILLED_SYRINGE | INTRAVENOUS | Status: DC | PRN
  Administered 2024-01-14: 50 mg via INTRAVENOUS

## 2024-01-14 MED ORDER — FENTANYL CITRATE (PF) 100 MCG/2ML IJ SOLN: 25.0000 ug | INTRAMUSCULAR | Status: DC | PRN

## 2024-01-14 MED ORDER — LIDOCAINE 2% (20 MG/ML) 5 ML SYRINGE
INTRAMUSCULAR | Status: AC
  Filled 2024-01-14: qty 5

## 2024-01-14 MED ORDER — BUPIVACAINE LIPOSOME 1.3 % IJ SUSP: 20.0000 mL | Freq: Once | INTRAMUSCULAR | Status: DC

## 2024-01-14 MED ORDER — CEFOTETAN DISODIUM 2 G IJ SOLR: 2.0000 g | INTRAMUSCULAR | Status: DC

## 2024-01-14 MED ORDER — SCOPOLAMINE 1 MG/3DAYS TD PT72
1.0000 | MEDICATED_PATCH | TRANSDERMAL | Status: DC
  Administered 2024-01-14: 1.5 mg via TRANSDERMAL

## 2024-01-14 MED ORDER — LACTATED RINGERS IV SOLN: INTRAVENOUS | Status: DC

## 2024-01-14 MED ORDER — DEXAMETHASONE SODIUM PHOSPHATE 10 MG/ML IJ SOLN
INTRAMUSCULAR | Status: DC | PRN
  Administered 2024-01-14: 5 mg via INTRAVENOUS

## 2024-01-14 MED ORDER — MIDAZOLAM HCL 2 MG/2ML IJ SOLN
INTRAMUSCULAR | Status: DC | PRN
  Administered 2024-01-14: 2 mg via INTRAVENOUS

## 2024-01-14 MED ORDER — ACETAMINOPHEN 500 MG PO TABS
1000.0000 mg | ORAL_TABLET | Freq: Once | ORAL | Status: AC
Start: 2024-01-14 — End: 2024-01-14
  Administered 2024-01-14: 500 mg via ORAL

## 2024-01-14 MED ORDER — ACETAMINOPHEN 500 MG PO TABS: 1000.0000 mg | ORAL_TABLET | ORAL | Status: DC

## 2024-01-14 MED ORDER — BUPIVACAINE-EPINEPHRINE 0.25% -1:200000 IJ SOLN
INTRAMUSCULAR | Status: DC | PRN
  Administered 2024-01-14: 30 mL

## 2024-01-14 MED ORDER — DROPERIDOL 2.5 MG/ML IJ SOLN: 0.6250 mg | Freq: Once | INTRAMUSCULAR | Status: DC | PRN

## 2024-01-14 MED ORDER — ONDANSETRON HCL 4 MG/2ML IJ SOLN
INTRAMUSCULAR | Status: DC | PRN
  Administered 2024-01-14: 4 mg via INTRAVENOUS

## 2024-01-14 MED ORDER — ACETAMINOPHEN 500 MG PO TABS
ORAL_TABLET | ORAL | Status: AC
  Filled 2024-01-14: qty 2

## 2024-01-14 MED ORDER — FLEET ENEMA RE ENEM
1.0000 | ENEMA | Freq: Once | RECTAL | Status: DC
Start: 2024-01-15 — End: 2024-01-14
  Filled 2024-01-14: qty 1

## 2024-01-14 MED ORDER — SUGAMMADEX SODIUM 200 MG/2ML IV SOLN
INTRAVENOUS | Status: DC | PRN
  Administered 2024-01-14: 200 mg via INTRAVENOUS

## 2024-01-14 MED ORDER — LIDOCAINE 2% (20 MG/ML) 5 ML SYRINGE
INTRAMUSCULAR | Status: DC | PRN
  Administered 2024-01-14: 40 mg via INTRAVENOUS

## 2024-01-14 MED ORDER — OXYCODONE HCL 5 MG PO TABS: 5.0000 mg | ORAL_TABLET | Freq: Four times a day (QID) | ORAL | 0 refills | Status: AC | PRN

## 2024-01-14 MED ORDER — PROPOFOL 10 MG/ML IV BOLUS
INTRAVENOUS | Status: DC | PRN
  Administered 2024-01-14: 130 mg via INTRAVENOUS

## 2024-01-14 MED ORDER — ROCURONIUM BROMIDE 10 MG/ML (PF) SYRINGE
PREFILLED_SYRINGE | INTRAVENOUS | Status: AC
  Filled 2024-01-14: qty 10

## 2024-01-14 MED ORDER — MIDAZOLAM HCL 2 MG/2ML IJ SOLN
INTRAMUSCULAR | Status: AC
  Filled 2024-01-14: qty 2

## 2024-01-14 MED ORDER — SCOPOLAMINE 1 MG/3DAYS TD PT72
MEDICATED_PATCH | TRANSDERMAL | Status: AC
  Filled 2024-01-14: qty 1

## 2024-01-14 MED ORDER — FLEET ENEMA RE ENEM
1.0000 | ENEMA | Freq: Once | RECTAL | Status: DC
  Filled 2024-01-14: qty 1

## 2024-01-14 MED ORDER — ACETAMINOPHEN 10 MG/ML IV SOLN: 1000.0000 mg | Freq: Once | INTRAVENOUS | Status: DC | PRN

## 2024-01-14 MED ORDER — FENTANYL CITRATE (PF) 100 MCG/2ML IJ SOLN
INTRAMUSCULAR | Status: AC
  Filled 2024-01-14: qty 2

## 2024-01-14 MED ORDER — CEFAZOLIN SODIUM-DEXTROSE 2-4 GM/100ML-% IV SOLN
INTRAVENOUS | Status: AC
  Filled 2024-01-14: qty 100

## 2024-01-14 SURGICAL SUPPLY — 37 items
BLADE EXTENDED COATED 6.5IN (ELECTRODE) ×1 IMPLANT
BLADE SURG 15 STRL LF DISP TIS (BLADE) IMPLANT
BRIEF MESH DISP 2XL (UNDERPADS AND DIAPERS) ×1 IMPLANT
COVER BACK TABLE 60X90IN (DRAPES) ×1 IMPLANT
COVER MAYO STAND STRL (DRAPES) ×1 IMPLANT
DRAPE LAPAROTOMY 100X72 PEDS (DRAPES) ×1 IMPLANT
DRAPE UTILITY XL STRL (DRAPES) ×1 IMPLANT
ELECTRODE REM PT RTRN 9FT ADLT (ELECTROSURGICAL) ×1 IMPLANT
GAUZE 4X4 16PLY ~~LOC~~+RFID DBL (SPONGE) ×1 IMPLANT
GAUZE PAD ABD 8X10 STRL (GAUZE/BANDAGES/DRESSINGS) IMPLANT
GAUZE SPONGE 4X4 12PLY STRL (GAUZE/BANDAGES/DRESSINGS) ×1 IMPLANT
GLOVE BIO SURGEON STRL SZ7.5 (GLOVE) ×1 IMPLANT
GLOVE INDICATOR 8.0 STRL GRN (GLOVE) ×1 IMPLANT
GOWN STRL REUS W/TWL XL LVL3 (GOWN DISPOSABLE) ×1 IMPLANT
KIT SIGMOIDOSCOPE (SET/KITS/TRAYS/PACK) IMPLANT
LIGASURE 7.4 SM JAW OPEN (ELECTROSURGICAL) IMPLANT
LOOP VASCLR MAXI BLUE 18IN ST (MISCELLANEOUS) IMPLANT
LOOPS VASCLR MAXI BLUE 18IN ST (MISCELLANEOUS) IMPLANT
NDL HYPO 22X1.5 SAFETY MO (MISCELLANEOUS) ×1 IMPLANT
NEEDLE HYPO 22X1.5 SAFETY MO (MISCELLANEOUS) ×1 IMPLANT
NS IRRIG 500ML POUR BTL (IV SOLUTION) ×1 IMPLANT
PACK BASIN DAY SURGERY FS (CUSTOM PROCEDURE TRAY) ×1 IMPLANT
PENCIL SMOKE EVACUATOR (MISCELLANEOUS) ×1 IMPLANT
PUNCH BIOPSY 4MM DISP (MISCELLANEOUS) IMPLANT
SLEEVE SCD COMPRESS KNEE MED (STOCKING) ×1 IMPLANT
SPONGE HEMORRHOID 8X3CM (HEMOSTASIS) IMPLANT
SPONGE SURGIFOAM ABS GEL 100 (HEMOSTASIS) IMPLANT
SPONGE SURGIFOAM ABS GEL 12-7 (HEMOSTASIS) IMPLANT
SUT VIC AB 2-0 UR6 27 (SUTURE) ×1 IMPLANT
SUT VIC AB 3-0 SH 27X BRD (SUTURE) ×1 IMPLANT
SYR BULB EAR ULCER 3OZ GRN STR (SYRINGE) IMPLANT
SYR CONTROL 10ML LL (SYRINGE) ×1 IMPLANT
TOWEL GREEN STERILE FF (TOWEL DISPOSABLE) ×1 IMPLANT
TRAY DSU PREP LF (CUSTOM PROCEDURE TRAY) ×1 IMPLANT
TUBE CONNECTING 20X1/4 (TUBING) ×1 IMPLANT
WATER STERILE IRR 500ML POUR (IV SOLUTION) IMPLANT
YANKAUER SUCT BULB TIP NO VENT (SUCTIONS) ×1 IMPLANT

## 2024-01-14 NOTE — H&P (Signed)
 CC: Here today for surgery  HPI: Andrea [name] is an 68 y.o. female with history of UC (follows with Dr. Dominic Friendly and is on Remicade /Azathioprine) -he was referred to see us  for some anal irritation/Hugh Garrow like skin changes noted on examination in the office with them back 11/2021. She was first diagnosed with ulcerative colitis back in March 2005.  Last colonoscopy 06/2020 showed a small 4 mm rectal hyperplastic polyp and sigmoid diverticulosis. She was noted to be stable on infliximab  as well as her low-dose azathioprine. She was seen in their office back in March with a several week history of perianal irritation when she has bowel movements. She was taking a stool softener at that time. She describes an itching/burning sensation. She has tried a variety of over-the-counter topical ointments including Neosporin, Vaseline, Preparation H, Aquaphor.  She reports that she first noticed all the symptoms somewhere back in December/January and now having had them for 5 to 6 months. They have not gotten better nor worse. Nothing seems to be progressive at this juncture. She denies any issue with incontinence to gas, liquid, or solid stool.  OR 05/04/22 - Transanal excision of posterior midline anal canal lesion under endoscopic guidance Punch biopsy x2 of perianal skin, left lateral Anorectal exam under anesthesia  OR FINDINGS: Posterior midline Andrea [name] plaque anal lesion 1 x 1 cm in size, visualized under anoscopy - fully excised. Very sharp margin area of redness at junction of the anoderm and anal margin. This was punch biopsied x 2 including portion of normal skin, submitted as separate specimen from posterior midline tissue. Small internal hemorrhoids.  PATH: A. ANAL LESION, POSTERIOR MIDLINE, EXCISION:  - Superficially invasive squamous cell carcinoma in the background of  extensive high-grade squamous intraepithelial lesion/anal  intraepithelial neoplasia 3 of 3 (HGSIL/AIN 3)   B. SKIN, ANAL  MARGIN, LEFT LATERAL, BIOPSY:  - High-grade squamous intraepithelial lesion/anal intraepithelial  neoplasia 3 of 3 (HGSIL/AIN 3).  Skin lesion/skin cancer removed from her chest. She met back with her gastroenterologist in [facility]. She indicates that they believe this could be related to side effects of Remicade  and or changing her medication regimen. No other complaints at present. No lumps bumps or issues with the perianal area. She did complete treatment with Aldara.  Last seen in our office 07/2022. We did recommend a 19-month follow-up. She returns today however for follow-up. She reports she has had for at least the last 3 to 4 months of perianal itching and some occasional tiny drops of blood when wiping. She has follow-up with GI whom had instructed her to return here to be seen given her history. She has been switched from Remicade  to Entyvio . Otherwise, she denies any changes in her health or health history. She is not currently taking any blood thinners. She denies any history of heart attack or stroke.  INTERVAL HX She denies any changes in health or health history since we met in the office. No new medications/allergies. She states she is ready for surgery today.  PMH: UC (on Entyvio  now)  PSH: As above, otherwise, denies any prior anorectal procedures or surgeries  FHx: Denies any known family history of colorectal, breast, endometrial or ovarian cancer  Social Hx: Denies use of tobacco/EtOH/illicit drug. She is happily retired. 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 Helps care for a special needs grandchild who weighs approximately 40 pounds  Past Medical History:  Diagnosis Date   Anemia    Anginal pain (HCC)  Arthritis    Chest discomfort    Disorder of immune system (HCC)    Dyspnea    Dyspnea on exertion    Hypercholesterolemia    Hypertension    Migraine    Non-ulcer dyspepsia    Osteopenia of spine    Peptic ulcer disease    Pneumonia     Polyp of rectum    cancerous   Pre-diabetes    Skin cancer    Ulcerative colitis (HCC)     Past Surgical History:  Procedure Laterality Date   ANKLE FRACTURE SURGERY Left    CARDIAC CATHETERIZATION     COLONOSCOPY     ESOPHAGOGASTRODUODENOSCOPY     hip replacement Bilateral    LEFT HEART CATH AND CORONARY ANGIOGRAPHY N/A 01/21/2022   Procedure: LEFT HEART CATH AND CORONARY ANGIOGRAPHY;  Surgeon: Odie Benne, MD;  [facility];  Service: Cardiovascular;  Laterality: N/A;   RECTAL BIOPSY N/A 05/04/2022   Procedure: BIOPSY RECTAL VS EXCISION OF PERIANAL LESION UNDER ANOSCOPY;  Surgeon: Melvenia Stabs, MD;  Location: [facility];  Service: General;  Laterality: N/A;   TOTAL KNEE ARTHROPLASTY Left 10/19/2022   Procedure: LEFT TOTAL KNEE ARTHROPLASTY;  Surgeon: Wes Hamman, MD;  Location: [facility];  Service: Orthopedics;  Laterality: Left;   TUBAL LIGATION      Family History  Problem Relation Age of Onset   CAD Mother 81   Heart attack Mother    Heart attack Father 74   CAD Brother 30   Skin cancer Brother    Other Son        gluten sensitivity   Colon cancer Neg Hx    Esophageal cancer Neg Hx    Rectal cancer Neg Hx    Stomach cancer Neg Hx     Social:  reports that she has never smoked. She has never used smokeless tobacco. She reports that she does not drink alcohol and does not use drugs.  Allergies:  Allergies  Allergen Reactions   Codeine Nausea And Vomiting    Medications: I have reviewed the patient's current medications.  No results found for this or any previous visit (from the past 48 hours).  No results found.   PE Height 5\' 5"  (1.651 m), weight 77.6 kg. Constitutional: NAD; conversant Eyes: Moist conjunctiva; no lid lag; anicteric Lungs: Normal respiratory effort CV: RRR Psychiatric: Appropriate affect  No results found for this or any previous visit (from the past 48 hours).  No results found.  A/P: Andrea [name] is an 68 y.o. female with hx of longstanding UC now s/p EUA/transanal excision of perianal lesions/punch biopsy of perianal skin  Pathology showed 'superficially invasive' squamous cell carcinoma in background of HGSIL/AIN 3 on posterior midline which was removed; HGSIL/AIN 3 on punch biopsy of anal margin. Case reviewed with pathology and this is shallow and not involving any margin but AIN is present.  -At our last visit "She does have a remote history of abnormal Pap smears but is following with gynecology and states her more recent Pap smears have been normal. We discussed what HPV is and its etiology. We discussed the importance of safe sex practices. We discussed an increased lifetime risk of anal cancer. We also discussed the importance of surveillance.   -Completed Aldara previously"  Now with findings concerning for recurrence posteriorly  -The anatomy and physiology of the anal canal was discussed with the patient with associated pictures. The pathophysiology of anal lesions/HGSIL was  discussed at length with associated pictures and illustrations.  -We have reviewed options going forward and have recommended surgery-transanal excision of distal rectal lesion + perianal lesion; anorectal exam under anesthesia  -The planned procedure, material risks (including, but not limited to, pain, bleeding, infection, scarring, need for blood transfusion, damage to anal sphincter, incontinence of gas and/or stool, need for additional procedures, anal stenosis, rare cases of pelvic sepsis which in severe cases may require things like a colostomy, recurrence, pneumonia, heart attack, stroke, death) benefits and alternatives to surgery were discussed at length. Also discussed there is a staged approach may be necessary based on how much excision is planned. Based on what I am seeing today, I suspect we will be able to do this at a single procedure. The patient's questions were answered to her  satisfaction, she voiced understanding and elected to proceed with surgery. Additionally, we discussed typical postoperative expectations and the recovery process.   Beatris Lincoln, MD [facility], [facility]

## 2024-01-14 NOTE — Anesthesia Postprocedure Evaluation (Signed)
 Anesthesia Post Note  Patient: Andrea [name]  Procedure(s) Performed: TRANSRECTAL TUMOR EXCISION     Patient location during evaluation: PACU Anesthesia Type: General Level of consciousness: awake and alert Pain management: pain level controlled Vital Signs Assessment: post-procedure vital signs reviewed and stable Respiratory status: spontaneous breathing, nonlabored ventilation, respiratory function stable and patient connected to nasal cannula oxygen Cardiovascular status: blood pressure returned to baseline and stable Postop Assessment: no apparent nausea or vomiting Anesthetic complications: no  No notable events documented.  Last Vitals:  Vitals:   01/14/24 1600 01/14/24 1615  BP: (!) 142/51 (!) 155/56  Pulse: 98 96  Resp: 15 19  Temp:    SpO2: 100% 97%    Last Pain:  Vitals:   01/14/24 1600  TempSrc:   PainSc: 0-No pain                 Willian Harrow

## 2024-01-14 NOTE — Anesthesia Procedure Notes (Signed)
 Procedure Name: Intubation Date/Time: 01/14/2024 2:52 PM  Performed by: Glo Larch, CRNAPre-anesthesia Checklist: Patient identified, Emergency Drugs available, Suction available and Patient being monitored Patient Re-evaluated:Patient Re-evaluated prior to induction Oxygen Delivery Method: Circle system utilized Preoxygenation: Pre-oxygenation with 100% oxygen Induction Type: IV induction Ventilation: Mask ventilation without difficulty Laryngoscope Size: Mac and 3 Grade View: Grade I Tube type: Oral Tube size: 7.0 mm Number of attempts: 1 Airway Equipment and Method: Stylet and Oral airway Placement Confirmation: ETT inserted through vocal cords under direct vision, positive ETCO2 and breath sounds checked- equal and bilateral Secured at: 22 cm Tube secured with: Tape Dental Injury: Teeth and Oropharynx as per pre-operative assessment

## 2024-01-14 NOTE — Transfer of Care (Signed)
 Immediate Anesthesia Transfer of Care Note  Patient: Andrea [name]  Procedure(s) Performed: Procedure(s) (LRB): TRANSRECTAL TUMOR EXCISION (N/A)  Patient Location: PACU  Anesthesia Type: General  Level of Consciousness: awake, oriented, sedated and patient cooperative  Airway & Oxygen Therapy: Patient Spontanous Breathing and Patient connected to face mask oxygen  Post-op Assessment: Report given to PACU RN and Post -op Vital signs reviewed and stable  Post vital signs: Reviewed and stable  Complications: No apparent anesthesia complications  Last Vitals:  Vitals Value Taken Time  BP 170/65 01/14/24 1545  Temp    Pulse 103 01/14/24 1547  Resp 21 01/14/24 1547  SpO2 99 % 01/14/24 1547  Vitals shown include unfiled device data.  Last Pain:  Vitals:   01/14/24 1402  TempSrc: Temporal  PainSc: 0-No pain      Patients Stated Pain Goal: 3 (01/14/24 1402)  Complications: No notable events documented.

## 2024-01-14 NOTE — Anesthesia Preprocedure Evaluation (Addendum)
 Anesthesia Evaluation  Patient identified by MRN, date of birth, ID band Patient awake    Reviewed: Allergy & Precautions, NPO status , Patient's Chart, lab work & pertinent test results  Airway Mallampati: I  TM Distance: >3 FB Neck ROM: Full    Dental  (+) Teeth Intact, Dental Advisory Given   Pulmonary neg pulmonary ROS   Pulmonary exam normal        Cardiovascular hypertension, + angina   Rhythm:Regular Rate:Normal  L Heart Cath - Prox RCA lesion is 20% stenosed.   Prox LAD lesion is 20% stenosed.   The left ventricular systolic function is normal.   LV end diastolic pressure is normal.   The left ventricular ejection fraction is 55-65% by visual estimate.    Neuro/Psych  Headaches  negative psych ROS   GI/Hepatic Neg liver ROS, PUD,,,  Endo/Other  negative endocrine ROS    Renal/GU negative Renal ROS     Musculoskeletal  (+) Arthritis ,    Abdominal   Peds  Hematology  (+) Blood dyscrasia, anemia   Anesthesia Other Findings   Reproductive/Obstetrics                             Anesthesia Physical Anesthesia Plan  ASA: 2  Anesthesia Plan: General   Post-op Pain Management: Tylenol  PO (pre-op)* and Toradol  IV (intra-op)*   Induction: Intravenous  PONV Risk Score and Plan: 4 or greater and Ondansetron , Dexamethasone , Midazolam  and Scopolamine  patch - Pre-op  Airway Management Planned: Oral ETT  Additional Equipment: None  Intra-op Plan:   Post-operative Plan: Extubation in OR  Informed Consent: I have reviewed the patients History and Physical, chart, labs and discussed the procedure including the risks, benefits and alternatives for the proposed anesthesia with the patient or authorized representative who has indicated his/her understanding and acceptance.     Dental advisory given  Plan Discussed with: CRNA  Anesthesia Plan Comments:        Anesthesia  Quick Evaluation

## 2024-01-14 NOTE — Discharge Instructions (Addendum)
 ANORECTAL SURGERY: POST OP INSTRUCTIONS  DIET: Follow a light bland diet the first 24 hours after arrival home, such as soup, liquids, crackers, etc.  Be sure to include lots of fluids daily.  Avoid fast food or heavy meals as your are more likely to get nauseated.  Eat a low fat diet the next few days after surgery.   Some bleeding with bowel movements is expected for the first couple of days but this should stop in between bowel movements  Take your usually prescribed home medications unless otherwise directed. No foreign bodies per rectum for the next 3 months (enemas, etc)  PAIN CONTROL: It is helpful to take an over-the-counter pain medication regularly for the first few days/weeks.  Choose from the following that works best for you: Ibuprofen (Advil, etc) Three 200mg  tabs every 6 hours as needed. Acetaminophen  (Tylenol , etc) 500-650mg  every 6 hours as needed NOTE: You may take both of these medications together - most patients find it most helpful when alternating between the two (i.e. Ibuprofen at 6am, tylenol  at 9am, ibuprofen at 12pm ..Aaron Aas) A  prescription for pain medication may have been prescribed for you at discharge.  Take your pain medication as prescribed.  If you are having problems/concerns with the prescription medicine, please call us  for further advice.  Avoid getting constipated.  Between the surgery and the pain medications, it is common to experience some constipation.  Increasing fluid intake (64oz of water per day) and taking a fiber supplement (such as Metamucil, Citrucel, FiberCon) 1-2 times a day regularly will usually help prevent this problem from occurring.  Take Miralax (over the counter) 1-2x/day while taking a narcotic pain medication. If no bowel movement after 48hours, you may additionally take a laxative like a bottle of Milk of Magnesia which can be purchased over the counter. Avoid enemas.   Watch out for diarrhea.  If you have many loose bowel movements,  simplify your diet to bland foods.  Stop any stool softeners and decrease your fiber supplement. If this worsens or does not improve, please call us .  Wash / shower every day.  If you were discharged with a dressing, you may remove this the day after your surgery. You may shower normally, getting soap/water on your wound, particularly after bowel movements.  Soaking in a warm bath filled a couple inches ("Sitz bath") is a great way to clean the area after a bowel movement and many patients find it is a way to soothe the area.  ACTIVITIES as tolerated:   You may resume regular (light) daily activities beginning the next day--such as daily self-care, walking, climbing stairs--gradually increasing activities as tolerated.  If you can walk 30 minutes without difficulty, it is safe to try more intense activity such as jogging, treadmill, bicycling, low-impact aerobics, etc. Refrain from any heavy lifting or straining for the first 2 weeks after your procedure, particularly if your surgery was for hemorrhoids. Avoid activities that make your pain worse You may drive when you are no longer taking prescription pain medication, you can comfortably wear a seatbelt, and you can safely maneuver your car and apply brakes.  FOLLOW UP in our office Please call [facility] at [phone] to set up an appointment to see your surgeon in the office for a follow-up appointment approximately 2 weeks after your surgery. Make sure that you call for this appointment the day you arrive home to insure a convenient appointment time.  9. If you have disability or family leave forms  that need to be completed, you may have them completed by your primary care physician's office; for return to work instructions, please ask our office staff and they will be happy to assist you in obtaining this documentation   When to call us  [phone]: Poor pain control Reactions / problems with new medications (rash/itching, etc)  Fever over  101.5 F (38.5 C) Inability to urinate Nausea/vomiting Worsening swelling or bruising Continued bleeding from incision. Increased pain, redness, or drainage from the incision  The clinic staff is available to answer your questions during regular business hours (8:30am-5pm).  Please don't hesitate to call and ask to speak to one of our nurses for clinical concerns.   A surgeon from [facility] is always on call at the hospitals   If you have a medical emergency, go to the nearest emergency room or call 911.   [facility] [address], [place], [place]  [address] MAIN: [phone] FAX: [phone] [email or web address]   No Tylenol  before 8:10pm.   Post Anesthesia Home Care Instructions  Activity: Get plenty of rest for the remainder of the day. A responsible individual must stay with you for 24 hours following the procedure.  For the next 24 hours, DO NOT: -Drive a car -Advertising copywriter -Drink alcoholic beverages -Take any medication unless instructed by your physician -Make any legal decisions or sign important papers.  Meals: Start with liquid foods such as gelatin or soup. Progress to regular foods as tolerated. Avoid greasy, spicy, heavy foods. If nausea and/or vomiting occur, drink only clear liquids until the nausea and/or vomiting subsides. Call your physician if vomiting continues.  Special Instructions/Symptoms: Your throat may feel dry or sore from the anesthesia or the breathing tube placed in your throat during surgery. If this causes discomfort, gargle with warm salt water. The discomfort should disappear within 24 hours.  If you had a scopolamine  patch placed behind your ear for the management of post- operative nausea and/or vomiting:  1. The medication in the patch is effective for 72 hours, after which it should be removed.  Wrap patch in a tissue and discard in the trash. Wash  hands thoroughly with soap and water. 2. You may remove the patch earlier than 72 hours if you experience unpleasant side effects which may include dry mouth, dizziness or visual disturbances. 3. Avoid touching the patch. Wash your hands with soap and water after contact with the patch.

## 2024-01-14 NOTE — Op Note (Signed)
 01/14/2024  3:25 PM  PATIENT:  Andrea [name]  68 y.o. female  Patient Care Team: Alejandro Hurt, FNP as PCP - General (Family Medicine) Jacqueline Matsu, MD as PCP - Cardiology (Cardiology)  PRE-OPERATIVE DIAGNOSIS:  Anal lesion, history of HGSIL, superficially invasive squamous cell carcinoma  POST-OPERATIVE DIAGNOSIS:  Same  PROCEDURE:   Punch biopsy of perianal lesion x 4 Anorectal exam under anesthesia  SURGEON:  Surgeon(s): Melvenia Stabs, MD  ASSISTANT: OR Staff   ANESTHESIA:   local and general  SPECIMEN:   Right posterior perianal lesion (x2) Left posterior perianal lesion (x2)  DISPOSITION OF SPECIMEN:  PATHOLOGY  COUNTS:  Sponge, needle, and instrument counts were reported correct x2 at conclusion.  EBL: 5 mL  Drains: None  PLAN OF CARE: Discharge to home after PACU  PATIENT DISPOSITION:  PACU - hemodynamically stable.  OR FINDINGS: Posteriorly based anal lesion, friable, glistening/pink (see photo), sharp demarcation. Extends into anal canal to the level of dentate. 2 firm areas - right posterior and left posterior - punch biopsies obtained to rule out underlying squamous cell carcinoma.  DESCRIPTION: The patient was identified in the preoperative holding area and taken to the OR. SCDs were applied. She then underwent general endotracheal anesthesia without difficulty. The patient was then rolled onto the OR table in the prone jackknife position. Pressure points were then evaluated and padded. Benzoin was applied to the buttocks and they were gently taped apart.  She was then prepped and draped in usual sterile fashion.  A surgical timeout was performed indicating the correct patient, procedure, and positioning.  A perianal block was then created using a dilute mixture of 0.25% Marcaine  with epinephrine  and Exparel .  After ascertaining an appropriate level of anesthesia had been achieved, a well lubricated digital rectal exam was performed. This  demonstrated firmness in posterior anal canal - right and left side.   Externally there is a hemicircumferntial lesion that is glistening and friable.   A Hill-Ferguson anoscope was into the anal canal and circumferential inspection demonstrated this extends to the dentate posteriorly. There are 2 firm areas in the right and left posterior positions. This is worrisome for potential recurrence and/or anal squamous cell carcinoma. For further characterization as this may certainly impact treatment, we opted to proceed with multiple punch biopsies. A 4 mm punch biopsy tool was brought onto the field. The firmest area is in the right posterior position. Two separate punch biopsies are obtained and submitted for pathology. Hemostasis was achieved with electrocautery. Attention turned to the right posterior - this was punch biopsied in a similar manner x2 and submitted for pathologic evaluation. Hemostasis was achieved with electrocautery.  Additional local anesthetic was infiltrated at the punch biopsy sites.  All sponge, needle, and instrument counts were reported correct.  The area was washed and dried.  A piece of Surgifoam is placed within the anal canal.  The buttocks are untaped.  A dressing consisting of 4 x 4's, ABD, mesh underwear is ultimately placed.  She is rolled back onto a stretcher, awakened from anesthesia, extubated, and transported to the recovery room in satisfactory condition.  DISPOSITION: PACU in satisfactory condition.

## 2024-01-17 ENCOUNTER — Encounter (HOSPITAL_BASED_OUTPATIENT_CLINIC_OR_DEPARTMENT_OTHER): Payer: Self-pay | Admitting: Surgery

## 2024-01-18 LAB — SURGICAL PATHOLOGY

## 2024-01-19 ENCOUNTER — Other Ambulatory Visit: Payer: Self-pay

## 2024-01-19 ENCOUNTER — Ambulatory Visit: Admitting: Nurse Practitioner

## 2024-01-19 ENCOUNTER — Other Ambulatory Visit

## 2024-01-19 DIAGNOSIS — Z85048 Personal history of other malignant neoplasm of rectum, rectosigmoid junction, and anus: Secondary | ICD-10-CM

## 2024-01-19 NOTE — Progress Notes (Signed)
 PATIENT NAVIGATOR PROGRESS NOTE  Name: [name] Date: 01/19/2024 MRN: [id number]  DOB: 08-Sep-1956   Reason for visit:  Introductory Phone Call  Comments:  Called patient regarding referral to our office.  Introduced self to patient and gave her my direct contact information. Patient scheduled for New Patient Appt on 5/1 with Rande Bushy, NP.  Will follow-up with patient at that visit.    Time spent counseling/coordinating care: 30-45 minutes

## 2024-01-20 ENCOUNTER — Inpatient Hospital Stay

## 2024-01-20 ENCOUNTER — Inpatient Hospital Stay: Admitting: Licensed Clinical Social Worker

## 2024-01-20 ENCOUNTER — Other Ambulatory Visit: Payer: Self-pay | Admitting: Nurse Practitioner

## 2024-01-20 ENCOUNTER — Inpatient Hospital Stay (HOSPITAL_BASED_OUTPATIENT_CLINIC_OR_DEPARTMENT_OTHER): Admitting: Nurse Practitioner

## 2024-01-20 VITALS — BP 128/60 | HR 80 | Temp 98.7°F | Resp 20 | Ht 65.0 in | Wt 169.7 lb

## 2024-01-20 DIAGNOSIS — C210 Malignant neoplasm of anus, unspecified: Secondary | ICD-10-CM | POA: Diagnosis present

## 2024-01-20 DIAGNOSIS — R197 Diarrhea, unspecified: Secondary | ICD-10-CM | POA: Insufficient documentation

## 2024-01-20 DIAGNOSIS — K6289 Other specified diseases of anus and rectum: Secondary | ICD-10-CM

## 2024-01-20 DIAGNOSIS — R5383 Other fatigue: Secondary | ICD-10-CM | POA: Diagnosis not present

## 2024-01-20 DIAGNOSIS — Z79899 Other long term (current) drug therapy: Secondary | ICD-10-CM | POA: Insufficient documentation

## 2024-01-20 DIAGNOSIS — R110 Nausea: Secondary | ICD-10-CM | POA: Insufficient documentation

## 2024-01-20 DIAGNOSIS — Z85048 Personal history of other malignant neoplasm of rectum, rectosigmoid junction, and anus: Secondary | ICD-10-CM

## 2024-01-20 DIAGNOSIS — Z5111 Encounter for antineoplastic chemotherapy: Secondary | ICD-10-CM | POA: Diagnosis not present

## 2024-01-20 DIAGNOSIS — Z510 Encounter for antineoplastic radiation therapy: Secondary | ICD-10-CM | POA: Diagnosis present

## 2024-01-20 DIAGNOSIS — C211 Malignant neoplasm of anal canal: Secondary | ICD-10-CM | POA: Insufficient documentation

## 2024-01-20 DIAGNOSIS — K6389 Other specified diseases of intestine: Secondary | ICD-10-CM | POA: Insufficient documentation

## 2024-01-20 DIAGNOSIS — Z114 Encounter for screening for human immunodeficiency virus [HIV]: Secondary | ICD-10-CM

## 2024-01-20 LAB — CMP (CANCER CENTER ONLY)
ALT: 19 U/L (ref 0–44)
AST: 22 U/L (ref 15–41)
Albumin: 4.3 g/dL (ref 3.5–5.0)
Alkaline Phosphatase: 61 U/L (ref 38–126)
Anion gap: 5 (ref 5–15)
BUN: 23 mg/dL (ref 8–23)
CO2: 27 mmol/L (ref 22–32)
Calcium: 10 mg/dL (ref 8.9–10.3)
Chloride: 108 mmol/L (ref 98–111)
Creatinine: 1.05 mg/dL — ABNORMAL HIGH (ref 0.44–1.00)
GFR, Estimated: 58 mL/min — ABNORMAL LOW (ref >60)
Glucose, Bld: 93 mg/dL (ref 70–99)
Potassium: 4.2 mmol/L (ref 3.5–5.1)
Sodium: 140 mmol/L (ref 135–145)
Total Bilirubin: 0.6 mg/dL (ref 0.0–1.2)
Total Protein: 7.4 g/dL (ref 6.5–8.1)

## 2024-01-20 LAB — CBC WITH DIFFERENTIAL (CANCER CENTER ONLY)
Abs Immature Granulocytes: 0.01 10*3/uL (ref 0.00–0.07)
Basophils Absolute: 0 10*3/uL (ref 0.0–0.1)
Basophils Relative: 1 %
Eosinophils Absolute: 0.1 10*3/uL (ref 0.0–0.5)
Eosinophils Relative: 1 %
HCT: 33 % — ABNORMAL LOW (ref 36.0–46.0)
Hemoglobin: 11.2 g/dL — ABNORMAL LOW (ref 12.0–15.0)
Immature Granulocytes: 0 %
Lymphocytes Relative: 30 %
Lymphs Abs: 1.8 10*3/uL (ref 0.7–4.0)
MCH: 28.6 pg (ref 26.0–34.0)
MCHC: 33.9 g/dL (ref 30.0–36.0)
MCV: 84.2 fL (ref 80.0–100.0)
Monocytes Absolute: 0.6 10*3/uL (ref 0.1–1.0)
Monocytes Relative: 11 %
Neutro Abs: 3.5 10*3/uL (ref 1.7–7.7)
Neutrophils Relative %: 57 %
Platelet Count: 250 10*3/uL (ref 150–400)
RBC: 3.92 MIL/uL (ref 3.87–5.11)
RDW: 13.7 % (ref 11.5–15.5)
WBC Count: 6 10*3/uL (ref 4.0–10.5)
nRBC: 0 % (ref 0.0–0.2)

## 2024-01-20 NOTE — Assessment & Plan Note (Signed)
 Patient was treated in 2023 for superficial squamous cell carcinoma and HGSIL of anal canal. She presented to her surgeon in 12/2023, after having several months of anal itching and irritation. Anoscopy done per Dr. Camilo Cella, surgeon, demonstrated extension of tumor within the anal canal up to potentially just above the dentate line to the distal rectal area. This is all posteriorly based.  Biopsies of the area were taken at the time of anoscopy.  A. PERIANAL, RIGHT POSTERIOR, BIOPSY:  Invasive squamous cell carcinoma, moderately differentiated.Suspicious for lymphovascular invasion.  B. PERIANAL, LEFT POSTERIOR, BIOPSY:       High-grade squamous intraepithelial lesion (HSIL / AIN 3).       No evidence of definitive invasion.  Will get PET scan for staging workup. Set up consultation with Dr. Jeryl Moris, radiation oncologist. Refer for PT, pelvic floor, for after treatment with concurrent chemoradiation. Consult with social work and nutrition. Schedule chemotherapy education. Long discussion with patient and her family about concurrent chemo radiation.  This is the most effective treatment for invasive anal cancer.  Treatment consists of chemotherapy, mitomycin and 5-FU, on weeks 1 and 5 of treatment.  Radiation is Monday through Friday for 5.5 weeks.  Chemotherapy consent: Side effects of chemotherapy include, but are not limited to, fatigue, nausea, vomiting, diarrhea, hair loss, neuropathy, fluid retention, renal and kidney dysfunction, neutropenic fever, need for blood transfusion, and bleeding, were discussed with patient in great detail.  Andrea [name] agrees to proceed with treatment.

## 2024-01-20 NOTE — Progress Notes (Addendum)
 [facility]  [phone]   HEMATOLOGY ONCOLOGY CONSULTATION   Andrea [name]  DOB: 29-Apr-1956  MR#: [id number]  CSN#: [id number]    Patient Care Team: Alejandro Hurt, FNP as PCP - General (Family Medicine) Jacqueline Matsu, MD as PCP - Cardiology (Cardiology) Rhetta Cellar, RN as Oncology Nurse Navigator  Reason for consult: recurrent anal cancer   History of present illness:   Patient was treated in 2023 for superficial squamous cell carcinoma and HGSIL of anal canal. She presented to her surgeon in 12/2023, after having several months of anal itching and irritation. Anoscopy done per Dr. Camilo Cella, surgeon, demonstrated extension of tumor within the anal canal up to potentially just above the dentate line to the distal rectal area. This is all posteriorly based.  Biopsies of the area were taken at the time of anoscopy.  A. PERIANAL, RIGHT POSTERIOR, BIOPSY:  Invasive squamous cell carcinoma, moderately differentiated.Suspicious for lymphovascular invasion.  B. PERIANAL, LEFT POSTERIOR, BIOPSY:       High-grade squamous intraepithelial lesion (HSIL / AIN 3).       No evidence of definitive invasion.  She has continued complaint of anal itching and irritation. There is no change in the symptoms since she saw Dr. Camilo Cella 12/21/2023. She does have history of ulcerative colitis. She follows up with her GI provider on routine basis. She is on Entyvio  for this. It is generally well controlled. She denies chest pain, chest pressure, or shortness of breath. She denies unusual headaches or visual disturbances. She denies abdominal pain, nausea, vomiting, or changes in bowel or bladder habits.   Socially, the patient is divorced.  She lives alone.  Adult children live close by.  She is retired.  She helps to take care of her grandchild.  She denies smoking or smoking history.  She does not drink alcohol.  She does not use illegal or illicit drugs.  She is not currently sexually  active.  MEDICAL HISTORY:  Past Medical History:  Diagnosis Date   Anemia    Anginal pain (HCC)    Arthritis    Chest discomfort    Disorder of immune system (HCC)    Dyspnea    Dyspnea on exertion    Hypercholesterolemia    Hypertension    Migraine    Non-ulcer dyspepsia    Osteopenia of spine    Peptic ulcer disease    Pneumonia    Polyp of rectum    cancerous   Pre-diabetes    Skin cancer    Ulcerative colitis (HCC)     SURGICAL HISTORY: Past Surgical History:  Procedure Laterality Date   ANKLE FRACTURE SURGERY Left    CARDIAC CATHETERIZATION     COLONOSCOPY     ESOPHAGOGASTRODUODENOSCOPY     hip replacement Bilateral    LEFT HEART CATH AND CORONARY ANGIOGRAPHY N/A 01/21/2022   Procedure: LEFT HEART CATH AND CORONARY ANGIOGRAPHY;  Surgeon: Odie Benne, MD;  Location: [facility];  Service: Cardiovascular;  Laterality: N/A;   RECTAL BIOPSY N/A 05/04/2022   Procedure: BIOPSY RECTAL VS EXCISION OF PERIANAL LESION UNDER ANOSCOPY;  Surgeon: Melvenia Stabs, MD;  Location: [facility];  Service: General;  Laterality: N/A;   TOTAL KNEE ARTHROPLASTY Left 10/19/2022   Procedure: LEFT TOTAL KNEE ARTHROPLASTY;  Surgeon: Wes Hamman, MD;  Location: [facility];  Service: Orthopedics;  Laterality: Left;   TUBAL LIGATION     TUMOR EXCISION N/A 01/14/2024   Procedure: TRANSRECTAL  TUMOR EXCISION;  Surgeon: Melvenia Stabs, MD;  Location: [facility];  Service: General;  Laterality: N/A;  Transanal excision of distal rectal and anal lesion (wide local)    SOCIAL HISTORY: Social History   Socioeconomic History   Marital status: Divorced    Spouse name: Not on file   Number of children: 3   Years of education: Not on file   Highest education level: Not on file  Occupational History   Occupation: retired  Tobacco Use   Smoking status: Never   Smokeless tobacco: Never  Vaping Use   Vaping status: Never Used  Substance and Sexual Activity    Alcohol use: No   Drug use: No   Sexual activity: Yes  Other Topics Concern   Not on file  Social History Narrative   Not on file   Social Drivers of Health   Financial Resource Strain: Not on file  Food Insecurity: No Food Insecurity (01/20/2024)   Hunger Vital Sign    Worried About Running Out of Food in the Last Year: Never true    Ran Out of Food in the Last Year: Never true  Transportation Needs: No Transportation Needs (01/20/2024)   PRAPARE - Administrator, Civil Service (Medical): No    Lack of Transportation (Non-Medical): No  Physical Activity: Not on file  Stress: Not on file  Social Connections: Unknown (01/26/2022)   Received from [facility]   Social Network    Social Network: Not on file  Intimate Partner Violence: Not At Risk (01/20/2024)   Humiliation, Afraid, Rape, and Kick questionnaire    Fear of Current or Ex-Partner: No    Emotionally Abused: No    Physically Abused: No    Sexually Abused: No    FAMILY HISTORY: Family History  Problem Relation Age of Onset   CAD Mother 54   Heart attack Mother    Heart attack Father 70   CAD Brother 21   Skin cancer Brother    Other Son        gluten sensitivity   Colon cancer Neg Hx    Esophageal cancer Neg Hx    Rectal cancer Neg Hx    Stomach cancer Neg Hx     ALLERGIES:  is allergic to codeine.  MEDICATIONS:  Current Outpatient Medications  Medication Sig Dispense Refill   acetaminophen  (TYLENOL ) 500 MG tablet Take 1,000 mg by mouth every 6 (six) hours as needed for moderate pain.     albuterol  (VENTOLIN  HFA) 108 (90 Base) MCG/ACT inhaler Inhale 1-2 puffs into the lungs every 4 (four) hours as needed for wheezing or shortness of breath. 8 g 0   ammonium lactate (LAC-HYDRIN) 12 % lotion Apply 1 Application topically daily.     atorvastatin  (LIPITOR) 80 MG tablet TAKE ONE TABLET BY MOUTH DAILY (Patient taking differently: Take 80 mg by mouth daily. Take 1/2 tablet daily) 90 tablet 1    ezetimibe  (ZETIA ) 10 MG tablet TAKE 1 TABLET BY MOUTH DAILY 90 tablet 3   fluticasone  (FLONASE ) 50 MCG/ACT nasal spray Place 2 sprays into both nostrils daily. (Patient taking differently: Place 2 sprays into both nostrils daily as needed for allergies.) 1 g 0   hydrocortisone  (ANUSOL -HC) 2.5 % rectal cream Place 1 Application rectally 2 (two) times daily. 30 g 1   losartan  (COZAAR ) 25 MG tablet Take 25 mg by mouth daily.     PARoxetine (PAXIL) 20 MG tablet Take 10 mg by  mouth daily.     SUMAtriptan (IMITREX) 25 MG tablet Take 25 mg by mouth every 2 (two) hours as needed.     triamterene-hydrochlorothiazide (MAXZIDE-25) 37.5-25 MG tablet Take 1 tablet by mouth daily.     vedolizumab  (ENTYVIO ) 300 MG injection Inject 300 mg into the vein every 8 (eight) weeks.     nitroGLYCERIN  (NITROSTAT ) 0.4 MG SL tablet Place 1 tablet (0.4 mg total) under the tongue every 5 (five) minutes as needed for chest pain. (Patient not taking: Reported on 01/20/2024) 25 tablet 2   No current facility-administered medications for this visit.    REVIEW OF SYSTEMS:   Constitutional: Denies fevers, chills or abnormal night sweats Eyes: Denies blurriness of vision, double vision or watery eyes Ears, nose, mouth, throat, and face: Denies mucositis or sore throat Respiratory: Denies cough, dyspnea or wheezes Cardiovascular: Denies palpitation, chest discomfort or lower extremity swelling Gastrointestinal:  Denies nausea, heartburn or change in bowel habits.  Does have anal irritation and itching. Skin: Denies abnormal skin rashes Lymphatics: Denies new lymphadenopathy or easy bruising Neurological:Denies numbness, tingling or new weaknesses Behavioral/Psych: Mood is stable, no new changes  All other systems were reviewed with the patient and are negative.  PHYSICAL EXAMINATION: ECOG PERFORMANCE STATUS: 1 - Symptomatic but completely ambulatory  Vitals:   01/20/24 1259  BP: 128/60  Pulse: 80  Resp: 20  Temp: 98.7  F (37.1 C)  SpO2: 97%   Filed Weights   01/20/24 1259  Weight: 169 lb 11.2 oz (77 kg)    GENERAL:alert, no distress and comfortable SKIN: skin color, texture, turgor are normal, no rashes or significant lesions EYES: normal, conjunctiva are pink and non-injected, sclera clear OROPHARYNX:no exudate, no erythema and lips, buccal mucosa, and tongue normal  NECK: supple, thyroid normal size, non-tender, without nodularity LYMPH:  no palpable lymphadenopathy in the cervical, axillary or inguinal LUNGS: clear to auscultation and percussion with normal breathing effort HEART: regular rate & rhythm and no murmurs and no lower extremity edema ABDOMEN:abdomen soft, non-tender and normal bowel sounds Musculoskeletal:no cyanosis of digits and no clubbing  PSYCH: alert & oriented x 3 with fluent speech NEURO: no focal motor/sensory deficits ANAL: DRE performed during today's visit.  There is moist, white-colored mucosa to external anus.  There is a palpable area of firmness which stretches about 3 cm into anal canal along the posterior aspect.  Slight redness of the skin surrounding the anus.  Slight tenderness with exam.  LABORATORY DATA:  I have reviewed the data as listed Lab Results  Component Value Date   WBC 6.0 01/20/2024   HGB 11.2 (L) 01/20/2024   HCT 33.0 (L) 01/20/2024   MCV 84.2 01/20/2024   PLT 250 01/20/2024   Recent Labs    01/11/24 0754 01/20/24 1450  NA 139 140  K 4.2 4.2  CL 108 108  CO2 22 27  GLUCOSE 94 93  BUN 18 23  CREATININE 0.80 1.05*  CALCIUM  9.5 10.0  GFRNONAA >60 58*  PROT  --  7.4  ALBUMIN  --  4.3  AST  --  22  ALT  --  19  ALKPHOS  --  61  BILITOT  --  0.6    RADIOGRAPHIC STUDIES: MM 3D SCREENING MAMMOGRAM BILATERAL BREAST Result Date: 12/28/2023 CLINICAL DATA:  Screening. EXAM: DIGITAL SCREENING BILATERAL MAMMOGRAM WITH TOMOSYNTHESIS AND CAD TECHNIQUE: Bilateral screening digital craniocaudal and mediolateral oblique mammograms were obtained.  Bilateral screening digital breast tomosynthesis was performed. The images were evaluated with  computer-aided detection. COMPARISON:  Previous exam(s). ACR Breast Density Category b: There are scattered areas of fibroglandular density. FINDINGS: There are no findings suspicious for malignancy. IMPRESSION: No mammographic evidence of malignancy. A result letter of this screening mammogram will be mailed directly to the patient. RECOMMENDATION: Screening mammogram in one year. (Code:SM-B-01Y) BI-RADS CATEGORY  1: Negative. Electronically Signed   By: Sundra Engel M.D.   On: 12/28/2023 16:24    ASSESSMENT & PLAN:  Anal cancer [facility]) Assessment & Plan: Patient was treated in 2023 for superficial squamous cell carcinoma and HGSIL of anal canal. She presented to her surgeon in 12/2023, after having several months of anal itching and irritation. Anoscopy done per Dr. Camilo Cella, surgeon, demonstrated extension of tumor within the anal canal up to potentially just above the dentate line to the distal rectal area. This is all posteriorly based.  Biopsies of the area were taken at the time of anoscopy.  A. PERIANAL, RIGHT POSTERIOR, BIOPSY:  Invasive squamous cell carcinoma, moderately differentiated.Suspicious for lymphovascular invasion.  B. PERIANAL, LEFT POSTERIOR, BIOPSY:       High-grade squamous intraepithelial lesion (HSIL / AIN 3).       No evidence of definitive invasion.  Will get PET scan for staging workup. Set up consultation with Dr. Jeryl Moris, radiation oncologist. Refer for PT, pelvic floor, for after treatment with concurrent chemoradiation. Consult with social work and nutrition. Schedule chemotherapy education. Long discussion with patient and her family about concurrent chemo radiation.  This is the most effective treatment for invasive anal cancer.  Treatment consists of chemotherapy, mitomycin and 5-FU, on weeks 1 and 5 of treatment.  Radiation is Monday through Friday for 5.5 weeks.  Chemotherapy  consent: Side effects of chemotherapy include, but are not limited to, fatigue, nausea, vomiting, diarrhea, hair loss, neuropathy, fluid retention, renal and kidney dysfunction, neutropenic fever, need for blood transfusion, and bleeding, were discussed with patient in great detail.  Ms. Lirette agrees to proceed with treatment.    Orders: -     NM PET Image Initial (PI) Skull Base To Thigh (F-18 FDG); Future  The patient was seen along with Dr. Maryalice [name] today. All questions were answered. The patient knows to call the clinic with any problems, questions or concerns.      Andrea Deis, NP 01/20/2024 4:07 PM  Addendum I have seen the patient, examined her. I agree with the assessment and and plan and have edited the notes.   68 year old female with rectosigmoiditis on Entyvio , and previous history of superficially invasive squamous cell carcinoma of anus, status post surgical resection in August 2023, now presented with rectal discomfort for several months, and a palpable 2 to 3 cm mass in the anal canal, biopsy confirmed invasive squamous cell carcinoma.  Will obtain PET scan for staging, we discussed concurrent chemoradiation for anal cancer with curative intent, to avoid APL and permanent ostomy.  Chemo consent obtained.  She has good social support from her sons and daughter-in-law.  Will make a urgent referral to radiation oncology Dr. Jeryl Moris, plan to start treatment in about 2 weeks.  Will refer her to social work, dietitian, and physical therapy.  All questions were answered, I spent a total 60 minutes for her visit today, more than 50% time on face-to-face counseling.  Sonja Hammond MD 01/20/2024

## 2024-01-20 NOTE — Progress Notes (Signed)
 [facility] Clinical Social Work  Initial Assessment   Andrea [name] is a 68 y.o. year old female accompanied by patient and son. Clinical Social Work was referred by medical provider for assessment of psychosocial needs.   SDOH (Social Determinants of Health) assessments performed: Yes   SDOH Screenings   Food Insecurity: No Food Insecurity (10/20/2022)  Housing: Unknown (12/21/2023)   Received from [facility]  Transportation Needs: No Transportation Needs (10/20/2022)  Utilities: Not At Risk (10/20/2022)  Depression (PHQ2-9): Low Risk  (11/15/2023)  Social Connections: Unknown (01/26/2022)   Received from [facility]  Tobacco Use: Low Risk  (01/14/2024)     Distress Screen completed: No     No data to display            Family/Social Information:  Housing Arrangement: patient lives alone Family members/support persons in your life? Pt has 3 sons who reside close enough to be able to provide support as needed.   Transportation concerns: no  Employment: Retired .  Income source: Actor concerns: No Type of concern: None Food access concerns: no Religious or spiritual practice: Yes-[employer] Advanced directives: Not known Services Currently in place:  none  Coping/ Adjustment to diagnosis: Patient understands treatment plan and what happens next? yes Concerns about diagnosis and/or treatment: Overwhelmed by information and Quality of life Patient reported stressors: Adjusting to my illness Hopes and/or priorities: pt's priority is to start treatment w/ the hope of positive results Patient enjoys time with family/ friends Current coping skills/ strengths: Capable of independent living , Motivation for treatment/growth , and Physical Health     SUMMARY: Current SDOH Barriers:  No barriers identified at this time  Clinical Social Work Clinical Goal(s):  No clinical social work goals at this  time  Interventions: Discussed common feeling and emotions when being diagnosed with cancer, and the importance of support during treatment Informed patient of the support team roles and support services at [facility] Provided CSW contact information and encouraged patient to call with any questions or concerns    Follow Up Plan: Patient will contact CSW with any support or resource needs Patient verbalizes understanding of plan: Yes    Quenton Bruns, LCSW Clinical Social Worker [facility]

## 2024-01-20 NOTE — Progress Notes (Signed)
 PATIENT NAVIGATOR PROGRESS NOTE  Name: Andrea [name] Date: 01/20/2024 MRN: [id number]  DOB: Dec 19, 1955   Reason for visit:  New Patient Med Onc Appt with Rande Bushy, NP and Dr. Maryalice Smaller  Comments:   Met with patient during her new patient appointment.  Patient given Journey's binder with information specific to her diagnosis. Patient present with her son and daughter-in-law.  Needs assessment completed.  Patient has family support and is concerned about upcoming vacation.    The following referrals were made:  Spiritual Care Social Work  Will follow-up with patient for referrals for pelvic floor PT and Nutrition.    Patient was given my contact information and was instructed to contact office if she had any questions or concerns. Patient verbalized understanding.    Will follow-up with patient next week.    Time spent counseling/coordinating care: > 60 minutes

## 2024-01-21 ENCOUNTER — Other Ambulatory Visit: Payer: Self-pay

## 2024-01-21 ENCOUNTER — Encounter: Payer: Self-pay | Admitting: Hematology

## 2024-01-21 ENCOUNTER — Telehealth: Payer: Self-pay | Admitting: Radiation Oncology

## 2024-01-21 ENCOUNTER — Encounter (HOSPITAL_COMMUNITY)

## 2024-01-21 ENCOUNTER — Encounter: Payer: Self-pay | Admitting: Gastroenterology

## 2024-01-21 ENCOUNTER — Encounter: Payer: Self-pay | Admitting: General Practice

## 2024-01-21 ENCOUNTER — Telehealth: Payer: Self-pay | Admitting: Hematology

## 2024-01-21 DIAGNOSIS — C210 Malignant neoplasm of anus, unspecified: Secondary | ICD-10-CM

## 2024-01-21 LAB — CEA (ACCESS): CEA (CHCC): 3.61 ng/mL (ref 0.00–5.00)

## 2024-01-21 NOTE — Progress Notes (Signed)
START ON PATHWAY REGIMEN - Anal Carcinoma     One cycle, concurrent with RT:     Fluorouracil      Mitomycin   **Always confirm dose/schedule in your pharmacy ordering system**  Patient Characteristics: Anal Canal Tumors, Newly Diagnosed - Locoregional Disease (Clinical Staging) Therapeutic Status: Newly Diagnosed - Locoregional Disease (Clinical Staging) AJCC T Category: cT2 AJCC N Category: cN0 AJCC M Category: cM0 AJCC 9 Stage Grouping: IIA Check here if patient was staged using an edition other than AJCC Staging 9th Edition: false Intent of Therapy: Curative Intent, Discussed with Patient

## 2024-01-21 NOTE — Telephone Encounter (Signed)
 Spoke to pt to schedule CON/SIM with Dr. Jeryl Moris next available. Pt agreed to 5/6 appts.

## 2024-01-21 NOTE — Progress Notes (Signed)
 [facility] Spiritual Care Note  Referred by nursing for additional layer of support. Reached [name] by phone to introduce Spiritual Care as part of her support team. At this time she describes herself as "numb"; we talked about how numbness is a common early feeling and often the brain's way of protecting us  as we take care of initial logistical needs. Ensured that she has direct Spiritual Care number for follow-up as other feelings potentially arise later in her treatment process.  Provided compassionate presence, reflective listening, and normalization of both feelings/emotional process and utilizing support resources. [name] may reach out as desired.   [address] Andrea [name], [place], [facility] Pager [phone] Voicemail [phone]

## 2024-01-22 ENCOUNTER — Other Ambulatory Visit: Payer: Self-pay

## 2024-01-24 ENCOUNTER — Other Ambulatory Visit: Payer: Self-pay

## 2024-01-24 ENCOUNTER — Encounter: Payer: Self-pay | Admitting: Hematology

## 2024-01-24 DIAGNOSIS — C210 Malignant neoplasm of anus, unspecified: Secondary | ICD-10-CM

## 2024-01-24 MED ORDER — LIDOCAINE-PRILOCAINE 2.5-2.5 % EX CREA: TOPICAL_CREAM | CUTANEOUS | 3 refills | Status: DC

## 2024-01-24 MED ORDER — PROCHLORPERAZINE MALEATE 10 MG PO TABS: 10.0000 mg | ORAL_TABLET | Freq: Four times a day (QID) | ORAL | 1 refills | Status: DC | PRN

## 2024-01-24 MED ORDER — ONDANSETRON HCL 8 MG PO TABS: 8.0000 mg | ORAL_TABLET | Freq: Three times a day (TID) | ORAL | 1 refills | Status: DC | PRN

## 2024-01-24 NOTE — Addendum Note (Signed)
 Addended by: Sonja Duenweg on: 01/24/2024 07:31 PM   Modules accepted: Orders

## 2024-01-24 NOTE — Progress Notes (Signed)
 Pharmacist Chemotherapy Monitoring - Initial Assessment    Anticipated start date: 01/31/24   The following has been reviewed per standard work regarding the patient's treatment regimen: The patient's diagnosis, treatment plan and drug doses, and organ/hematologic function Lab orders and baseline tests specific to treatment regimen  The treatment plan start date, drug sequencing, and pre-medications Prior authorization status  Patient's documented medication list, including drug-drug interaction screen and prescriptions for anti-emetics and supportive care specific to the treatment regimen The drug concentrations, fluid compatibility, administration routes, and timing of the medications to be used The patient's access for treatment and lifetime cumulative dose history, if applicable  The patient's medication allergies and previous infusion related reactions, if applicable   Changes made to treatment plan:  N/A  Follow up needed:  PICC scheduled on 01/28/24   Cherylynn Cosier, [facility], 01/24/2024  12:42 PM

## 2024-01-24 NOTE — Progress Notes (Signed)
 Radiation Oncology         [phone] ________________________________  Name: Andrea [name]        MRN: [id number]  Date of Service: 01/25/2024 DOB: Apr 30, 1956  NG:EXBMW, Liane Redman, FNP  Melvenia Stabs, MD     REFERRING PHYSICIAN: Melvenia Stabs, MD   DIAGNOSIS: There were no encounter diagnoses.   HISTORY OF PRESENT ILLNESS: Andrea [name] is a 68 y.o. female seen at the request of Dr. Camilo Cella for a new diagnosis of anal carcinoma. The patient has a history of noninvasive dysplasia of the anus.  She has a history of ulcerative colitis and is followed by Dr. Dominic Friendly, and uses Remicade  and azathioprine.  She was originally diagnosed with her ulcerative colitis in March 2005.  She was sent to Dr. Camilo Cella in the spring 2023 due to irritation like changes in the anal region.  These abnormalities were treated with transanal excision under endoscopic guidance on 05/04/2022 as well as punch biopsy of perianal skin.  She had a 1 cm plaque like anal lesion under anoscopy which was fully excised, and the pathology showed superficially invasive squamous cell carcinoma in the background of extensive high-grade squamous intraepithelial lesion, and left lateral biopsy showed high-grade squamous intraepithelial lesion/anal intraepithelial neoplasia.  She was treated with Aldara she returned in November 2023 and was asked to return in 6 months time but returned to Dr. Camilo Cella in April 2025 with 3 to 4 months of perianal itching and occasional blood when wiping.  She was switched from Remicade  to Entyvio .  She was taken to the OR for further evaluation on 01/14/2024, and a broadly based anal lesion that was friable was noted, and biopsies of the right posterior showed invasive squamous cell carcinoma, moderately differentiated with suspicious findings for lymphovascular invasion, as well as high-grade squamous intraepithelial lesion in the left posterior biopsy.  Given these findings her case has been presented to  multidisciplinary GI oncology team members, and she has met with Dr. Maryalice Smaller to discuss definitive chemoradiation and will undergo PET scan this afternoon.  She is seen to discuss the radiotherapy component and is also scheduled for PICC line placement on Friday.     PREVIOUS RADIATION THERAPY: {EXAM; YES/NO:19492::"No"}   PAST MEDICAL HISTORY:  Past Medical History:  Diagnosis Date   Anemia    Anginal pain (HCC)    Arthritis    Chest discomfort    Disorder of immune system (HCC)    Dyspnea    Dyspnea on exertion    Hypercholesterolemia    Hypertension    Migraine    Non-ulcer dyspepsia    Osteopenia of spine    Peptic ulcer disease    Pneumonia    Polyp of rectum    cancerous   Pre-diabetes    Skin cancer    Ulcerative colitis (HCC)        PAST SURGICAL HISTORY: Past Surgical History:  Procedure Laterality Date   ANKLE FRACTURE SURGERY Left    CARDIAC CATHETERIZATION     COLONOSCOPY     ESOPHAGOGASTRODUODENOSCOPY     hip replacement Bilateral    LEFT HEART CATH AND CORONARY ANGIOGRAPHY N/A 01/21/2022   Procedure: LEFT HEART CATH AND CORONARY ANGIOGRAPHY;  Surgeon: Odie Benne, MD;  Location: [facility];  Service: Cardiovascular;  Laterality: N/A;   RECTAL BIOPSY N/A 05/04/2022   Procedure: BIOPSY RECTAL VS EXCISION OF PERIANAL LESION UNDER ANOSCOPY;  Surgeon: Melvenia Stabs, MD;  Location: [facility];  Service: General;  Laterality: N/A;   TOTAL KNEE ARTHROPLASTY Left 10/19/2022   Procedure: LEFT TOTAL KNEE ARTHROPLASTY;  Surgeon: Wes Hamman, MD;  Location: [facility];  Service: Orthopedics;  Laterality: Left;   TUBAL LIGATION     TUMOR EXCISION N/A 01/14/2024   Procedure: TRANSRECTAL TUMOR EXCISION;  Surgeon: Melvenia Stabs, MD;  Location: [facility];  Service: General;  Laterality: N/A;  Transanal excision of distal rectal and anal lesion (wide local)     FAMILY HISTORY:  Family History  Problem Relation Age of Onset   CAD  Mother 62   Heart attack Mother    Heart attack Father 35   CAD Brother 30   Skin cancer Brother    Other Son        gluten sensitivity   Colon cancer Neg Hx    Esophageal cancer Neg Hx    Rectal cancer Neg Hx    Stomach cancer Neg Hx      SOCIAL HISTORY:  reports that she has never smoked. She has never used smokeless tobacco. She reports that she does not drink alcohol and does not use drugs.  The patient is divorced and lives in [place].  She is retired but helps take care of a disabled grandchild.   ALLERGIES: Codeine   MEDICATIONS:  Current Outpatient Medications  Medication Sig Dispense Refill   acetaminophen  (TYLENOL ) 500 MG tablet Take 1,000 mg by mouth every 6 (six) hours as needed for moderate pain.     albuterol  (VENTOLIN  HFA) 108 (90 Base) MCG/ACT inhaler Inhale 1-2 puffs into the lungs every 4 (four) hours as needed for wheezing or shortness of breath. 8 g 0   ammonium lactate (LAC-HYDRIN) 12 % lotion Apply 1 Application topically daily.     atorvastatin  (LIPITOR) 80 MG tablet TAKE ONE TABLET BY MOUTH DAILY (Patient taking differently: Take 80 mg by mouth daily. Take 1/2 tablet daily) 90 tablet 1   ezetimibe  (ZETIA ) 10 MG tablet TAKE 1 TABLET BY MOUTH DAILY 90 tablet 3   fluticasone  (FLONASE ) 50 MCG/ACT nasal spray Place 2 sprays into both nostrils daily. (Patient taking differently: Place 2 sprays into both nostrils daily as needed for allergies.) 1 g 0   hydrocortisone  (ANUSOL -HC) 2.5 % rectal cream Place 1 Application rectally 2 (two) times daily. 30 g 1   losartan  (COZAAR ) 25 MG tablet Take 25 mg by mouth daily.     nitroGLYCERIN  (NITROSTAT ) 0.4 MG SL tablet Place 1 tablet (0.4 mg total) under the tongue every 5 (five) minutes as needed for chest pain. (Patient not taking: Reported on 01/20/2024) 25 tablet 2   PARoxetine (PAXIL) 20 MG tablet Take 10 mg by mouth daily.     SUMAtriptan (IMITREX) 25 MG tablet Take 25 mg by mouth every 2 (two) hours as needed.      triamterene-hydrochlorothiazide (MAXZIDE-25) 37.5-25 MG tablet Take 1 tablet by mouth daily.     vedolizumab  (ENTYVIO ) 300 MG injection Inject 300 mg into the vein every 8 (eight) weeks.     No current facility-administered medications for this visit.     REVIEW OF SYSTEMS: On review of systems, the patient reports that ***      PHYSICAL EXAM:  Wt Readings from Last 3 Encounters:  01/20/24 169 lb 11.2 oz (77 kg)  01/14/24 169 lb 15.6 oz (77.1 kg)  01/11/24 173 lb (78.5 kg)   Temp Readings from Last 3 Encounters:  01/20/24 98.7 F (37.1 C) (Temporal)  01/14/24 Aaron Aas)  97.3 F (36.3 C)  01/11/24 97.9 F (36.6 C) (Oral)   BP Readings from Last 3 Encounters:  01/20/24 128/60  01/14/24 (!) 142/63  01/11/24 (!) 152/84   Pulse Readings from Last 3 Encounters:  01/20/24 80  01/14/24 89  01/11/24 67    /10  In general this is a well appearing Caucasian female in no acute distress.  She's alert and oriented x4 and appropriate throughout the examination. Cardiopulmonary assessment is negative for acute distress and she exhibits normal effort.     ECOG = ***  0 - Asymptomatic (Fully active, able to carry on all predisease activities without restriction)  1 - Symptomatic but completely ambulatory (Restricted in physically strenuous activity but ambulatory and able to carry out work of a light or sedentary nature. For example, light housework, office work)  2 - Symptomatic, <50% in bed during the day (Ambulatory and capable of all self care but unable to carry out any work activities. Up and about more than 50% of waking hours)  3 - Symptomatic, >50% in bed, but not bedbound (Capable of only limited self-care, confined to bed or chair 50% or more of waking hours)  4 - Bedbound (Completely disabled. Cannot carry on any self-care. Totally confined to bed or chair)  5 - Death   Aurea Blossom MM, Creech RH, Tormey DC, et al. [id number]). "Toxicity and response criteria of the [facility] Group". Am. Hillard Lowes. Oncol. 5 (6): 649-55    LABORATORY DATA:  Lab Results  Component Value Date   WBC 6.0 01/20/2024   HGB 11.2 (L) 01/20/2024   HCT 33.0 (L) 01/20/2024   MCV 84.2 01/20/2024   PLT 250 01/20/2024   Lab Results  Component Value Date   NA 140 01/20/2024   K 4.2 01/20/2024   CL 108 01/20/2024   CO2 27 01/20/2024   Lab Results  Component Value Date   ALT 19 01/20/2024   AST 22 01/20/2024   ALKPHOS 61 01/20/2024   BILITOT 0.6 01/20/2024      RADIOGRAPHY: No results found.     IMPRESSION/PLAN: 1. Squamous cell carcinoma of the anus with a history of AIN III. Dr. Jeryl Moris discusses the pathology findings and reviews the nature of *** We discussed the risks, benefits, short, and long term effects of radiotherapy, as well as the curative intent, and the patient is interested in proceeding. Dr. Jeryl Moris discusses the delivery and logistics of radiotherapy and anticipates a course of 51/2-6 weeks of radiotherapy pending her PET scan. She will simulate today. 2. Ulcerative colitis. Dr. Dominic Friendly is aware of her diagnosis and has recommended that she continue her current medication and can also use Imodium or Lomotil if she has difficulty with diarrhea during radiation. 3. Risks of pelvic floor dysfunction from radiotherapy. We discussed the importance of evaluation with physical therapy prior to pelvic radiation.  She has already been referred to pelvic floor clinic and medical grade dilators were provided for her to use following completion of radiation    In a visit lasting *** minutes, greater than 50% of the time was spent face to face discussing the patient's condition, in preparation for the discussion, and coordinating the patient's care.   The above documentation reflects my direct findings during this shared patient visit. Please see the separate note by Dr. Jeryl Moris on this date for the remainder of the patient's plan of care.    Shelvia Dick,  [facility]   **Disclaimer: This note was dictated with  voice recognition software. Similar sounding words can inadvertently be transcribed and this note may contain transcription errors which may not have been corrected upon publication of note.**

## 2024-01-25 ENCOUNTER — Encounter (HOSPITAL_COMMUNITY)
Admission: RE | Admit: 2024-01-25 | Discharge: 2024-01-25 | Disposition: A | Discharge status: Home / Self Care | Source: Ambulatory Visit | Attending: Nurse Practitioner | Admitting: Nurse Practitioner

## 2024-01-25 ENCOUNTER — Telehealth: Payer: Self-pay | Admitting: Radiation Oncology

## 2024-01-25 ENCOUNTER — Ambulatory Visit
Admission: RE | Admit: 2024-01-25 | Discharge: 2024-01-25 | Disposition: A | Discharge status: Home / Self Care | Source: Ambulatory Visit | Attending: Radiation Oncology | Admitting: Radiation Oncology

## 2024-01-25 ENCOUNTER — Encounter: Payer: Self-pay | Admitting: Radiation Oncology

## 2024-01-25 ENCOUNTER — Ambulatory Visit
Admission: RE | Admit: 2024-01-25 | Discharge: 2024-01-25 | Discharge status: Home / Self Care | Source: Ambulatory Visit | Attending: Radiation Oncology | Admitting: Radiation Oncology

## 2024-01-25 VITALS — BP 154/75 | HR 81 | Temp 97.3°F | Resp 18 | Ht 65.0 in | Wt 173.1 lb

## 2024-01-25 DIAGNOSIS — C210 Malignant neoplasm of anus, unspecified: Secondary | ICD-10-CM | POA: Insufficient documentation

## 2024-01-25 DIAGNOSIS — Z510 Encounter for antineoplastic radiation therapy: Secondary | ICD-10-CM | POA: Insufficient documentation

## 2024-01-25 DIAGNOSIS — E7800 Pure hypercholesterolemia, unspecified: Secondary | ICD-10-CM | POA: Diagnosis not present

## 2024-01-25 DIAGNOSIS — Z85828 Personal history of other malignant neoplasm of skin: Secondary | ICD-10-CM | POA: Insufficient documentation

## 2024-01-25 DIAGNOSIS — M8580 Other specified disorders of bone density and structure, unspecified site: Secondary | ICD-10-CM | POA: Diagnosis not present

## 2024-01-25 DIAGNOSIS — K5190 Ulcerative colitis, unspecified, without complications: Secondary | ICD-10-CM | POA: Diagnosis not present

## 2024-01-25 DIAGNOSIS — I10 Essential (primary) hypertension: Secondary | ICD-10-CM | POA: Insufficient documentation

## 2024-01-25 DIAGNOSIS — Z79899 Other long term (current) drug therapy: Secondary | ICD-10-CM | POA: Insufficient documentation

## 2024-01-25 DIAGNOSIS — D013 Carcinoma in situ of anus and anal canal: Secondary | ICD-10-CM

## 2024-01-25 LAB — GLUCOSE, CAPILLARY: Glucose-Capillary: 98 mg/dL (ref 70–99)

## 2024-01-25 MED ORDER — FLUDEOXYGLUCOSE F - 18 (FDG) INJECTION
8.6100 | Freq: Once | INTRAVENOUS | Status: AC
  Administered 2024-01-25: 8.61 via INTRAVENOUS

## 2024-01-25 NOTE — Telephone Encounter (Signed)
 I called and spoke with the patient to communicate the results of her PET scan and the node positive findings indicate 30 fractions of radiation and we will cover the iliac, inguinal and presacral chains with definitive dose radiotherapy along with her chemotherapy she has planned with Dr. Maryalice Smaller.

## 2024-01-25 NOTE — Progress Notes (Signed)
 GI Location of Tumor / Histology: Recurrent Anal Cancer  Andrea [name] presented to her surgeon in April 2025 with complaints of several months of anal itching and irritation.   PET 01/25/2024:  Anoscopy: Extension of tumor within the anal canal up to potentially just above the dentate line to the distal rectal area.  Biopsies of Anal Mass 01/14/2024   Past/Anticipated interventions by surgeon, if any:  Dr. Camilo Cella -Transrectal tumor excision 01/14/2024 -transanal excision of posterior midline anal canal lesion 05/04/2022   Past/Anticipated interventions by medical oncology, if any:  Rande Bushy / Dr. Maryalice Smaller 01/20/2024 -Will get PET scan for staging workup. -Set up consultation with Dr. Jeryl Moris, radiation oncologist. -Refer for PT, pelvic floor, for after treatment with concurrent chemoradiation. -Chemo start 01/31/2024.   Weight changes, if any: No  Bowel/Bladder complaints, if any: Denies bowel or bladder changes.  Nausea / Vomiting, if any: No  Pain issues, if any: She reports continued skin irritation to the anus   Any blood per rectum: She reports a little blood smears when she wipes after going to the bathroom.  Pap Smear: Has had abnormal pap smears in the past, most recent couple have been normal.  SAFETY ISSUES: Prior radiation? No Pacemaker/ICD? No Possible current pregnancy? Postmenopausal Is the patient on methotrexate? No  Current Complaints/Details:

## 2024-01-26 ENCOUNTER — Other Ambulatory Visit: Payer: Self-pay

## 2024-01-26 NOTE — Therapy (Signed)
 OUTPATIENT PHYSICAL THERAPY FEMALE PELVIC EVALUATION   Patient Name: Andrea [name] MRN: [id number] DOB:[birth year], 68 y.o., female Today's Date: 01/27/2024  END OF SESSION:  PT End of Session - 01/27/24 1300     Visit Number 1    Date for PT Re-Evaluation 04/20/24    Authorization Type Medicare    PT Start Time 0945    PT Stop Time 1015    PT Time Calculation (min) 30 min    Activity Tolerance Patient tolerated treatment well    Behavior During Therapy WFL for tasks assessed/performed             Past Medical History:  Diagnosis Date   Anemia    Anginal pain (HCC)    Arthritis    Chest discomfort    Disorder of immune system (HCC)    Dyspnea    Dyspnea on exertion    Hypercholesterolemia    Hypertension    Migraine    Non-ulcer dyspepsia    Osteopenia of spine    Peptic ulcer disease    Pneumonia    Polyp of rectum    cancerous   Pre-diabetes    Skin cancer    Ulcerative colitis (HCC)    Past Surgical History:  Procedure Laterality Date   ANKLE FRACTURE SURGERY Left    CARDIAC CATHETERIZATION     COLONOSCOPY     ESOPHAGOGASTRODUODENOSCOPY     hip replacement Bilateral    LEFT HEART CATH AND CORONARY ANGIOGRAPHY N/A 01/21/2022   Procedure: LEFT HEART CATH AND CORONARY ANGIOGRAPHY;  Surgeon: Odie Benne, MD;  Location: [facility];  Service: Cardiovascular;  Laterality: N/A;   RECTAL BIOPSY N/A 05/04/2022   Procedure: BIOPSY RECTAL VS EXCISION OF PERIANAL LESION UNDER ANOSCOPY;  Surgeon: Melvenia Stabs, MD;  Location: [facility];  Service: General;  Laterality: N/A;   TOTAL KNEE ARTHROPLASTY Left 10/19/2022   Procedure: LEFT TOTAL KNEE ARTHROPLASTY;  Surgeon: Wes Hamman, MD;  Location: [facility];  Service: Orthopedics;  Laterality: Left;   TUBAL LIGATION     TUMOR EXCISION N/A 01/14/2024   Procedure: TRANSRECTAL TUMOR EXCISION;  Surgeon: Melvenia Stabs, MD;  Location: [facility];  Service: General;  Laterality: N/A;   Transanal excision of distal rectal and anal lesion (wide local)   Patient Active Problem List   Diagnosis Date Noted   Anal cancer (HCC) 01/20/2024   AIN III (anal intraepithelial neoplasia III) 11/09/2023   Anal irritation 11/09/2023   History of anal cancer 11/09/2023   Status post total left knee replacement 10/19/2022   Primary osteoarthritis of left knee 09/30/2022   Chest pain of uncertain etiology    Ulcerative colitis (HCC) 07/06/2019   PUD (peptic ulcer disease) 07/06/2019    PCP: Alejandro Hurt, FNP  REFERRING PROVIDER:  Sonja Laurel Park, MD  REFERRING DIAG: C21.0 (ICD-10-CM) - Anal cancer [facility])  THERAPY DIAG:  Muscle weakness (generalized)  Abnormal posture  Rationale for Evaluation and Treatment: Rehabilitation  ONSET DATE: 12/2023  SUBJECTIVE:  SUBJECTIVE STATEMENT: 15 mins late to her appt.  Pt reports that she is overwhelmed. She reports that she has never heard of pelvic PT, does not know what to expect, has ha DTaP for her knees and hips.  Feels like her hips are tight around her scars d/t scar tissue Has not been active in the last 3 weeks, until then she was riding a bike in the gym Pt reports that she will have 30 treatments back to back- chemo and radiation   Fluid intake: to be asked  PAIN:  Are you having pain? No  PRECAUTIONS: None  RED FLAGS: None   WEIGHT BEARING RESTRICTIONS: No  FALLS:  Has patient fallen in last 6 months? No  OCCUPATION: retired  ACTIVITY LEVEL : not active in the last 3 weeks  PLOF: Independent  PATIENT GOALS: does not know  PERTINENT HISTORY:  ANKLE FRACTURE SURGERY Left     CARDIAC CATHETERIZATION      COLONOSCOPY      ESOPHAGOGASTRODUODENOSCOPY      hip replacement Bilateral     LEFT HEART CATH AND CORONARY ANGIOGRAPHY  N/A 01/21/2022 Procedure: LEFT HEART CATH AND CORONARY ANGIOGRAPHY;  Surgeon: Odie Benne, MD;  Location: [facility];  Service: Cardiovascular;  Laterality: N/A;   RECTAL BIOPSY N/A 05/04/2022 Procedure: BIOPSY RECTAL VS EXCISION OF PERIANAL LESION UNDER ANOSCOPY;  Surgeon: Melvenia Stabs, MD;  Location: [facility];  Service: General;  Laterality: N/A;   TOTAL KNEE ARTHROPLASTY Left 10/19/2022 Procedure: LEFT TOTAL KNEE ARTHROPLASTY;  Surgeon: Wes Hamman, MD;  Location: [facility];  Service: Orthopedics;  Laterality: Left;   TUBAL LIGATION      TUMOR EXCISION        Sexual abuse: No  BOWEL MOVEMENT:no issues   URINATION: no issues  INTERCOURSE: not active   PREGNANCY: Vaginal deliveries 3 Tearing Yes: big babies PROLAPSE: None   OBJECTIVE:  Note: Objective measures were completed at Evaluation unless otherwise noted.  DIAGNOSTIC FINDINGS:  Anal cancer  PATIENT SURVEYS:    PFIQ-7: 0  COGNITION: Overall cognitive status: Within functional limits for tasks assessed     SENSATION: Light touch: Appears intact  LUMBAR SPECIAL TESTS:  Straight leg raise test: Negative  FUNCTIONAL TESTS:  5 times sit to stand: 13 seconds  GAIT: Assistive device utilized: None Comments: antalgic  POSTURE: rounded shoulders and forward head   LUMBARAROM/PROM: full   LOWER EXTREMITY ROM: full  LOWER EXTREMITY MMT: right hip 4-/5 flexion, left hip 4/5  PALPATION:   General: upper chest breathing, no tightness bilateral hip scars, low abdominal tone  Pelvic Alignment: even  Abdominal: abdominal doming supra umbilical, low abdominal tone                 External Perineal Exam: deferred to next visit                             Internal Pelvic Floor: deferred to next visit  Patient confirms identification and approves PT to assess internal pelvic floor and treatment No  PELVIC MMT:   MMT eval  Vaginal   Internal Anal Sphincter   External Anal Sphincter    Puborectalis   Diastasis Recti 1 finger at and under umbilicus, doming above umbilicus  (Blank rows = not tested)        TONE: Deferred to next appt  PROLAPSE: Deferred to next appt  TODAY'S TREATMENT:  DATE: 01/27/2024  EVAL see below   PATIENT EDUCATION:  Education details: relevant anatomy, exam findings, expectations of PT, chemo, radiation Person educated: Patient Education method: Explanation, Demonstration, Tactile cues, Verbal cues, and Handouts Education comprehension: verbalized understanding, returned demonstration, verbal cues required, tactile cues required, and needs further education  HOME EXERCISE PROGRAM: Access Code: [email or web address] URL: [email or web address] Date: 01/27/2024 Prepared by: Jameson Mcburney Yavuz Kirby  Exercises - Partial Lunge with Chair  - 1 x daily - 7 x weekly - 3 sets - 10 reps - Horizontal abd with TB with TRA breath  - 1 x daily - 7 x weekly - 3 sets - 10 reps  ASSESSMENT:  CLINICAL IMPRESSION: Patient is a 68 y.o. F who was seen today for physical therapy evaluation and treatment for anal cancer. Decreased balance and strength bilateral hips and knees. Pt seems stressed and overwhelmed with recent anal cancer diagnosis and not sure what to expect from PT. She demonstrates weakness throughout her hips and knees, has not been exercises, had diastasis rectus abdominis with doming, poor core coordination and strength. Educated her on benefits of PT post treatment, potential side effects,  benefits of aerobic exercise to maintain strength and energy during treatment. Pt not sexually active, she has a hx of bilateral hip and left knee replacement, unable to kneel on left knee. Green and blue theraband provided for HEP. Pt will benefit from PT.   OBJECTIVE IMPAIRMENTS: Abnormal gait, decreased activity tolerance, decreased balance,  decreased coordination, decreased endurance, difficulty walking, decreased strength, and impaired tone.   ACTIVITY LIMITATIONS: squatting and caring for others  PARTICIPATION LIMITATIONS: community activity and yard work  PERSONAL FACTORS: 1 comorbidity: anal cancer are also affecting patient's functional outcome.   REHAB POTENTIAL: Good  CLINICAL DECISION MAKING: Stable/uncomplicated  EVALUATION COMPLEXITY: Low   GOALS: Goals reviewed with patient? Yes  SHORT TERM GOALS: Target date: 4 weeks after her treatment ends    Pt will be independent with HEP.   Baseline: Goal status: INITIAL   LONG TERM GOALS: Target date: 04/20/2024    Pt will be independent with advanced HEP.   Baseline:  Goal status: INITIAL  2.  Pt will be able to participate in at least 45 mins exercise session without increased fatigue Baseline:  Goal status: INITIAL  3.  Pt will dem at least 12 sec for 5xSTS to demonstrate good hip strength to help with ease of transfers in and out of the car , bed and chair Baseline:  Goal status: INITIAL  4.  Pt will dem good balance to reduce risk of fall Baseline:  Goal status: INITIAL  5.  Pt will have 0 pelvic pain, be able to contract/ relax and bulge her pelvic floor with good strength and coordination Baseline:  Goal status: INITIAL   PLAN:  PT FREQUENCY: 1-2x/week  PT DURATION: 12 weeks  PLANNED INTERVENTIONS: 97110-Therapeutic exercises, 97530- Therapeutic activity, 97112- Neuromuscular re-education, 97535- Self Care, 16109- Manual therapy, 97032- Electrical stimulation (manual), Taping, Dry Needling, Joint mobilization, Joint manipulation, Spinal manipulation, Spinal mobilization, Manual lymph drainage, Compression bandaging, Cryotherapy, Moist heat, and Biofeedback  PLAN FOR NEXT SESSION: re- eval, internal pelvic floor muscle assessment, develop HEP, strengthening post tx   Nathian Stencil, PT 01/27/2024, 1:02 PM

## 2024-01-27 ENCOUNTER — Ambulatory Visit: Attending: Hematology | Admitting: Physical Therapy

## 2024-01-27 ENCOUNTER — Other Ambulatory Visit

## 2024-01-27 ENCOUNTER — Other Ambulatory Visit: Payer: Self-pay

## 2024-01-27 ENCOUNTER — Encounter: Payer: Self-pay | Admitting: Physical Therapy

## 2024-01-27 ENCOUNTER — Inpatient Hospital Stay

## 2024-01-27 DIAGNOSIS — C210 Malignant neoplasm of anus, unspecified: Secondary | ICD-10-CM

## 2024-01-27 DIAGNOSIS — R293 Abnormal posture: Secondary | ICD-10-CM | POA: Diagnosis present

## 2024-01-27 DIAGNOSIS — M6281 Muscle weakness (generalized): Secondary | ICD-10-CM | POA: Diagnosis present

## 2024-01-28 ENCOUNTER — Ambulatory Visit (HOSPITAL_COMMUNITY)
Admission: RE | Admit: 2024-01-28 | Discharge: 2024-01-28 | Disposition: A | Discharge status: Home / Self Care | Source: Ambulatory Visit | Attending: Hematology | Admitting: Hematology

## 2024-01-28 ENCOUNTER — Other Ambulatory Visit: Payer: Self-pay

## 2024-01-28 ENCOUNTER — Encounter: Payer: Self-pay | Admitting: Hematology

## 2024-01-28 ENCOUNTER — Encounter: Payer: Self-pay | Admitting: Gastroenterology

## 2024-01-28 DIAGNOSIS — C210 Malignant neoplasm of anus, unspecified: Secondary | ICD-10-CM

## 2024-01-28 NOTE — Progress Notes (Signed)
 The proposed treatment discussed in conference is for discussion purpose only and is not a binding recommendation.  The patients have not been physically examined, or presented with their treatment options.  Therefore, final treatment plans cannot be decided.

## 2024-01-30 DIAGNOSIS — Z510 Encounter for antineoplastic radiation therapy: Secondary | ICD-10-CM | POA: Diagnosis not present

## 2024-01-31 ENCOUNTER — Ambulatory Visit
Admission: RE | Admit: 2024-01-31 | Discharge: 2024-01-31 | Disposition: A | Discharge status: Home / Self Care | Source: Ambulatory Visit | Attending: Radiation Oncology | Admitting: Radiation Oncology

## 2024-01-31 ENCOUNTER — Other Ambulatory Visit: Payer: Self-pay

## 2024-01-31 ENCOUNTER — Inpatient Hospital Stay

## 2024-01-31 ENCOUNTER — Inpatient Hospital Stay (HOSPITAL_BASED_OUTPATIENT_CLINIC_OR_DEPARTMENT_OTHER): Admitting: Hematology

## 2024-01-31 ENCOUNTER — Ambulatory Visit (HOSPITAL_COMMUNITY)
Admission: RE | Admit: 2024-01-31 | Discharge: 2024-01-31 | Disposition: A | Discharge status: Home / Self Care | Source: Ambulatory Visit | Attending: Hematology | Admitting: Hematology

## 2024-01-31 VITALS — BP 140/60 | HR 83 | Temp 98.0°F | Resp 21 | Ht 65.0 in | Wt 174.3 lb

## 2024-01-31 DIAGNOSIS — C210 Malignant neoplasm of anus, unspecified: Secondary | ICD-10-CM

## 2024-01-31 DIAGNOSIS — R079 Chest pain, unspecified: Secondary | ICD-10-CM | POA: Diagnosis not present

## 2024-01-31 DIAGNOSIS — I309 Acute pericarditis, unspecified: Secondary | ICD-10-CM | POA: Diagnosis not present

## 2024-01-31 DIAGNOSIS — Z95828 Presence of other vascular implants and grafts: Secondary | ICD-10-CM | POA: Insufficient documentation

## 2024-01-31 LAB — CMP (CANCER CENTER ONLY)
ALT: 15 U/L (ref 0–44)
AST: 21 U/L (ref 15–41)
Albumin: 4.1 g/dL (ref 3.5–5.0)
Alkaline Phosphatase: 87 U/L (ref 38–126)
Anion gap: 5 (ref 5–15)
BUN: 23 mg/dL (ref 8–23)
CO2: 26 mmol/L (ref 22–32)
Calcium: 9.5 mg/dL (ref 8.9–10.3)
Chloride: 106 mmol/L (ref 98–111)
Creatinine: 1.05 mg/dL — ABNORMAL HIGH (ref 0.44–1.00)
GFR, Estimated: 58 mL/min — ABNORMAL LOW (ref >60)
Glucose, Bld: 99 mg/dL (ref 70–99)
Potassium: 4.3 mmol/L (ref 3.5–5.1)
Sodium: 137 mmol/L (ref 135–145)
Total Bilirubin: 0.4 mg/dL (ref 0.0–1.2)
Total Protein: 7.1 g/dL (ref 6.5–8.1)

## 2024-01-31 LAB — CBC WITH DIFFERENTIAL (CANCER CENTER ONLY)
Abs Immature Granulocytes: 0.02 10*3/uL (ref 0.00–0.07)
Basophils Absolute: 0 10*3/uL (ref 0.0–0.1)
Basophils Relative: 1 %
Eosinophils Absolute: 0.1 10*3/uL (ref 0.0–0.5)
Eosinophils Relative: 2 %
HCT: 31.6 % — ABNORMAL LOW (ref 36.0–46.0)
Hemoglobin: 10.9 g/dL — ABNORMAL LOW (ref 12.0–15.0)
Immature Granulocytes: 0 %
Lymphocytes Relative: 26 %
Lymphs Abs: 1.8 10*3/uL (ref 0.7–4.0)
MCH: 29 pg (ref 26.0–34.0)
MCHC: 34.5 g/dL (ref 30.0–36.0)
MCV: 84 fL (ref 80.0–100.0)
Monocytes Absolute: 0.7 10*3/uL (ref 0.1–1.0)
Monocytes Relative: 10 %
Neutro Abs: 4.2 10*3/uL (ref 1.7–7.7)
Neutrophils Relative %: 61 %
Platelet Count: 230 10*3/uL (ref 150–400)
RBC: 3.76 MIL/uL — ABNORMAL LOW (ref 3.87–5.11)
RDW: 13.2 % (ref 11.5–15.5)
WBC Count: 6.9 10*3/uL (ref 4.0–10.5)
nRBC: 0 % (ref 0.0–0.2)

## 2024-01-31 LAB — RAD ONC ARIA SESSION SUMMARY
Course Elapsed Days: 0
Plan Fractions Treated to Date: 1
Plan Prescribed Dose Per Fraction: 1.8 Gy
Plan Total Fractions Prescribed: 30
Plan Total Prescribed Dose: 54 Gy
Reference Point Dosage Given to Date: 1.8 Gy
Reference Point Session Dosage Given: 1.8 Gy
Session Number: 1

## 2024-01-31 MED ORDER — LIDOCAINE HCL 1 % IJ SOLN
INTRAMUSCULAR | Status: AC
  Filled 2024-01-31: qty 20

## 2024-01-31 MED ORDER — SODIUM CHLORIDE 0.9% FLUSH
10.0000 mL | Freq: Once | INTRAVENOUS | Status: AC
Start: 2024-01-31 — End: 2024-01-31
  Administered 2024-01-31: 10 mL

## 2024-01-31 MED ORDER — SODIUM CHLORIDE 0.9 % IV SOLN
INTRAVENOUS | Status: DC
Start: 2024-01-31 — End: 2024-01-31

## 2024-01-31 MED ORDER — HEPARIN SOD (PORK) LOCK FLUSH 100 UNIT/ML IV SOLN: 500.0000 [IU] | Freq: Once | INTRAVENOUS | Status: DC

## 2024-01-31 MED ORDER — HEPARIN SOD (PORK) LOCK FLUSH 100 UNIT/ML IV SOLN: 500.0000 [IU] | Freq: Once | INTRAVENOUS | Status: DC | PRN

## 2024-01-31 MED ORDER — LIDOCAINE HCL 1 % IJ SOLN
20.0000 mL | Freq: Once | INTRAMUSCULAR | Status: AC
  Administered 2024-01-31: 4 mL via INTRADERMAL

## 2024-01-31 MED ORDER — PROCHLORPERAZINE MALEATE 10 MG PO TABS
10.0000 mg | ORAL_TABLET | Freq: Once | ORAL | Status: AC
  Administered 2024-01-31: 10 mg via ORAL
  Filled 2024-01-31: qty 1

## 2024-01-31 MED ORDER — SODIUM CHLORIDE 0.9% FLUSH: 10.0000 mL | INTRAVENOUS | Status: DC | PRN

## 2024-01-31 MED ORDER — MITOMYCIN CHEMO IV INJECTION 20 MG
10.0000 mg/m2 | Freq: Once | INTRAVENOUS | Status: AC
  Administered 2024-01-31: 19 mg via INTRAVENOUS
  Filled 2024-01-31: qty 38

## 2024-01-31 MED ORDER — SODIUM CHLORIDE 0.9 % IV SOLN
1000.0000 mg/m2/d | INTRAVENOUS | Status: DC
  Administered 2024-01-31: 7000 mg via INTRAVENOUS
  Filled 2024-01-31: qty 140

## 2024-01-31 NOTE — Patient Instructions (Addendum)
 [facility] - A DEPT OF [facility]  Discharge Instructions: Thank you for choosing [facility] to provide your oncology and hematology care.   If you have a lab appointment with the Cancer Center, please go directly to the Cancer Center and check in at the registration area.   Wear comfortable clothing and clothing appropriate for easy access to any Portacath or PICC line.   We strive to give you quality time with your provider. You may need to reschedule your appointment if you arrive late (15 or more minutes).  Arriving late affects you and other patients whose appointments are after yours.  Also, if you miss three or more appointments without notifying the office, you may be dismissed from the clinic at the provider's discretion.      For prescription refill requests, have your pharmacy contact our office and allow 72 hours for refills to be completed.    Today you received the following chemotherapy and/or immunotherapy agents Mitomycin, Florouracil      To help prevent nausea and vomiting after your treatment, we encourage you to take your nausea medication as directed.  BELOW ARE SYMPTOMS THAT SHOULD BE REPORTED IMMEDIATELY: *FEVER GREATER THAN 100.4 F (38 C) OR HIGHER *CHILLS OR SWEATING *NAUSEA AND VOMITING THAT IS NOT CONTROLLED WITH YOUR NAUSEA MEDICATION *UNUSUAL SHORTNESS OF BREATH *UNUSUAL BRUISING OR BLEEDING *URINARY PROBLEMS (pain or burning when urinating, or frequent urination) *BOWEL PROBLEMS (unusual diarrhea, constipation, pain near the anus) TENDERNESS IN MOUTH AND THROAT WITH OR WITHOUT PRESENCE OF ULCERS (sore throat, sores in mouth, or a toothache) UNUSUAL RASH, SWELLING OR PAIN  UNUSUAL VAGINAL DISCHARGE OR ITCHING   Items with * indicate a potential emergency and should be followed up as soon as possible or go to the Emergency Department if any problems should occur.  Please show the CHEMOTHERAPY ALERT CARD or  IMMUNOTHERAPY ALERT CARD at check-in to the Emergency Department and triage nurse.  Should you have questions after your visit or need to cancel or reschedule your appointment, please contact [facility] - A DEPT OF [facility]  Dept: [phone]  and follow the prompts.  Office hours are 8:00 a.m. to 4:30 p.m. Monday - Friday. Please note that voicemails left after 4:00 p.m. may not be returned until the following business day.  We are closed weekends and major holidays. You have access to a nurse at all times for urgent questions. Please call the main number to the clinic Dept: [phone] and follow the prompts.   For any non-urgent questions, you may also contact your provider using MyChart. We now offer e-Visits for anyone 33 and older to request care online for non-urgent symptoms. For details visit [email or web address].   Also download the MyChart app! Go to the app store, search "MyChart", open the app, select [place], and log in with your MyChart username and password.

## 2024-01-31 NOTE — Procedures (Signed)
 Right double-lumen basilic vein PICC placed.  Length 40 cm.  Tip SVC/right atrial junction.  Okay to use.  Medication used-1% lidocaine  to skin and subcutaneous tissue.  No immediate complications.  EBL less than 2 cc.

## 2024-01-31 NOTE — Assessment & Plan Note (Signed)
 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  -PET Jan 25, 2024 showed long segment anorectal hypermetabolic activity, and hypermetabolic right inguinal and external iliac lymph nodes metastasis.  No distant metastasis. -Plan to start concurrent chemoradiation with 5-FU and mitomycin on Jan 31, 2024.

## 2024-01-31 NOTE — Progress Notes (Signed)
 PATIENT NAVIGATOR PROGRESS NOTE  Name: Andrea [name] Date: 01/31/2024 MRN: [id number]  DOB: 04/24/1956   Reason for visit:  First Day of Chemo/Radiation  Comments:   Patient in office for first day of chemo/radiation.  Patient had PICC line placed this morning and stated the procedure went well without any issues.  Patient had questions about side effects of chemo/radiation treatment. Patient given education on when to take her home nausea medications.  Patient instructed to push fluids and to eat small meals.  Patient advised to call office if her nausea is not well controlled with her home nausea medications, or if she is unable to keep anything down.  Going forward, patient was instructed to contact Dr. Candise Chambers nurse with any questions or concerns now that she has started treatment.  Patient verbalized understanding to all and was in agreement.   Time spent counseling/coordinating care: 45-60 minutes

## 2024-01-31 NOTE — Progress Notes (Signed)
 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  -PET Jan 25, 2024 showed long segment anorectal hypermetabolic activity, and hypermetabolic right inguinal and external iliac lymph nodes metastasis.  No distant metastasis. -Plan to start concurrent chemoradiation with 5-FU and mitomycin on Jan 31, 2024.  Assessment & Plan Stage IIIA anal cancer Stage IIIA rectal cancer with primary tumor in the anus and involvement of right inguinal and external and internal iliac lymph nodes.  I personally reviewed her PET scan images with patient and her family today.  Locally advanced but not metastatic. Treatment goal is curative with chemotherapy and radiation. Discussed common side effects of chemotherapy including fatigue, anorexia, mucositis, and infection risk. Radiation may cause skin toxicity, especially in the groin and buttock areas. Explained that the cure rate for stage III rectal cancer is approximately 70%. - Proceed with chemotherapy and radiation treatment as scheduled. - Monitor for side effects of chemotherapy and radiation, including skin toxicity and mucositis. - Repeat PET scan three months after the last dose of radiation to evaluate treatment response. - Follow up with colorectal surgeon Dr. Camilo Cella one  month after completion of chemo-radiation.  Rectal sigmoiditis  -well-controlled controlled with Entyvio   -Colitis may exacerbate diarrhea during chemotherapy. Discussed potential for increased diarrhea due to colitis and chemotherapy-induced inflammation. Advised on the use of Imodium and the possibility of requiring Lomotil if diarrhea persists. - Continue Tobi every eight weeks. - Monitor for increased diarrhea and take Imodium as needed. - If diarrhea persists or worsens, contact the clinic for potential Lomotil prescription. - Monitor for signs of dehydration and consider IV fluids if necessary.  Hypertension Hypertension managed with multiple medications, including Maxzide, a diuretic. Discussed risk of dehydration during chemotherapy and radiation, which may necessitate holding diuretics. Advised monitoring blood pressure closely, especially during periods of diarrhea or decreased oral intake. - Monitor blood pressure regularly, especially during periods of diarrhea or decreased oral intake. - Hold Maxzide if systolic blood pressure drops below 100 mmHg or if experiencing significant fatigue or dehydration. - Contact the clinic if there are concerns about blood pressure management.  Anemia Mild anemia consistent with baseline levels. No immediate intervention required.   Plan - Lab reviewed, adequate for treatment, will proceed to first cycle chemotherapy 5-FU and Idamycin today, chemo completed on Friday with PICC line removal - Lab and follow-up weekly     SUMMARY OF ONCOLOGIC HISTORY: Oncology History  Anal cancer (HCC)  12/29/2023 Pathology Results   A. PERIANAL, RIGHT POSTERIOR, BIOPSY:       Invasive squamous cell carcinoma, moderately differentiated. Suspicious for lymphovascular invasion.  B. PERIANAL, LEFT POSTERIOR, BIOPSY:       High-grade squamous intraepithelial lesion (HSIL / AIN 3).  No evidence of definitive invasion.    01/20/2024 Initial Diagnosis   Anal  cancer (HCC)  01/26/2024 Cancer Staging   Staging form: Anus, AJCC 8th Edition - Clinical: Stage IIIA (cT2, cN1c, cM0) - Signed by Sonja Genesee, MD on 01/26/2024   01/31/2024 -  Chemotherapy   Patient is on Treatment Plan : ANUS Mitomycin D1,28 + 5FU D1-4, 28-31 q32d        Discussed the use of AI scribe software for clinical note transcription with the patient, who gave verbal consent to proceed.  History of Present Illness Andrea [name] is a 68 year old female with stage III anal cancer who presents for initiation of chemotherapy and radiation treatment. She was referred by Dr. Camilo Cella for follow-up after biopsy and surgery.  She has stage III anal cancer localized to the rectum with involvement of the right inguinal and iliac lymph nodes. A PET scan shows the primary tumor in the rectum and lymph node involvement in the pelvic and groin area. She experiences minimal rectal bleeding without pain or constipation.  She has colitis and is concerned about potential increased diarrhea due to chemotherapy. She takes Tobi every eight weeks for colitis.  She is on several blood pressure medications, including Maxzide. A history of hip replacement was noted during the scan.     All other systems were reviewed with the patient and are negative.  MEDICAL HISTORY:  Past Medical History:  Diagnosis Date   Anemia    Anginal pain (HCC)    Arthritis    Chest discomfort    Disorder of immune system (HCC)    Dyspnea    Dyspnea on exertion    Hypercholesterolemia    Hypertension    Migraine    Non-ulcer dyspepsia    Osteopenia of spine    Peptic ulcer disease    Pneumonia    Polyp of rectum    cancerous   Pre-diabetes    Skin cancer    Ulcerative colitis (HCC)     SURGICAL HISTORY: Past Surgical History:  Procedure Laterality Date   ANKLE FRACTURE SURGERY Left    CARDIAC CATHETERIZATION     COLONOSCOPY     ESOPHAGOGASTRODUODENOSCOPY     hip replacement Bilateral    LEFT HEART CATH  AND CORONARY ANGIOGRAPHY N/A 01/21/2022   Procedure: LEFT HEART CATH AND CORONARY ANGIOGRAPHY;  Surgeon: Odie Benne, MD;  Location: [facility];  Service: Cardiovascular;  Laterality: N/A;   RECTAL BIOPSY N/A 05/04/2022   Procedure: BIOPSY RECTAL VS EXCISION OF PERIANAL LESION UNDER ANOSCOPY;  Surgeon: Melvenia Stabs, MD;  Location: [facility];  Service: General;  Laterality: N/A;   TOTAL KNEE ARTHROPLASTY Left 10/19/2022   Procedure: LEFT TOTAL KNEE ARTHROPLASTY;  Surgeon: Wes Hamman, MD;  Location: [facility];  Service: Orthopedics;  Laterality: Left;   TUBAL LIGATION     TUMOR EXCISION N/A 01/14/2024   Procedure: TRANSRECTAL TUMOR EXCISION;  Surgeon: Melvenia Stabs, MD;  Location: [facility];  Service: General;  Laterality: N/A;  Transanal excision of distal rectal and anal lesion (wide local)    I have reviewed the social history and family history with the patient and they are unchanged from previous note.  ALLERGIES:  is allergic to codeine.  MEDICATIONS:  Current Outpatient Medications  Medication Sig Dispense Refill   acetaminophen  (TYLENOL ) 500 MG tablet Take 1,000 mg by mouth every 6 (six) hours as needed for moderate pain.     albuterol  (VENTOLIN  HFA) 108 (90 Base) MCG/ACT inhaler Inhale 1-2 puffs into the lungs every 4 (four) hours as  needed for wheezing or shortness of breath. 8 g 0   ammonium lactate (LAC-HYDRIN) 12 % lotion Apply 1 Application topically daily.     atorvastatin  (LIPITOR) 80 MG tablet TAKE ONE TABLET BY MOUTH DAILY (Patient taking differently: Take 80 mg by mouth daily. Take 1/2 tablet daily) 90 tablet 1   ezetimibe  (ZETIA ) 10 MG tablet TAKE 1 TABLET BY MOUTH DAILY 90 tablet 3   fluticasone  (FLONASE ) 50 MCG/ACT nasal spray Place 2 sprays into both nostrils daily. (Patient taking differently: Place 2 sprays into both nostrils daily as needed for allergies.) 1 g 0   losartan  (COZAAR ) 25 MG tablet Take 25 mg by mouth daily.      ondansetron  (ZOFRAN ) 8 MG tablet Take 1 tablet (8 mg total) by mouth every 8 (eight) hours as needed for nausea or vomiting. 30 tablet 1   PARoxetine (PAXIL) 20 MG tablet Take 10 mg by mouth daily.     prochlorperazine  (COMPAZINE ) 10 MG tablet Take 1 tablet (10 mg total) by mouth every 6 (six) hours as needed for nausea or vomiting. 30 tablet 1   SUMAtriptan (IMITREX) 25 MG tablet Take 25 mg by mouth every 2 (two) hours as needed.     triamterene-hydrochlorothiazide (MAXZIDE-25) 37.5-25 MG tablet Take 1 tablet by mouth daily.     vedolizumab  (ENTYVIO ) 300 MG injection Inject 300 mg into the vein every 8 (eight) weeks.     hydrocortisone  (ANUSOL -HC) 2.5 % rectal cream Place 1 Application rectally 2 (two) times daily. 30 g 1   nitroGLYCERIN  (NITROSTAT ) 0.4 MG SL tablet Place 1 tablet (0.4 mg total) under the tongue every 5 (five) minutes as needed for chest pain. (Patient not taking: Reported on 01/25/2024) 25 tablet 2   No current facility-administered medications for this visit.   Facility-Administered Medications Ordered in Other Visits  Medication Dose Route Frequency Provider Last Rate Last Admin   heparin  lock flush 100 unit/mL  500 Units Intravenous Once Allred, Darrell K, PA-C        PHYSICAL EXAMINATION: ECOG PERFORMANCE STATUS: 0 - Asymptomatic  Vitals:   01/31/24 1429  BP: (!) 140/60  Pulse: 83  Resp: (!) 21  Temp: 98 F (36.7 C)  SpO2: 99%   Wt Readings from Last 3 Encounters:  01/31/24 174 lb 4.8 oz (79.1 kg)  01/25/24 173 lb 2 oz (78.5 kg)  01/20/24 169 lb 11.2 oz (77 kg)     GENERAL:alert, no distress and comfortable SKIN: skin color, texture, turgor are normal, no rashes or significant lesions EYES: normal, Conjunctiva are pink and non-injected, sclera clear NECK: supple, thyroid normal size, non-tender, without nodularity LYMPH:  no palpable lymphadenopathy in the cervical, axillary  LUNGS: clear to auscultation and percussion with normal breathing  effort HEART: regular rate & rhythm and no murmurs and no lower extremity edema ABDOMEN:abdomen soft, non-tender and normal bowel sounds Musculoskeletal:no cyanosis of digits and no clubbing  NEURO: alert & oriented x 3 with fluent speech, no focal motor/sensory deficits  Physical Exam    LABORATORY DATA:  I have reviewed the data as listed    Latest Ref Rng & Units 01/31/2024    1:52 PM 01/20/2024    2:50 PM 10/20/2022    5:03 AM  CBC  WBC 4.0 - 10.5 K/uL 6.9  6.0  7.9   Hemoglobin 12.0 - 15.0 g/dL 16.1  09.6  04.5   Hematocrit 36.0 - 46.0 % 31.6  33.0  29.7   Platelets 150 - 400 K/uL 230  250  148         Latest Ref Rng & Units 01/31/2024    1:52 PM 01/20/2024    2:50 PM 01/11/2024    7:54 AM  CMP  Glucose 70 - 99 mg/dL 99  93  94   BUN 8 - 23 mg/dL 23  23  18    Creatinine 0.44 - 1.00 mg/dL 4.09  8.11  9.14   Sodium 135 - 145 mmol/L 137  140  139   Potassium 3.5 - 5.1 mmol/L 4.3  4.2  4.2   Chloride 98 - 111 mmol/L 106  108  108   CO2 22 - 32 mmol/L 26  27  22    Calcium  8.9 - 10.3 mg/dL 9.5  78.2  9.5   Total Protein 6.5 - 8.1 g/dL 7.1  7.4    Total Bilirubin 0.0 - 1.2 mg/dL 0.4  0.6    Alkaline Phos 38 - 126 U/L 87  61    AST 15 - 41 U/L 21  22    ALT 0 - 44 U/L 15  19        RADIOGRAPHIC STUDIES: I have personally reviewed the radiological images as listed and agreed with the findings in the report. IR PICC PLACEMENT RIGHT <5 YRS INC IMG GUIDE Result Date: 01/31/2024 INDICATION: Patient with history of anal cancer; central venous access requested for chemotherapy EXAM: RIGHT UPPER EXTREMITY PICC LINE PLACEMENT WITH ULTRASOUND AND FLUOROSCOPIC GUIDANCE MEDICATIONS: 4 mL 1% lidocaine  to skin and subcutaneous tissue ANESTHESIA/SEDATION: None FLUOROSCOPY: Radiation Exposure Index (as provided by the fluoroscopic device): 1 mGy Kerma COMPLICATIONS: None immediate. PROCEDURE: The patient was advised of the possible risks and complications and agreed to undergo the procedure.  The patient was then brought to the angiographic suite for the procedure. The right arm was prepped with chlorhexidine , draped in the usual sterile fashion using maximum barrier technique (cap and mask, sterile gown, sterile gloves, large sterile sheet, hand hygiene and cutaneous antisepsis) and infiltrated locally with 1% Lidocaine . Ultrasound demonstrated patency of the right basilic vein, and this was documented with an image. Under real-time ultrasound guidance, this vein was accessed with a 21 gauge micropuncture needle and image documentation was performed. A 0.018 wire was introduced in to the vein. Over this, a 5 [place] dual lumen power injectable PICC was advanced to the lower SVC/right atrial junction. Fluoroscopy during the procedure and fluoro spot radiograph confirms appropriate catheter position. The catheter was flushed and covered with a sterile dressing. Catheter length: 40 cm IMPRESSION: Successful RIGHT arm power PICC line placement with ultrasound and fluoroscopic guidance. The tip of the catheter is positioned at the superior cavo-atrial junction. the catheter is ready for use. Performed by: Wash Hack Electronically Signed   By: Art Largo M.D.   On: 01/31/2024 11:53      No orders of the defined types were placed in this encounter.  All questions were answered. The patient knows to call the clinic with any problems, questions or concerns. No barriers to learning was detected. The total time spent in the appointment was 30 minutes, including review of chart and various tests results, discussions about plan of care and coordination of care plan     Sonja [place], MD 01/31/2024

## 2024-02-01 ENCOUNTER — Other Ambulatory Visit: Payer: Self-pay

## 2024-02-01 ENCOUNTER — Ambulatory Visit
Admission: RE | Admit: 2024-02-01 | Discharge: 2024-02-01 | Disposition: A | Discharge status: Home / Self Care | Source: Ambulatory Visit | Attending: Radiation Oncology | Admitting: Radiation Oncology

## 2024-02-01 LAB — RAD ONC ARIA SESSION SUMMARY
Course Elapsed Days: 1
Plan Fractions Treated to Date: 2
Plan Prescribed Dose Per Fraction: 1.8 Gy
Plan Total Fractions Prescribed: 30
Plan Total Prescribed Dose: 54 Gy
Reference Point Dosage Given to Date: 3.6 Gy
Reference Point Session Dosage Given: 1.8 Gy
Session Number: 2

## 2024-02-02 ENCOUNTER — Inpatient Hospital Stay (HOSPITAL_COMMUNITY)
Admission: EM | Admit: 2024-02-02 | Discharge: 2024-02-04 | DRG: 286 | Disposition: A | Discharge status: Home / Self Care | Attending: Internal Medicine | Admitting: Internal Medicine

## 2024-02-02 ENCOUNTER — Telehealth: Payer: Self-pay

## 2024-02-02 ENCOUNTER — Other Ambulatory Visit: Payer: Self-pay

## 2024-02-02 ENCOUNTER — Ambulatory Visit

## 2024-02-02 ENCOUNTER — Telehealth: Payer: Self-pay | Admitting: Radiation Oncology

## 2024-02-02 ENCOUNTER — Emergency Department (HOSPITAL_COMMUNITY)

## 2024-02-02 ENCOUNTER — Encounter (HOSPITAL_COMMUNITY): Payer: Self-pay

## 2024-02-02 ENCOUNTER — Observation Stay (HOSPITAL_COMMUNITY)

## 2024-02-02 DIAGNOSIS — R079 Chest pain, unspecified: Principal | ICD-10-CM | POA: Diagnosis present

## 2024-02-02 DIAGNOSIS — C218 Malignant neoplasm of overlapping sites of rectum, anus and anal canal: Secondary | ICD-10-CM | POA: Diagnosis present

## 2024-02-02 DIAGNOSIS — I214 Non-ST elevation (NSTEMI) myocardial infarction: Secondary | ICD-10-CM

## 2024-02-02 DIAGNOSIS — D649 Anemia, unspecified: Secondary | ICD-10-CM | POA: Diagnosis not present

## 2024-02-02 DIAGNOSIS — R0781 Pleurodynia: Secondary | ICD-10-CM | POA: Diagnosis not present

## 2024-02-02 DIAGNOSIS — I700 Atherosclerosis of aorta: Secondary | ICD-10-CM | POA: Diagnosis not present

## 2024-02-02 DIAGNOSIS — D61810 Antineoplastic chemotherapy induced pancytopenia: Secondary | ICD-10-CM | POA: Diagnosis not present

## 2024-02-02 DIAGNOSIS — I309 Acute pericarditis, unspecified: Principal | ICD-10-CM | POA: Diagnosis present

## 2024-02-02 DIAGNOSIS — R739 Hyperglycemia, unspecified: Secondary | ICD-10-CM | POA: Diagnosis present

## 2024-02-02 DIAGNOSIS — F32A Depression, unspecified: Secondary | ICD-10-CM | POA: Diagnosis present

## 2024-02-02 DIAGNOSIS — I2510 Atherosclerotic heart disease of native coronary artery without angina pectoris: Secondary | ICD-10-CM | POA: Diagnosis not present

## 2024-02-02 DIAGNOSIS — N179 Acute kidney failure, unspecified: Secondary | ICD-10-CM | POA: Diagnosis present

## 2024-02-02 DIAGNOSIS — R54 Age-related physical debility: Secondary | ICD-10-CM | POA: Diagnosis present

## 2024-02-02 DIAGNOSIS — Z8711 Personal history of peptic ulcer disease: Secondary | ICD-10-CM

## 2024-02-02 DIAGNOSIS — Z96652 Presence of left artificial knee joint: Secondary | ICD-10-CM | POA: Diagnosis present

## 2024-02-02 DIAGNOSIS — M79661 Pain in right lower leg: Secondary | ICD-10-CM | POA: Diagnosis present

## 2024-02-02 DIAGNOSIS — C210 Malignant neoplasm of anus, unspecified: Secondary | ICD-10-CM | POA: Diagnosis present

## 2024-02-02 DIAGNOSIS — R7989 Other specified abnormal findings of blood chemistry: Secondary | ICD-10-CM | POA: Diagnosis not present

## 2024-02-02 DIAGNOSIS — Z79899 Other long term (current) drug therapy: Secondary | ICD-10-CM

## 2024-02-02 DIAGNOSIS — R000 Tachycardia, unspecified: Secondary | ICD-10-CM | POA: Diagnosis present

## 2024-02-02 DIAGNOSIS — Z885 Allergy status to narcotic agent status: Secondary | ICD-10-CM

## 2024-02-02 DIAGNOSIS — Z8249 Family history of ischemic heart disease and other diseases of the circulatory system: Secondary | ICD-10-CM

## 2024-02-02 DIAGNOSIS — E7800 Pure hypercholesterolemia, unspecified: Secondary | ICD-10-CM | POA: Diagnosis present

## 2024-02-02 DIAGNOSIS — I10 Essential (primary) hypertension: Secondary | ICD-10-CM | POA: Diagnosis present

## 2024-02-02 DIAGNOSIS — R253 Fasciculation: Secondary | ICD-10-CM | POA: Diagnosis present

## 2024-02-02 DIAGNOSIS — R7303 Prediabetes: Secondary | ICD-10-CM | POA: Diagnosis present

## 2024-02-02 DIAGNOSIS — Z808 Family history of malignant neoplasm of other organs or systems: Secondary | ICD-10-CM

## 2024-02-02 DIAGNOSIS — C774 Secondary and unspecified malignant neoplasm of inguinal and lower limb lymph nodes: Secondary | ICD-10-CM | POA: Diagnosis present

## 2024-02-02 DIAGNOSIS — Z85828 Personal history of other malignant neoplasm of skin: Secondary | ICD-10-CM

## 2024-02-02 DIAGNOSIS — K5180 Other ulcerative colitis without complications: Secondary | ICD-10-CM | POA: Diagnosis present

## 2024-02-02 DIAGNOSIS — I319 Disease of pericardium, unspecified: Secondary | ICD-10-CM

## 2024-02-02 DIAGNOSIS — Z923 Personal history of irradiation: Secondary | ICD-10-CM

## 2024-02-02 DIAGNOSIS — Z96643 Presence of artificial hip joint, bilateral: Secondary | ICD-10-CM | POA: Diagnosis present

## 2024-02-02 LAB — I-STAT CHEM 8, ED
BUN: 26 mg/dL — ABNORMAL HIGH (ref 8–23)
Calcium, Ion: 1.13 mmol/L — ABNORMAL LOW (ref 1.15–1.40)
Chloride: 103 mmol/L (ref 98–111)
Creatinine, Ser: 1.3 mg/dL — ABNORMAL HIGH (ref 0.44–1.00)
Glucose, Bld: 153 mg/dL — ABNORMAL HIGH (ref 70–99)
HCT: 32 % — ABNORMAL LOW (ref 36.0–46.0)
Hemoglobin: 10.9 g/dL — ABNORMAL LOW (ref 12.0–15.0)
Potassium: 3.5 mmol/L (ref 3.5–5.1)
Sodium: 137 mmol/L (ref 135–145)
TCO2: 22 mmol/L (ref 22–32)

## 2024-02-02 LAB — CBC WITH DIFFERENTIAL/PLATELET
Abs Immature Granulocytes: 0.02 10*3/uL (ref 0.00–0.07)
Basophils Absolute: 0 10*3/uL (ref 0.0–0.1)
Basophils Relative: 0 %
Eosinophils Absolute: 0 10*3/uL (ref 0.0–0.5)
Eosinophils Relative: 0 %
HCT: 34.8 % — ABNORMAL LOW (ref 36.0–46.0)
Hemoglobin: 11.5 g/dL — ABNORMAL LOW (ref 12.0–15.0)
Immature Granulocytes: 0 %
Lymphocytes Relative: 14 %
Lymphs Abs: 0.7 10*3/uL (ref 0.7–4.0)
MCH: 28.9 pg (ref 26.0–34.0)
MCHC: 33 g/dL (ref 30.0–36.0)
MCV: 87.4 fL (ref 80.0–100.0)
Monocytes Absolute: 0.4 10*3/uL (ref 0.1–1.0)
Monocytes Relative: 8 %
Neutro Abs: 3.9 10*3/uL (ref 1.7–7.7)
Neutrophils Relative %: 78 %
Platelets: 198 10*3/uL (ref 150–400)
RBC: 3.98 MIL/uL (ref 3.87–5.11)
RDW: 13.2 % (ref 11.5–15.5)
WBC: 5.1 10*3/uL (ref 4.0–10.5)
nRBC: 0 % (ref 0.0–0.2)

## 2024-02-02 LAB — HEMOGLOBIN A1C
Hgb A1c MFr Bld: 5.7 % — ABNORMAL HIGH (ref 4.8–5.6)
Mean Plasma Glucose: 116.89 mg/dL

## 2024-02-02 LAB — COMPREHENSIVE METABOLIC PANEL WITH GFR
ALT: 19 U/L (ref 0–44)
AST: 26 U/L (ref 15–41)
Albumin: 3.8 g/dL (ref 3.5–5.0)
Alkaline Phosphatase: 46 U/L (ref 38–126)
Anion gap: 11 (ref 5–15)
BUN: 26 mg/dL — ABNORMAL HIGH (ref 8–23)
CO2: 21 mmol/L — ABNORMAL LOW (ref 22–32)
Calcium: 10 mg/dL (ref 8.9–10.3)
Chloride: 105 mmol/L (ref 98–111)
Creatinine, Ser: 1.23 mg/dL — ABNORMAL HIGH (ref 0.44–1.00)
GFR, Estimated: 48 mL/min — ABNORMAL LOW (ref >60)
Glucose, Bld: 140 mg/dL — ABNORMAL HIGH (ref 70–99)
Potassium: 3.9 mmol/L (ref 3.5–5.1)
Sodium: 137 mmol/L (ref 135–145)
Total Bilirubin: 0.8 mg/dL (ref 0.0–1.2)
Total Protein: 7 g/dL (ref 6.5–8.1)

## 2024-02-02 LAB — TROPONIN I (HIGH SENSITIVITY)
Troponin I (High Sensitivity): 32 ng/L — ABNORMAL HIGH (ref <18)
Troponin I (High Sensitivity): 27 ng/L — ABNORMAL HIGH (ref <18)
Troponin I (High Sensitivity): 33 ng/L — ABNORMAL HIGH (ref <18)

## 2024-02-02 LAB — HIV ANTIBODY (ROUTINE TESTING W REFLEX): HIV Screen 4th Generation wRfx: NONREACTIVE

## 2024-02-02 LAB — SEDIMENTATION RATE: Sed Rate: 20 mm/h (ref 0–22)

## 2024-02-02 LAB — C-REACTIVE PROTEIN: CRP: 0.6 mg/dL (ref <1.0)

## 2024-02-02 LAB — BRAIN NATRIURETIC PEPTIDE: B Natriuretic Peptide: 44.3 pg/mL (ref 0.0–100.0)

## 2024-02-02 LAB — LIPASE, BLOOD: Lipase: 39 U/L (ref 11–51)

## 2024-02-02 MED ORDER — NITROGLYCERIN 0.4 MG SL SUBL
0.4000 mg | SUBLINGUAL_TABLET | SUBLINGUAL | Status: DC | PRN
  Administered 2024-02-02 – 2024-02-03 (×6): 0.4 mg via SUBLINGUAL
  Filled 2024-02-02 (×4): qty 1

## 2024-02-02 MED ORDER — FENTANYL CITRATE PF 50 MCG/ML IJ SOSY
50.0000 ug | PREFILLED_SYRINGE | INTRAMUSCULAR | Status: DC | PRN
  Administered 2024-02-02 – 2024-02-03 (×2): 50 ug via INTRAVENOUS
  Filled 2024-02-02 (×2): qty 1

## 2024-02-02 MED ORDER — PAROXETINE HCL 10 MG PO TABS
10.0000 mg | ORAL_TABLET | Freq: Every day | ORAL | Status: DC
Start: 2024-02-02 — End: 2024-02-04
  Administered 2024-02-02 – 2024-02-04 (×3): 10 mg via ORAL
  Filled 2024-02-02 (×4): qty 1

## 2024-02-02 MED ORDER — ONDANSETRON HCL 4 MG/2ML IJ SOLN
4.0000 mg | Freq: Four times a day (QID) | INTRAMUSCULAR | Status: DC | PRN
  Administered 2024-02-02: 4 mg via INTRAVENOUS
  Filled 2024-02-02 (×2): qty 2

## 2024-02-02 MED ORDER — FENTANYL CITRATE PF 50 MCG/ML IJ SOSY
50.0000 ug | PREFILLED_SYRINGE | Freq: Once | INTRAMUSCULAR | Status: AC
  Administered 2024-02-02: 50 ug via INTRAVENOUS
  Filled 2024-02-02: qty 1

## 2024-02-02 MED ORDER — ATORVASTATIN CALCIUM 40 MG PO TABS
40.0000 mg | ORAL_TABLET | Freq: Every day | ORAL | Status: DC
  Administered 2024-02-02 – 2024-02-04 (×3): 40 mg via ORAL
  Filled 2024-02-02 (×3): qty 1

## 2024-02-02 MED ORDER — IOHEXOL 350 MG/ML SOLN
65.0000 mL | Freq: Once | INTRAVENOUS | Status: AC | PRN
  Administered 2024-02-02: 65 mL via INTRAVENOUS

## 2024-02-02 MED ORDER — TRIAMTERENE-HCTZ 37.5-25 MG PO TABS
1.0000 | ORAL_TABLET | Freq: Every day | ORAL | Status: DC
  Administered 2024-02-02: 1 via ORAL
  Filled 2024-02-02: qty 1

## 2024-02-02 MED ORDER — ACETAMINOPHEN 325 MG PO TABS
650.0000 mg | ORAL_TABLET | ORAL | Status: DC | PRN
  Filled 2024-02-02: qty 2

## 2024-02-02 MED ORDER — LACTATED RINGERS IV BOLUS
1000.0000 mL | Freq: Once | INTRAVENOUS | Status: AC
  Administered 2024-02-02: 1000 mL via INTRAVENOUS

## 2024-02-02 MED ORDER — NALOXONE HCL 0.4 MG/ML IJ SOLN: 0.4000 mg | INTRAMUSCULAR | Status: DC | PRN

## 2024-02-02 MED ORDER — ONDANSETRON HCL 4 MG/2ML IJ SOLN
4.0000 mg | INTRAMUSCULAR | Status: DC | PRN
  Administered 2024-02-03 – 2024-02-04 (×7): 4 mg via INTRAVENOUS
  Filled 2024-02-02 (×7): qty 2

## 2024-02-02 MED ORDER — KETOROLAC TROMETHAMINE 15 MG/ML IJ SOLN
15.0000 mg | Freq: Once | INTRAMUSCULAR | Status: AC
  Administered 2024-02-02: 15 mg via INTRAVENOUS
  Filled 2024-02-02: qty 1

## 2024-02-02 MED ORDER — LOSARTAN POTASSIUM 50 MG PO TABS
25.0000 mg | ORAL_TABLET | Freq: Every day | ORAL | Status: DC
Start: 2024-02-02 — End: 2024-02-03
  Administered 2024-02-02: 25 mg via ORAL
  Filled 2024-02-02: qty 1

## 2024-02-02 MED ORDER — EZETIMIBE 10 MG PO TABS
10.0000 mg | ORAL_TABLET | Freq: Every day | ORAL | Status: DC
  Administered 2024-02-02 – 2024-02-04 (×3): 10 mg via ORAL
  Filled 2024-02-02 (×3): qty 1

## 2024-02-02 MED ORDER — ONDANSETRON HCL 4 MG/2ML IJ SOLN
4.0000 mg | Freq: Once | INTRAMUSCULAR | Status: AC
  Administered 2024-02-02: 4 mg via INTRAVENOUS
  Filled 2024-02-02: qty 2

## 2024-02-02 MED ORDER — ENOXAPARIN SODIUM 40 MG/0.4ML IJ SOSY
40.0000 mg | PREFILLED_SYRINGE | INTRAMUSCULAR | Status: DC
Start: 2024-02-02 — End: 2024-02-03
  Administered 2024-02-02: 40 mg via SUBCUTANEOUS
  Filled 2024-02-02: qty 0.4

## 2024-02-02 NOTE — H&P (Signed)
 History and Physical    Patient: Andrea [name]:[id number] DOB: 1956/06/08 DOA: 02/02/2024 DOS: the patient was seen and examined on 02/02/2024 PCP: Alejandro Hurt, FNP  Patient coming from: Home  Chief Complaint:  Chief Complaint  Patient presents with   Chest Pain   HPI: Andrea [name] is a 68 y.o. female with medical history significant of ulcerative colitis, anal cancer on chemotherapy and radiation, chronic anemia, HLD, HTN, depression presenting to the ED with chest pain.  Patient reports that she recently had a PICC line placed on Monday for her chemotherapy and radiation.  She is on 5-FU and mitomycin for therapy.  She states that she was in her usual state of health until yesterday morning.  She awakened with mild substernal chest pain.  She states that her chest pain was about a 3 out of 10 in severity and stayed this way throughout the day.  She did not have any radiation of her chest pain.  States that it felt like someone was sitting on her chest.  She otherwise felt fine throughout the day and went to bed.  This morning she awakened again with worsening chest pain, now 6 out of 10 in severity and still without any radiation.  She called her cancer clinic and was advised to come to the ED for further evaluation.  She called EMS.  En route to the ED, she was given aspirin  and sublingual nitroglycerin  which helped relieve her chest pain.  Upon arrival to the ED, her chest pain recurred and was around about a 9 out of 10 in severity.  She again received nitroglycerin  and IV pain medicines in the ED with relief of her chest pain.  Since that time, she has remained chest pain-free.  She does endorse associated nausea and 1 episode of NBNB vomiting earlier today.  She otherwise denies any fevers, chills, palpitations, shortness of breath, cough, abdominal pain, urinary changes.  Of note, patient had a superficial squamous cell carcinoma resected on 04/2022.  She had rectal discomfort and  a biopsy performed on a 2.3 cm mass on 12/2023 that confirmed invasive SCC.  PET scan showed stage III anal cancer.  She was ultimately started on chemoradiation with 5-FU and mitomycin on 01/31/2024.  She follows Dr. Maryalice Smaller with oncology.  ED course: Vital signs stable.  CBC with mild anemia around her baseline, otherwise unremarkable.  CMP with glucose 140, creatinine 1.23 (baseline around 1.05).  Lipase normal.  BNP normal.  Troponin 32 > 27.  EKG with sinus rhythm.  CTA chest without any evidence of PE.  Cardiology consulted by ED provider.  [facility] asked to evaluate patient for admission.  Review of Systems: As mentioned in the history of present illness. All other systems reviewed and are negative. Past Medical History:  Diagnosis Date   Anemia    Anginal pain (HCC)    Arthritis    Chest discomfort    Disorder of immune system (HCC)    Dyspnea    Dyspnea on exertion    Hypercholesterolemia    Hypertension    Migraine    Non-ulcer dyspepsia    Osteopenia of spine    Peptic ulcer disease    Pneumonia    Polyp of rectum    cancerous   Pre-diabetes    Skin cancer    Ulcerative colitis (HCC)    Past Surgical History:  Procedure Laterality Date   ANKLE FRACTURE SURGERY Left    CARDIAC CATHETERIZATION  COLONOSCOPY     ESOPHAGOGASTRODUODENOSCOPY     hip replacement Bilateral    LEFT HEART CATH AND CORONARY ANGIOGRAPHY N/A 01/21/2022   Procedure: LEFT HEART CATH AND CORONARY ANGIOGRAPHY;  Surgeon: Odie Benne, MD;  Location: [facility];  Service: Cardiovascular;  Laterality: N/A;   RECTAL BIOPSY N/A 05/04/2022   Procedure: BIOPSY RECTAL VS EXCISION OF PERIANAL LESION UNDER ANOSCOPY;  Surgeon: Melvenia Stabs, MD;  Location: [facility];  Service: General;  Laterality: N/A;   TOTAL KNEE ARTHROPLASTY Left 10/19/2022   Procedure: LEFT TOTAL KNEE ARTHROPLASTY;  Surgeon: Wes Hamman, MD;  Location: [facility];  Service: Orthopedics;  Laterality: Left;   TUBAL LIGATION      TUMOR EXCISION N/A 01/14/2024   Procedure: TRANSRECTAL TUMOR EXCISION;  Surgeon: Melvenia Stabs, MD;  Location: [facility];  Service: General;  Laterality: N/A;  Transanal excision of distal rectal and anal lesion (wide local)   Social History:  reports that she has never smoked. She has never used smokeless tobacco. She reports that she does not drink alcohol and does not use drugs.  Allergies  Allergen Reactions   Codeine Nausea And Vomiting    Family History  Problem Relation Age of Onset   CAD Mother 30   Heart attack Mother    Heart attack Father 59   CAD Brother 21   Skin cancer Brother    Other Son        gluten sensitivity   Colon cancer Neg Hx    Esophageal cancer Neg Hx    Rectal cancer Neg Hx    Stomach cancer Neg Hx     Prior to Admission medications   Medication Sig Start Date End Date Taking? Authorizing Provider  acetaminophen  (TYLENOL ) 500 MG tablet Take 1,000 mg by mouth every 6 (six) hours as needed for moderate pain.   Yes [provider]  albuterol  (VENTOLIN  HFA) 108 (90 Base) MCG/ACT inhaler Inhale 1-2 puffs into the lungs every 4 (four) hours as needed for wheezing or shortness of breath. 06/20/23  Yes Baity, Rankin Buzzard, NP  ammonium lactate (LAC-HYDRIN) 12 % lotion Apply 1 Application topically daily.   Yes [provider]  atorvastatin  (LIPITOR) 80 MG tablet TAKE ONE TABLET BY MOUTH DAILY Patient taking differently: Take 80 mg by mouth daily. Take 1/2 tablet daily 02/04/23  Yes Turner, Rufus Council, MD  ezetimibe  (ZETIA ) 10 MG tablet TAKE 1 TABLET BY MOUTH DAILY 04/13/23  Yes Turner, Rufus Council, MD  fluticasone  (FLONASE ) 50 MCG/ACT nasal spray Place 2 sprays into both nostrils daily. Patient taking differently: Place 2 sprays into both nostrils daily as needed for allergies. 11/26/17  Yes Yu, Amy V, PA-C  losartan  (COZAAR ) 25 MG tablet Take 25 mg by mouth daily. 09/17/22  Yes [provider]  nitroGLYCERIN  (NITROSTAT ) 0.4 MG  SL tablet Place 1 tablet (0.4 mg total) under the tongue every 5 (five) minutes as needed for chest pain. 10/29/23 02/02/24 Yes Turner, Rufus Council, MD  ondansetron  (ZOFRAN ) 8 MG tablet Take 1 tablet (8 mg total) by mouth every 8 (eight) hours as needed for nausea or vomiting. 01/24/24  Yes Sonja , MD  PARoxetine (PAXIL) 20 MG tablet Take 10 mg by mouth daily.   Yes [provider]  SUMAtriptan (IMITREX) 25 MG tablet Take 25 mg by mouth every 2 (two) hours as needed for migraine.   Yes [provider]  triamterene-hydrochlorothiazide (MAXZIDE-25) 37.5-25 MG tablet Take 1 tablet by  mouth daily.   Yes [provider]  vedolizumab  (ENTYVIO ) 300 MG injection Inject 300 mg into the vein every 8 (eight) weeks.   Yes [provider]  prochlorperazine  (COMPAZINE ) 10 MG tablet Take 1 tablet (10 mg total) by mouth every 6 (six) hours as needed for nausea or vomiting. 01/24/24   Sonja Hiller, MD    Physical Exam: Vitals:   02/02/24 1341 02/02/24 1400 02/02/24 1430 02/02/24 1445  BP:  (!) 152/64 (!) 169/75   Pulse: 93   87  Resp: 19 16 19  (!) 22  Temp:      SpO2: 97%   100%  Weight:      Height:       Physical Exam Constitutional:      Appearance: Normal appearance. She is not ill-appearing.  HENT:     Head: Normocephalic and atraumatic.     Mouth/Throat:     Mouth: Mucous membranes are moist.     Pharynx: Oropharynx is clear. No oropharyngeal exudate.  Eyes:     General: No scleral icterus.    Extraocular Movements: Extraocular movements intact.     Conjunctiva/sclera: Conjunctivae normal.     Pupils: Pupils are equal, round, and reactive to light.  Cardiovascular:     Rate and Rhythm: Normal rate and regular rhythm.     Pulses: Normal pulses.     Heart sounds: Normal heart sounds. No murmur heard.    No friction rub. No gallop.  Pulmonary:     Effort: Pulmonary effort is normal. No respiratory distress.     Breath sounds: Normal breath sounds. No wheezing,  rhonchi or rales.  Abdominal:     General: Bowel sounds are normal. There is no distension.     Palpations: Abdomen is soft.     Tenderness: There is no abdominal tenderness. There is no guarding or rebound.  Musculoskeletal:        General: No swelling. Normal range of motion.     Cervical back: Normal range of motion.  Skin:    General: Skin is warm and dry.  Neurological:     General: No focal deficit present.     Mental Status: She is alert and oriented to person, place, and time.  Psychiatric:        Mood and Affect: Mood normal.        Behavior: Behavior normal.     Data Reviewed:  There are no new results to review at this time.    Latest Ref Rng & Units 02/02/2024   11:03 AM 01/31/2024    1:52 PM 01/20/2024    2:50 PM  CBC  WBC 4.0 - 10.5 K/uL 5.1  6.9  6.0   Hemoglobin 12.0 - 15.0 g/dL 96.0  45.4  09.8   Hematocrit 36.0 - 46.0 % 34.8  31.6  33.0   Platelets 150 - 400 K/uL 198  230  250       Latest Ref Rng & Units 02/02/2024   11:03 AM 01/31/2024    1:52 PM 01/20/2024    2:50 PM  CMP  Glucose 70 - 99 mg/dL 119  99  93   BUN 8 - 23 mg/dL 26  23  23    Creatinine 0.44 - 1.00 mg/dL 1.47  8.29  5.62   Sodium 135 - 145 mmol/L 137  137  140   Potassium 3.5 - 5.1 mmol/L 3.9  4.3  4.2   Chloride 98 - 111 mmol/L 105  106  108  CO2 22 - 32 mmol/L 21  26  27    Calcium  8.9 - 10.3 mg/dL 78.2  9.5  95.6   Total Protein 6.5 - 8.1 g/dL 7.0  7.1  7.4   Total Bilirubin 0.0 - 1.2 mg/dL 0.8  0.4  0.6   Alkaline Phos 38 - 126 U/L 46  87  61   AST 15 - 41 U/L 26  21  22    ALT 0 - 44 U/L 19  15  19     Lipase     Component Value Date/Time   LIPASE 39 02/02/2024 1103   BNP    Component Value Date/Time   BNP 44.3 02/02/2024 1103   Troponin 32 > 27  CT Angio Chest PE W and/or Wo Contrast Result Date: 02/02/2024 CLINICAL DATA:  Chest pain. EXAM: CT ANGIOGRAPHY CHEST WITH CONTRAST TECHNIQUE: Multidetector CT imaging of the chest was performed using the standard protocol during  bolus administration of intravenous contrast. Multiplanar CT image reconstructions and MIPs were obtained to evaluate the vascular anatomy. RADIATION DOSE REDUCTION: This exam was performed according to the departmental dose-optimization program which includes automated exposure control, adjustment of the mA and/or kV according to patient size and/or use of iterative reconstruction technique. CONTRAST:  65mL OMNIPAQUE  IOHEXOL  350 MG/ML SOLN COMPARISON:  Jan 25, 2024. FINDINGS: Cardiovascular: Satisfactory opacification of the pulmonary arteries to the segmental level. No evidence of pulmonary embolism. Normal heart size. No pericardial effusion. Mediastinum/Nodes: No enlarged mediastinal, hilar, or axillary lymph nodes. Thyroid gland, trachea, and esophagus demonstrate no significant findings. Lungs/Pleura: No pneumothorax or pleural effusion is noted. Mild biapical scarring is noted. Upper Abdomen: No acute abnormality. Musculoskeletal: No chest wall abnormality. No acute or significant osseous findings. Review of the MIP images confirms the above findings. IMPRESSION: No definite evidence of pulmonary embolus. Electronically Signed   By: Rosalene Colon M.D.   On: 02/02/2024 13:14    Assessment and Plan: No notes have been filed under this hospital service. Service: Hospitalist  Chest pain Elevated troponins Hx of nonobstructive CAD Patient presenting with progressive substernal chest pain since yesterday, improved with nitroglycerin . Chest pain has resolved since arrival to ED. Troponins 32 > 27. EKG without any acute ischemia. She had a LHC in 2023 with 20% proximal stenosis in RCA and LAD. Not on aspirin  due to history of UC. Cardiology consulted. Per cardiology, suspect mildly elevated troponins are due to demand ischemia and elevated BP. BNP normal. CTA chest negative for PE and type A dissection (good contrast timing).  -Cardiology following, appreciate assistance -resume home zetia  and  lipitor -f/u ECHO -f/u ESR, CRP -f/u lipid panel -telemetry -further management per cardiology -place in observation / cardiac telemetry -lovenox  for dvt ppx -heart healthy diet  HTN BP ranging in the 150s-160s/60s-70s while in ED. She is on losartan  25mg  daily and triamterene-hydrochlorothiazide 37.5-25mg  daily at home. Will resume home antihypertensives. - Resume home losartan  and triamterene-hydrochlorothiazide - Trend BP curve  Mild increase in Creatinine Patient with baseline Cr ~1.05. On admission, Cr. 1.23. She is having good urine output per patient, not oliguric. Given Cr has not increased by >0.3 and is not increased >1.5x baseline, this is not an AKI. She has received 1L bolus in the ED. Have encouraged PO fluid intake. Will trend kidney function to ensure not continuing to increase. -s/p 1L LR bolus in ED -trend kidney function -have resumed home losartan  and maxzide given not technically an AKI  Hyperglycemia -patient with no history of diabetes -  blood glucose 140 on CMP today -f/u A1c -trend CBGs every morning, goal 140-180  HLD -resume home zetia  and lipitor -f/u lipid panel  Chronic normocytic anemia Patient with chronic anemia with hemoglobin baseline around 11-12.  On admission, hemoglobin 11.5.  No reported bleeding events per patient. - Trend hemoglobin curve, transfuse if hemoglobin less than 7 and/or symptomatic from this - Follows oncology in the outpatient setting, presumably secondary to underlying anal cancer  Ulcerative colitis - Follows GI, Dr. Dominic Friendly, in the outpatient setting - Per last GI note, currently well-controlled on Entyvio   Stage IIIa anal cancer - Patient follows Dr. Maryalice Smaller with oncology in the outpatient setting - Currently on chemoradiation with 5-FU and mitomycin beginning this past Monday  Depression - Resume home paroxetine 10 mg daily    Advance Care Planning:   Code Status: Full Code   Consults: cardiology  Family  Communication: updated family at bedside  Severity of Illness: The appropriate patient status for this patient is OBSERVATION. Observation status is judged to be reasonable and necessary in order to provide the required intensity of service to ensure the patient's safety. The patient's presenting symptoms, physical exam findings, and initial radiographic and laboratory data in the context of their medical condition is felt to place them at decreased risk for further clinical deterioration. Furthermore, it is anticipated that the patient will be medically stable for discharge from the hospital within 2 midnights of admission.   Portions of this note were generated with Scientist, clinical (histocompatibility and immunogenetics). Dictation errors may occur despite best attempts at proofreading.  Author: Bobby Barton H Klark Vanderhoef, MD 02/02/2024 2:57 PM  For on call review [email or web address].

## 2024-02-02 NOTE — Telephone Encounter (Signed)
 Received call that pt is on the way to ED with chest discomfort and will not be able to make it to tx appt today. L2 tx team notified via phone.

## 2024-02-02 NOTE — ED Notes (Signed)
 Patient transported to CT

## 2024-02-02 NOTE — ED Notes (Signed)
 Phlebotomy to stick

## 2024-02-02 NOTE — Progress Notes (Addendum)
 [facility] night cross cover note:   I was notified by the patient's RN of the patient's request for resumption of her home frequency of prn Zofran , with the patient noting that she is on every 4 hours as needed Zofran  as an outpatient in the setting of underlying malignancy for which she is receiving chemotherapy.  I subsequently updated her existing order for prn Zofran  to reflect her outpatient frequency.  Additionally, the patient has received 2 doses of IV fentanyl  in the ED, with good response in terms of ensuing improvement in pain, and requests additional pain medication at this time, noting no existing orders for additional fentanyl  at this time.  I subsequently added fentanyl  50 mcg IV every 2 hours as needed for pain.   Additionally, patient's family requesting confirmation of placement of existing PICC line. For this I have ordered updated CXR to assess picc line placement and have also place IV team consult order, requesting assessment of positioning/functionality of existing picc line.   Update: I evaluated the patient at bedside, and discussed her case with her son, who was present at bedside.  The patient continues to experience substernal chest discomfort, which she conveys has a positional component, noting that the pain intensifies when laying supine, and improves when leaning forward.  She notes that the IV fentanyl  offer some transient, short-lived benefit, with the pain then subsequently returning.   Per chart review, cardiology has been consulted, and are considering potential pericarditis.  Cardiology feels that ACS is less likely, troponin remain mildly elevated in the range of 27-43, but have been flat. echocardiogram is ordered for the morning.  Will repeat ESR, CRP with morning labs.  Also ordered a dose of Toradol  15 mg IV x 1 now, and will evaluate for response to this NSAID.   Chest x-ray ordered this evening shows no evidence of acute cardiopulmonary process, including no  evidence of infiltrate, edema, effusion, or pneumothorax, with the latter appearing relevant given recent PICC line placement, with PICC terminus noted to be 1.8 cm distal to the junction of the superior vena cava and the right atrium.  Given the patient's history of malignancy, for which she recently was started on chemotherapy, it is notable that CTA chest showed no evidence of acute cardiopulmonary process, including no evidence of acute pulmonary embolism.  Given this recent initiation of chemotherapy, she may also be a risk for esophagitis, gastritis, duodenitis.  Will also try a GI cocktail and start her on scheduled IV Protonix.  Repeat troponin is also been ordered for the morning.    Update: patient reports improvement in her chart pain with iv toradol , and is now able to rest.  While awaiting additional evaluation for potential acute pericarditis, including result of echocardiogram, I have ordered indomethacin 25 mg p.o. 3 times daily, and will continue protonix, as above.     Camelia Cavalier, DO Hospitalist

## 2024-02-02 NOTE — Significant Event (Signed)
 Contacted by emergency physician to re-evaluate ECG performed 02/02/24 at 23:22 hours.  ECG demonstrates diffuse concave ST elevation, consistent with pericarditis given story.  HS-troponin in the equivocal range without any significant delta.  Patient is being admitted for pericarditis.  Low suspicion at this time for STEMI.  Informed current provider to repeat HS-troponin.  Patient is being admitted to the hospitalist service.

## 2024-02-02 NOTE — ED Provider Notes (Signed)
 11:35 PM I was notified that the patient was having ongoing chest pain.  I reviewed the patient's EKG there was a change that occurred about 4 hours or so ago.  Patient with ongoing symptoms.  Troponins thus far have not significantly elevated.  I discussed case with the cardiology fellow on-call Dr. Teofilo Fellers he independently interpreted the EKG thought more consistent with myopericarditis.  Agreed with current plan.   Albertus Hughs, DO 02/02/24 2335

## 2024-02-02 NOTE — Consult Note (Addendum)
 Cardiology Consultation   [name] ID: Andrea [name] MRN: [id number]; DOB: October 15, 1955  Admit date: 02/02/2024 Date of Consult: 02/02/2024  PCP:  Andrea Hurt, FNP   [facility] Providers Cardiologist:  Andrea Keas, MD        [name] Profile:   Andrea [name] is a 68 y.o. female with a hx of CAD, HTN, HLD, ulcerative colitis, PUD, anal cancer currently on chemotherapy who is being seen 02/02/2024 for Andrea evaluation of chest pain at Andrea request of Andrea Concha MD.  History of Present Illness:   Andrea [name] was referred to Dr Andrea [name] in 2020 for chest pain. A coronary CTA was done and had a calcium  score of 190 (89th percentile), and nonobstructive CAD in LAD, RCA, and OM1. Echo at that time showed normal LV function, normal RV function and normal valve function. Andrea [name] followed up on 12/2021 with midsternal chest tightness and shortness of breath. LHC on 01/2022 showed 20% proximal stenosis in RCA and LAD.   Andrea [name] had a superficial squamous cell carcinoma resected on 04/2022. She had rectal discomfort and had a biopsy performed on a 2-3cm mass on 12/2023 that confirmed invasive SCC. PET scan showed Stage III a rectal cancer. She started chemoradiation with 5-FU and mitomycin on 01/31/24. Also had PICC placed in right arm on 01/31/24.   Andrea [name] presented to Andrea ER on 02/02/24 for substernal chest pain that started yesterday. Andrea chest discomfort was initially mild but worsened this morning, Andrea [name] called her cancer clinic that recommended she call 911. EMS gave Andrea [name] 325 aspirin  and nitroglycerin  Andrea chest pain resolved with nitroglycerin . Andrea pain reoccurred in Andrea ER and was 9/10. Nitroglycerine and IV pain medications helped Andrea discomfort. Currently does not have chest pain. Andrea pain feels like a pressure and does not radiate. Has had nausea and one episode of vomiting. Chest pain is worse when lying down and better when sitting up. [name] denied that  chest pain was worse with exertion.  Denies shortness of breath, fever, chills, diaphoresis. Blood pressure in Andrea ER has been elevated.  Labs in Andrea ER on 02/02/24 showed Potassium 3.9 Creatinine 1.23 (slightly elevated) BUN 26 (slightly elevated) Hemoglobin 11.5 High sensitivity troponin 32 BNP 44.3  Pulmonary CTA showed no evidence of a PE EKG showed normal sinus rhythm with a rate of 90 and border line prolonged QTCB of 458   Past Medical History:  Diagnosis Date   Anemia    Anginal pain (HCC)    Arthritis    Chest discomfort    Disorder of immune system (HCC)    Dyspnea    Dyspnea on exertion    Hypercholesterolemia    Hypertension    Migraine    Non-ulcer dyspepsia    Osteopenia of spine    Peptic ulcer disease    Pneumonia    Polyp of rectum    cancerous   Pre-diabetes    Skin cancer    Ulcerative colitis (HCC)     Past Surgical History:  Procedure Laterality Date   ANKLE FRACTURE SURGERY Left    CARDIAC CATHETERIZATION     COLONOSCOPY     ESOPHAGOGASTRODUODENOSCOPY     hip replacement Bilateral    LEFT HEART CATH AND CORONARY ANGIOGRAPHY N/A 01/21/2022   Procedure: LEFT HEART CATH AND CORONARY ANGIOGRAPHY;  Surgeon: Andrea Benne, MD;  Location: [facility];  Service: Cardiovascular;  Laterality: N/A;   RECTAL BIOPSY N/A 05/04/2022  Procedure: BIOPSY RECTAL VS EXCISION OF PERIANAL LESION UNDER ANOSCOPY;  Surgeon: Andrea Stabs, MD;  Location: [facility];  Service: General;  Laterality: N/A;   TOTAL KNEE ARTHROPLASTY Left 10/19/2022   Procedure: LEFT TOTAL KNEE ARTHROPLASTY;  Surgeon: Andrea Hamman, MD;  Location: [facility];  Service: Orthopedics;  Laterality: Left;   TUBAL LIGATION     TUMOR EXCISION N/A 01/14/2024   Procedure: TRANSRECTAL TUMOR EXCISION;  Surgeon: Andrea Stabs, MD;  Location: [facility];  Service: General;  Laterality: N/A;  Transanal excision of distal rectal and anal lesion (wide local)     Home  Medications:  Prior to Admission medications   Medication Sig Start Date End Date Taking? Authorizing Provider  acetaminophen  (TYLENOL ) 500 MG tablet Take 1,000 mg by mouth every 6 (six) hours as needed for moderate pain.   Yes [provider]  albuterol  (VENTOLIN  HFA) 108 (90 Base) MCG/ACT inhaler Inhale 1-2 puffs into Andrea lungs every 4 (four) hours as needed for wheezing or shortness of breath. 06/20/23  Yes Baity, Rankin Buzzard, NP  nitroGLYCERIN  (NITROSTAT ) 0.4 MG SL tablet Place 1 tablet (0.4 mg total) under Andrea tongue every 5 (five) minutes as needed for chest pain. 10/29/23 02/02/24 Yes [name], Traci R, MD  ammonium lactate (LAC-HYDRIN) 12 % lotion Apply 1 Application topically daily.    [provider]  atorvastatin  (LIPITOR) 80 MG tablet TAKE ONE TABLET BY MOUTH DAILY [name] taking differently: Take 80 mg by mouth daily. Take 1/2 tablet daily 02/04/23   [name], Rufus Council, MD  ezetimibe  (ZETIA ) 10 MG tablet TAKE 1 TABLET BY MOUTH DAILY 04/13/23   Andrea Matsu, MD  fluticasone  (FLONASE ) 50 MCG/ACT nasal spray Place 2 sprays into both nostrils daily. [name] taking differently: Place 2 sprays into both nostrils daily as needed for allergies. 11/26/17   Andrea [name], Andrea [name], Andrea [name]  hydrocortisone  (ANUSOL -HC) 2.5 % rectal cream Place 1 Application rectally 2 (two) times daily. 11/09/23   Zehr, Andrea [name] [name], Andrea [name]  losartan  (COZAAR ) 25 MG tablet Take 25 mg by mouth daily. 09/17/22   [provider]  ondansetron  (ZOFRAN ) 8 MG tablet Take 1 tablet (8 mg total) by mouth every 8 (eight) hours as needed for nausea or vomiting. 01/24/24   Andrea Pahoa, MD  PARoxetine (PAXIL) 20 MG tablet Take 10 mg by mouth daily.    [provider]  prochlorperazine  (COMPAZINE ) 10 MG tablet Take 1 tablet (10 mg total) by mouth every 6 (six) hours as needed for nausea or vomiting. 01/24/24   Andrea Broadwater, MD  SUMAtriptan (IMITREX) 25 MG tablet Take 25 mg by mouth every 2 (two) hours as needed.    [provider]   triamterene-hydrochlorothiazide (MAXZIDE-25) 37.5-25 MG tablet Take 1 tablet by mouth daily.    [provider]  vedolizumab  (ENTYVIO ) 300 MG injection Inject 300 mg into Andrea vein every 8 (eight) weeks.    [provider]    Inpatient Medications: Scheduled Meds:  Continuous Infusions:  PRN Meds: nitroGLYCERIN   Allergies:    Allergies  Allergen Reactions   Codeine Nausea And Vomiting    Social History:   Social History   Socioeconomic History   Marital status: Divorced    Spouse name: Not on file   Number of children: 3   Years of education: Not on file   Highest education level: Not on file  Occupational History   Occupation: retired  Tobacco Use   Smoking status: Never   Smokeless tobacco:  Never  Vaping Use   Vaping status: Never Used  Substance and Sexual Activity   Alcohol use: No   Drug use: No   Sexual activity: Yes  Other Topics Concern   Not on file  Social History Narrative   Not on file   Social Drivers of Health   Financial Resource Strain: Not on file  Food Insecurity: No Food Insecurity (01/20/2024)   Hunger Vital Sign    Worried About Running Out of Food in Andrea Last Year: Never true    Ran Out of Food in Andrea Last Year: Never true  Transportation Needs: No Transportation Needs (01/20/2024)   PRAPARE - Administrator, Civil Service (Medical): No    Lack of Transportation (Non-Medical): No  Physical Activity: Not on file  Stress: Not on file  Social Connections: Unknown (01/26/2022)   [facility]   Social Network    Social Network: Not on file  Intimate Partner Violence: Not At Risk (01/20/2024)   Humiliation, Afraid, Rape, and Kick questionnaire    Fear of Current or Ex-Partner: No    Emotionally Abused: No    Physically Abused: No    Sexually Abused: No    Family History:    Family History  Problem Relation Age of Onset   CAD Mother 36   Heart attack Mother    Heart attack  Father 43   CAD Brother 82   Skin cancer Brother    Other Son        gluten sensitivity   Colon cancer Neg Hx    Esophageal cancer Neg Hx    Rectal cancer Neg Hx    Stomach cancer Neg Hx      ROS:  Please see Andrea history of present illness.   All other ROS reviewed and negative.     Physical Exam/Data:   Vitals:   02/02/24 1043 02/02/24 1300 02/02/24 1330 02/02/24 1341  BP: 126/69 (!) 156/78 (!) 154/63   Pulse: 81 94 89 93  Resp: 14 (!) 25 19 19   Temp: 97.7 F (36.5 C)     SpO2: 100% 100% 98% 97%  Weight:      Height:       No intake or output data in Andrea 24 hours ending 02/02/24 1352    02/02/2024   10:40 AM 01/31/2024    2:29 PM 01/25/2024    8:36 AM  Last 3 Weights  Weight (lbs) 172 lb 174 lb 4.8 oz 173 lb 2 oz  Weight (kg) 78.019 kg 79.062 kg 78.529 kg     Body mass index is 28.62 kg/m.  General:  Well nourished, well developed, in no acute distress HEENT: normal Neck: no JVD Vascular: positive carotid bruits L>[name]; Distal pulses 2+ bilaterally Cardiac:  normal S1, S2; RRR; no murmur, chest non-tender to palpation. Lungs:  clear to auscultation bilaterally, no wheezing, rhonchi or rales  Abd: soft, nontender, no hepatomegaly  Ext: no edema Musculoskeletal:  No deformities. Skin: warm and dry  Neuro:  no focal abnormalities noted Psych:  Normal affect   EKG:  Andrea EKG was personally reviewed and demonstrates:  EKG showed normal sinus rhythm with a rate of 90 and border line prolonged QTCB of 458.  Telemetry:  Telemetry was personally reviewed and demonstrates:  normal sinus rhythm in Andrea 80's  Relevant CV Studies: Echo pending  Laboratory Data:  High Sensitivity Troponin:   Recent Labs  Lab 02/02/24 1103  TROPONINIHS 32*  Chemistry Recent Labs  Lab 01/31/24 1352 02/02/24 1103  NA 137 137  K 4.3 3.9  CL 106 105  CO2 26 21*  GLUCOSE 99 140*  BUN 23 26*  CREATININE 1.05* 1.23*  CALCIUM  9.5 10.0  GFRNONAA 58* 48*  ANIONGAP 5 11    Recent  Labs  Lab 01/31/24 1352 02/02/24 1103  PROT 7.1 7.0  ALBUMIN 4.1 3.8  AST 21 26  ALT 15 19  ALKPHOS 87 46  BILITOT 0.4 0.8   Lipids No results for input(s): "CHOL", "TRIG", "HDL", "LABVLDL", "LDLCALC", "CHOLHDL" in Andrea last 168 hours.  Hematology Recent Labs  Lab 01/31/24 1352 02/02/24 1103  WBC 6.9 5.1  RBC 3.76* 3.98  HGB 10.9* 11.5*  HCT 31.6* 34.8*  MCV 84.0 87.4  MCH 29.0 28.9  MCHC 34.5 33.0  RDW 13.2 13.2  PLT 230 198   Thyroid No results for input(s): "TSH", "FREET4" in Andrea last 168 hours.  BNPNo results for input(s): "BNP", "PROBNP" in Andrea last 168 hours.  DDimer No results for input(s): "DDIMER" in Andrea last 168 hours.   Radiology/Studies:  CT Angio Chest PE W and/or Wo Contrast Result Date: 02/02/2024 CLINICAL DATA:  Chest pain. EXAM: CT ANGIOGRAPHY CHEST WITH CONTRAST TECHNIQUE: Multidetector CT imaging of Andrea chest was performed using Andrea standard protocol during bolus administration of intravenous contrast. Multiplanar CT image reconstructions and MIPs were obtained to evaluate Andrea vascular anatomy. RADIATION DOSE REDUCTION: This exam was performed according to Andrea departmental dose-optimization program which includes automated exposure control, adjustment of the mA and/or kV according to [name] size and/or use of iterative reconstruction technique. CONTRAST:  65mL OMNIPAQUE  IOHEXOL  350 MG/ML SOLN COMPARISON:  Jan 25, 2024. FINDINGS: Cardiovascular: Satisfactory opacification of Andrea pulmonary arteries to Andrea segmental level. No evidence of pulmonary embolism. Normal heart size. No pericardial effusion. Mediastinum/Nodes: No enlarged mediastinal, hilar, or axillary lymph nodes. Thyroid gland, trachea, and esophagus demonstrate no significant findings. Lungs/Pleura: No pneumothorax or pleural effusion is noted. Mild biapical scarring is noted. Upper Abdomen: No acute abnormality. Musculoskeletal: No chest wall abnormality. No acute or significant osseous findings. Review  of Andrea MIP images confirms Andrea above findings. IMPRESSION: No definite evidence of pulmonary embolus. Electronically Signed   By: Rosalene Colon M.[name].   On: 02/02/2024 13:14   IR PICC PLACEMENT RIGHT <5 YRS INC IMG GUIDE Result Date: 01/31/2024 INDICATION: [name] with history of anal cancer; central venous access requested for chemotherapy EXAM: RIGHT UPPER EXTREMITY PICC LINE PLACEMENT WITH ULTRASOUND AND FLUOROSCOPIC GUIDANCE MEDICATIONS: 4 mL 1% lidocaine  to skin and subcutaneous tissue ANESTHESIA/SEDATION: None FLUOROSCOPY: Radiation Exposure Index (as provided by Andrea fluoroscopic device): 1 mGy Kerma COMPLICATIONS: None immediate. PROCEDURE: Andrea [name] was advised of Andrea possible risks and complications and agreed to undergo Andrea procedure. Andrea [name] was then brought to Andrea angiographic suite for Andrea procedure. Andrea right arm was prepped with chlorhexidine , draped in Andrea usual sterile fashion using maximum barrier technique (cap and mask, sterile gown, sterile gloves, large sterile sheet, hand hygiene and cutaneous antisepsis) and infiltrated locally with 1% Lidocaine . Ultrasound demonstrated patency of Andrea right basilic vein, and this was documented with an image. Under real-time ultrasound guidance, this vein was accessed with a 21 gauge micropuncture needle and image documentation was performed. A 0.018 wire was introduced in to Andrea vein. Over this, a 5 [place] dual lumen power injectable PICC was advanced to Andrea lower SVC/right atrial junction. Fluoroscopy during Andrea procedure and fluoro spot radiograph confirms appropriate catheter  position. Andrea catheter was flushed and covered with a sterile dressing. Catheter length: 40 cm IMPRESSION: Successful RIGHT arm power PICC line placement with ultrasound and fluoroscopic guidance. Andrea tip of Andrea catheter is positioned at Andrea superior cavo-atrial junction. Andrea catheter is ready for use. Performed by: Wash Hack Electronically Signed   By: Art Largo  M.[name].   On: 01/31/2024 11:53     Assessment and Plan:   Andrea [name] is a 68 y.o. female with a hx of CAD, HTN, HLD, ulcerative colitis, PUD, anal cancer currently on chemotherapy who is being seen 02/02/2024 for Andrea evaluation of chest pain at Andrea request of Andrea Concha MD.  Chest pain Elevated troponins 32>27 HLD History of non obstructive CAD Presented to Andrea ER with substernal chest tightness since yesterday that was better with nitroglycerine. Said that chest discomfort is worse when laying down flat and better when sitting up. Is currently chest pain free. LHC on 01/2022 showed 20% proximal stenosis in RCA and LAD. EKG showed normal sinus rhythm with a rate of 90 and border line prolonged QTCB of 458.  Not on Aspirin  due to ulcerative colitis. Chest pain has typical ACS features but Andrea [name] also endorses pleuritic features. Suspect that mildly elevated and flat troponins are due to demand ischemia and elevated blood pressure. LDL 55 on 07/2022, Order lipid panel. Creatinine 1.23 -- Continue Zetia  10mg  and atorvastatin  40mg  daily. -- order echo -- Order ESR and CRP   Anal cancer Andrea [name] had a superficial squamous cell carcinoma resected on 04/2022. She had rectal discomfort and on 12/2023 had a biopsy performed on a 2-3cm mass that confirmed invasive SCC. PET scan showed Stage III a rectal cancer. She started chemoradiation with 5-FU and mitomycin on 01/31/24. Had PICC placed on 01/31/24  HTN BP in ER has been elevated Continue home losartan  25mg  daily and Maxide 37.5-25 mg daily.  AKI -- management per primary  Risk Assessment/Risk Scores:  {  For questions or updates, please contact [facility] Please consult [email or web address] for contact info under    Signed, Melita Springer, Andrea [name]  02/02/2024 1:52 PM

## 2024-02-02 NOTE — ED Notes (Signed)
 Pt family up to sort desk stating patient with worsening chest pain and labored breathing. RN to check on pt, pt with NAD noted, reports tightness in her chest is worsening. Endorses chest pain eased off with ems when given nitroglycerin .

## 2024-02-02 NOTE — Telephone Encounter (Signed)
 Pt called stating she's having severe chest pain.  Pt rated chest pain at 10/10.  Pt described pain as sharp constant pain.  Stated she experience the pain more on inhalation & exhalation.  Pt denied SOB.  Pt took BP while nurse on phone and BP was 142/72 which is normal for pt.  Placed pt on hold. Spoke with Rande Bushy, NP regarding pt's symptoms.  Heather recommended pt go to ED for further evaluation to r/o PE or Heart attack.  Returned to call with pt and friend.  Stated that Dr. Feng's NP would like for the pt to go to the ED for further evaluation.  Pt and friend verbalized understanding and had no further questions or concerns.  Notified Dr. Maryalice Smaller and Team along with Radiation Team.

## 2024-02-02 NOTE — ED Provider Notes (Signed)
 [facility]ERGENCY DEPARTMENT AT [facility] Provider Note   CSN: [id number] Arrival date & time: 02/02/24  1037     History  Chief Complaint  Patient presents with   Chest Pain    Andrea [name] is a 68 y.o. female.   Chest Pain    68 year old female with medical history significant peptic ulcer disease, ulcerative colitis, for anal cancer currently on chemotherapy who presents to the emergency department with substernal chest pain that started yesterday.  The patient endorses mild shortness of breath associated with the same.  She endorses worsening chest pain, described as 10 out of 10, did ease off with nitroglycerin  with EMS.  Patient had called her oncology office who directed her to the emergency department for chest pain evaluation.  Notes mention some concern with vasospasm associated with her 5-FU.  She denies any ripping tearing pain or radiation to the back, denies any lower extremity swelling, no history of PE or DVT.  No fevers, chills or cough.  Pain is ongoing.  Home Medications Prior to Admission medications   Medication Sig Start Date End Date Taking? Authorizing Provider  acetaminophen  (TYLENOL ) 500 MG tablet Take 1,000 mg by mouth every 6 (six) hours as needed for moderate pain.   Yes [provider]  albuterol  (VENTOLIN  HFA) 108 (90 Base) MCG/ACT inhaler Inhale 1-2 puffs into the lungs every 4 (four) hours as needed for wheezing or shortness of breath. 06/20/23  Yes Baity, Rankin Buzzard, NP  ammonium lactate (LAC-HYDRIN) 12 % lotion Apply 1 Application topically daily.   Yes [provider]  atorvastatin  (LIPITOR) 80 MG tablet TAKE ONE TABLET BY MOUTH DAILY Patient taking differently: Take 80 mg by mouth daily. Take 1/2 tablet daily 02/04/23  Yes Turner, Rufus Council, MD  ezetimibe  (ZETIA ) 10 MG tablet TAKE 1 TABLET BY MOUTH DAILY 04/13/23  Yes Turner, Rufus Council, MD  fluticasone  (FLONASE ) 50 MCG/ACT nasal spray Place 2 sprays into both nostrils  daily. Patient taking differently: Place 2 sprays into both nostrils daily as needed for allergies. 11/26/17  Yes Yu, Amy V, PA-C  losartan  (COZAAR ) 25 MG tablet Take 25 mg by mouth daily. 09/17/22  Yes [provider]  nitroGLYCERIN  (NITROSTAT ) 0.4 MG SL tablet Place 1 tablet (0.4 mg total) under the tongue every 5 (five) minutes as needed for chest pain. 10/29/23 02/02/24 Yes Turner, Rufus Council, MD  ondansetron  (ZOFRAN ) 8 MG tablet Take 1 tablet (8 mg total) by mouth every 8 (eight) hours as needed for nausea or vomiting. 01/24/24  Yes Sonja [place], MD  PARoxetine (PAXIL) 20 MG tablet Take 10 mg by mouth daily.   Yes [provider]  SUMAtriptan (IMITREX) 25 MG tablet Take 25 mg by mouth every 2 (two) hours as needed for migraine.   Yes [provider]  triamterene-hydrochlorothiazide (MAXZIDE-25) 37.5-25 MG tablet Take 1 tablet by mouth daily.   Yes [provider]  vedolizumab  (ENTYVIO ) 300 MG injection Inject 300 mg into the vein every 8 (eight) weeks.   Yes [provider]  prochlorperazine  (COMPAZINE ) 10 MG tablet Take 1 tablet (10 mg total) by mouth every 6 (six) hours as needed for nausea or vomiting. 01/24/24   Sonja Palmer, MD      Allergies    Codeine    Review of Systems   Review of Systems  Cardiovascular:  Positive for chest pain.  All other systems reviewed and are negative.   Physical Exam Updated Vital Signs BP (!) 152/64  Pulse 93   Temp 97.7 F (36.5 C)   Resp 16   Ht 5\' 5"  (1.651 m)   Wt 78 kg   SpO2 97%   BMI 28.62 kg/m  Physical Exam Vitals and nursing note reviewed.  Constitutional:      General: She is not in acute distress.    Appearance: She is well-developed.  HENT:     Head: Normocephalic and atraumatic.  Eyes:     Conjunctiva/sclera: Conjunctivae normal.  Cardiovascular:     Rate and Rhythm: Normal rate and regular rhythm.     Heart sounds: No murmur heard. Pulmonary:     Effort: Pulmonary effort is normal. No  respiratory distress.     Breath sounds: Normal breath sounds.  Chest:     Comments: Some reproducible sternal chest wall Tenderness to palpation Abdominal:     Palpations: Abdomen is soft.     Tenderness: There is no abdominal tenderness.  Musculoskeletal:        General: No swelling.     Cervical back: Neck supple.  Skin:    General: Skin is warm and dry.     Capillary Refill: Capillary refill takes less than 2 seconds.  Neurological:     Mental Status: She is alert.  Psychiatric:        Mood and Affect: Mood normal.     ED Results / Procedures / Treatments   Labs (all labs ordered are listed, but only abnormal results are displayed) Labs Reviewed  COMPREHENSIVE METABOLIC PANEL WITH GFR - Abnormal; Notable for the following components:      Result Value   CO2 21 (*)    Glucose, Bld 140 (*)    BUN 26 (*)    Creatinine, Ser 1.23 (*)    GFR, Estimated 48 (*)    All other components within normal limits  CBC WITH DIFFERENTIAL/PLATELET - Abnormal; Notable for the following components:   Hemoglobin 11.5 (*)    HCT 34.8 (*)    All other components within normal limits  TROPONIN I (HIGH SENSITIVITY) - Abnormal; Notable for the following components:   Troponin I (High Sensitivity) 32 (*)    All other components within normal limits  TROPONIN I (HIGH SENSITIVITY) - Abnormal; Notable for the following components:   Troponin I (High Sensitivity) 27 (*)    All other components within normal limits  LIPASE, BLOOD  BRAIN NATRIURETIC PEPTIDE    EKG EKG Interpretation Date/Time:  Wednesday Feb 02 2024 13:39:10 EDT Ventricular Rate:  90 PR Interval:  164 QRS Duration:  88 QT Interval:  374 QTC Calculation: 458 R Axis:   1  Text Interpretation: Sinus rhythm Probable anteroseptal infarct, old Confirmed by Rosealee Concha ([id number]) on 02/02/2024 2:33:56 PM  Radiology CT Angio Chest PE W and/or Wo Contrast Result Date: 02/02/2024 CLINICAL DATA:  Chest pain. EXAM: CT ANGIOGRAPHY  CHEST WITH CONTRAST TECHNIQUE: Multidetector CT imaging of the chest was performed using the standard protocol during bolus administration of intravenous contrast. Multiplanar CT image reconstructions and MIPs were obtained to evaluate the vascular anatomy. RADIATION DOSE REDUCTION: This exam was performed according to the departmental dose-optimization program which includes automated exposure control, adjustment of the mA and/or kV according to patient size and/or use of iterative reconstruction technique. CONTRAST:  65mL OMNIPAQUE  IOHEXOL  350 MG/ML SOLN COMPARISON:  Jan 25, 2024. FINDINGS: Cardiovascular: Satisfactory opacification of the pulmonary arteries to the segmental level. No evidence of pulmonary embolism. Normal heart size. No pericardial effusion. Mediastinum/Nodes: No enlarged  mediastinal, hilar, or axillary lymph nodes. Thyroid gland, trachea, and esophagus demonstrate no significant findings. Lungs/Pleura: No pneumothorax or pleural effusion is noted. Mild biapical scarring is noted. Upper Abdomen: No acute abnormality. Musculoskeletal: No chest wall abnormality. No acute or significant osseous findings. Review of the MIP images confirms the above findings. IMPRESSION: No definite evidence of pulmonary embolus. Electronically Signed   By: Rosalene Colon M.D.   On: 02/02/2024 13:14    Procedures Procedures    Medications Ordered in ED Medications  nitroGLYCERIN  (NITROSTAT ) SL tablet 0.4 mg (0.4 mg Sublingual Given 02/02/24 1336)  lactated ringers  bolus 1,000 mL (1,000 mLs Intravenous New Bag/Given 02/02/24 1317)  ondansetron  (ZOFRAN ) injection 4 mg (4 mg Intravenous Given 02/02/24 1230)  fentaNYL  (SUBLIMAZE ) injection 50 mcg (50 mcg Intravenous Given 02/02/24 1231)  iohexol  (OMNIPAQUE ) 350 MG/ML injection 65 mL (65 mLs Intravenous Contrast Given 02/02/24 1253)  fentaNYL  (SUBLIMAZE ) injection 50 mcg (50 mcg Intravenous Given 02/02/24 1333)    ED Course/ Medical Decision Making/ A&P              HEART Score: 5                    Medical Decision Making Amount and/or Complexity of Data Reviewed Labs: ordered. Radiology: ordered.  Risk Prescription drug management. Decision regarding hospitalization.    68 year old female with medical history significant peptic ulcer disease, ulcerative colitis, for anal cancer currently on chemotherapy who presents to the emergency department with substernal chest pain that started yesterday.  The patient endorses mild shortness of breath associated with the same.  She endorses worsening chest pain, described as 10 out of 10, did ease off with nitroglycerin  with EMS.  Patient had called her oncology office who directed her to the emergency department for chest pain evaluation.  Notes mention some concern with vasospasm associated with her 5-FU.  She denies any ripping tearing pain or radiation to the back, denies any lower extremity swelling, no history of PE or DVT.  No fevers, chills or cough.  Pain is ongoing.  Medical Decision Making: Andrea [name] is a 68 y.o. female who presented to the ED today with chest pain, detailed above.  Based on patient's comorbidities, patient has a heart score of 5.    Patient placed on continuous vitals and telemetry monitoring while in ED which was reviewed periodically.  Complete initial physical exam performed, notably the patient was CTAB, some reproducible chest wall TTP.   Reviewed and confirmed nursing documentation for past medical history, family history, social history.    Initial Assessment: With the patient's presentation of left-sided chest pain, most likely diagnosis is musculoskeletal chest pain versus vasospasm vs GERD, although ACS remains on the differential. Other diagnoses were considered including (but not limited to) pulmonary embolism, community-acquired pneumonia, aortic dissection, pneumothorax, underlying bony abnormality, anemia. These are considered less likely due to history of  present illness and physical exam findings.    In particular, concerning pulmonary embolism: Patient is PERC positive and the they currently have an ongoing malignancy making them wells moderate risk. Aortic Dissection also considered but seems less likely based on the location, quality, onset, and severity of symptoms in this case.   Initial Plan: Evaluate for ACS with delta troponin and EKG evaluated as below  Evaluate for dissection, bony abnormality, or pneumonia with chest x-ray and screening laboratory evaluation including CBC, BMP  Further evaluation for pulmonary embolism indicated at this time based on patient's PERC and  Wells score, will obtain CTA   Initial Study Results: EKG was reviewed independently. Rate, rhythm, axis, intervals all examined and without medically relevant abnormality. ST segments without concerns for elevations.    Laboratory  Delta troponin demonstrated mildly elevated but downtrending values at  32->27 BNP normal Lipase normal  CBC and CMP without obvious metabolic or inflammatory abnormalities requiring further evaluation, Cr mildly elevated at 1.23 from baseline around 0.8  Radiology  CT Angio Chest PE W and/or Wo Contrast Result Date: 02/02/2024 CLINICAL DATA:  Chest pain. EXAM: CT ANGIOGRAPHY CHEST WITH CONTRAST TECHNIQUE: Multidetector CT imaging of the chest was performed using the standard protocol during bolus administration of intravenous contrast. Multiplanar CT image reconstructions and MIPs were obtained to evaluate the vascular anatomy. RADIATION DOSE REDUCTION: This exam was performed according to the departmental dose-optimization program which includes automated exposure control, adjustment of the mA and/or kV according to patient size and/or use of iterative reconstruction technique. CONTRAST:  65mL OMNIPAQUE  IOHEXOL  350 MG/ML SOLN COMPARISON:  Jan 25, 2024. FINDINGS: Cardiovascular: Satisfactory opacification of the pulmonary arteries to the  segmental level. No evidence of pulmonary embolism. Normal heart size. No pericardial effusion. Mediastinum/Nodes: No enlarged mediastinal, hilar, or axillary lymph nodes. Thyroid gland, trachea, and esophagus demonstrate no significant findings. Lungs/Pleura: No pneumothorax or pleural effusion is noted. Mild biapical scarring is noted. Upper Abdomen: No acute abnormality. Musculoskeletal: No chest wall abnormality. No acute or significant osseous findings. Review of the MIP images confirms the above findings. IMPRESSION: No definite evidence of pulmonary embolus. Electronically Signed   By: Rosalene Colon M.D.   On: 02/02/2024 13:14   IR PICC PLACEMENT RIGHT <5 YRS INC IMG GUIDE Result Date: 01/31/2024 INDICATION: Patient with history of anal cancer; central venous access requested for chemotherapy EXAM: RIGHT UPPER EXTREMITY PICC LINE PLACEMENT WITH ULTRASOUND AND FLUOROSCOPIC GUIDANCE MEDICATIONS: 4 mL 1% lidocaine  to skin and subcutaneous tissue ANESTHESIA/SEDATION: None FLUOROSCOPY: Radiation Exposure Index (as provided by the fluoroscopic device): 1 mGy Kerma COMPLICATIONS: None immediate. PROCEDURE: The patient was advised of the possible risks and complications and agreed to undergo the procedure. The patient was then brought to the angiographic suite for the procedure. The right arm was prepped with chlorhexidine , draped in the usual sterile fashion using maximum barrier technique (cap and mask, sterile gown, sterile gloves, large sterile sheet, hand hygiene and cutaneous antisepsis) and infiltrated locally with 1% Lidocaine . Ultrasound demonstrated patency of the right basilic vein, and this was documented with an image. Under real-time ultrasound guidance, this vein was accessed with a 21 gauge micropuncture needle and image documentation was performed. A 0.018 wire was introduced in to the vein. Over this, a 5 [place] dual lumen power injectable PICC was advanced to the lower SVC/right atrial  junction. Fluoroscopy during the procedure and fluoro spot radiograph confirms appropriate catheter position. The catheter was flushed and covered with a sterile dressing. Catheter length: 40 cm IMPRESSION: Successful RIGHT arm power PICC line placement with ultrasound and fluoroscopic guidance. The tip of the catheter is positioned at the superior cavo-atrial junction. the catheter is ready for use. Performed by: Wash Hack Electronically Signed   By: Art Largo M.D.   On: 01/31/2024 11:53   NM PET Image Initial (PI) Skull Base To Thigh Result Date: 01/25/2024 CLINICAL DATA:  Initial treatment strategy for anal carcinoma. History of ulcerative colitis. EXAM: NUCLEAR MEDICINE PET SKULL BASE TO THIGH TECHNIQUE: 8.6 mCi F-18 FDG was injected intravenously. Full-ring PET imaging was performed from the  skull base to thigh after the radiotracer. CT data was obtained and used for attenuation correction and anatomic localization. Fasting blood glucose: 89 mg/dl COMPARISON:  Limited correlation made with cardiac CT 08/09/2019. FINDINGS: Mediastinal blood pool activity: SUV max 3.1 NECK: No hypermetabolic cervical lymph nodes are identified.Fairly symmetric activity within the lymphoid tissue of Waldeyer's ring is within physiologic limits. No suspicious activity identified within the pharyngeal mucosal space. Incidental CT findings: Mild left carotid atherosclerosis. CHEST: There are no hypermetabolic mediastinal, hilar or axillary lymph nodes. No hypermetabolic pulmonary activity or suspicious nodularity. Incidental CT findings: Biapical scarring noted with pleural calcifications along the superior aspect of the left major fissure. Mild central airway thickening. Atherosclerosis of the aorta, great vessels and coronary arteries noted. ABDOMEN/PELVIS: There is no hypermetabolic activity within the liver, adrenal glands, spleen or pancreas. Long segment anorectal hypermetabolic activity extends approximately 4 cm  in length and has an SUV max of 9.0. No other hypermetabolic activity identified within the bowel. There are hypermetabolic right inguinal and external iliac lymph nodes. The largest node in the right groin measures 1.7 cm short axis on image 168/4 and has an SUV max of 12.8. More proximal node measuring 1.2 cm on image 164/4 has an SUV max of 8.0. There is a right external iliac node measuring 9 mm short axis on image 156/4 which has an SUV max of 9.1. No definite hypermetabolic abdominal, left pelvic or left inguinal lymph nodes. Incidental CT findings: Aortic and branch vessel atherosclerosis. SKELETON: There is no hypermetabolic activity to suggest osseous metastatic disease. Incidental CT findings: Status post bilateral total hip arthroplasty with associated susceptibility artifact. Mild lumbar spondylosis. Incidental sacral Tarlov cyst noted. IMPRESSION: 1. Long segment anorectal hypermetabolic activity consistent with known primary malignancy. 2. Hypermetabolic right inguinal and external iliac lymph nodes consistent with nodal metastases. 3. No evidence of distant metastatic disease. 4.  Aortic Atherosclerosis (ICD10-I70.0). Electronically Signed   By: Elmon Hagedorn M.D.   On: 01/25/2024 14:39    Final Assessment and Plan: Cardiology consult placed due to ongoing severe chest pain, however reassuring that cardiac troponins are downtrending.  CTA PE study did not show evidence of acute PE and contrast bolus timing appropriate on my review and did not show evidence of ascending aortic dissection.  No evidence of pericarditis on EKG, considered medication side effect of the patient's chemotherapy as the etiology of the patient's presentation.  Cardiology consult and full recommendations pending at time of admission, hospitalist medicine consulted for admission given ongoing severe chest pain, considered musculoskeletal etiology, vasospasm, pericarditis.  Patient with some improvement in response to pain  management with fentanyl  and nitroglycerin , Dr. Consuela Denier of hospitalist medicine consulted and accepted the patient in admission.   Final Clinical Impression(s) / ED Diagnoses Final diagnoses:  Chest pain, unspecified type    Rx / DC Orders ED Discharge Orders     None         Rosealee Concha, MD 02/02/24 1950

## 2024-02-02 NOTE — ED Provider Notes (Signed)
 Patient is currently waiting for admission under the hospitalist but she started having severe chest pain after just getting up and changing her close.  They brought me an EKG that did show some changes.  Upon evaluating the patient she is having significant chest pain.  Blood pressure and heart rate are normal.  Patient had troponin done earlier today which was initially 30 and then 27.  Discussed with cardiology and had them evaluate the EKGs.  At this time they recommend pain control and repeat troponin.  After about 5 to 10 minutes of pain patient reports is now starting to improve.   Almond Army, MD 02/02/24 2012

## 2024-02-02 NOTE — ED Provider Triage Note (Signed)
 Emergency Medicine Provider Triage Evaluation Note  Andrea [name] , a 68 y.o. female  was evaluated in triage.  Pt complains of epigastric /  chest pain.  Review of Systems  Positive: Epigastric / Chest pain Negative: Patient denies fever, cough, shortness of breath, back pain  Physical Exam  BP 126/69 (BP Location: Left Arm)   Pulse 81   Temp 97.7 F (36.5 C)   Resp 14   Ht 5\' 5"  (1.651 m)   Wt 78 kg   SpO2 100%   BMI 28.62 kg/m  Gen:   Awake, no distress   Resp:  Normal effort  MSK:   Moves extremities without difficulty  Other:    Medical Decision Making  Medically screening exam initiated at 10:48 AM.  Appropriate orders placed.  RISE BOUIE was informed that the remainder of the evaluation will be completed by another provider, this initial triage assessment does not replace that evaluation, and the importance of remaining in the ED until their evaluation is complete.  Patient understands triage process.  She understands need to remain in the ED until completion of her evaluation.  Patient with stage IIIa Rectal cancer.  Patient is currently on radiation and chemo which started this week.  Additional prior medical history includes hypertension, sigmoiditis, anemia.  Patient with prior left heart catheterization in 01/2022 - proximal RCA lesion 20% stenosed, proximal LAD lesion was 20% stenosed, left ventricular systolic function was normal, left end-diastolic pressure was normal, left ventricular ejection fraction was 55 to 65%.  Mild nonobstructive CAD.  Normal LV systolic function.  Previously seen by Dr. Starr Eddy, Cardiology.   Burnette Carte, MD 02/02/24 1053

## 2024-02-02 NOTE — ED Triage Notes (Signed)
 Pt here for substernal chest pain that started yesterday. Pt started chemo and radiation on Monday for anal cancer stage 3. Denies SHOB. EMS gave nitroglycerin  and 324mg  of aspirin  and pt states CP feels better.

## 2024-02-02 NOTE — ED Notes (Signed)
 Admitting at bedside

## 2024-02-03 ENCOUNTER — Other Ambulatory Visit: Payer: Self-pay

## 2024-02-03 ENCOUNTER — Encounter: Payer: Self-pay | Admitting: Nurse Practitioner

## 2024-02-03 ENCOUNTER — Ambulatory Visit

## 2024-02-03 ENCOUNTER — Observation Stay (HOSPITAL_COMMUNITY)

## 2024-02-03 ENCOUNTER — Encounter (HOSPITAL_COMMUNITY)
Admission: EM | Disposition: A | Discharge status: Home / Self Care | Payer: Self-pay | Source: Home / Self Care | Attending: Internal Medicine

## 2024-02-03 ENCOUNTER — Encounter (HOSPITAL_COMMUNITY): Payer: Self-pay | Admitting: Cardiovascular Disease

## 2024-02-03 DIAGNOSIS — Z96643 Presence of artificial hip joint, bilateral: Secondary | ICD-10-CM | POA: Diagnosis present

## 2024-02-03 DIAGNOSIS — N179 Acute kidney failure, unspecified: Secondary | ICD-10-CM | POA: Diagnosis present

## 2024-02-03 DIAGNOSIS — Z96652 Presence of left artificial knee joint: Secondary | ICD-10-CM | POA: Diagnosis present

## 2024-02-03 DIAGNOSIS — R079 Chest pain, unspecified: Secondary | ICD-10-CM | POA: Diagnosis present

## 2024-02-03 DIAGNOSIS — Z85828 Personal history of other malignant neoplasm of skin: Secondary | ICD-10-CM | POA: Diagnosis not present

## 2024-02-03 DIAGNOSIS — F32A Depression, unspecified: Secondary | ICD-10-CM | POA: Diagnosis present

## 2024-02-03 DIAGNOSIS — Z8249 Family history of ischemic heart disease and other diseases of the circulatory system: Secondary | ICD-10-CM | POA: Diagnosis not present

## 2024-02-03 DIAGNOSIS — Z885 Allergy status to narcotic agent status: Secondary | ICD-10-CM | POA: Diagnosis not present

## 2024-02-03 DIAGNOSIS — C774 Secondary and unspecified malignant neoplasm of inguinal and lower limb lymph nodes: Secondary | ICD-10-CM | POA: Diagnosis present

## 2024-02-03 DIAGNOSIS — Z923 Personal history of irradiation: Secondary | ICD-10-CM | POA: Diagnosis not present

## 2024-02-03 DIAGNOSIS — C210 Malignant neoplasm of anus, unspecified: Secondary | ICD-10-CM | POA: Diagnosis not present

## 2024-02-03 DIAGNOSIS — D649 Anemia, unspecified: Secondary | ICD-10-CM | POA: Diagnosis present

## 2024-02-03 DIAGNOSIS — I214 Non-ST elevation (NSTEMI) myocardial infarction: Secondary | ICD-10-CM

## 2024-02-03 DIAGNOSIS — R0781 Pleurodynia: Secondary | ICD-10-CM | POA: Diagnosis not present

## 2024-02-03 DIAGNOSIS — D61810 Antineoplastic chemotherapy induced pancytopenia: Secondary | ICD-10-CM | POA: Diagnosis not present

## 2024-02-03 DIAGNOSIS — I700 Atherosclerosis of aorta: Secondary | ICD-10-CM | POA: Diagnosis present

## 2024-02-03 DIAGNOSIS — R739 Hyperglycemia, unspecified: Secondary | ICD-10-CM | POA: Diagnosis present

## 2024-02-03 DIAGNOSIS — I319 Disease of pericardium, unspecified: Secondary | ICD-10-CM | POA: Diagnosis not present

## 2024-02-03 DIAGNOSIS — Z79899 Other long term (current) drug therapy: Secondary | ICD-10-CM | POA: Diagnosis not present

## 2024-02-03 DIAGNOSIS — K5180 Other ulcerative colitis without complications: Secondary | ICD-10-CM | POA: Diagnosis present

## 2024-02-03 DIAGNOSIS — E7800 Pure hypercholesterolemia, unspecified: Secondary | ICD-10-CM | POA: Diagnosis present

## 2024-02-03 DIAGNOSIS — Z808 Family history of malignant neoplasm of other organs or systems: Secondary | ICD-10-CM | POA: Diagnosis not present

## 2024-02-03 DIAGNOSIS — I10 Essential (primary) hypertension: Secondary | ICD-10-CM | POA: Diagnosis present

## 2024-02-03 DIAGNOSIS — M79661 Pain in right lower leg: Secondary | ICD-10-CM | POA: Diagnosis not present

## 2024-02-03 DIAGNOSIS — Z8711 Personal history of peptic ulcer disease: Secondary | ICD-10-CM | POA: Diagnosis not present

## 2024-02-03 DIAGNOSIS — R569 Unspecified convulsions: Secondary | ICD-10-CM | POA: Diagnosis not present

## 2024-02-03 DIAGNOSIS — I2510 Atherosclerotic heart disease of native coronary artery without angina pectoris: Secondary | ICD-10-CM | POA: Diagnosis present

## 2024-02-03 DIAGNOSIS — C218 Malignant neoplasm of overlapping sites of rectum, anus and anal canal: Secondary | ICD-10-CM | POA: Diagnosis present

## 2024-02-03 DIAGNOSIS — R7303 Prediabetes: Secondary | ICD-10-CM | POA: Diagnosis present

## 2024-02-03 DIAGNOSIS — I309 Acute pericarditis, unspecified: Secondary | ICD-10-CM | POA: Diagnosis present

## 2024-02-03 HISTORY — PX: LEFT HEART CATH AND CORONARY ANGIOGRAPHY: CATH118249

## 2024-02-03 LAB — I-STAT ARTERIAL BLOOD GAS, ED
Acid-Base Excess: 0 mmol/L (ref 0.0–2.0)
Bicarbonate: 24.5 mmol/L (ref 20.0–28.0)
Calcium, Ion: 1.28 mmol/L (ref 1.15–1.40)
HCT: 30 % — ABNORMAL LOW (ref 36.0–46.0)
Hemoglobin: 10.2 g/dL — ABNORMAL LOW (ref 12.0–15.0)
O2 Saturation: 99 %
Patient temperature: 97.4
Potassium: 4.1 mmol/L (ref 3.5–5.1)
Sodium: 138 mmol/L (ref 135–145)
TCO2: 26 mmol/L (ref 22–32)
pCO2 arterial: 38.5 mmHg (ref 32–48)
pH, Arterial: 7.409 (ref 7.35–7.45)
pO2, Arterial: 115 mmHg — ABNORMAL HIGH (ref 83–108)

## 2024-02-03 LAB — BASIC METABOLIC PANEL WITH GFR
Anion gap: 11 (ref 5–15)
BUN: 30 mg/dL — ABNORMAL HIGH (ref 8–23)
CO2: 24 mmol/L (ref 22–32)
Calcium: 9.5 mg/dL (ref 8.9–10.3)
Chloride: 103 mmol/L (ref 98–111)
Creatinine, Ser: 1.4 mg/dL — ABNORMAL HIGH (ref 0.44–1.00)
GFR, Estimated: 41 mL/min — ABNORMAL LOW (ref >60)
Glucose, Bld: 147 mg/dL — ABNORMAL HIGH (ref 70–99)
Potassium: 4.1 mmol/L (ref 3.5–5.1)
Sodium: 138 mmol/L (ref 135–145)

## 2024-02-03 LAB — CBC
HCT: 31.9 % — ABNORMAL LOW (ref 36.0–46.0)
Hemoglobin: 10.6 g/dL — ABNORMAL LOW (ref 12.0–15.0)
MCH: 28.7 pg (ref 26.0–34.0)
MCHC: 33.2 g/dL (ref 30.0–36.0)
MCV: 86.4 fL (ref 80.0–100.0)
Platelets: 171 10*3/uL (ref 150–400)
RBC: 3.69 MIL/uL — ABNORMAL LOW (ref 3.87–5.11)
RDW: 13.5 % (ref 11.5–15.5)
WBC: 3.5 10*3/uL — ABNORMAL LOW (ref 4.0–10.5)
nRBC: 0 % (ref 0.0–0.2)

## 2024-02-03 LAB — ECHOCARDIOGRAM COMPLETE
AR max vel: 1.96 cm2
AV Peak grad: 7.3 mmHg
Ao pk vel: 1.35 m/s
Area-P 1/2: 3.05 cm2
Height: 65 in
MV VTI: 2.38 cm2
S' Lateral: 3.2 cm
Weight: 2752 [oz_av]

## 2024-02-03 LAB — LIPID PANEL
Cholesterol: 122 mg/dL (ref 0–200)
HDL: 67 mg/dL (ref >40)
LDL Cholesterol: 44 mg/dL (ref 0–99)
Total CHOL/HDL Ratio: 1.8 ratio
Triglycerides: 53 mg/dL (ref <150)
VLDL: 11 mg/dL (ref 0–40)

## 2024-02-03 LAB — GLUCOSE, CAPILLARY: Glucose-Capillary: 142 mg/dL — ABNORMAL HIGH (ref 70–99)

## 2024-02-03 LAB — TROPONIN I (HIGH SENSITIVITY)
Troponin I (High Sensitivity): 426 ng/L (ref <18)
Troponin I (High Sensitivity): 43 ng/L — ABNORMAL HIGH (ref <18)

## 2024-02-03 LAB — CBG MONITORING, ED: Glucose-Capillary: 144 mg/dL — ABNORMAL HIGH (ref 70–99)

## 2024-02-03 LAB — SEDIMENTATION RATE: Sed Rate: 20 mm/h (ref 0–22)

## 2024-02-03 LAB — C-REACTIVE PROTEIN: CRP: 0.6 mg/dL (ref <1.0)

## 2024-02-03 SURGERY — LEFT HEART CATH AND CORONARY ANGIOGRAPHY
Anesthesia: LOCAL

## 2024-02-03 MED ORDER — SODIUM CHLORIDE 0.9% FLUSH
10.0000 mL | Freq: Two times a day (BID) | INTRAVENOUS | Status: DC
  Administered 2024-02-03: 20 mL

## 2024-02-03 MED ORDER — VERAPAMIL HCL 2.5 MG/ML IV SOLN
INTRAVENOUS | Status: AC
  Filled 2024-02-03: qty 2

## 2024-02-03 MED ORDER — SODIUM CHLORIDE 0.9 % IV SOLN: 250.0000 mL | INTRAVENOUS | Status: AC | PRN

## 2024-02-03 MED ORDER — PERFLUTREN LIPID MICROSPHERE
1.0000 mL | INTRAVENOUS | Status: AC | PRN
Start: 2024-02-03 — End: 2024-02-03
  Administered 2024-02-03: 2 mL via INTRAVENOUS

## 2024-02-03 MED ORDER — HEPARIN (PORCINE) 25000 UT/250ML-% IV SOLN
850.0000 [IU]/h | INTRAVENOUS | Status: DC
  Administered 2024-02-03: 850 [IU]/h via INTRAVENOUS
  Filled 2024-02-03: qty 250

## 2024-02-03 MED ORDER — SODIUM CHLORIDE 0.9 % IV SOLN: INTRAVENOUS | Status: DC

## 2024-02-03 MED ORDER — SODIUM CHLORIDE 0.9% FLUSH: 3.0000 mL | INTRAVENOUS | Status: DC | PRN

## 2024-02-03 MED ORDER — PANTOPRAZOLE SODIUM 40 MG IV SOLR
40.0000 mg | Freq: Two times a day (BID) | INTRAVENOUS | Status: DC
  Administered 2024-02-03: 40 mg via INTRAVENOUS
  Filled 2024-02-03: qty 10

## 2024-02-03 MED ORDER — PANTOPRAZOLE SODIUM 40 MG IV SOLR
40.0000 mg | Freq: Every day | INTRAVENOUS | Status: DC
  Administered 2024-02-03: 40 mg via INTRAVENOUS
  Filled 2024-02-03: qty 10

## 2024-02-03 MED ORDER — KETOROLAC TROMETHAMINE 15 MG/ML IJ SOLN
15.0000 mg | Freq: Once | INTRAMUSCULAR | Status: AC
  Administered 2024-02-03: 15 mg via INTRAVENOUS
  Filled 2024-02-03: qty 1

## 2024-02-03 MED ORDER — VERAPAMIL HCL 2.5 MG/ML IV SOLN
INTRAVENOUS | Status: DC | PRN
  Administered 2024-02-03: 10 mL via INTRA_ARTERIAL

## 2024-02-03 MED ORDER — HYDROMORPHONE HCL 1 MG/ML IJ SOLN
0.5000 mg | INTRAMUSCULAR | Status: DC | PRN
  Administered 2024-02-03 – 2024-02-04 (×4): 0.5 mg via INTRAVENOUS
  Filled 2024-02-03 (×4): qty 1

## 2024-02-03 MED ORDER — SODIUM CHLORIDE 0.9 % WEIGHT BASED INFUSION
1.0000 mL/kg/h | INTRAVENOUS | Status: DC
Start: 2024-02-03 — End: 2024-02-03

## 2024-02-03 MED ORDER — ALUM & MAG HYDROXIDE-SIMETH 200-200-20 MG/5ML PO SUSP
30.0000 mL | Freq: Once | ORAL | Status: AC
  Administered 2024-02-03: 30 mL via ORAL
  Filled 2024-02-03: qty 30

## 2024-02-03 MED ORDER — CHLORHEXIDINE GLUCONATE CLOTH 2 % EX PADS
6.0000 | MEDICATED_PAD | Freq: Every day | CUTANEOUS | Status: DC
  Administered 2024-02-03 – 2024-02-04 (×2): 6 via TOPICAL

## 2024-02-03 MED ORDER — HYOSCYAMINE SULFATE 0.125 MG SL SUBL
0.2500 mg | SUBLINGUAL_TABLET | Freq: Once | SUBLINGUAL | Status: AC
  Administered 2024-02-03: 0.25 mg via SUBLINGUAL
  Filled 2024-02-03: qty 2

## 2024-02-03 MED ORDER — SODIUM CHLORIDE 0.9% FLUSH: 10.0000 mL | INTRAVENOUS | Status: DC | PRN

## 2024-02-03 MED ORDER — SODIUM CHLORIDE 0.9 % WEIGHT BASED INFUSION: 3.0000 mL/kg/h | INTRAVENOUS | Status: DC

## 2024-02-03 MED ORDER — HEPARIN BOLUS VIA INFUSION
4000.0000 [IU] | Freq: Once | INTRAVENOUS | Status: AC
  Administered 2024-02-03: 4000 [IU] via INTRAVENOUS
  Filled 2024-02-03: qty 4000

## 2024-02-03 MED ORDER — COLCHICINE 0.6 MG PO TABS
0.6000 mg | ORAL_TABLET | Freq: Two times a day (BID) | ORAL | Status: DC
  Administered 2024-02-03 – 2024-02-04 (×2): 0.6 mg via ORAL
  Filled 2024-02-03 (×2): qty 1

## 2024-02-03 MED ORDER — LIDOCAINE HCL (PF) 1 % IJ SOLN
INTRAMUSCULAR | Status: DC | PRN
  Administered 2024-02-03: 2 mL via INTRADERMAL

## 2024-02-03 MED ORDER — HEPARIN SODIUM (PORCINE) 1000 UNIT/ML IJ SOLN
INTRAMUSCULAR | Status: DC | PRN
  Administered 2024-02-03: 4000 [IU] via INTRAVENOUS

## 2024-02-03 MED ORDER — PROCHLORPERAZINE EDISYLATE 10 MG/2ML IJ SOLN
10.0000 mg | Freq: Four times a day (QID) | INTRAMUSCULAR | Status: DC | PRN
  Administered 2024-02-03: 10 mg via INTRAVENOUS
  Filled 2024-02-03: qty 2

## 2024-02-03 MED ORDER — INDOMETHACIN 25 MG PO CAPS
25.0000 mg | ORAL_CAPSULE | Freq: Three times a day (TID) | ORAL | Status: DC
  Administered 2024-02-03 – 2024-02-04 (×3): 25 mg via ORAL
  Filled 2024-02-03 (×4): qty 1

## 2024-02-03 MED ORDER — IOHEXOL 350 MG/ML SOLN
INTRAVENOUS | Status: DC | PRN
  Administered 2024-02-03: 40 mL

## 2024-02-03 MED ORDER — COLCHICINE 0.6 MG PO TABS
0.6000 mg | ORAL_TABLET | Freq: Two times a day (BID) | ORAL | Status: DC
  Administered 2024-02-03: 0.6 mg via ORAL
  Filled 2024-02-03: qty 1

## 2024-02-03 MED ORDER — SODIUM CHLORIDE 0.9% FLUSH: 3.0000 mL | Freq: Two times a day (BID) | INTRAVENOUS | Status: DC

## 2024-02-03 MED ORDER — CYCLOBENZAPRINE HCL 10 MG PO TABS
5.0000 mg | ORAL_TABLET | Freq: Three times a day (TID) | ORAL | Status: DC | PRN
  Filled 2024-02-03: qty 1

## 2024-02-03 MED ORDER — SODIUM CHLORIDE 0.9 % IV SOLN
INTRAVENOUS | Status: AC | PRN
  Administered 2024-02-03: 10 mL/h via INTRAVENOUS

## 2024-02-03 MED ORDER — ENOXAPARIN SODIUM 40 MG/0.4ML IJ SOSY
40.0000 mg | PREFILLED_SYRINGE | INTRAMUSCULAR | Status: DC
  Administered 2024-02-04: 40 mg via SUBCUTANEOUS
  Filled 2024-02-03: qty 0.4

## 2024-02-03 MED ORDER — ASPIRIN 81 MG PO CHEW: 81.0000 mg | CHEWABLE_TABLET | ORAL | Status: DC

## 2024-02-03 MED ORDER — HEPARIN SODIUM (PORCINE) 1000 UNIT/ML IJ SOLN
INTRAMUSCULAR | Status: AC
  Filled 2024-02-03: qty 10

## 2024-02-03 MED ORDER — HEPARIN (PORCINE) IN NACL 2000-0.9 UNIT/L-% IV SOLN
INTRAVENOUS | Status: DC | PRN
  Administered 2024-02-03: 1000 mL

## 2024-02-03 MED ORDER — INDOMETHACIN 25 MG PO CAPS
25.0000 mg | ORAL_CAPSULE | Freq: Three times a day (TID) | ORAL | Status: DC
  Filled 2024-02-03 (×2): qty 1

## 2024-02-03 MED ORDER — ENOXAPARIN SODIUM 40 MG/0.4ML IJ SOSY
40.0000 mg | PREFILLED_SYRINGE | INTRAMUSCULAR | Status: DC
Start: 2024-02-03 — End: 2024-02-03

## 2024-02-03 MED ORDER — IVABRADINE HCL 5 MG PO TABS
10.0000 mg | ORAL_TABLET | Freq: Once | ORAL | Status: AC
  Administered 2024-02-03: 10 mg via ORAL
  Filled 2024-02-03: qty 2

## 2024-02-03 MED ORDER — INDOMETHACIN 25 MG PO CAPS: 25.0000 mg | ORAL_CAPSULE | Freq: Two times a day (BID) | ORAL | Status: DC

## 2024-02-03 MED ORDER — ASPIRIN 81 MG PO CHEW
81.0000 mg | CHEWABLE_TABLET | ORAL | Status: AC
  Administered 2024-02-03: 81 mg via ORAL
  Filled 2024-02-03: qty 1

## 2024-02-03 SURGICAL SUPPLY — 8 items
CATH INFINITI AMBI 5FR JK (CATHETERS) IMPLANT
CATH INFINITI JR4 5F (CATHETERS) IMPLANT
DEVICE RAD COMP TR BAND LRG (VASCULAR PRODUCTS) IMPLANT
GLIDESHEATH SLEND SS 6F .021 (SHEATH) IMPLANT
GUIDEWIRE INQWIRE 1.5J.035X260 (WIRE) IMPLANT
PACK CARDIAC CATHETERIZATION (CUSTOM PROCEDURE TRAY) ×1 IMPLANT
SET ATX-X65L (MISCELLANEOUS) IMPLANT
SHEATH PROBE COVER 6X72 (BAG) IMPLANT

## 2024-02-03 NOTE — H&P (View-Only) (Signed)
 Attending Addendum  History and all data above reviewed.  Patient examined.  I agree with the findings as above.  The patient exam reveals:  VS:  BP 113/60   Pulse 75   Temp 97.8 F (36.6 C)   Resp (!) 26   Ht 5\' 5"  (1.651 m)   Wt 78 kg   SpO2 100%   BMI 28.62 kg/m  , BMI Body mass index is 28.62 kg/m. GENERAL: Ill-appearing.  Nauseous.  HEENT: Pupils equal round and reactive, fundi not visualized, oral mucosa unremarkable NECK:  No jugular venous distention, waveform within normal limits, carotid upstroke brisk and symmetric, no bruits, no thyromegaly LUNGS:  Clear to auscultation bilaterally HEART:  RRR.  PMI not displaced or sustained,S1 and S2 within normal limits, no S3, no S4, no clicks, no rubs, no murmurs ABD:  Flat, positive bowel sounds normal in frequency in pitch, no bruits, no rebound, no guarding, no midline pulsatile mass, no hepatomegaly, no splenomegaly EXT:  2 plus pulses throughout, no edema, no cyanosis no clubbing SKIN:  No rashes no nodules NEURO:  Cranial nerves II through XII grossly intact, motor grossly intact throughout PSYCH:  Cognitively intact, oriented to person place and time   All available labs, radiology testing, previous records reviewed. Agree with documented assessment and plan. Andrea [name] is a 15F with non-obstructive CAD, hypertension, hyperlipidemia, ulcerative colitis, and recently diagnosed with anal cancer admitted with chest pain.    # Chest pain:  # Elevated troponin: Andrea [name] has sharp chest pain on lying down that is alleviated when sitting up. High-sensitivity troponin was mildly elevated initially 32-->27-->33-->43.  This AM increased to 426 and she now has mild CP while sitting.  She also reports sharp CP radiating to her shoulder when breathing this AM.  Now resolved.  She also has nausea.  Pleuritic symptoms and recent 5 FU exposure concerning for pericarditis.  However, with normal ESR/CRP, her new nausea, rising troponin and  transient ST elevation, will plan for LHC this am.  Also ordering stat echo given that she didn't get it yesterday.  If no pericardial effusion will start IV heparin .  Indomethacin was discontinued due to history of ulcerative colitis.  Cochicine if cath is negative. LHC in 2023 revealed minimal non-obstructive disease.   Informed Consent   Shared Decision Making/Informed Consent The risks [stroke (1 in 1000), death (1 in 1000), kidney failure [usually temporary] (1 in 500), bleeding (1 in 200), allergic reaction [possibly serious] (1 in 200)], benefits (diagnostic support and management of coronary artery disease) and alternatives of a cardiac catheterization were discussed in detail with Andrea [name] and she is willing to proceed.     # Hypertension:  BP low this am.  Holding home BP meds.   #  Atherosclerosis: # Coronary calcification: # Hyperlipidemia: Continue atorvastatin  and Zetia .   Andrea [place]gate C. Theodis Fiscal, MD, [facility]  02/03/2024 11:34 AM

## 2024-02-03 NOTE — Care Management Obs Status (Signed)
 MEDICARE OBSERVATION STATUS NOTIFICATION   Patient Details  Name: DOMINEQUE BURGI MRN: [id number] Date of Birth: Jul 07, 1956   Medicare Observation Status Notification Given:  Yes    Joanette Moynahan, RN 02/03/2024, 8:33 AM

## 2024-02-03 NOTE — Interval H&P Note (Signed)
 History and Physical Interval Note:  02/03/2024 1:37 PM  Andrea [name]  has presented today for surgery, with the diagnosis of nstemi.  The various methods of treatment have been discussed with the patient and family. After consideration of risks, benefits and other options for treatment, the patient has consented to  Procedure(s): LEFT HEART CATH AND CORONARY ANGIOGRAPHY (N/A) as a surgical intervention.  The patient's history has been reviewed, patient examined, no change in status, stable for surgery.  I have reviewed the patient's chart and labs.  Questions were answered to the patient's satisfaction.     Harrold Fitchett

## 2024-02-03 NOTE — Progress Notes (Signed)
 PHARMACY - ANTICOAGULATION CONSULT NOTE  Pharmacy Consult for heparin  Indication: STEMI/ACS  Allergies  Allergen Reactions   Codeine Nausea And Vomiting    Patient Measurements: Height: 5\' 5"  (165.1 cm) Weight: 78 kg (172 lb) IBW/kg (Calculated) : 57 HEPARIN  DW (KG): 73.3  Vital Signs: Temp: 97.8 F (36.6 C) (05/15 1000) Temp Source: Oral (05/15 0518) BP: 113/60 (05/15 1111) Pulse Rate: 75 (05/15 1111)  Labs: Recent Labs    01/31/24 1352 01/31/24 1352 02/02/24 1103 02/02/24 1322 02/02/24 1956 02/02/24 2013 02/02/24 2335 02/03/24 0904 02/03/24 1003  HGB 10.9*   < > 11.5*  --   --  10.9*  --  10.6* 10.2*  HCT 31.6*  --  34.8*  --   --  32.0*  --  31.9* 30.0*  PLT 230  --  198  --   --   --   --  171  --   CREATININE 1.05*  --  1.23*  --   --  1.30*  --  1.40*  --   TROPONINIHS  --   --  32*   < > 33*  --  43* 426*  --    < > = values in this interval not displayed.    Estimated Creatinine Clearance: 39.2 mL/min (A) (by C-G formula based on SCr of 1.4 mg/dL (H)).   Medical History: Past Medical History:  Diagnosis Date   Anemia    Anginal pain (HCC)    Arthritis    Chest discomfort    Disorder of immune system (HCC)    Dyspnea    Dyspnea on exertion    Hypercholesterolemia    Hypertension    Migraine    Non-ulcer dyspepsia    Osteopenia of spine    Peptic ulcer disease    Pneumonia    Polyp of rectum    cancerous   Pre-diabetes    Skin cancer    Ulcerative colitis (HCC)     Medications:  Scheduled:   atorvastatin   40 mg Oral Daily   enoxaparin  (LOVENOX ) injection  40 mg Subcutaneous Q24H   ezetimibe   10 mg Oral Daily   pantoprazole (PROTONIX) IV  40 mg Intravenous QHS   PARoxetine  10 mg Oral Daily   Infusions:  PRN: acetaminophen , HYDROmorphone  (DILAUDID ) injection, naLOXone (NARCAN)  injection, nitroGLYCERIN , ondansetron  (ZOFRAN ) IV, perflutren lipid microspheres (DEFINITY) IV suspension, prochlorperazine   Assessment: 68 yo F with chest  pain and elevated troponin. No anticoagulation PTA. Pharmacy consulted to start heparin .   Pt received a dose of enoxaparin  40 mg at 1510 yesterday. CBC okay - hgb 10.6 and plts 171.   Goal of Therapy:  Heparin  level 0.3-0.7 units/ml Monitor platelets by anticoagulation protocol: Yes   Plan:  Give 4000 units bolus x 1 Start heparin  infusion at 850 units/hr Check anti-Xa level in 8 hours and daily while on heparin  Continue to monitor H&H and platelets  Adaline Ada, PharmD PGY1 Pharmacy Resident 02/03/2024 11:45 AM

## 2024-02-03 NOTE — Progress Notes (Addendum)
 Progress Note  Patient Name: Andrea [name] Date of Encounter: 02/03/2024  Primary Cardiologist: Gaylyn Keas, MD   Subjective   Patient evaluated bedside, reports that she is continues to have constant epigastric pain, with intermittent radiation to right shoulder, pain that is worse when laying flat.  Patient also reports episodes of nausea and vomiting, feeling overall unwell.   Inpatient Medications    Scheduled Meds:  atorvastatin   40 mg Oral Daily   enoxaparin  (LOVENOX ) injection  40 mg Subcutaneous Q24H   ezetimibe   10 mg Oral Daily   pantoprazole (PROTONIX) IV  40 mg Intravenous QHS   PARoxetine  10 mg Oral Daily   Continuous Infusions: None   PRN Meds: acetaminophen , HYDROmorphone  (DILAUDID ) injection, naLOXone (NARCAN)  injection, nitroGLYCERIN , ondansetron  (ZOFRAN ) IV, perflutren lipid microspheres (DEFINITY) IV suspension, prochlorperazine    Vital Signs    Vitals:   02/03/24 0915 02/03/24 0925 02/03/24 1000 02/03/24 1111  BP:   110/63 113/60  Pulse: 74 (!) 132 71 75  Resp: (!) 22 (!) 27 20 (!) 26  Temp:   97.8 F (36.6 C)   TempSrc:      SpO2: 100% 100% 100% 100%  Weight:      Height:       No intake or output data in the 24 hours ending 02/03/24 1152 Filed Weights   02/02/24 1040  Weight: 78 kg    ECG    Diffuse concave ST elevation, ventricular rate of 73 bpm- Personally Reviewed  Physical Exam   GEN: Appears chronically ill, laying in bed, no acute distress Cardiac: Regular rate, no murmurs appreciated Respiratory: Breathing comfortably, no wheezing or crackles noted GI: + Tenderness to palpation in the epigastric region MS: No edema; No deformity. Neuro:  Nonfocal  Psych: Normal affect   Labs    Chemistry Recent Labs  Lab 01/31/24 1352 02/02/24 1103 02/02/24 2013 02/03/24 0904 02/03/24 1003  NA 137 137 137 138 138  K 4.3 3.9 3.5 4.1 4.1  CL 106 105 103 103  --   CO2 26 21*  --  24  --   GLUCOSE 99 140* 153* 147*  --   BUN  23 26* 26* 30*  --   CREATININE 1.05* 1.23* 1.30* 1.40*  --   CALCIUM  9.5 10.0  --  9.5  --   PROT 7.1 7.0  --   --   --   ALBUMIN 4.1 3.8  --   --   --   AST 21 26  --   --   --   ALT 15 19  --   --   --   ALKPHOS 87 46  --   --   --   BILITOT 0.4 0.8  --   --   --   GFRNONAA 58* 48*  --  41*  --   ANIONGAP 5 11  --  11  --      Hematology Recent Labs  Lab 01/31/24 1352 01/31/24 1352 02/02/24 1103 02/02/24 2013 02/03/24 0904 02/03/24 1003  WBC 6.9  --  5.1  --  3.5*  --   RBC 3.76*  --  3.98  --  3.69*  --   HGB 10.9*   < > 11.5* 10.9* 10.6* 10.2*  HCT 31.6*  --  34.8* 32.0* 31.9* 30.0*  MCV 84.0  --  87.4  --  86.4  --   MCH 29.0  --  28.9  --  28.7  --  MCHC 34.5  --  33.0  --  33.2  --   RDW 13.2  --  13.2  --  13.5  --   PLT 230  --  198  --  171  --    < > = values in this interval not displayed.    Cardiac EnzymesNo results for input(s): "TROPONINI" in the last 168 hours. No results for input(s): "TROPIPOC" in the last 168 hours.   BNP Recent Labs  Lab 02/02/24 1103  BNP 44.3     DDimer No results for input(s): "DDIMER" in the last 168 hours.   Radiology    DG Chest Port 1 View Result Date: 02/02/2024 CLINICAL DATA:  Status post PICC line placement. EXAM: PORTABLE CHEST 1 VIEW COMPARISON:  June 20, 2023 FINDINGS: A right-sided PICC line is seen with its distal tip approximately 1.8 cm distal to the junction of the superior vena cava and right atrium. The heart size and mediastinal contours are within normal limits. There is moderate severity calcification of the aortic arch. Stable mild to moderate severity biapical scarring and/or atelectasis is seen. No pleural effusion or pneumothorax is identified. The visualized skeletal structures are unremarkable. IMPRESSION: Right-sided PICC line positioning, as described above. Electronically Signed   By: Virgle Grime M.D.   On: 02/02/2024 23:24   CT Angio Chest PE W and/or Wo Contrast Result Date:  02/02/2024 CLINICAL DATA:  Chest pain. EXAM: CT ANGIOGRAPHY CHEST WITH CONTRAST TECHNIQUE: Multidetector CT imaging of the chest was performed using the standard protocol during bolus administration of intravenous contrast. Multiplanar CT image reconstructions and MIPs were obtained to evaluate the vascular anatomy. RADIATION DOSE REDUCTION: This exam was performed according to the departmental dose-optimization program which includes automated exposure control, adjustment of the mA and/or kV according to patient size and/or use of iterative reconstruction technique. CONTRAST:  65mL OMNIPAQUE  IOHEXOL  350 MG/ML SOLN COMPARISON:  Jan 25, 2024. FINDINGS: Cardiovascular: Satisfactory opacification of the pulmonary arteries to the segmental level. No evidence of pulmonary embolism. Normal heart size. No pericardial effusion. Mediastinum/Nodes: No enlarged mediastinal, hilar, or axillary lymph nodes. Thyroid gland, trachea, and esophagus demonstrate no significant findings. Lungs/Pleura: No pneumothorax or pleural effusion is noted. Mild biapical scarring is noted. Upper Abdomen: No acute abnormality. Musculoskeletal: No chest wall abnormality. No acute or significant osseous findings. Review of the MIP images confirms the above findings. IMPRESSION: No definite evidence of pulmonary embolus. Electronically Signed   By: Rosalene Colon M.D.   On: 02/02/2024 13:14    Cardiac Studies   Prior to admissions:  01/21/2022: LHC and coronary angiogram  Prox RCA lesion is 20% stenosed.   Prox LAD lesion is 20% stenosed.   The left ventricular systolic function is normal.   LV end diastolic pressure is normal.   The left ventricular ejection fraction is 55-65% by visual estimate.   Mild non-obstructive CAD Normal LV systolic function  08/28/2019: Myocardial perfusion imaging Nuclear stress EF: 64%. There was no ST segment deviation noted during stress. The study is normal. This is a low risk study. The left  ventricular ejection fraction is normal (55-65%).  08/19/2019: CT Coronary Angiogam  IMPRESSION: 1. No active cardiopulmonary abnormalities. 2. Mild cardiac enlargement. 3. 3 mm left lower lobe perifissural nodule is identified. Nonspecific. No follow-up needed if patient is low-risk. Non-contrast chest CT can be considered in 12 months if patient is high-risk. This recommendation follows the consensus statement: Guidelines for Management of Incidental Pulmonary Nodules Detected on CT  Images: From the Fleischner Society 2017; Radiology 2017; [phone].    Patient Profile     Andrea [name] is a 68 y.o. female with a hx of CAD, HTN, HLD, ulcerative colitis, PUD, anal cancer currently on chemotherapy who is being seen 02/02/2024 for the evaluation of chest pain at the request of Rosealee Concha MD.   Assessment & Plan    Chest pain Elevated Troponin Continues to endorse constant left-sided, epigastric chest pain that is worse with laying flat and alleviated with sitting up. Reports that she had intermittent episode last night with pain radiating to her right shoulder.  Unchanged with respiration.  High sensitivity troponin 32-> 27-> 33-> 43-> 426 (this morning).  She was started on chemoradiation medication with 5 FU, first dose given on Monday 5/12. EKG this morning with diffuse concave ST elevation, consistent with drug-induced acute pericarditis, however will need to rule out obstructive disease with increasing troponin. Patient had a left heart catheterization and coronary angiogram in 5/23 with 20% stenosis of the proximal RCA and proximal LAD, medically nonobstructive CAD. - ESR 20, CRP 0.6 -Continue nitroglycerin  sublingual 3.5 mg every 5 minutes as needed for chest pain - Will proceed with left heart catheterization to assess for coronary vessel disease - Stat echo today to assess for any effusions prior to starting IV heparin  - Because of underlying ulcerative colitis, colchicine  and NSAIDs are avoided in this population - If the underlying cause of her chest pain is coronary obstruction then we will treat accordingly however if no true obstruction is present, then we will treat for acute pericarditis, though treatment with colchicine and NSAIDs are limited in the setting of ulcerative colitis, will need GI consult.  Anal cancer The patient had a superficial squamous cell carcinoma resected on 04/2022. She had rectal discomfort and on 12/2023 had a biopsy performed on a 2-3cm mass that confirmed invasive SCC. PET scan showed Stage III a rectal cancer. She started chemoradiation with 5-FU and mitomycin on 01/31/24. Had PICC placed on 01/31/24   HTN Blood pressures were low this morning, at bedside SBP 110s.  Home medications consisted of losartan  25mg  daily and Maxide 37.5-25 mg daily.  - Monitor blood pressures - Hold home blood pressure medications  Atherosclerosis: Coronary calcification: Hyperlipidemia: Continue atorvastatin  and Zetia .   AKI -- management per primary    For questions or updates, please contact [facility] Please consult [email or web address] for contact info under Cardiology/STEMI.      Signed, Lanney Pitts, DO  DO, PGY 1 02/03/2024, 11:52 AM

## 2024-02-03 NOTE — ED Notes (Signed)
 Chemo pump shut off per NP from [facility] from the cancer center.

## 2024-02-03 NOTE — Progress Notes (Signed)
 Discontinue 5FU pump for chest pain admission and cardiac work up.  Levar Fayson, NP

## 2024-02-03 NOTE — Progress Notes (Signed)
 Attending Addendum  History and all data above reviewed.  Patient examined.  I agree with the findings as above.  The patient exam reveals:  VS:  BP 113/60   Pulse 75   Temp 97.8 F (36.6 C)   Resp (!) 26   Ht 5\' 5"  (1.651 m)   Wt 78 kg   SpO2 100%   BMI 28.62 kg/m  , BMI Body mass index is 28.62 kg/m. GENERAL: Ill-appearing.  Nauseous.  HEENT: Pupils equal round and reactive, fundi not visualized, oral mucosa unremarkable NECK:  No jugular venous distention, waveform within normal limits, carotid upstroke brisk and symmetric, no bruits, no thyromegaly LUNGS:  Clear to auscultation bilaterally HEART:  RRR.  PMI not displaced or sustained,S1 and S2 within normal limits, no S3, no S4, no clicks, no rubs, no murmurs ABD:  Flat, positive bowel sounds normal in frequency in pitch, no bruits, no rebound, no guarding, no midline pulsatile mass, no hepatomegaly, no splenomegaly EXT:  2 plus pulses throughout, no edema, no cyanosis no clubbing SKIN:  No rashes no nodules NEURO:  Cranial nerves II through XII grossly intact, motor grossly intact throughout PSYCH:  Cognitively intact, oriented to person place and time   All available labs, radiology testing, previous records reviewed. Agree with documented assessment and plan. Ms. Dusseault is a 15F with non-obstructive CAD, hypertension, hyperlipidemia, ulcerative colitis, and recently diagnosed with anal cancer admitted with chest pain.    # Chest pain:  # Elevated troponin: Ms. Cofrancesco has sharp chest pain on lying down that is alleviated when sitting up. High-sensitivity troponin was mildly elevated initially 32-->27-->33-->43.  This AM increased to 426 and she now has mild CP while sitting.  She also reports sharp CP radiating to her shoulder when breathing this AM.  Now resolved.  She also has nausea.  Pleuritic symptoms and recent 5 FU exposure concerning for pericarditis.  However, with normal ESR/CRP, her new nausea, rising troponin and  transient ST elevation, will plan for LHC this am.  Also ordering stat echo given that she didn't get it yesterday.  If no pericardial effusion will start IV heparin .  Indomethacin was discontinued due to history of ulcerative colitis.  Cochicine if cath is negative. LHC in 2023 revealed minimal non-obstructive disease.   Informed Consent   Shared Decision Making/Informed Consent The risks [stroke (1 in 1000), death (1 in 1000), kidney failure [usually temporary] (1 in 500), bleeding (1 in 200), allergic reaction [possibly serious] (1 in 200)], benefits (diagnostic support and management of coronary artery disease) and alternatives of a cardiac catheterization were discussed in detail with Ms. Zamor and she is willing to proceed.     # Hypertension:  BP low this am.  Holding home BP meds.   #  Atherosclerosis: # Coronary calcification: # Hyperlipidemia: Continue atorvastatin  and Zetia .   Seeley Southgate C. Theodis Fiscal, MD, [facility]  02/03/2024 11:34 AM

## 2024-02-03 NOTE — Progress Notes (Signed)
 Transition of Care [facility]) - Inpatient Brief Assessment   Patient Details  Name: Andrea [name] MRN: [id number] Date of Birth: March 04, 1956  Transition of Care [facility]) CM/SW Contact:    Joanette Moynahan, RN Phone Number: 02/03/2024, 8:27 AM   Clinical Narrative:   Transition of Care Department [facility]) has reviewed patient and no TOC needs have been identified at this time. We will continue to monitor patient advancement through interdisciplinary progression rounds. If new patient transition needs arise, please place a TOC consult.  Pt plans to discharge home with son Arlyce Lambert) and daughter-in-law.     Transition of Care Asessment: Insurance and Status: (P) Insurance coverage has been reviewed Patient has primary care physician: (P) Yes Home environment has been reviewed: (P) home alone Prior level of function:: (P) independent; very supportive children Prior/Current Home Services: (P) No current home services Social Drivers of Health Review: (P) SDOH reviewed no interventions necessary Readmission risk has been reviewed: (P) Yes Transition of care needs: (P) no transition of care needs at this time

## 2024-02-03 NOTE — Progress Notes (Signed)
 Pt arrived to 6E15 from the ED. See assessment.

## 2024-02-03 NOTE — ED Notes (Signed)
 Provider at beside, notified of rate and rhythm change.

## 2024-02-03 NOTE — Progress Notes (Addendum)
 Andrea [name]   DOB:1955-10-16   ZO#:[id number]      ASSESSMENT & PLAN:  Andrea [name] is a 68 year old female patient with a diagnosis of anal cancer on concurrent chemoradiation.  She presented to the ED on 02/02/2024 with complaints of chest pain.  Anal cancer cT2N1M0, stage IIIA  - She had superficial squamous cell rectal carcinoma, status post resection August 2023. - Biopsy done 01/20/2024 confirmed invasive squamous cell carcinoma of anus. - PET scan done 01/25/2024 showed some hypermetabolic activity right inguinal and external lymph node mets however no distant mets. - Initiated concurrent chemo radiation with 5-FU and mitomycin on 01/31/2024. - Medical oncology/Dr. Maryalice Smaller following.  Dr. Doyl Bitting will provide oncology coverage for the next few days.  Chest/epigastric pain Elevated troponin - Patient complains of sharp chest pain radiating to right shoulder whenever she lays down and is relieved by sitting up. - Initial troponin mildly elevated with sharp increase noted today 426.  Concerning for recent 5-FU infusion. - Status post left heart cath today.  Cardiology managing closely  Anemia, normocytic - Hemoglobin 10.6, baseline in the 11 range - No transfusional intervention required at this time - Continue to monitor CBC with differential  Hypertension/hypotension Hyperlipidemia - Monitor blood pressure - Continue statins as ordered    Code Status Full  Subjective:  Patient seen somewhat lethargic, states she feels "woozy".  Patient just returned from left heart cath.  Daughter-in-law at the bedside who states patient began to experience sharp chest area pain while 5-FU was infusing.  Patient was on her way to be taken for radiation be yesterday morning when she began to feel "chest pain and tightness".  At this time patient is resting comfortably.  No other acute distress is noted.  Objective:  No intake or output data in the 24 hours ending 02/03/24 1215   PHYSICAL  EXAMINATION: ECOG PERFORMANCE STATUS: 3 - Symptomatic, >50% confined to bed  Vitals:   02/03/24 1000 02/03/24 1111  BP: 110/63 113/60  Pulse: 71 75  Resp: 20 (!) 26  Temp: 97.8 F (36.6 C)   SpO2: 100% 100%   Filed Weights   02/02/24 1040  Weight: 172 lb (78 kg)    GENERAL: +Lethargic status post cardiac cath   +ill-appearing SKIN: skin color, texture, turgor are normal, no rashes or significant lesions EYES: normal, conjunctiva are pink and non-injected, sclera clear OROPHARYNX: no exudate, no erythema and lips, buccal mucosa, and tongue normal  NECK: supple, thyroid normal size, non-tender, without nodularity LYMPH: no palpable lymphadenopathy in the cervical, axillary or inguinal LUNGS: clear to auscultation and percussion with normal breathing effort HEART: regular rate & rhythm and no murmurs and no lower extremity edema ABDOMEN: abdomen soft, non-tender and normal bowel sounds MUSCULOSKELETAL: no cyanosis of digits and no clubbing  PSYCH: alert & oriented x 3 with fluent speech   All questions were answered. The patient knows to call the clinic with any problems, questions or concerns.   The total time spent in the appointment was 40 minutes encounter with patient including review of chart and various tests results, discussions about plan of care and coordination of care plan  Jacqualin Mate, NP 02/03/2024 12:15 PM    Labs Reviewed:  Lab Results  Component Value Date   WBC 3.5 (L) 02/03/2024   HGB 10.2 (L) 02/03/2024   HCT 30.0 (L) 02/03/2024   MCV 86.4 02/03/2024   PLT 171 02/03/2024   Recent Labs    01/20/24 1450 01/31/24 1352  02/02/24 1103 02/02/24 2013 02/03/24 0904 02/03/24 1003  NA 140 137 137 137 138 138  K 4.2 4.3 3.9 3.5 4.1 4.1  CL 108 106 105 103 103  --   CO2 27 26 21*  --  24  --   GLUCOSE 93 99 140* 153* 147*  --   BUN 23 23 26* 26* 30*  --   CREATININE 1.05* 1.05* 1.23* 1.30* 1.40*  --   CALCIUM  10.0 9.5 10.0  --  9.5  --   GFRNONAA  58* 58* 48*  --  41*  --   PROT 7.4 7.1 7.0  --   --   --   ALBUMIN 4.3 4.1 3.8  --   --   --   AST 22 21 26   --   --   --   ALT 19 15 19   --   --   --   ALKPHOS 61 87 46  --   --   --   BILITOT 0.6 0.4 0.8  --   --   --     Studies Reviewed:  DG Chest Port 1 View Result Date: 02/02/2024 CLINICAL DATA:  Status post PICC line placement. EXAM: PORTABLE CHEST 1 VIEW COMPARISON:  June 20, 2023 FINDINGS: A right-sided PICC line is seen with its distal tip approximately 1.8 cm distal to the junction of the superior vena cava and right atrium. The heart size and mediastinal contours are within normal limits. There is moderate severity calcification of the aortic arch. Stable mild to moderate severity biapical scarring and/or atelectasis is seen. No pleural effusion or pneumothorax is identified. The visualized skeletal structures are unremarkable. IMPRESSION: Right-sided PICC line positioning, as described above. Electronically Signed   By: Virgle Grime M.D.   On: 02/02/2024 23:24   CT Angio Chest PE W and/or Wo Contrast Result Date: 02/02/2024 CLINICAL DATA:  Chest pain. EXAM: CT ANGIOGRAPHY CHEST WITH CONTRAST TECHNIQUE: Multidetector CT imaging of the chest was performed using the standard protocol during bolus administration of intravenous contrast. Multiplanar CT image reconstructions and MIPs were obtained to evaluate the vascular anatomy. RADIATION DOSE REDUCTION: This exam was performed according to the departmental dose-optimization program which includes automated exposure control, adjustment of the mA and/or kV according to patient size and/or use of iterative reconstruction technique. CONTRAST:  65mL OMNIPAQUE  IOHEXOL  350 MG/ML SOLN COMPARISON:  Jan 25, 2024. FINDINGS: Cardiovascular: Satisfactory opacification of the pulmonary arteries to the segmental level. No evidence of pulmonary embolism. Normal heart size. No pericardial effusion. Mediastinum/Nodes: No enlarged mediastinal, hilar,  or axillary lymph nodes. Thyroid gland, trachea, and esophagus demonstrate no significant findings. Lungs/Pleura: No pneumothorax or pleural effusion is noted. Mild biapical scarring is noted. Upper Abdomen: No acute abnormality. Musculoskeletal: No chest wall abnormality. No acute or significant osseous findings. Review of the MIP images confirms the above findings. IMPRESSION: No definite evidence of pulmonary embolus. Electronically Signed   By: Rosalene Colon M.D.   On: 02/02/2024 13:14   IR PICC PLACEMENT RIGHT <5 YRS INC IMG GUIDE Result Date: 01/31/2024 INDICATION: Patient with history of anal cancer; central venous access requested for chemotherapy EXAM: RIGHT UPPER EXTREMITY PICC LINE PLACEMENT WITH ULTRASOUND AND FLUOROSCOPIC GUIDANCE MEDICATIONS: 4 mL 1% lidocaine  to skin and subcutaneous tissue ANESTHESIA/SEDATION: None FLUOROSCOPY: Radiation Exposure Index (as provided by the fluoroscopic device): 1 mGy Kerma COMPLICATIONS: None immediate. PROCEDURE: The patient was advised of the possible risks and complications and agreed to undergo the procedure. The patient was  then brought to the angiographic suite for the procedure. The right arm was prepped with chlorhexidine , draped in the usual sterile fashion using maximum barrier technique (cap and mask, sterile gown, sterile gloves, large sterile sheet, hand hygiene and cutaneous antisepsis) and infiltrated locally with 1% Lidocaine . Ultrasound demonstrated patency of the right basilic vein, and this was documented with an image. Under real-time ultrasound guidance, this vein was accessed with a 21 gauge micropuncture needle and image documentation was performed. A 0.018 wire was introduced in to the vein. Over this, a 5 [place] dual lumen power injectable PICC was advanced to the lower SVC/right atrial junction. Fluoroscopy during the procedure and fluoro spot radiograph confirms appropriate catheter position. The catheter was flushed and covered with a  sterile dressing. Catheter length: 40 cm IMPRESSION: Successful RIGHT arm power PICC line placement with ultrasound and fluoroscopic guidance. The tip of the catheter is positioned at the superior cavo-atrial junction. the catheter is ready for use. Performed by: Wash Hack Electronically Signed   By: Art Largo M.D.   On: 01/31/2024 11:53   NM PET Image Initial (PI) Skull Base To Thigh Result Date: 01/25/2024 CLINICAL DATA:  Initial treatment strategy for anal carcinoma. History of ulcerative colitis. EXAM: NUCLEAR MEDICINE PET SKULL BASE TO THIGH TECHNIQUE: 8.6 mCi F-18 FDG was injected intravenously. Full-ring PET imaging was performed from the skull base to thigh after the radiotracer. CT data was obtained and used for attenuation correction and anatomic localization. Fasting blood glucose: 89 mg/dl COMPARISON:  Limited correlation made with cardiac CT 08/09/2019. FINDINGS: Mediastinal blood pool activity: SUV max 3.1 NECK: No hypermetabolic cervical lymph nodes are identified.Fairly symmetric activity within the lymphoid tissue of Waldeyer's ring is within physiologic limits. No suspicious activity identified within the pharyngeal mucosal space. Incidental CT findings: Mild left carotid atherosclerosis. CHEST: There are no hypermetabolic mediastinal, hilar or axillary lymph nodes. No hypermetabolic pulmonary activity or suspicious nodularity. Incidental CT findings: Biapical scarring noted with pleural calcifications along the superior aspect of the left major fissure. Mild central airway thickening. Atherosclerosis of the aorta, great vessels and coronary arteries noted. ABDOMEN/PELVIS: There is no hypermetabolic activity within the liver, adrenal glands, spleen or pancreas. Long segment anorectal hypermetabolic activity extends approximately 4 cm in length and has an SUV max of 9.0. No other hypermetabolic activity identified within the bowel. There are hypermetabolic right inguinal and external  iliac lymph nodes. The largest node in the right groin measures 1.7 cm short axis on image 168/4 and has an SUV max of 12.8. More proximal node measuring 1.2 cm on image 164/4 has an SUV max of 8.0. There is a right external iliac node measuring 9 mm short axis on image 156/4 which has an SUV max of 9.1. No definite hypermetabolic abdominal, left pelvic or left inguinal lymph nodes. Incidental CT findings: Aortic and branch vessel atherosclerosis. SKELETON: There is no hypermetabolic activity to suggest osseous metastatic disease. Incidental CT findings: Status post bilateral total hip arthroplasty with associated susceptibility artifact. Mild lumbar spondylosis. Incidental sacral Tarlov cyst noted. IMPRESSION: 1. Long segment anorectal hypermetabolic activity consistent with known primary malignancy. 2. Hypermetabolic right inguinal and external iliac lymph nodes consistent with nodal metastases. 3. No evidence of distant metastatic disease. 4.  Aortic Atherosclerosis (ICD10-I70.0). Electronically Signed   By: Elmon Hagedorn M.D.   On: 01/25/2024 14:39   ADDENDUM:  Patient was personally and independently interviewed, examined and relevant elements of the history of present illness were reviewed in details and an  assessment and plan was created. All elements of the patient's history of present illness, assessment and plan were discussed in detail with Jacqualin Mate, NP. The above documentation reflects our combined findings assessment and plan.   Briefly, 68 year old lady with previous history of superficial invasive squamous cell carcinoma of the anus, s/p surgical resection in August 2023, presented recently with rectal discomfort for several months and was later confirmed to have invasive squamous cell carcinoma, diagnosed in April 2025.  Clinical stage III with hypermetabolic right inguinal and external iliac lymph nodes.  She was started on concurrent chemoradiation with 5-FU and mitomycin on 01/31/2024.   She presented to the ED yesterday with complaints of chest pain and was diagnosed with pericarditis.  5-FU pump stopped this morning, which was supposed to have technically completed tomorrow, 02/04/2024.  She got 75% of the planned dose.  Cardiology has been on board and left heart cath earlier this morning apparently showed no evidence of coronary blockages.  5-FU is known to cause cardiac arrhythmias and MI but there are only couple of case reports on literature review where it was noted that 5-FU could have caused pericarditis.  Mitomycin is not known to be associated with pericarditis, unless it was used for treating pericardial effusion or tamponade.   Agree with continuing indomethacin.  Consider colchicine if not contraindicated by cardiology.  Given that the patient is still in significant discomfort, it is okay to take a break from cancer directed therapies today and tomorrow.  Radiation therapy can be resumed from Monday, if patient feels better clinically.  Next cycle of 5-FU and mitomycin is not due until 4 weeks from now.  However we will have to find alternative regimens.  There is some data in switching 5-FU with Xeloda.  I will discuss this further with patient's primary oncologist Dr. Maryalice Smaller, upon her return to office next week.  Thank you for giving us  an opportunity to participate in her care.  Please call us  with any questions or concerns.

## 2024-02-03 NOTE — Plan of Care (Signed)
  Problem: Pain Managment: Goal: General experience of comfort will improve and/or be controlled Outcome: Progressing   Problem: Safety: Goal: Ability to remain free from injury will improve Outcome: Progressing   Problem: Skin Integrity: Goal: Risk for impaired skin integrity will decrease Outcome: Progressing

## 2024-02-03 NOTE — Progress Notes (Signed)
 PROGRESS NOTE    Andrea [name]  WUX:[id number] DOB: October 17, 1955 DOA: 02/02/2024 PCP: Alejandro Hurt, FNP   Brief Narrative: 68 year old with past medical history significant for ulcerative colitis, anal cancer on chemotherapy and radiation, chronic anemia, hyperlipidemia, hypertension, depression presents complaining of chest pain.  Patient had a PICC line placed on Monday for her chemotherapy and radiation.  She is on 5-FU and mitomycin.  Developed suprasternal chest pain.  Evaluated by cardiology patient thought to have pericarditis.   Assessment & Plan:   Principal Problem:   Pleuritic chest pain Active Problems:   CAD in native artery   Aortic atherosclerosis (HCC)  1-Chest pain, acute pericarditis Elevation of troponin History of nonobstructive CAD CTA  chest negative for PE or type a dissection Plan to proceed with cath and start colchicine.  Per GI ok to use indomethacin if needed.   Hypertension:  Hold triamterene hydrochlorothiazide and losartan  for now, pre cath ,  monitor renal function Resume if Blood pressure increases  AKI;  Cr baseline 0.8--1.0 Increased to 1.4 Hold ARB and Diuretics.  Will start Low rate IV fluids post cath  Hyperglycemia: Monitor cbg A1c: 5.7 Pre diabetic  Hyperlipidemia - Continue Zetia   Chronic normocytic anemia - Setting of malignancy, monitor  Ulcerative colitis Continue follow-up outpatient with GI GI note well-controlled on Entyvio   Tachycardia: Cardiology informed for rate changes. Follow Bmet and troponin   Stage III anal cancer Of note patient had a superficial squamous cell carcinoma resected 80/2023.  She had rectal discomfort and biopsy performed on 2.3 cm mass on/2025 which confirmed invasive SCC.  PET scan showed stage III anal cancer.  Follows with Dr. Maryalice Smaller oncology.  Depression: Continue paroxetine  Estimated body mass index is 28.62 kg/m as calculated from the following:   Height as of this encounter: 5'  5" (1.651 m).   Weight as of this encounter: 78 kg.   DVT prophylaxis: SCD Code Status: Full code Family Communication: Daughter in law who was at bedside Disposition Plan:  Status is: Observation The patient remains OBS appropriate and will d/c before 2 midnights.    Consultants:  Cardiology   Procedures:    Antimicrobials:    Subjective: Patient seen this morning she still having chest pain on and off, reports that Dilaudid  helped longest.  Nitroglycerin  helped it quickly. She is worried about using NSAIDs and her ulcerative colitis She developed some tachycardia transient  Objective: Vitals:   02/03/24 0545 02/03/24 0600 02/03/24 0615 02/03/24 0751  BP: 121/64 (!) 93/54 (!) 92/56 (!) 97/56  Pulse: 79 72 79   Resp: 16 17 16    Temp:      TempSrc:      SpO2: 100% 100% 100%   Weight:      Height:       No intake or output data in the 24 hours ending 02/03/24 0817 Filed Weights   02/02/24 1040  Weight: 78 kg    Examination:  General exam: Appears calm and comfortable  Respiratory system: Clear to auscultation. Respiratory effort normal. Cardiovascular system: S1 & S2 heard, RRR.  Gastrointestinal system: Abdomen is nondistended, soft and nontender. No organomegaly or masses felt. Normal bowel sounds heard. Central nervous system: Alert and oriented.  Extremities: Symmetric 5 x 5 power.   Data Reviewed: I have personally reviewed following labs and imaging studies  CBC: Recent Labs  Lab 01/31/24 1352 02/02/24 1103 02/02/24 2013  WBC 6.9 5.1  --   NEUTROABS 4.2 3.9  --  HGB 10.9* 11.5* 10.9*  HCT 31.6* 34.8* 32.0*  MCV 84.0 87.4  --   PLT 230 198  --    Basic Metabolic Panel: Recent Labs  Lab 01/31/24 1352 02/02/24 1103 02/02/24 2013  NA 137 137 137  K 4.3 3.9 3.5  CL 106 105 103  CO2 26 21*  --   GLUCOSE 99 140* 153*  BUN 23 26* 26*  CREATININE 1.05* 1.23* 1.30*  CALCIUM  9.5 10.0  --    GFR: Estimated Creatinine Clearance: 42.2  mL/min (A) (by C-G formula based on SCr of 1.3 mg/dL (H)). Liver Function Tests: Recent Labs  Lab 01/31/24 1352 02/02/24 1103  AST 21 26  ALT 15 19  ALKPHOS 87 46  BILITOT 0.4 0.8  PROT 7.1 7.0  ALBUMIN 4.1 3.8   Recent Labs  Lab 02/02/24 1103  LIPASE 39   No results for input(s): "AMMONIA" in the last 168 hours. Coagulation Profile: No results for input(s): "INR", "PROTIME" in the last 168 hours. Cardiac Enzymes: No results for input(s): "CKTOTAL", "CKMB", "CKMBINDEX", "TROPONINI" in the last 168 hours. BNP (last 3 results) No results for input(s): "PROBNP" in the last 8760 hours. HbA1C: Recent Labs    02/02/24 1653  HGBA1C 5.7*   CBG: No results for input(s): "GLUCAP" in the last 168 hours. Lipid Profile: Recent Labs    02/03/24 0508  CHOL 122  HDL 67  LDLCALC 44  TRIG 53  CHOLHDL 1.8   Thyroid Function Tests: No results for input(s): "TSH", "T4TOTAL", "FREET4", "T3FREE", "THYROIDAB" in the last 72 hours. Anemia Panel: No results for input(s): "VITAMINB12", "FOLATE", "FERRITIN", "TIBC", "IRON", "RETICCTPCT" in the last 72 hours. Sepsis Labs: No results for input(s): "PROCALCITON", "LATICACIDVEN" in the last 168 hours.  No results found for this or any previous visit (from the past 240 hours).       Radiology Studies: DG Chest Port 1 View Result Date: 02/02/2024 CLINICAL DATA:  Status post PICC line placement. EXAM: PORTABLE CHEST 1 VIEW COMPARISON:  June 20, 2023 FINDINGS: A right-sided PICC line is seen with its distal tip approximately 1.8 cm distal to the junction of the superior vena cava and right atrium. The heart size and mediastinal contours are within normal limits. There is moderate severity calcification of the aortic arch. Stable mild to moderate severity biapical scarring and/or atelectasis is seen. No pleural effusion or pneumothorax is identified. The visualized skeletal structures are unremarkable. IMPRESSION: Right-sided PICC line  positioning, as described above. Electronically Signed   By: Virgle Grime M.D.   On: 02/02/2024 23:24   CT Angio Chest PE W and/or Wo Contrast Result Date: 02/02/2024 CLINICAL DATA:  Chest pain. EXAM: CT ANGIOGRAPHY CHEST WITH CONTRAST TECHNIQUE: Multidetector CT imaging of the chest was performed using the standard protocol during bolus administration of intravenous contrast. Multiplanar CT image reconstructions and MIPs were obtained to evaluate the vascular anatomy. RADIATION DOSE REDUCTION: This exam was performed according to the departmental dose-optimization program which includes automated exposure control, adjustment of the mA and/or kV according to patient size and/or use of iterative reconstruction technique. CONTRAST:  65mL OMNIPAQUE  IOHEXOL  350 MG/ML SOLN COMPARISON:  Jan 25, 2024. FINDINGS: Cardiovascular: Satisfactory opacification of the pulmonary arteries to the segmental level. No evidence of pulmonary embolism. Normal heart size. No pericardial effusion. Mediastinum/Nodes: No enlarged mediastinal, hilar, or axillary lymph nodes. Thyroid gland, trachea, and esophagus demonstrate no significant findings. Lungs/Pleura: No pneumothorax or pleural effusion is noted. Mild biapical scarring is noted. Upper Abdomen: No  acute abnormality. Musculoskeletal: No chest wall abnormality. No acute or significant osseous findings. Review of the MIP images confirms the above findings. IMPRESSION: No definite evidence of pulmonary embolus. Electronically Signed   By: Rosalene Colon M.D.   On: 02/02/2024 13:14        Scheduled Meds:  atorvastatin   40 mg Oral Daily   enoxaparin  (LOVENOX ) injection  40 mg Subcutaneous Q24H   ezetimibe   10 mg Oral Daily   indomethacin  25 mg Oral TID WC   losartan   25 mg Oral Daily   pantoprazole (PROTONIX) IV  40 mg Intravenous QHS   PARoxetine  10 mg Oral Daily   triamterene-hydrochlorothiazide  1 tablet Oral Daily   Continuous Infusions:   LOS: 0 days     Time spent: 35 minutes    Wanona Stare A Toya Palacios, MD [facility]   If 7PM-7AM, please contact night-coverage [email or web address]  02/03/2024, 8:17 AM

## 2024-02-04 ENCOUNTER — Inpatient Hospital Stay

## 2024-02-04 ENCOUNTER — Inpatient Hospital Stay (HOSPITAL_COMMUNITY)

## 2024-02-04 ENCOUNTER — Other Ambulatory Visit (HOSPITAL_COMMUNITY)

## 2024-02-04 ENCOUNTER — Ambulatory Visit

## 2024-02-04 DIAGNOSIS — R569 Unspecified convulsions: Secondary | ICD-10-CM | POA: Diagnosis not present

## 2024-02-04 DIAGNOSIS — I319 Disease of pericardium, unspecified: Secondary | ICD-10-CM

## 2024-02-04 DIAGNOSIS — M79661 Pain in right lower leg: Secondary | ICD-10-CM | POA: Diagnosis not present

## 2024-02-04 DIAGNOSIS — I10 Essential (primary) hypertension: Secondary | ICD-10-CM

## 2024-02-04 DIAGNOSIS — R0781 Pleurodynia: Secondary | ICD-10-CM | POA: Diagnosis not present

## 2024-02-04 LAB — AMMONIA: Ammonia: 20 umol/L (ref 9–35)

## 2024-02-04 LAB — BASIC METABOLIC PANEL WITH GFR
Anion gap: 7 (ref 5–15)
BUN: 32 mg/dL — ABNORMAL HIGH (ref 8–23)
CO2: 24 mmol/L (ref 22–32)
Calcium: 8.8 mg/dL — ABNORMAL LOW (ref 8.9–10.3)
Chloride: 106 mmol/L (ref 98–111)
Creatinine, Ser: 1.17 mg/dL — ABNORMAL HIGH (ref 0.44–1.00)
GFR, Estimated: 51 mL/min — ABNORMAL LOW (ref >60)
Glucose, Bld: 126 mg/dL — ABNORMAL HIGH (ref 70–99)
Potassium: 4 mmol/L (ref 3.5–5.1)
Sodium: 137 mmol/L (ref 135–145)

## 2024-02-04 LAB — TSH: TSH: 3.146 u[IU]/mL (ref 0.350–4.500)

## 2024-02-04 LAB — CBC
HCT: 27.7 % — ABNORMAL LOW (ref 36.0–46.0)
Hemoglobin: 9.4 g/dL — ABNORMAL LOW (ref 12.0–15.0)
MCH: 29.4 pg (ref 26.0–34.0)
MCHC: 33.9 g/dL (ref 30.0–36.0)
MCV: 86.6 fL (ref 80.0–100.0)
Platelets: 128 10*3/uL — ABNORMAL LOW (ref 150–400)
RBC: 3.2 MIL/uL — ABNORMAL LOW (ref 3.87–5.11)
RDW: 13.6 % (ref 11.5–15.5)
WBC: 3.1 10*3/uL — ABNORMAL LOW (ref 4.0–10.5)
nRBC: 0 % (ref 0.0–0.2)

## 2024-02-04 LAB — PHOSPHORUS: Phosphorus: 2.4 mg/dL — ABNORMAL LOW (ref 2.5–4.6)

## 2024-02-04 LAB — GLUCOSE, CAPILLARY: Glucose-Capillary: 131 mg/dL — ABNORMAL HIGH (ref 70–99)

## 2024-02-04 LAB — MAGNESIUM: Magnesium: 2.2 mg/dL (ref 1.7–2.4)

## 2024-02-04 MED ORDER — PANTOPRAZOLE SODIUM 40 MG PO TBEC
40.0000 mg | DELAYED_RELEASE_TABLET | Freq: Two times a day (BID) | ORAL | Status: DC
  Administered 2024-02-04: 40 mg via ORAL
  Filled 2024-02-04: qty 1

## 2024-02-04 MED ORDER — ENSURE ENLIVE PO LIQD
237.0000 mL | Freq: Three times a day (TID) | ORAL | Status: DC
  Administered 2024-02-04 (×2): 237 mL via ORAL

## 2024-02-04 MED ORDER — TRIAMTERENE-HCTZ 37.5-25 MG PO TABS: 1.0000 | ORAL_TABLET | Freq: Every day | ORAL | 0 refills | Status: DC

## 2024-02-04 MED ORDER — COLCHICINE 0.6 MG PO TABS: 0.6000 mg | ORAL_TABLET | Freq: Two times a day (BID) | ORAL | 1 refills | Status: DC

## 2024-02-04 MED ORDER — K PHOS MONO-SOD PHOS DI & MONO 155-852-130 MG PO TABS: 500.0000 mg | ORAL_TABLET | ORAL | 0 refills | Status: AC

## 2024-02-04 MED ORDER — K PHOS MONO-SOD PHOS DI & MONO 155-852-130 MG PO TABS
500.0000 mg | ORAL_TABLET | ORAL | Status: DC
  Administered 2024-02-04: 500 mg via ORAL
  Filled 2024-02-04 (×2): qty 2

## 2024-02-04 MED ORDER — INDOMETHACIN 25 MG PO CAPS: 25.0000 mg | ORAL_CAPSULE | Freq: Three times a day (TID) | ORAL | 0 refills | Status: AC

## 2024-02-04 MED ORDER — PANTOPRAZOLE SODIUM 40 MG PO TBEC: 40.0000 mg | DELAYED_RELEASE_TABLET | Freq: Two times a day (BID) | ORAL | 0 refills | Status: DC

## 2024-02-04 MED ORDER — LOSARTAN POTASSIUM 25 MG PO TABS
25.0000 mg | ORAL_TABLET | Freq: Every day | ORAL | Status: DC
  Administered 2024-02-04: 25 mg via ORAL
  Filled 2024-02-04: qty 1

## 2024-02-04 NOTE — Discharge Summary (Signed)
 Physician Discharge Summary   Patient: Andrea [name] MRN: [id number] DOB: 01-13-1956  Admit date:     02/02/2024  Discharge date: 02/04/24  Discharge Physician: Danette Duos   PCP: Alejandro Hurt, FNP   Recommendations at discharge:   Follow up with Cardiology for further care of Pericarditis.  Follow up with oncology for further discussion of Chemotherapy tx for Anal; cancer.    Discharge Diagnoses: Principal Problem:   Pleuritic chest pain Active Problems:   Anal squamous cell carcinoma (HCC)   CAD in native artery   Aortic atherosclerosis (HCC)   Non-ST elevation (NSTEMI) myocardial infarction [facility])   Chest pain   Myopericarditis   Primary hypertension  Resolved Problems:   * No resolved hospital problems. *  Hospital Course: 68 year old with past medical history significant for ulcerative colitis, anal cancer on chemotherapy and radiation, chronic anemia, hyperlipidemia, hypertension, depression presents complaining of chest pain.  Patient had a PICC line placed on Monday for her chemotherapy and radiation.  She is on 5-FU and mitomycin.  Developed suprasternal chest pain.  Evaluated by cardiology patient thought to have pericarditis.   Assessment and Plan: 1-Chest pain, acute pericarditis Elevation of troponin History of nonobstructive CAD CTA  chest negative for PE or type a dissection Underwent cath which did not showed significant CAD,. and started  colchicine.  Per GI ok to use indomethacin if needed.   plan to do 1 week of indomethacin. Added Protonix for GI prophylaxis.   Hypertension:  Hold triamterene hydrochlorothiazide and losartan  for now, pre cath.  Cardiology resume Cozaar  and plan to resume hydrochlorothiazide triamterene tomorrow.  Sporadic muscle twitching:  ABG negative for hypoxia or hypercapnia.  No significant electrolytes abnormalities , mild hypophosphatemia. Replaced .  EEG negative.  Ammonia negative.  TSH normal.  Compazine   discontinue. Observed.   Right leg pain; doppler negative  Hypophosphatemia: replaced.   AKI;  Cr baseline 0.8--1.0 Increased to 1.4 Hold ARB and Diuretics.  Received IV fluids. Resolved.    Hyperglycemia: Monitor cbg A1c: 5.7 Pre diabetic   Hyperlipidemia - Continue Zetia    Chronic normocytic anemia - Setting of malignancy, monitor   Ulcerative colitis Continue follow-up outpatient with GI GI note well-controlled on Entyvio    Tachycardia: Cardiology informed for rate changes. Follow Bmet and troponin  Resolved.    Stage III anal cancer Of note patient had a superficial squamous cell carcinoma resected 80/2023.  She had rectal discomfort and biopsy performed on 2.3 cm mass on/2025 which confirmed invasive SCC.  PET scan showed stage III anal cancer.  Follows with Dr. Maryalice Smaller oncology. Completed 75 % Tx. Plan to hold anymore tx for now. Ok to remove PICC line.   Depression: Continue paroxetine.   Estimated body mass index is 28.62 kg/m as calculated from the following:   Height as of this encounter: 5\' 5"  (1.651 m).   Weight as of this encounter: 78 kg.            Consultants: Cardiology, Oncology  Procedures performed: ECHO Disposition: Home Diet recommendation:  Discharge Diet Orders (From admission, onward)     Start     Ordered   02/04/24 0000  Diet - low sodium heart healthy        02/04/24 1436           Cardiac diet DISCHARGE MEDICATION: Allergies as of 02/04/2024       Reactions   Codeine Nausea And Vomiting        Medication List  STOP taking these medications    prochlorperazine  10 MG tablet Commonly known as: COMPAZINE        TAKE these medications    acetaminophen  500 MG tablet Commonly known as: TYLENOL  Take 1,000 mg by mouth every 6 (six) hours as needed for moderate pain.   albuterol  108 (90 Base) MCG/ACT inhaler Commonly known as: VENTOLIN  HFA Inhale 1-2 puffs into the lungs every 4 (four) hours as needed for  wheezing or shortness of breath.   ammonium lactate 12 % lotion Commonly known as: LAC-HYDRIN Apply 1 Application topically daily.   atorvastatin  80 MG tablet Commonly known as: LIPITOR TAKE ONE TABLET BY MOUTH DAILY What changed: additional instructions   colchicine 0.6 MG tablet Take 1 tablet (0.6 mg total) by mouth 2 (two) times daily.   Entyvio  300 MG injection Generic drug: vedolizumab  Inject 300 mg into the vein every 8 (eight) weeks.   ezetimibe  10 MG tablet Commonly known as: ZETIA  TAKE 1 TABLET BY MOUTH DAILY   fluticasone  50 MCG/ACT nasal spray Commonly known as: FLONASE  Place 2 sprays into both nostrils daily. What changed:  when to take this reasons to take this   Imitrex 25 MG tablet Generic drug: SUMAtriptan Take 25 mg by mouth every 2 (two) hours as needed for migraine.   indomethacin 25 MG capsule Commonly known as: INDOCIN Take 1 capsule (25 mg total) by mouth 3 (three) times daily with meals for 6 days.   losartan  25 MG tablet Commonly known as: COZAAR  Take 25 mg by mouth daily.   nitroGLYCERIN  0.4 MG SL tablet Commonly known as: NITROSTAT  Place 1 tablet (0.4 mg total) under the tongue every 5 (five) minutes as needed for chest pain.   ondansetron  8 MG tablet Commonly known as: Zofran  Take 1 tablet (8 mg total) by mouth every 8 (eight) hours as needed for nausea or vomiting.   pantoprazole 40 MG tablet Commonly known as: PROTONIX Take 1 tablet (40 mg total) by mouth 2 (two) times daily.   PARoxetine 20 MG tablet Commonly known as: PAXIL Take 10 mg by mouth daily.   phosphorus 155-852-130 MG tablet Commonly known as: K PHOS NEUTRAL Take 2 tablets (500 mg total) by mouth every 4 (four) hours for 3 days.   triamterene-hydrochlorothiazide 37.5-25 MG tablet Commonly known as: MAXZIDE-25 Take 1 tablet by mouth daily for 10 days. Start taking on: Feb 05, 2024        Discharge Exam: Cleavon Curls Weights   02/02/24 1040  Weight: 78 kg    General; NAD  Condition at discharge: stable  The results of significant diagnostics from this hospitalization (including imaging, microbiology, ancillary and laboratory) are listed below for reference.   Imaging Studies: VAS US  LOWER EXTREMITY VENOUS (DVT) Result Date: 02/04/2024  Lower Venous DVT Study Patient Name:  MACHELLE CHERIAN  Date of Exam:   02/04/2024 Medical Rec #: [id number]        Accession #:    [id number] Date of Birth: Jan 08, 1956         Patient Gender: F Patient Age:   66 years Exam Location:  [facility] Procedure:      VAS US  LOWER EXTREMITY VENOUS (DVT) Referring Phys: Clifford Dam Stephane Junkins --------------------------------------------------------------------------------  Indications: Pain, and right upper thigh.  Risk Factors: Cancer. Anticoagulation: Lovenox . Comparison Study: 10/27/23 Performing Technologist: Franky Ivanoff Sturdivant-Jones RDMS, RVT  Examination Guidelines: A complete evaluation includes B-mode imaging, spectral Doppler, color Doppler, and power Doppler as needed of all accessible portions of each vessel. Bilateral  testing is considered an integral part of a complete examination. Limited examinations for reoccurring indications may be performed as noted. The reflux portion of the exam is performed with the patient in reverse Trendelenburg.  +---------+---------------+---------+-----------+----------+--------------+ RIGHT    CompressibilityPhasicitySpontaneityPropertiesThrombus Aging +---------+---------------+---------+-----------+----------+--------------+ CFV      Full           Yes                                          +---------+---------------+---------+-----------+----------+--------------+ SFJ      Full                                                        +---------+---------------+---------+-----------+----------+--------------+ FV Prox  Full                                                         +---------+---------------+---------+-----------+----------+--------------+ FV Mid   Full                                                        +---------+---------------+---------+-----------+----------+--------------+ FV DistalFull                                                        +---------+---------------+---------+-----------+----------+--------------+ PFV      Full                                                        +---------+---------------+---------+-----------+----------+--------------+ POP      Full           Yes                                          +---------+---------------+---------+-----------+----------+--------------+ PTV      Full                                                        +---------+---------------+---------+-----------+----------+--------------+ PERO     Full                                                        +---------+---------------+---------+-----------+----------+--------------+   +---------+---------------+---------+-----------+----------+--------------+ LEFT  CompressibilityPhasicitySpontaneityPropertiesThrombus Aging +---------+---------------+---------+-----------+----------+--------------+ CFV      Full           Yes      Yes                                 +---------+---------------+---------+-----------+----------+--------------+ SFJ      Full                                                        +---------+---------------+---------+-----------+----------+--------------+ FV Prox  Full                                                        +---------+---------------+---------+-----------+----------+--------------+ FV Mid   Full                                                        +---------+---------------+---------+-----------+----------+--------------+ FV DistalFull                                                         +---------+---------------+---------+-----------+----------+--------------+ PFV      Full                                                        +---------+---------------+---------+-----------+----------+--------------+ POP      Full           Yes      Yes                                 +---------+---------------+---------+-----------+----------+--------------+ PTV      Full                                                        +---------+---------------+---------+-----------+----------+--------------+ PERO     Full                                                        +---------+---------------+---------+-----------+----------+--------------+    Summary: BILATERAL: - No evidence of deep vein thrombosis seen in the lower extremities, bilaterally. -No evidence of popliteal cyst, bilaterally.   *See table(s) above for measurements and observations.    Preliminary    CARDIAC CATHETERIZATION Result Date: 02/03/2024   Prox LAD lesion is 20%  stenosed.   Prox RCA lesion is 20% stenosed. 1.  Mild nonobstructive coronary artery disease. 2.  Left ventricular angiography was not performed.  EF was normal by echo.  Moderately elevated left ventricular end-diastolic pressure at 26 mmHg. Recommendations: No culprit is identified for chest pain and elevated troponin.  Suspect myopericarditis based on symptoms and EKG changes. Colchicine 0.6 mg twice daily was added. I discontinued heparin  drip.   ECHOCARDIOGRAM COMPLETE Result Date: 02/03/2024    ECHOCARDIOGRAM REPORT   Patient Name:   ANEL DAVIDSON Date of Exam: 02/03/2024 Medical Rec #:  [id number]       Height:       65.0 in Accession #:    [id number]      Weight:       172.0 lb Date of Birth:  July 20, 1956        BSA:          1.855 m Patient Age:    68 years        BP:           110/63 mmHg Patient Gender: F               HR:           70 bpm. Exam Location:  Inpatient Procedure: 2D Echo, Color Doppler, Cardiac Doppler and Intracardiac             Opacification Agent (Both Spectral and Color Flow Doppler were            utilized during procedure). Indications:    Chest Pain  History:        Patient has prior history of Echocardiogram examinations.                 Signs/Symptoms:Dyspnea; Risk Factors:Hypertension.  Sonographer:    Willey Harrier Referring Phys: [id number] ZANE ADAMS  Sonographer Comments: Technically difficult study due to poor echo windows. IMPRESSIONS  1. Left ventricular ejection fraction, by estimation, is 55 to 60%. The left ventricle has normal function. The left ventricle has no regional wall motion abnormalities. Left ventricular diastolic parameters were normal.  2. Right ventricular systolic function is normal. The right ventricular size is normal.  3. The mitral valve is degenerative. Trivial mitral valve regurgitation. No evidence of mitral stenosis.  4. The aortic valve is tricuspid. Aortic valve regurgitation is not visualized. Aortic valve sclerosis/calcification is present, without any evidence of aortic stenosis. Aortic valve Vmax measures 1.35 m/s.  5. The inferior vena cava is normal in size with greater than 50% respiratory variability, suggesting right atrial pressure of 3 mmHg. FINDINGS  Left Ventricle: Left ventricular ejection fraction, by estimation, is 55 to 60%. The left ventricle has normal function. The left ventricle has no regional wall motion abnormalities. Definity contrast agent was given IV to delineate the left ventricular  endocardial borders. The left ventricular internal cavity size was normal in size. There is no left ventricular hypertrophy. Left ventricular diastolic parameters were normal. Normal left ventricular filling pressure. Right Ventricle: The right ventricular size is normal. No increase in right ventricular wall thickness. Right ventricular systolic function is normal. Left Atrium: Left atrial size was normal in size. Right Atrium: Right atrial size was normal in size. Pericardium: There is  no evidence of pericardial effusion. Mitral Valve: The mitral valve is degenerative in appearance. There is mild calcification of the mitral valve leaflet(s). Mild mitral annular calcification. Trivial mitral valve regurgitation. No evidence of mitral valve stenosis. MV peak gradient, 4.3  mmHg. The mean mitral valve gradient is 2.0 mmHg. Tricuspid Valve: The tricuspid valve is normal in structure. Tricuspid valve regurgitation is not demonstrated. No evidence of tricuspid stenosis. Aortic Valve: The aortic valve is tricuspid. Aortic valve regurgitation is not visualized. Aortic valve sclerosis/calcification is present, without any evidence of aortic stenosis. Aortic valve peak gradient measures 7.3 mmHg. Pulmonic Valve: The pulmonic valve was normal in structure. Pulmonic valve regurgitation is not visualized. No evidence of pulmonic stenosis. Aorta: The aortic root is normal in size and structure. Venous: The inferior vena cava is normal in size with greater than 50% respiratory variability, suggesting right atrial pressure of 3 mmHg. IAS/Shunts: The interatrial septum appears to be lipomatous. No atrial level shunt detected by color flow Doppler.  LEFT VENTRICLE PLAX 2D LVIDd:         4.20 cm   Diastology LVIDs:         3.20 cm   LV e' medial:    8.27 cm/s LV PW:         0.80 cm   LV E/e' medial:  9.1 LV IVS:        0.90 cm   LV e' lateral:   7.29 cm/s LVOT diam:     2.00 cm   LV E/e' lateral: 10.3 LV SV:         64 LV SV Index:   35 LVOT Area:     3.14 cm  RIGHT VENTRICLE            IVC RV S prime:     9.25 cm/s  IVC diam: 2.50 cm TAPSE (M-mode): 2.0 cm LEFT ATRIUM             Index        RIGHT ATRIUM          Index LA Vol (A2C):   37.0 ml 19.94 ml/m  RA Area:     9.78 cm LA Vol (A4C):   26.9 ml 14.50 ml/m  RA Volume:   20.70 ml 11.16 ml/m LA Biplane Vol: 32.0 ml 17.25 ml/m  AORTIC VALVE AV Area (Vmax): 1.96 cm AV Vmax:        135.00 cm/s AV Peak Grad:   7.3 mmHg LVOT Vmax:      84.20 cm/s LVOT Vmean:      63.800 cm/s LVOT VTI:       0.204 m  AORTA Ao Root diam: 2.60 cm Ao Asc diam:  2.80 cm MITRAL VALVE MV Area (PHT): 3.05 cm    SHUNTS MV Area VTI:   2.38 cm    Systemic VTI:  0.20 m MV Peak grad:  4.3 mmHg    Systemic Diam: 2.00 cm MV Mean grad:  2.0 mmHg MV Vmax:       1.04 m/s MV Vmean:      59.2 cm/s MV Decel Time: 249 msec MV E velocity: 75.20 cm/s MV A velocity: 95.30 cm/s MV E/A ratio:  0.79 Gaylyn Keas MD Electronically signed by Gaylyn Keas MD Signature Date/Time: 02/03/2024/1:28:23 PM    Final    DG Chest Port 1 View Result Date: 02/02/2024 CLINICAL DATA:  Status post PICC line placement. EXAM: PORTABLE CHEST 1 VIEW COMPARISON:  June 20, 2023 FINDINGS: A right-sided PICC line is seen with its distal tip approximately 1.8 cm distal to the junction of the superior vena cava and right atrium. The heart size and mediastinal contours are within normal limits. There is moderate severity calcification of the aortic arch. Stable  mild to moderate severity biapical scarring and/or atelectasis is seen. No pleural effusion or pneumothorax is identified. The visualized skeletal structures are unremarkable. IMPRESSION: Right-sided PICC line positioning, as described above. Electronically Signed   By: Virgle Grime M.D.   On: 02/02/2024 23:24   CT Angio Chest PE W and/or Wo Contrast Result Date: 02/02/2024 CLINICAL DATA:  Chest pain. EXAM: CT ANGIOGRAPHY CHEST WITH CONTRAST TECHNIQUE: Multidetector CT imaging of the chest was performed using the standard protocol during bolus administration of intravenous contrast. Multiplanar CT image reconstructions and MIPs were obtained to evaluate the vascular anatomy. RADIATION DOSE REDUCTION: This exam was performed according to the departmental dose-optimization program which includes automated exposure control, adjustment of the mA and/or kV according to patient size and/or use of iterative reconstruction technique. CONTRAST:  65mL OMNIPAQUE  IOHEXOL  350 MG/ML  SOLN COMPARISON:  Jan 25, 2024. FINDINGS: Cardiovascular: Satisfactory opacification of the pulmonary arteries to the segmental level. No evidence of pulmonary embolism. Normal heart size. No pericardial effusion. Mediastinum/Nodes: No enlarged mediastinal, hilar, or axillary lymph nodes. Thyroid gland, trachea, and esophagus demonstrate no significant findings. Lungs/Pleura: No pneumothorax or pleural effusion is noted. Mild biapical scarring is noted. Upper Abdomen: No acute abnormality. Musculoskeletal: No chest wall abnormality. No acute or significant osseous findings. Review of the MIP images confirms the above findings. IMPRESSION: No definite evidence of pulmonary embolus. Electronically Signed   By: Rosalene Colon M.D.   On: 02/02/2024 13:14   IR PICC PLACEMENT RIGHT <5 YRS INC IMG GUIDE Result Date: 01/31/2024 INDICATION: Patient with history of anal cancer; central venous access requested for chemotherapy EXAM: RIGHT UPPER EXTREMITY PICC LINE PLACEMENT WITH ULTRASOUND AND FLUOROSCOPIC GUIDANCE MEDICATIONS: 4 mL 1% lidocaine  to skin and subcutaneous tissue ANESTHESIA/SEDATION: None FLUOROSCOPY: Radiation Exposure Index (as provided by the fluoroscopic device): 1 mGy Kerma COMPLICATIONS: None immediate. PROCEDURE: The patient was advised of the possible risks and complications and agreed to undergo the procedure. The patient was then brought to the angiographic suite for the procedure. The right arm was prepped with chlorhexidine , draped in the usual sterile fashion using maximum barrier technique (cap and mask, sterile gown, sterile gloves, large sterile sheet, hand hygiene and cutaneous antisepsis) and infiltrated locally with 1% Lidocaine . Ultrasound demonstrated patency of the right basilic vein, and this was documented with an image. Under real-time ultrasound guidance, this vein was accessed with a 21 gauge micropuncture needle and image documentation was performed. A 0.018 wire was introduced in  to the vein. Over this, a 5 [place] dual lumen power injectable PICC was advanced to the lower SVC/right atrial junction. Fluoroscopy during the procedure and fluoro spot radiograph confirms appropriate catheter position. The catheter was flushed and covered with a sterile dressing. Catheter length: 40 cm IMPRESSION: Successful RIGHT arm power PICC line placement with ultrasound and fluoroscopic guidance. The tip of the catheter is positioned at the superior cavo-atrial junction. the catheter is ready for use. Performed by: Wash Hack Electronically Signed   By: Art Largo M.D.   On: 01/31/2024 11:53   NM PET Image Initial (PI) Skull Base To Thigh Result Date: 01/25/2024 CLINICAL DATA:  Initial treatment strategy for anal carcinoma. History of ulcerative colitis. EXAM: NUCLEAR MEDICINE PET SKULL BASE TO THIGH TECHNIQUE: 8.6 mCi F-18 FDG was injected intravenously. Full-ring PET imaging was performed from the skull base to thigh after the radiotracer. CT data was obtained and used for attenuation correction and anatomic localization. Fasting blood glucose: 89 mg/dl COMPARISON:  Limited correlation made  with cardiac CT 08/09/2019. FINDINGS: Mediastinal blood pool activity: SUV max 3.1 NECK: No hypermetabolic cervical lymph nodes are identified.Fairly symmetric activity within the lymphoid tissue of Waldeyer's ring is within physiologic limits. No suspicious activity identified within the pharyngeal mucosal space. Incidental CT findings: Mild left carotid atherosclerosis. CHEST: There are no hypermetabolic mediastinal, hilar or axillary lymph nodes. No hypermetabolic pulmonary activity or suspicious nodularity. Incidental CT findings: Biapical scarring noted with pleural calcifications along the superior aspect of the left major fissure. Mild central airway thickening. Atherosclerosis of the aorta, great vessels and coronary arteries noted. ABDOMEN/PELVIS: There is no hypermetabolic activity within the liver,  adrenal glands, spleen or pancreas. Long segment anorectal hypermetabolic activity extends approximately 4 cm in length and has an SUV max of 9.0. No other hypermetabolic activity identified within the bowel. There are hypermetabolic right inguinal and external iliac lymph nodes. The largest node in the right groin measures 1.7 cm short axis on image 168/4 and has an SUV max of 12.8. More proximal node measuring 1.2 cm on image 164/4 has an SUV max of 8.0. There is a right external iliac node measuring 9 mm short axis on image 156/4 which has an SUV max of 9.1. No definite hypermetabolic abdominal, left pelvic or left inguinal lymph nodes. Incidental CT findings: Aortic and branch vessel atherosclerosis. SKELETON: There is no hypermetabolic activity to suggest osseous metastatic disease. Incidental CT findings: Status post bilateral total hip arthroplasty with associated susceptibility artifact. Mild lumbar spondylosis. Incidental sacral Tarlov cyst noted. IMPRESSION: 1. Long segment anorectal hypermetabolic activity consistent with known primary malignancy. 2. Hypermetabolic right inguinal and external iliac lymph nodes consistent with nodal metastases. 3. No evidence of distant metastatic disease. 4.  Aortic Atherosclerosis (ICD10-I70.0). Electronically Signed   By: Elmon Hagedorn M.D.   On: 01/25/2024 14:39    Microbiology: Results for orders placed or performed during the hospital encounter of 10/14/22  Surgical pcr screen     Status: None   Collection Time: 10/14/22  8:20 AM   Specimen: Nasal Mucosa; Nasal Swab  Result Value Ref Range Status   MRSA, PCR NEGATIVE NEGATIVE Final   Staphylococcus aureus NEGATIVE NEGATIVE Final    Comment: (NOTE) The Xpert SA Assay (FDA approved for NASAL specimens in patients 27 years of age and older), is one component of a comprehensive surveillance program. It is not intended to diagnose infection nor to guide or monitor treatment. Performed at [facility], [address], [place], [place] [address]     Labs: CBC: Recent Labs  Lab 01/31/24 1352 02/02/24 1103 02/02/24 2013 02/03/24 0904 02/03/24 1003 02/04/24 0606  WBC 6.9 5.1  --  3.5*  --  3.1*  NEUTROABS 4.2 3.9  --   --   --   --   HGB 10.9* 11.5* 10.9* 10.6* 10.2* 9.4*  HCT 31.6* 34.8* 32.0* 31.9* 30.0* 27.7*  MCV 84.0 87.4  --  86.4  --  86.6  PLT 230 198  --  171  --  128*   Basic Metabolic Panel: Recent Labs  Lab 01/31/24 1352 02/02/24 1103 02/02/24 2013 02/03/24 0904 02/03/24 1003 02/04/24 0606 02/04/24 1107  NA 137 137 137 138 138 137  --   K 4.3 3.9 3.5 4.1 4.1 4.0  --   CL 106 105 103 103  --  106  --   CO2 26 21*  --  24  --  24  --   GLUCOSE 99 140* 153* 147*  --  126*  --   BUN 23 26* 26* 30*  --  32*  --   CREATININE 1.05* 1.23* 1.30* 1.40*  --  1.17*  --   CALCIUM  9.5 10.0  --  9.5  --  8.8*  --   MG  --   --   --   --   --   --  2.2  PHOS  --   --   --   --   --   --  2.4*   Liver Function Tests: Recent Labs  Lab 01/31/24 1352 02/02/24 1103  AST 21 26  ALT 15 19  ALKPHOS 87 46  BILITOT 0.4 0.8  PROT 7.1 7.0  ALBUMIN 4.1 3.8   CBG: Recent Labs  Lab 02/03/24 0851 02/03/24 1610 02/04/24 0734  GLUCAP 144* 142* 131*    Discharge time spent: greater than 30 minutes.  Signed: Danette Duos, MD [facility] 02/04/2024

## 2024-02-04 NOTE — Evaluation (Signed)
 Physical Therapy Brief Evaluation and Discharge Note Patient Details Name: Andrea [name] MRN: [id number] DOB: [birth year] Today's Date: 02/04/2024   History of Present Illness  68 y.o. female presents to ED 02/02/2024 for the evaluation of chest pain after PICC line placement for chemotherapy and radiation. Found to have elevated troponin, HTN, and AKI PMH:CAD, HTN, HLD, ulcerative colitis, PUD, anal cancer currently on chemotherapy  Clinical Impression  PTA pt living alone in single story home with 4 steps to enter. Pt was independent with ambulation, ADLs and iADL. Pt is currently limited in safe mobility by generalized weakness, and mild instability. Pt is over mod I for bed mobility and supervision for transfers, ambulation and stair ascent/descent. Pt has no PT or equipment needs. Pt to stay at her son's home until she is feeling steadier. PT signing off.          PT Assessment Patient does not need any further PT services  Assistance Needed at Discharge  Intermittent Supervision/Assistance    Equipment Recommendations None recommended by PT     Precautions/Restrictions Precautions Precautions: None Restrictions Weight Bearing Restrictions Per Provider Order: No        Mobility  Bed Mobility   Supine/Sidelying to sit: Modified independent (Device/Increased time), HOB elevated, Used rails   General bed mobility comments: HoB elevated and use of bedrail  Transfers Overall transfer level: Needs assistance Equipment used: None Transfers: Sit to/from Stand Sit to Stand: Supervision           General transfer comment: supervision for safety, good power up and self steady.    Ambulation/Gait Ambulation/Gait assistance: Supervision Gait Distance (Feet): 150 Feet Assistive device: None Gait Pattern/deviations: Step-through pattern, Drifts right/left Gait Speed: Pace WFL General Gait Details: supervision for safety, mildly unsteady, no overt LoB  Home Activity  Instructions Home Activity Instructions: frequent mobilization to increase endurance  Stairs Stairs: Yes Stairs assistance: Supervision Stair Management: Step to pattern, Forwards, Alternating pattern Number of Stairs: 12 General stair comments: supervision, step to pattern with use of rail to descend and step over step to ascend       Balance Overall balance assessment: Mild deficits observed, not formally tested                        Pertinent Vitals/Pain PT - Brief Vital Signs All Vital Signs Stable: Yes Pain Assessment Pain Assessment: Faces Faces Pain Scale: Hurts a little bit Pain Location: L side Pain Descriptors / Indicators: Tightness, Tender Pain Intervention(s): Limited activity within patient's tolerance, Monitored during session, Repositioned     Home Living Family/patient expects to be discharged to:: Private residence Living Arrangements: Alone Available Help at Discharge: Family;Available PRN/intermittently Home Environment: Stairs to enter  [employer] of Steps: 5 Home Equipment: Grab bars - tub/shower;Hand held shower head;Rollator (4 wheels)   Additional Comments: pt going to stay with her son at discharge    Prior Function Level of Independence: Independent      UE/LE Assessment   UE ROM/Strength/Tone/Coordination: [facility]    LE ROM/Strength/Tone/Coordination: [facility]      Communication   Communication Communication: No apparent difficulties     Cognition Overall Cognitive Status: Appears within functional limits for tasks assessed/performed       General Comments General comments (skin integrity, edema, etc.): VSS on RA        Assessment/Plan       PT Visit Diagnosis Muscle weakness (generalized) (M62.81)    No Skilled PT All education  completed;Patient will have necessary level of assist by caregiver at discharge;Patient is supervision for all activity/mobility   Co-evaluation                AMPAC 6 Clicks  Help needed turning from your back to your side while in a flat bed without using bedrails?: None Help needed moving from lying on your back to sitting on the side of a flat bed without using bedrails?: A Little Help needed moving to and from a bed to a chair (including a wheelchair)?: None Help needed standing up from a chair using your arms (e.g., wheelchair or bedside chair)?: None Help needed to walk in hospital room?: A Little Help needed climbing 3-5 steps with a railing? : A Little 6 Click Score: 21      End of Session   Activity Tolerance: Patient tolerated treatment well Patient left: in chair;with family/visitor present Nurse Communication: Mobility status;Other (comment) (ready for discharge, have asked OT to screen out) PT Visit Diagnosis: Muscle weakness (generalized) (M62.81)     Time: 1478-2956 PT Time Calculation (min) (ACUTE ONLY): 15 min  Charges:   PT Evaluation $PT Eval Low Complexity: 1 Low      Jearldine Cassady B. Jewel Mortimer PT, DPT Acute Rehabilitation Services Please use secure chat or  Call Office [phone]   Verlie Glisson [facility]  02/04/2024, 3:37 PM

## 2024-02-04 NOTE — Progress Notes (Signed)
 Lower ext venous duplex  has been completed. Refer to [facility] under chart review to view preliminary results.   02/04/2024  10:16 AM Mourad Cwikla, Hollace Lund

## 2024-02-04 NOTE — Progress Notes (Signed)
 PICC Removal Note: PICC line removed from RUE. PICC catheter tip visualized and intact. Pressure dressing applied. No redness, ecchymosis, edema, swelling, or drainage noted at site. Instructions provided on post PICC discharge care, including followup notification instructions.  Bedrest ends at 1430.

## 2024-02-04 NOTE — Progress Notes (Signed)
 Progress Note  Patient Name: Andrea [name] Date of Encounter: 02/04/2024  Primary Cardiologist: Gaylyn Keas, MD   Subjective   Patient is evaluated bedside, with family present, reports that overall her symptoms has improved immensely compared to yesterday.  Denies any acute chest pain or shortness of breath today.  Reports intermittent nausea however controlled, no vomiting or abdominal pain.  Inpatient Medications    Scheduled Meds:  atorvastatin   40 mg Oral Daily   Chlorhexidine  Gluconate Cloth  6 each Topical Daily   colchicine  0.6 mg Oral BID   enoxaparin  (LOVENOX ) injection  40 mg Subcutaneous Q24H   ezetimibe   10 mg Oral Daily   feeding supplement  237 mL Oral TID BM   indomethacin  25 mg Oral TID WC   pantoprazole  40 mg Oral BID   PARoxetine  10 mg Oral Daily   sodium chloride  flush  10-40 mL Intracatheter Q12H   sodium chloride  flush  3 mL Intravenous Q12H   Continuous Infusions:  sodium chloride      PRN Meds: sodium chloride , acetaminophen , cyclobenzaprine, HYDROmorphone  (DILAUDID ) injection, naLOXone (NARCAN)  injection, nitroGLYCERIN , ondansetron  (ZOFRAN ) IV, sodium chloride  flush, sodium chloride  flush   Vital Signs    Vitals:   02/04/24 0010 02/04/24 0105 02/04/24 0424 02/04/24 0735  BP: 127/71  103/60 125/68  Pulse: 76 76 67 73  Resp: 18  16 18   Temp: 98.4 F (36.9 C)  98.7 F (37.1 C) 98 F (36.7 C)  TempSrc: Oral  Oral Oral  SpO2: (!) 87% 93% 95% 97%  Weight:      Height:        Intake/Output Summary (Last 24 hours) at 02/04/2024 1157 Last data filed at 02/04/2024 0427 Gross per 24 hour  Intake 360 ml  Output 500 ml  Net -140 ml   Filed Weights   02/02/24 1040  Weight: 78 kg    ECG    No new EKG today  - Personally Reviewed  Physical Exam   GEN: Frail elderly female, laying in bed, no acute distress Cardiac: Regular rate, no murmurs appreciated Respiratory: Clear to auscultation bilaterally.  No wheezing or crackles GI:  Soft, nontender, non-distended  MS: No edema; No deformity. Neuro: Awake, alert, answering questions appropriately Psych: Normal affect   Labs    Chemistry Recent Labs  Lab 01/31/24 1352 01/31/24 1352 02/02/24 1103 02/02/24 2013 02/03/24 0904 02/03/24 1003 02/04/24 0606  NA 137  --  137 137 138 138 137  K 4.3  --  3.9 3.5 4.1 4.1 4.0  CL 106  --  105 103 103  --  106  CO2 26  --  21*  --  24  --  24  GLUCOSE 99  --  140* 153* 147*  --  126*  BUN 23  --  26* 26* 30*  --  32*  CREATININE 1.05*   < > 1.23* 1.30* 1.40*  --  1.17*  CALCIUM  9.5  --  10.0  --  9.5  --  8.8*  PROT 7.1  --  7.0  --   --   --   --   ALBUMIN 4.1  --  3.8  --   --   --   --   AST 21  --  26  --   --   --   --   ALT 15  --  19  --   --   --   --   ALKPHOS 87  --  46  --   --   --   --   BILITOT 0.4  --  0.8  --   --   --   --   GFRNONAA 58*  --  48*  --  41*  --  51*  ANIONGAP 5  --  11  --  11  --  7   < > = values in this interval not displayed.     Hematology Recent Labs  Lab 02/02/24 1103 02/02/24 2013 02/03/24 0904 02/03/24 1003 02/04/24 0606  WBC 5.1  --  3.5*  --  3.1*  RBC 3.98  --  3.69*  --  3.20*  HGB 11.5*   < > 10.6* 10.2* 9.4*  HCT 34.8*   < > 31.9* 30.0* 27.7*  MCV 87.4  --  86.4  --  86.6  MCH 28.9  --  28.7  --  29.4  MCHC 33.0  --  33.2  --  33.9  RDW 13.2  --  13.5  --  13.6  PLT 198  --  171  --  128*   < > = values in this interval not displayed.    Cardiac EnzymesNo results for input(s): "TROPONINI" in the last 168 hours. No results for input(s): "TROPIPOC" in the last 168 hours.   BNP Recent Labs  Lab 02/02/24 1103  BNP 44.3     DDimer No results for input(s): "DDIMER" in the last 168 hours.   Radiology    VAS US  LOWER EXTREMITY VENOUS (DVT) Result Date: 02/04/2024  Lower Venous DVT Study Patient Name:  Andrea [name]  Date of Exam:   02/04/2024 Medical Rec #: [id number]        Accession #:    [id number] Date of Birth: Oct 21, 1955         Patient Gender: F  Patient Age:   68 years Exam Location:  [facility] Procedure:      VAS US  LOWER EXTREMITY VENOUS (DVT) Referring Phys: Clifford Dam REGALADO --------------------------------------------------------------------------------  Indications: Pain, and right upper thigh.  Risk Factors: Cancer. Anticoagulation: Lovenox . Comparison Study: 10/27/23 Performing Technologist: Franky Ivanoff Sturdivant-Jones RDMS, RVT  Examination Guidelines: A complete evaluation includes B-mode imaging, spectral Doppler, color Doppler, and power Doppler as needed of all accessible portions of each vessel. Bilateral testing is considered an integral part of a complete examination. Limited examinations for reoccurring indications may be performed as noted. The reflux portion of the exam is performed with the patient in reverse Trendelenburg.  +---------+---------------+---------+-----------+----------+--------------+ RIGHT    CompressibilityPhasicitySpontaneityPropertiesThrombus Aging +---------+---------------+---------+-----------+----------+--------------+ CFV      Full           Yes                                          +---------+---------------+---------+-----------+----------+--------------+ SFJ      Full                                                        +---------+---------------+---------+-----------+----------+--------------+ FV Prox  Full                                                        +---------+---------------+---------+-----------+----------+--------------+  FV Mid   Full                                                        +---------+---------------+---------+-----------+----------+--------------+ FV DistalFull                                                        +---------+---------------+---------+-----------+----------+--------------+ PFV      Full                                                         +---------+---------------+---------+-----------+----------+--------------+ POP      Full           Yes                                          +---------+---------------+---------+-----------+----------+--------------+ PTV      Full                                                        +---------+---------------+---------+-----------+----------+--------------+ PERO     Full                                                        +---------+---------------+---------+-----------+----------+--------------+   +---------+---------------+---------+-----------+----------+--------------+ LEFT     CompressibilityPhasicitySpontaneityPropertiesThrombus Aging +---------+---------------+---------+-----------+----------+--------------+ CFV      Full           Yes      Yes                                 +---------+---------------+---------+-----------+----------+--------------+ SFJ      Full                                                        +---------+---------------+---------+-----------+----------+--------------+ FV Prox  Full                                                        +---------+---------------+---------+-----------+----------+--------------+ FV Mid   Full                                                        +---------+---------------+---------+-----------+----------+--------------+  FV DistalFull                                                        +---------+---------------+---------+-----------+----------+--------------+ PFV      Full                                                        +---------+---------------+---------+-----------+----------+--------------+ POP      Full           Yes      Yes                                 +---------+---------------+---------+-----------+----------+--------------+ PTV      Full                                                         +---------+---------------+---------+-----------+----------+--------------+ PERO     Full                                                        +---------+---------------+---------+-----------+----------+--------------+    Summary: BILATERAL: - No evidence of deep vein thrombosis seen in the lower extremities, bilaterally. -No evidence of popliteal cyst, bilaterally.   *See table(s) above for measurements and observations.    Preliminary    CARDIAC CATHETERIZATION Result Date: 02/03/2024   Prox LAD lesion is 20% stenosed.   Prox RCA lesion is 20% stenosed. 1.  Mild nonobstructive coronary artery disease. 2.  Left ventricular angiography was not performed.  EF was normal by echo.  Moderately elevated left ventricular end-diastolic pressure at 26 mmHg. Recommendations: No culprit is identified for chest pain and elevated troponin.  Suspect myopericarditis based on symptoms and EKG changes. Colchicine 0.6 mg twice daily was added. I discontinued heparin  drip.   ECHOCARDIOGRAM COMPLETE Result Date: 02/03/2024    ECHOCARDIOGRAM REPORT   Patient Name:   Andrea [name] Date of Exam: 02/03/2024 Medical Rec #:  [id number]       Height:       65.0 in Accession #:    [id number]      Weight:       172.0 lb Date of Birth:  February 22, 1956        BSA:          1.855 m Patient Age:    68 years        BP:           110/63 mmHg Patient Gender: F               HR:           70 bpm. Exam Location:  Inpatient Procedure: 2D Echo, Color Doppler, Cardiac Doppler and Intracardiac            Opacification Agent (Both  Spectral and Color Flow Doppler were            utilized during procedure). Indications:    Chest Pain  History:        Patient has prior history of Echocardiogram examinations.                 Signs/Symptoms:Dyspnea; Risk Factors:Hypertension.  Sonographer:    Willey Harrier Referring Phys: [id number] ZANE ADAMS  Sonographer Comments: Technically difficult study due to poor echo windows. IMPRESSIONS  1. Left ventricular  ejection fraction, by estimation, is 55 to 60%. The left ventricle has normal function. The left ventricle has no regional wall motion abnormalities. Left ventricular diastolic parameters were normal.  2. Right ventricular systolic function is normal. The right ventricular size is normal.  3. The mitral valve is degenerative. Trivial mitral valve regurgitation. No evidence of mitral stenosis.  4. The aortic valve is tricuspid. Aortic valve regurgitation is not visualized. Aortic valve sclerosis/calcification is present, without any evidence of aortic stenosis. Aortic valve Vmax measures 1.35 m/s.  5. The inferior vena cava is normal in size with greater than 50% respiratory variability, suggesting right atrial pressure of 3 mmHg. FINDINGS  Left Ventricle: Left ventricular ejection fraction, by estimation, is 55 to 60%. The left ventricle has normal function. The left ventricle has no regional wall motion abnormalities. Definity contrast agent was given IV to delineate the left ventricular  endocardial borders. The left ventricular internal cavity size was normal in size. There is no left ventricular hypertrophy. Left ventricular diastolic parameters were normal. Normal left ventricular filling pressure. Right Ventricle: The right ventricular size is normal. No increase in right ventricular wall thickness. Right ventricular systolic function is normal. Left Atrium: Left atrial size was normal in size. Right Atrium: Right atrial size was normal in size. Pericardium: There is no evidence of pericardial effusion. Mitral Valve: The mitral valve is degenerative in appearance. There is mild calcification of the mitral valve leaflet(s). Mild mitral annular calcification. Trivial mitral valve regurgitation. No evidence of mitral valve stenosis. MV peak gradient, 4.3 mmHg. The mean mitral valve gradient is 2.0 mmHg. Tricuspid Valve: The tricuspid valve is normal in structure. Tricuspid valve regurgitation is not demonstrated.  No evidence of tricuspid stenosis. Aortic Valve: The aortic valve is tricuspid. Aortic valve regurgitation is not visualized. Aortic valve sclerosis/calcification is present, without any evidence of aortic stenosis. Aortic valve peak gradient measures 7.3 mmHg. Pulmonic Valve: The pulmonic valve was normal in structure. Pulmonic valve regurgitation is not visualized. No evidence of pulmonic stenosis. Aorta: The aortic root is normal in size and structure. Venous: The inferior vena cava is normal in size with greater than 50% respiratory variability, suggesting right atrial pressure of 3 mmHg. IAS/Shunts: The interatrial septum appears to be lipomatous. No atrial level shunt detected by color flow Doppler.  LEFT VENTRICLE PLAX 2D LVIDd:         4.20 cm   Diastology LVIDs:         3.20 cm   LV e' medial:    8.27 cm/s LV PW:         0.80 cm   LV E/e' medial:  9.1 LV IVS:        0.90 cm   LV e' lateral:   7.29 cm/s LVOT diam:     2.00 cm   LV E/e' lateral: 10.3 LV SV:         64 LV SV Index:   35 LVOT Area:  3.14 cm  RIGHT VENTRICLE            IVC RV S prime:     9.25 cm/s  IVC diam: 2.50 cm TAPSE (M-mode): 2.0 cm LEFT ATRIUM             Index        RIGHT ATRIUM          Index LA Vol (A2C):   37.0 ml 19.94 ml/m  RA Area:     9.78 cm LA Vol (A4C):   26.9 ml 14.50 ml/m  RA Volume:   20.70 ml 11.16 ml/m LA Biplane Vol: 32.0 ml 17.25 ml/m  AORTIC VALVE AV Area (Vmax): 1.96 cm AV Vmax:        135.00 cm/s AV Peak Grad:   7.3 mmHg LVOT Vmax:      84.20 cm/s LVOT Vmean:     63.800 cm/s LVOT VTI:       0.204 m  AORTA Ao Root diam: 2.60 cm Ao Asc diam:  2.80 cm MITRAL VALVE MV Area (PHT): 3.05 cm    SHUNTS MV Area VTI:   2.38 cm    Systemic VTI:  0.20 m MV Peak grad:  4.3 mmHg    Systemic Diam: 2.00 cm MV Mean grad:  2.0 mmHg MV Vmax:       1.04 m/s MV Vmean:      59.2 cm/s MV Decel Time: 249 msec MV E velocity: 75.20 cm/s MV A velocity: 95.30 cm/s MV E/A ratio:  0.79 Gaylyn Keas MD Electronically signed by Gaylyn Keas MD Signature Date/Time: 02/03/2024/1:28:23 PM    Final    DG Chest Port 1 View Result Date: 02/02/2024 CLINICAL DATA:  Status post PICC line placement. EXAM: PORTABLE CHEST 1 VIEW COMPARISON:  June 20, 2023 FINDINGS: A right-sided PICC line is seen with its distal tip approximately 1.8 cm distal to the junction of the superior vena cava and right atrium. The heart size and mediastinal contours are within normal limits. There is moderate severity calcification of the aortic arch. Stable mild to moderate severity biapical scarring and/or atelectasis is seen. No pleural effusion or pneumothorax is identified. The visualized skeletal structures are unremarkable. IMPRESSION: Right-sided PICC line positioning, as described above. Electronically Signed   By: Virgle Grime M.D.   On: 02/02/2024 23:24   CT Angio Chest PE W and/or Wo Contrast Result Date: 02/02/2024 CLINICAL DATA:  Chest pain. EXAM: CT ANGIOGRAPHY CHEST WITH CONTRAST TECHNIQUE: Multidetector CT imaging of the chest was performed using the standard protocol during bolus administration of intravenous contrast. Multiplanar CT image reconstructions and MIPs were obtained to evaluate the vascular anatomy. RADIATION DOSE REDUCTION: This exam was performed according to the departmental dose-optimization program which includes automated exposure control, adjustment of the mA and/or kV according to patient size and/or use of iterative reconstruction technique. CONTRAST:  65mL OMNIPAQUE  IOHEXOL  350 MG/ML SOLN COMPARISON:  Jan 25, 2024. FINDINGS: Cardiovascular: Satisfactory opacification of the pulmonary arteries to the segmental level. No evidence of pulmonary embolism. Normal heart size. No pericardial effusion. Mediastinum/Nodes: No enlarged mediastinal, hilar, or axillary lymph nodes. Thyroid gland, trachea, and esophagus demonstrate no significant findings. Lungs/Pleura: No pneumothorax or pleural effusion is noted. Mild biapical scarring is  noted. Upper Abdomen: No acute abnormality. Musculoskeletal: No chest wall abnormality. No acute or significant osseous findings. Review of the MIP images confirms the above findings. IMPRESSION: No definite evidence of pulmonary embolus. Electronically Signed   By: Irving Mantle.D.  On: 02/02/2024 13:14    Cardiac Studies   02/03/2024: LHC   Prox LAD lesion is 20% stenosed.   Prox RCA lesion is 20% stenosed.   1.  Mild nonobstructive coronary artery disease. 2.  Left ventricular angiography was not performed.   EF was normal by echo.   Moderately elevated left ventricular end-diastolic pressure at 26 mmHg.  ==========================================================================================  02/03/2024: ECHO complete   IMPRESSIONS    1. Left ventricular ejection fraction, by estimation, is 55 to 60%. The  left ventricle has normal function. The left ventricle has no regional  wall motion abnormalities. Left ventricular diastolic parameters were  normal.   2. Right ventricular systolic function is normal. The right ventricular  size is normal.   3. The mitral valve is degenerative. Trivial mitral valve regurgitation.  No evidence of mitral stenosis.   4. The aortic valve is tricuspid. Aortic valve regurgitation is not  visualized. Aortic valve sclerosis/calcification is present, without any  evidence of aortic stenosis. Aortic valve Vmax measures 1.35 m/s.   5. The inferior vena cava is normal in size with greater than 50%  respiratory variability, suggesting right atrial pressure of 3 mmHg.   Patient Profile     Andrea [name] is a 68 y.o. female with a hx of CAD, HTN, HLD, ulcerative colitis, PUD, anal cancer currently on chemotherapy who is being seen 02/02/2024 for the evaluation of chest pain at the request of Rosealee Concha MD.   Assessment & Plan    Chest pain 2/2 Acute Pericarditis  Elevated Troponin High sensitivity troponin 32-> 27-> 33-> 43-> 426.  She  was started on chemoradiation medication with 5 FU, first dose given on Monday 5/12. EKG with diffuse concave ST elevation, consistent with acute pericarditis. LHC 5/15 with mild nonobstructive CAD and repeat echo on 5/15 with LVEF 55 to 60%, normal LV and RV function. Per exam today, symptoms improved, denies any acute chest pain or shortness of breath at this time. - ESR 20, CRP 0.6 - Continue Colchicine 0.6 mg BID for 3 months - Per Oncology, Indomethacin 25 mg TID + PPI for 1 week - Recommend follow-up with cardiology in 1 to 2 weeks - Patient was counseled on recurrence of pericarditis, advised on decreasing activity for 1 week, and follow-up with cardiology in about 1 to 2 weeks.   Anal cancer The patient had a superficial squamous cell carcinoma resected on 04/2022. She had rectal discomfort and on 12/2023 had a biopsy performed on a 2-3cm mass that confirmed invasive SCC. PET scan showed Stage III a rectal cancer. She started chemoradiation with 5-FU and mitomycin on 01/31/24. Had PICC placed on 01/31/24. Oncology following.    HTN Home medications consisted of losartan  25mg  daily and Maxide 37.5-25 mg daily.  Blood pressures this morning still low with SBP 110-120s.  - Can consider starting losartan  at 12.5 mg in the outpatient setting and slowly starting home medications as blood pressure tolerates.   Atherosclerosis Coronary calcification Hyperlipidemia Continue atorvastatin  and Zetia .   AKI Improving, with serum creatinine 1.17 from 1.40, baseline around 1.05.  Management per primary    [facility] will sign off. Patient is stable with symptoms improvement Medication Recommendations: Colchicine 0.6 mg twice daily for 3 months, indomethacin 25 mg 3 times daily with PPI for 1 week. Other recommendations (labs, testing, etc): RFP in 1 week Follow up as an outpatient: Cardiology      Signed, Lanney Pitts, DO  DO, PGY 1 02/04/2024, 11:57  AM

## 2024-02-04 NOTE — Progress Notes (Signed)
PHARMACIST - PHYSICIAN COMMUNICATION   CONCERNING: IV to Oral Route Change Policy  RECOMMENDATION: This patient is receiving protonix by the intravenous route.  Based on criteria approved by the Pharmacy and Therapeutics Committee, the intravenous medication(s) is/are being converted to the equivalent oral dose form(s).   DESCRIPTION: These criteria include:  The patient is eating (either orally or via tube) and/or has been taking other orally administered medications for a least 24 hours  The patient has no evidence of active gastrointestinal bleeding or impaired GI absorption (gastrectomy, short bowel, patient on TNA or NPO).  If you have questions about this conversion, please contact the Pharmacy Department  []  ( [phone] )  [place] []  ( [phone] )  [facility] [x]  ( [phone] )  [place] []  ( [phone] )  [facility] []  ( [phone] )  [facility]    Andrew Meyer, PharmD Clinical Pharmacist **Pharmacist phone directory can now be found on [email or web address] ([email or web address]).  Listed under [facility].     

## 2024-02-04 NOTE — Progress Notes (Signed)
 OT Cancellation Note  Patient Details Name: Andrea [name] MRN: [id number] DOB: [birth year]   Cancelled Treatment:    Reason Eval/Treat Not Completed: OT screened, no needs identified, will sign off. Per PT, pt mobilizing well and has adequate family support upon d/c. No acute OT needs identified, OT is signing off on this pt.   Warwick Nick C, OT  Acute Rehabilitation Services Office [phone] Secure chat preferred   Mickael Alamo 02/04/2024, 3:12 PM

## 2024-02-04 NOTE — Procedures (Addendum)
 Routine EEG Report  Andrea [name] is a 68 y.o. female with a history of myoclonus who is undergoing an EEG to evaluate for seizures.  Report: This EEG was acquired with electrodes placed according to the International 10-20 electrode system (including Fp1, Fp2, F3, F4, C3, C4, P3, P4, O1, O2, T3, T4, T5, T6, A1, A2, Fz, Cz, Pz). The following electrodes were missing or displaced: none.  The occipital dominant rhythm was 5-7 Hz. This activity is reactive to stimulation. Drowsiness was manifested by background fragmentation; deeper stages of sleep were identified by K complexes and sleep spindles. There was no focal slowing. There were no interictal epileptiform discharges. There were no electrographic seizures identified. There was no abnormal response to photic stimulation or hyperventilation.   Impression and clinical correlation: This EEG was obtained while awake and asleep and is abnormal due to mild-to-moderate diffuse slowing indicative of global cerebral dysfunction. Epileptiform abnormalities were not seen during this recording.  Greg Leaks, MD [facility] [phone]  If 7pm- 7am, please page neurology on call as listed in AMION.

## 2024-02-04 NOTE — Progress Notes (Signed)
 EEG complete - results pending

## 2024-02-04 NOTE — Plan of Care (Signed)

## 2024-02-05 LAB — ANA W/REFLEX IF POSITIVE: Anti Nuclear Antibody (ANA): NEGATIVE

## 2024-02-07 ENCOUNTER — Other Ambulatory Visit: Payer: Self-pay | Admitting: Hematology

## 2024-02-07 ENCOUNTER — Ambulatory Visit
Admission: RE | Admit: 2024-02-07 | Discharge: 2024-02-07 | Disposition: A | Discharge status: Home / Self Care | Source: Ambulatory Visit | Attending: Radiation Oncology | Admitting: Radiation Oncology

## 2024-02-07 ENCOUNTER — Other Ambulatory Visit: Payer: Self-pay

## 2024-02-07 ENCOUNTER — Telehealth: Payer: Self-pay

## 2024-02-07 DIAGNOSIS — R197 Diarrhea, unspecified: Secondary | ICD-10-CM | POA: Diagnosis not present

## 2024-02-07 DIAGNOSIS — Z5111 Encounter for antineoplastic chemotherapy: Secondary | ICD-10-CM | POA: Diagnosis not present

## 2024-02-07 DIAGNOSIS — K6389 Other specified diseases of intestine: Secondary | ICD-10-CM | POA: Diagnosis not present

## 2024-02-07 DIAGNOSIS — R5383 Other fatigue: Secondary | ICD-10-CM | POA: Diagnosis not present

## 2024-02-07 DIAGNOSIS — R110 Nausea: Secondary | ICD-10-CM | POA: Diagnosis not present

## 2024-02-07 DIAGNOSIS — Z510 Encounter for antineoplastic radiation therapy: Secondary | ICD-10-CM | POA: Diagnosis present

## 2024-02-07 DIAGNOSIS — C210 Malignant neoplasm of anus, unspecified: Secondary | ICD-10-CM

## 2024-02-07 DIAGNOSIS — Z79899 Other long term (current) drug therapy: Secondary | ICD-10-CM | POA: Diagnosis not present

## 2024-02-07 LAB — RAD ONC ARIA SESSION SUMMARY
Course Elapsed Days: 7
Plan Fractions Treated to Date: 3
Plan Prescribed Dose Per Fraction: 1.8 Gy
Plan Total Fractions Prescribed: 30
Plan Total Prescribed Dose: 54 Gy
Reference Point Dosage Given to Date: 5.4 Gy
Reference Point Session Dosage Given: 1.8 Gy
Session Number: 3

## 2024-02-07 MED ORDER — DIPHENOXYLATE-ATROPINE 2.5-0.025 MG PO TABS: 1.0000 | ORAL_TABLET | Freq: Four times a day (QID) | ORAL | 0 refills | Status: DC | PRN

## 2024-02-07 NOTE — Telephone Encounter (Signed)
 Spoke with pt regarding c/o diarrhea.  Pt stated she's had 5 episodes of diarrhea today alone.  Pt stated she called the on-call provider line for the [facility] over the weekend and was told to take 2 imodium with the 1st episode and 1 pill after each additional BM. Pt stated she's taking the Imodium as directed but they are not working.  Pt receives daily radiation for anal cancer.  Pt was recently hospitalized for Pericarditis.  Pt stated she's having severe stomach cramps constantly and having uncontrolled BM's.  Pt denied dizziness and n/v.  Pt stated the stool is extremely foul in smell and watery.  Pt stated she's eating foods like yogurt, bananas, blueberries, and granola.  Pt stated she's drinking plenty of water but encouraged pt to drink Liquid IV, Gatorade, or Pedialyte to rehydrate and replenish electrolytes lost with the increased amounts of bowel movements.  Pt and son verbalized understanding.  Pt was d/c from the hospital on Colchicine , indomethacin , pantoprazole , and phosphorus.  Pt is scheduled to come in on 02/08/2024 for radiation, lab, and OV.  Stated the pt maybe asked to give a stool sample to r/o C-Diff.  Pt stated she should be able to give one as often as she's going to the bathroom.  Stated this nurse will make Dr. Maryalice Smaller and her team aware of the pt's call and will f/u with pt if Dr Maryalice Smaller calls in a prescription for Lomotil .  Pt and son verbalized understanding and stated they will await this nurse's return call.

## 2024-02-08 ENCOUNTER — Other Ambulatory Visit: Payer: Self-pay

## 2024-02-08 ENCOUNTER — Ambulatory Visit
Admission: RE | Admit: 2024-02-08 | Discharge: 2024-02-08 | Disposition: A | Discharge status: Home / Self Care | Source: Ambulatory Visit | Attending: Radiation Oncology | Admitting: Radiation Oncology

## 2024-02-08 ENCOUNTER — Ambulatory Visit
Admission: RE | Admit: 2024-02-08 | Discharge: 2024-02-08 | Disposition: A | Discharge status: Home / Self Care | Payer: Self-pay | Source: Ambulatory Visit | Attending: Radiation Oncology | Admitting: Radiation Oncology

## 2024-02-08 ENCOUNTER — Inpatient Hospital Stay

## 2024-02-08 ENCOUNTER — Encounter: Payer: Self-pay | Admitting: Hematology

## 2024-02-08 ENCOUNTER — Other Ambulatory Visit (HOSPITAL_COMMUNITY): Payer: Self-pay

## 2024-02-08 ENCOUNTER — Encounter: Payer: Self-pay | Admitting: Gastroenterology

## 2024-02-08 ENCOUNTER — Inpatient Hospital Stay (HOSPITAL_BASED_OUTPATIENT_CLINIC_OR_DEPARTMENT_OTHER): Admitting: Hematology

## 2024-02-08 VITALS — BP 117/49 | HR 66 | Resp 18

## 2024-02-08 VITALS — BP 122/48 | HR 72 | Temp 97.8°F | Resp 19 | Ht 65.0 in | Wt 173.8 lb

## 2024-02-08 DIAGNOSIS — Z510 Encounter for antineoplastic radiation therapy: Secondary | ICD-10-CM | POA: Diagnosis not present

## 2024-02-08 DIAGNOSIS — Z95828 Presence of other vascular implants and grafts: Secondary | ICD-10-CM

## 2024-02-08 DIAGNOSIS — C210 Malignant neoplasm of anus, unspecified: Secondary | ICD-10-CM

## 2024-02-08 DIAGNOSIS — R197 Diarrhea, unspecified: Secondary | ICD-10-CM

## 2024-02-08 LAB — RAD ONC ARIA SESSION SUMMARY
Course Elapsed Days: 8
Plan Fractions Treated to Date: 4
Plan Prescribed Dose Per Fraction: 1.8 Gy
Plan Total Fractions Prescribed: 30
Plan Total Prescribed Dose: 54 Gy
Reference Point Dosage Given to Date: 7.2 Gy
Reference Point Session Dosage Given: 1.8 Gy
Session Number: 4

## 2024-02-08 LAB — CMP (CANCER CENTER ONLY)
ALT: 16 U/L (ref 0–44)
AST: 17 U/L (ref 15–41)
Albumin: 3.8 g/dL (ref 3.5–5.0)
Alkaline Phosphatase: 56 U/L (ref 38–126)
Anion gap: 8 (ref 5–15)
BUN: 28 mg/dL — ABNORMAL HIGH (ref 8–23)
CO2: 25 mmol/L (ref 22–32)
Calcium: 8.6 mg/dL — ABNORMAL LOW (ref 8.9–10.3)
Chloride: 105 mmol/L (ref 98–111)
Creatinine: 1.4 mg/dL — ABNORMAL HIGH (ref 0.44–1.00)
GFR, Estimated: 41 mL/min — ABNORMAL LOW (ref >60)
Glucose, Bld: 102 mg/dL — ABNORMAL HIGH (ref 70–99)
Potassium: 3.9 mmol/L (ref 3.5–5.1)
Sodium: 138 mmol/L (ref 135–145)
Total Bilirubin: 0.5 mg/dL (ref 0.0–1.2)
Total Protein: 6.4 g/dL — ABNORMAL LOW (ref 6.5–8.1)

## 2024-02-08 LAB — CBC WITH DIFFERENTIAL (CANCER CENTER ONLY)
Abs Immature Granulocytes: 0.05 10*3/uL (ref 0.00–0.07)
Basophils Absolute: 0 10*3/uL (ref 0.0–0.1)
Basophils Relative: 1 %
Eosinophils Absolute: 0.3 10*3/uL (ref 0.0–0.5)
Eosinophils Relative: 7 %
HCT: 28.5 % — ABNORMAL LOW (ref 36.0–46.0)
Hemoglobin: 9.8 g/dL — ABNORMAL LOW (ref 12.0–15.0)
Immature Granulocytes: 1 %
Lymphocytes Relative: 23 %
Lymphs Abs: 0.8 10*3/uL (ref 0.7–4.0)
MCH: 28.7 pg (ref 26.0–34.0)
MCHC: 34.4 g/dL (ref 30.0–36.0)
MCV: 83.3 fL (ref 80.0–100.0)
Monocytes Absolute: 0.2 10*3/uL (ref 0.1–1.0)
Monocytes Relative: 4 %
Neutro Abs: 2.3 10*3/uL (ref 1.7–7.7)
Neutrophils Relative %: 64 %
Platelet Count: 126 10*3/uL — ABNORMAL LOW (ref 150–400)
RBC: 3.42 MIL/uL — ABNORMAL LOW (ref 3.87–5.11)
RDW: 12.7 % (ref 11.5–15.5)
WBC Count: 3.6 10*3/uL — ABNORMAL LOW (ref 4.0–10.5)
nRBC: 0 % (ref 0.0–0.2)

## 2024-02-08 LAB — CEA (ACCESS): CEA (CHCC): 5.45 ng/mL — ABNORMAL HIGH (ref 0.00–5.00)

## 2024-02-08 MED ORDER — SODIUM CHLORIDE 0.9 % IV SOLN: Freq: Once | INTRAVENOUS | Status: AC

## 2024-02-08 MED ORDER — NYSTATIN 100000 UNIT/ML MT SUSP
5.0000 mL | Freq: Three times a day (TID) | OROMUCOSAL | 1 refills | Status: DC | PRN
  Filled 2024-02-08: qty 140, 10d supply, fill #0

## 2024-02-08 NOTE — Patient Instructions (Signed)

## 2024-02-08 NOTE — Progress Notes (Signed)
 [facility]   [phone] [phone]   Clinic Follow up Note   Patient Care Team: Alejandro Hurt, FNP as PCP - General (Family Medicine) Jacqueline Matsu, MD as PCP - Cardiology (Cardiology) Rhetta Cellar, RN as Oncology Nurse Navigator Sonja Omao, MD as Consulting Physician (Hematology and Oncology)  Date of Service:  02/08/2024  CHIEF COMPLAINT: f/u of anal cancer   CURRENT THERAPY:  Concurrent chemoradiation  Oncology History   Anal squamous cell carcinoma (HCC) cT2N1M0, stage IIIA -previous history of superficially invasive squamous cell carcinoma of anus, status post surgical resection in August 2023, presented with rectal discomfort for several months, and a palpable 2 to 3 cm mass in the anal canal, biopsy confirmed invasive squamous cell carcinoma in April 2025.  -PET Jan 25, 2024 showed long segment anorectal hypermetabolic activity, and hypermetabolic right inguinal and external iliac lymph nodes metastasis.  No distant metastasis. -she started concurrent chemoradiation with 5-FU and mitomycin  on Jan 31, 2024. -she was hospitalized for pericarditis on 02/02/2024  Assessment & Plan  Anal cancer -On concurrent chemoradiation, started last Monday. - She develops significant chest pain during her 5-FU infusion, which was stopped on day 4. - Continue radiation - Will likely give week 5 chemotherapy in the hospital  Chest pain, coronary artery spasm from 5-FU, versus pericarditis - She was hospitalized 2 days after we started concurrent chemoradiation - She is currently on colchicine  for presumed pericarditis - I suspect her symptom is related to 5-FU induced coronary artery spasm - Will refer her to cardiologist Dr. Alease Amend or her cardiologist Dr. Micael Adas for evaluation  Nausea, diarrhea and fatigue - Diarrhea started on day 6 of first week of chemotherapy - Improved with Lomotil  - Will give IV fluids today - We again discussed  symptom management and supportive care  Plan - Will give 1 L normal saline today - Continue daily radiation - Urgent referral to cardiologist Dr. Alease Amend - Follow-up next week     SUMMARY OF ONCOLOGIC HISTORY: Oncology History  Anal squamous cell carcinoma (HCC)  12/29/2023 Pathology Results   A. PERIANAL, RIGHT POSTERIOR, BIOPSY:       Invasive squamous cell carcinoma, moderately differentiated. Suspicious for lymphovascular invasion.  B. PERIANAL, LEFT POSTERIOR, BIOPSY:       High-grade squamous intraepithelial lesion (HSIL / AIN 3).  No evidence of definitive invasion.    01/20/2024 Initial Diagnosis   Anal cancer (HCC)   01/26/2024 Cancer Staging   Staging form: Anus, AJCC 8th Edition - Clinical: Stage IIIA (cT2, cN1c, cM0) - Signed by Sonja Adin, MD on 01/26/2024   01/31/2024 -  Chemotherapy   Patient is on Treatment Plan : ANUS Mitomycin  D1,28 + 5FU D1-4, 28-31 q32d        Discussed the use of AI scribe software for clinical note transcription with the patient, who gave verbal consent to proceed.  History of Present Illness Patient is here for follow-up.  She presents to clinic with her son and daughter.  She developed significant chest pain on day 2 of the chemoradiation, and was admitted to the hospital.  Workup was negative for coronary artery disease, she did have abnormal EKG and elevated troponin.  She was seen by cardiologist, and diagnosed with pericarditis.  She is on colchicine .  5-FU pump infusion was stopped on day 4, her chest pain resolved afterwards.  She developed mild constipation after hospital discharge, took 1 dose Colace, and developed severe diarrhea 2 days ago.  I called in Lomotil  yesterday, her diarrhea has much improved.  No fever, chills, or abdominal cramps.  She does feel lightheaded when she stands up, she feels she is drinking adequately, appetite is low.      All other systems were reviewed with the patient and are negative.  MEDICAL HISTORY:   Past Medical History:  Diagnosis Date   Anemia    Anginal pain (HCC)    Arthritis    Chest discomfort    Disorder of immune system (HCC)    Dyspnea    Dyspnea on exertion    Hypercholesterolemia    Hypertension    Migraine    Non-ulcer dyspepsia    Osteopenia of spine    Peptic ulcer disease    Pneumonia    Polyp of rectum    cancerous   Pre-diabetes    Skin cancer    Ulcerative colitis (HCC)     SURGICAL HISTORY: Past Surgical History:  Procedure Laterality Date   ANKLE FRACTURE SURGERY Left    CARDIAC CATHETERIZATION     COLONOSCOPY     ESOPHAGOGASTRODUODENOSCOPY     hip replacement Bilateral    LEFT HEART CATH AND CORONARY ANGIOGRAPHY N/A 01/21/2022   Procedure: LEFT HEART CATH AND CORONARY ANGIOGRAPHY;  Surgeon: Odie Benne, MD;  Location: [facility];  Service: Cardiovascular;  Laterality: N/A;   LEFT HEART CATH AND CORONARY ANGIOGRAPHY N/A 02/03/2024   Procedure: LEFT HEART CATH AND CORONARY ANGIOGRAPHY;  Surgeon: Wenona Hamilton, MD;  Location: [facility];  Service: Cardiovascular;  Laterality: N/A;   RECTAL BIOPSY N/A 05/04/2022   Procedure: BIOPSY RECTAL VS EXCISION OF PERIANAL LESION UNDER ANOSCOPY;  Surgeon: Melvenia Stabs, MD;  Location: [facility];  Service: General;  Laterality: N/A;   TOTAL KNEE ARTHROPLASTY Left 10/19/2022   Procedure: LEFT TOTAL KNEE ARTHROPLASTY;  Surgeon: Wes Hamman, MD;  Location: [facility];  Service: Orthopedics;  Laterality: Left;   TUBAL LIGATION     TUMOR EXCISION N/A 01/14/2024   Procedure: TRANSRECTAL TUMOR EXCISION;  Surgeon: Melvenia Stabs, MD;  Location: [facility];  Service: General;  Laterality: N/A;  Transanal excision of distal rectal and anal lesion (wide local)    I have reviewed the social history and family history with the patient and they are unchanged from previous note.  ALLERGIES:  is allergic to codeine.  MEDICATIONS:  Current Outpatient Medications   Medication Sig Dispense Refill   acetaminophen  (TYLENOL ) 500 MG tablet Take 1,000 mg by mouth every 6 (six) hours as needed for moderate pain.     albuterol  (VENTOLIN  HFA) 108 (90 Base) MCG/ACT inhaler Inhale 1-2 puffs into the lungs every 4 (four) hours as needed for wheezing or shortness of breath. 8 g 0   ammonium lactate (LAC-HYDRIN) 12 % lotion Apply 1 Application topically daily.     atorvastatin  (LIPITOR) 80 MG tablet TAKE ONE TABLET BY MOUTH DAILY (Patient taking differently: Take 80 mg by mouth daily. Take 1/2 tablet daily) 90 tablet 1   colchicine  0.6 MG tablet Take 1 tablet (0.6 mg total) by mouth 2 (two) times daily. 60 tablet 1   diphenoxylate -atropine  (LOMOTIL ) 2.5-0.025 MG tablet Take 1-2 tablets by mouth 4 (four) times daily as needed for diarrhea or loose stools. 60 tablet 0   ezetimibe  (ZETIA ) 10 MG tablet TAKE 1 TABLET BY MOUTH DAILY 90 tablet 3   fluticasone  (FLONASE ) 50 MCG/ACT nasal spray Place 2 sprays into both nostrils daily. (Patient  taking differently: Place 2 sprays into both nostrils daily as needed for allergies.) 1 g 0   indomethacin  (INDOCIN ) 25 MG capsule Take 1 capsule (25 mg total) by mouth 3 (three) times daily with meals for 6 days. 18 capsule 0   losartan  (COZAAR ) 25 MG tablet Take 25 mg by mouth daily.     ondansetron  (ZOFRAN ) 8 MG tablet Take 1 tablet (8 mg total) by mouth every 8 (eight) hours as needed for nausea or vomiting. 30 tablet 1   pantoprazole  (PROTONIX ) 40 MG tablet Take 1 tablet (40 mg total) by mouth 2 (two) times daily. 60 tablet 0   PARoxetine  (PAXIL ) 20 MG tablet Take 10 mg by mouth daily.     SUMAtriptan (IMITREX) 25 MG tablet Take 25 mg by mouth every 2 (two) hours as needed for migraine.     triamterene -hydrochlorothiazide  (MAXZIDE -25) 37.5-25 MG tablet Take 1 tablet by mouth daily for 10 days. 10 tablet 0   vedolizumab  (ENTYVIO ) 300 MG injection Inject 300 mg into the vein every 8 (eight) weeks.     magic mouthwash w/lidocaine  SOLN Take  5 mLs by mouth 3 (three) times daily as needed for mouth pain. Swish and swallow.  Suspension contains 1:1 equal amounts of Maalox Extra Strength, nystatin, diphenhydramine  and lidocaine . 140 mL 1   nitroGLYCERIN  (NITROSTAT ) 0.4 MG SL tablet Place 1 tablet (0.4 mg total) under the tongue every 5 (five) minutes as needed for chest pain. 25 tablet 2   No current facility-administered medications for this visit.    PHYSICAL EXAMINATION: ECOG PERFORMANCE STATUS: 2 - Symptomatic, <50% confined to bed  Vitals:   02/08/24 1147 02/08/24 1148  BP:  (!) 122/48  Pulse:  72  Resp:    Temp:    SpO2: 99% 98%   Wt Readings from Last 3 Encounters:  02/08/24 173 lb 12.8 oz (78.8 kg)  02/02/24 172 lb (78 kg)  01/31/24 174 lb 4.8 oz (79.1 kg)     GENERAL:alert, no distress and comfortable SKIN: skin color, texture, turgor are normal, no rashes or significant lesions EYES: normal, Conjunctiva are pink and non-injected, sclera clear NECK: supple, thyroid normal size, non-tender, without nodularity LYMPH:  no palpable lymphadenopathy in the cervical, axillary  LUNGS: clear to auscultation and percussion with normal breathing effort HEART: regular rate & rhythm and no murmurs and no lower extremity edema ABDOMEN:abdomen soft, non-tender and normal bowel sounds Musculoskeletal:no cyanosis of digits and no clubbing  NEURO: alert & oriented x 3 with fluent speech, no focal motor/sensory deficits  Physical Exam   LABORATORY DATA:  I have reviewed the data as listed    Latest Ref Rng & Units 02/08/2024   10:48 AM 02/04/2024    6:06 AM 02/03/2024   10:03 AM  CBC  WBC 4.0 - 10.5 K/uL 3.6  3.1    Hemoglobin 12.0 - 15.0 g/dL 9.8  9.4  40.9   Hematocrit 36.0 - 46.0 % 28.5  27.7  30.0   Platelets 150 - 400 K/uL 126  128          Latest Ref Rng & Units 02/08/2024   10:48 AM 02/04/2024    6:06 AM 02/03/2024   10:03 AM  CMP  Glucose 70 - 99 mg/dL 811  914    BUN 8 - 23 mg/dL 28  32    Creatinine  7.82 - 1.00 mg/dL 9.56  2.13    Sodium 086 - 145 mmol/L 138  137  138   Potassium  3.5 - 5.1 mmol/L 3.9  4.0  4.1   Chloride 98 - 111 mmol/L 105  106    CO2 22 - 32 mmol/L 25  24    Calcium  8.9 - 10.3 mg/dL 8.6  8.8    Total Protein 6.5 - 8.1 g/dL 6.4     Total Bilirubin 0.0 - 1.2 mg/dL 0.5     Alkaline Phos 38 - 126 U/L 56     AST 15 - 41 U/L 17     ALT 0 - 44 U/L 16         RADIOGRAPHIC STUDIES: I have personally reviewed the radiological images as listed and agreed with the findings in the report. No results found.    Orders Placed This Encounter  Procedures   C difficile quick screen w PCR reflex    Standing Status:   Future    Expected Date:   02/08/2024    Expiration Date:   02/07/2025   All questions were answered. The patient knows to call the clinic with any problems, questions or concerns. No barriers to learning was detected. The total time spent in the appointment was 30 minutes, including review of chart and various tests results, discussions about plan of care and coordination of care plan     Sonja Bonney, MD 02/08/2024

## 2024-02-08 NOTE — Assessment & Plan Note (Signed)
 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  -PET Jan 25, 2024 showed long segment anorectal hypermetabolic activity, and hypermetabolic right inguinal and external iliac lymph nodes metastasis.  No distant metastasis. -she started concurrent chemoradiation with 5-FU and mitomycin  on Jan 31, 2024. -she was hospitalized for pericarditis on 02/02/2024

## 2024-02-08 NOTE — Progress Notes (Signed)
 Verbal order w/readback from Dr. Maryalice Smaller for Magic Mouthwash 1:1 Nystatin, diphenhydramine , lidocaine  viscous, and Maalox.  Swish & Swallow.  w/1refill.  Send prescription to [facility].  Prescription sent.

## 2024-02-09 ENCOUNTER — Ambulatory Visit
Admission: RE | Admit: 2024-02-09 | Discharge: 2024-02-09 | Disposition: A | Discharge status: Home / Self Care | Source: Ambulatory Visit | Attending: Radiation Oncology | Admitting: Radiation Oncology

## 2024-02-09 ENCOUNTER — Other Ambulatory Visit: Payer: Self-pay

## 2024-02-09 DIAGNOSIS — Z510 Encounter for antineoplastic radiation therapy: Secondary | ICD-10-CM | POA: Diagnosis not present

## 2024-02-09 LAB — RAD ONC ARIA SESSION SUMMARY
Course Elapsed Days: 9
Plan Fractions Treated to Date: 5
Plan Prescribed Dose Per Fraction: 1.8 Gy
Plan Total Fractions Prescribed: 30
Plan Total Prescribed Dose: 54 Gy
Reference Point Dosage Given to Date: 9 Gy
Reference Point Session Dosage Given: 1.8 Gy
Session Number: 5

## 2024-02-10 ENCOUNTER — Ambulatory Visit
Admission: RE | Admit: 2024-02-10 | Discharge: 2024-02-10 | Disposition: A | Discharge status: Home / Self Care | Source: Ambulatory Visit | Attending: Radiation Oncology | Admitting: Radiation Oncology

## 2024-02-10 ENCOUNTER — Other Ambulatory Visit: Payer: Self-pay

## 2024-02-10 DIAGNOSIS — Z510 Encounter for antineoplastic radiation therapy: Secondary | ICD-10-CM | POA: Diagnosis not present

## 2024-02-10 LAB — RAD ONC ARIA SESSION SUMMARY
Course Elapsed Days: 10
Plan Fractions Treated to Date: 6
Plan Prescribed Dose Per Fraction: 1.8 Gy
Plan Total Fractions Prescribed: 30
Plan Total Prescribed Dose: 54 Gy
Reference Point Dosage Given to Date: 10.8 Gy
Reference Point Session Dosage Given: 1.8 Gy
Session Number: 6

## 2024-02-11 ENCOUNTER — Ambulatory Visit
Admission: RE | Admit: 2024-02-11 | Discharge: 2024-02-11 | Disposition: A | Discharge status: Home / Self Care | Source: Ambulatory Visit | Attending: Radiation Oncology | Admitting: Radiation Oncology

## 2024-02-11 ENCOUNTER — Other Ambulatory Visit: Payer: Self-pay

## 2024-02-11 DIAGNOSIS — Z510 Encounter for antineoplastic radiation therapy: Secondary | ICD-10-CM | POA: Diagnosis not present

## 2024-02-11 LAB — RAD ONC ARIA SESSION SUMMARY
Course Elapsed Days: 11
Plan Fractions Treated to Date: 7
Plan Prescribed Dose Per Fraction: 1.8 Gy
Plan Total Fractions Prescribed: 30
Plan Total Prescribed Dose: 54 Gy
Reference Point Dosage Given to Date: 12.6 Gy
Reference Point Session Dosage Given: 1.8 Gy
Session Number: 7

## 2024-02-15 ENCOUNTER — Other Ambulatory Visit: Payer: Self-pay

## 2024-02-15 ENCOUNTER — Ambulatory Visit
Admission: RE | Admit: 2024-02-15 | Discharge: 2024-02-15 | Disposition: A | Discharge status: Home / Self Care | Source: Ambulatory Visit | Attending: Radiation Oncology | Admitting: Radiation Oncology

## 2024-02-15 ENCOUNTER — Other Ambulatory Visit: Payer: Self-pay | Admitting: Nurse Practitioner

## 2024-02-15 DIAGNOSIS — Z510 Encounter for antineoplastic radiation therapy: Secondary | ICD-10-CM | POA: Diagnosis not present

## 2024-02-15 DIAGNOSIS — C210 Malignant neoplasm of anus, unspecified: Secondary | ICD-10-CM

## 2024-02-15 LAB — RAD ONC ARIA SESSION SUMMARY
Course Elapsed Days: 15
Plan Fractions Treated to Date: 8
Plan Prescribed Dose Per Fraction: 1.8 Gy
Plan Total Fractions Prescribed: 30
Plan Total Prescribed Dose: 54 Gy
Reference Point Dosage Given to Date: 14.4 Gy
Reference Point Session Dosage Given: 1.8 Gy
Session Number: 8

## 2024-02-15 NOTE — Progress Notes (Signed)
 Patient Care Team: Alejandro Hurt, FNP as PCP - General (Family Medicine) Jacqueline Matsu, MD as PCP - Cardiology (Cardiology) Rhetta Cellar, RN as Oncology Nurse Navigator Sonja Oblong, MD as Consulting Physician (Hematology and Oncology)  Clinic Day:  02/20/2024  Referring physician: Alejandro Hurt, FNP  ASSESSMENT & PLAN:   Assessment & Plan: Anal squamous cell carcinoma (HCC) cT2N1M0, stage IIIA -previous history of superficially invasive squamous cell carcinoma of anus, status post surgical resection in August 2023, presented with rectal discomfort for several months, and a palpable 2 to 3 cm mass in the anal canal, biopsy confirmed invasive squamous cell carcinoma in April 2025.  -PET Jan 25, 2024 showed long segment anorectal hypermetabolic activity, and hypermetabolic right inguinal and external iliac lymph nodes metastasis.  No distant metastasis. -she started concurrent chemoradiation with 5-FU and mitomycin  on Jan 31, 2024. -she was hospitalized for 2 days due to pericarditis on 02/02/2024. It is felt that chest pain was more likely to be from coronary artery spasms from 5-FU.  -02/16/2024 - mild and stable anemia.  Managing daily radiation well with mild fatigue.  Continue as scheduled.    Recent chest pain This is felt likely to be from the administration of 5-FU.  She is scheduled to see Dr. Alease Amend, cardiology prior to next treatment with mitomycin  and 5-FU.  Second round of chemotherapy will have to be administered while in the hospital due to pericarditis from 5-FU.  Has been good since discharge from the hospital.  She did have episode of right lower extremity weakness yesterday.  States her leg felt like it was going to "crumble."  Much better today.  No redness or swelling present in the right foot, ankle, or lower extremity.  Plan: Labs reviewed. -CBC showing mild and stable anemia.  Hgb 10.8 and HCT 30.9. - Mildly low potassium at 3.4.  Remainder of CMP essentially  normal. -CEA slightly elevated at 10.62.  Will continue to monitor with age visit. Scheduled to see cardiology on 02/21/2024, prior to administration of chemotherapy with mitomycin  and 5-FU. Continue daily radiation. Follow-up with labs and next treatment as scheduled. The patient understands the plans discussed today and is in agreement with them.  She knows to contact our office if she develops concerns prior to her next appointment.  I provided 25 minutes of face-to-face time during this encounter and > 50% was spent counseling as documented under my assessment and plan.    [name], NP  [facility] - A DEPT OF [facility] [address] [place] [address] Dept: [phone] Dept Fax: [phone]   No orders of the defined types were placed in this encounter.     CHIEF COMPLAINT:  CC: f/u anal cancer   Current Treatment:  concurrent chemoradiation  INTERVAL HISTORY:  Andrea [name] is here today for repeat clinical assessment. She was last seen by Dr. Maryalice Smaller 02/08/2024. Had hospitalization on 02/02/2024 due to pericarditis. It is felt to be more likely that her chest pain was from 5-FU induced coronary artery spasms. She was to follow up with cardiology prior to next treatment with mitomycin  and 5-FU.Aaron Aas  Next treatment with chemotherapy will likely be done in hospital due to recent bout of pericarditis.  She has not had any further chest pain since her hospitalization.  Does have a few intermittent dizzy spells.  Attributes this to mild dehydration.  She had right lower extremity pain  yesterday. She currently denies chest pain, chest pressure, or shortness of breath. She denies headaches or visual disturbances. She denies abdominal pain, nausea, vomiting, or changes in bowel or bladder habits.  She denies fevers or chills. She denies pain. Her appetite is good. Her weight has decreased 6 pounds over last week.  I have  reviewed the past medical history, past surgical history, social history and family history with the patient and they are unchanged from previous note.  ALLERGIES:  is allergic to codeine.  MEDICATIONS:  Current Outpatient Medications  Medication Sig Dispense Refill   acetaminophen  (TYLENOL ) 500 MG tablet Take 1,000 mg by mouth every 6 (six) hours as needed for moderate pain.     albuterol  (VENTOLIN  HFA) 108 (90 Base) MCG/ACT inhaler Inhale 1-2 puffs into the lungs every 4 (four) hours as needed for wheezing or shortness of breath. 8 g 0   ammonium lactate (LAC-HYDRIN) 12 % lotion Apply 1 Application topically daily.     atorvastatin  (LIPITOR) 80 MG tablet TAKE ONE TABLET BY MOUTH DAILY (Patient taking differently: Take 80 mg by mouth daily. Take 1/2 tablet daily) 90 tablet 1   colchicine  0.6 MG tablet Take 1 tablet (0.6 mg total) by mouth 2 (two) times daily. 60 tablet 1   diphenoxylate -atropine  (LOMOTIL ) 2.5-0.025 MG tablet Take 1-2 tablets by mouth 4 (four) times daily as needed for diarrhea or loose stools. 60 tablet 0   ezetimibe  (ZETIA ) 10 MG tablet TAKE 1 TABLET BY MOUTH DAILY 90 tablet 3   fluticasone  (FLONASE ) 50 MCG/ACT nasal spray Place 2 sprays into both nostrils daily. (Patient taking differently: Place 2 sprays into both nostrils daily as needed for allergies.) 1 g 0   losartan  (COZAAR ) 25 MG tablet Take 25 mg by mouth daily.     magic mouthwash (nystatin , lidocaine , diphenhydrAMINE , alum & mag hydroxide) suspension Swish and swallow 5 mLs by mouth 3 (three) times daily as needed for mouth pain. 140 mL 1   ondansetron  (ZOFRAN ) 8 MG tablet Take 1 tablet (8 mg total) by mouth every 8 (eight) hours as needed for nausea or vomiting. 30 tablet 1   pantoprazole  (PROTONIX ) 40 MG tablet Take 1 tablet (40 mg total) by mouth 2 (two) times daily. 60 tablet 0   PARoxetine  (PAXIL ) 20 MG tablet Take 10 mg by mouth daily.     SUMAtriptan (IMITREX) 25 MG tablet Take 25 mg by mouth every 2 (two)  hours as needed for migraine.     vedolizumab  (ENTYVIO ) 300 MG injection Inject 300 mg into the vein every 8 (eight) weeks.     nitroGLYCERIN  (NITROSTAT ) 0.4 MG SL tablet Place 1 tablet (0.4 mg total) under the tongue every 5 (five) minutes as needed for chest pain. 25 tablet 2   triamterene -hydrochlorothiazide  (MAXZIDE -25) 37.5-25 MG tablet Take 1 tablet by mouth daily for 10 days. 10 tablet 0   No current facility-administered medications for this visit.    HISTORY OF PRESENT ILLNESS:   Oncology History  Anal squamous cell carcinoma (HCC)  12/29/2023 Pathology Results   A. PERIANAL, RIGHT POSTERIOR, BIOPSY:       Invasive squamous cell carcinoma, moderately differentiated. Suspicious for lymphovascular invasion.  B. PERIANAL, LEFT POSTERIOR, BIOPSY:       High-grade squamous intraepithelial lesion (HSIL / AIN 3).  No evidence of definitive invasion.    01/20/2024 Initial Diagnosis   Anal cancer (HCC)   01/26/2024 Cancer Staging   Staging form: Anus, AJCC 8th Edition - Clinical: Stage IIIA (cT2,  cN1c, cM0) - Signed by Sonja Mora, MD on 01/26/2024   01/31/2024 -  Chemotherapy   Patient is on Treatment Plan : ANUS Mitomycin  D1,28 + 5FU D1-4, 28-31 q32d         REVIEW OF SYSTEMS:   Constitutional: Denies fevers, chills or abnormal weight loss. Decreased appetite.  Eyes: Denies blurriness of vision Ears, nose, mouth, throat, and face: Denies mucositis or sore throat Respiratory: Denies cough, dyspnea or wheezes Cardiovascular: Denies palpitation, chest discomfort or lower extremity swelling Gastrointestinal:  Denies nausea, heartburn or change in bowel habits Skin: Denies abnormal skin rashes Lymphatics: Denies new lymphadenopathy or easy bruising Neurological:Denies numbness, tingling or new weaknesses Behavioral/Psych: Mood is stable, no new changes  All other systems were reviewed with the patient and are negative.   VITALS:   Today's Vitals   02/16/24 1030 02/16/24 1035  BP:  132/64   Pulse: 81   Resp: 17   Temp: 97.6 F (36.4 C)   SpO2: 96%   Weight: 167 lb 3.2 oz (75.8 kg)   PainSc:  0-No pain   Body mass index is 27.82 kg/m.   Wt Readings from Last 3 Encounters:  02/16/24 167 lb 3.2 oz (75.8 kg)  02/08/24 173 lb 12.8 oz (78.8 kg)  02/02/24 172 lb (78 kg)    Body mass index is 27.82 kg/m.  Performance status (ECOG): 1 - Symptomatic but completely ambulatory  PHYSICAL EXAM:   GENERAL:alert, no distress and comfortable SKIN: skin color, texture, turgor are normal, no rashes or significant lesions EYES: normal, Conjunctiva are pink and non-injected, sclera clear OROPHARYNX:no exudate, no erythema and lips, buccal mucosa, and tongue normal  NECK: supple, thyroid normal size, non-tender, without nodularity LYMPH:  no palpable lymphadenopathy in the cervical, axillary or inguinal LUNGS: clear to auscultation and percussion with normal breathing effort HEART: regular rate & rhythm and no murmurs and no lower extremity edema ABDOMEN:abdomen soft, non-tender and normal bowel sounds Musculoskeletal:no cyanosis of digits and no clubbing  NEURO: alert & oriented x 3 with fluent speech, no focal motor/sensory deficits  LABORATORY DATA:  I have reviewed the data as listed    Component Value Date/Time   NA 137 02/16/2024 0955   NA 140 01/15/2022 0907   K 3.4 (L) 02/16/2024 0955   CL 105 02/16/2024 0955   CO2 24 02/16/2024 0955   GLUCOSE 102 (H) 02/16/2024 0955   BUN 29 (H) 02/16/2024 0955   BUN 15 01/15/2022 0907   CREATININE 1.01 (H) 02/16/2024 0955   CALCIUM  9.6 02/16/2024 0955   PROT 7.1 02/16/2024 0955   PROT 6.8 10/10/2019 1030   ALBUMIN 3.7 02/16/2024 0955   ALBUMIN 4.4 10/10/2019 1030   AST 24 02/16/2024 0955   ALT 23 02/16/2024 0955   ALKPHOS 76 02/16/2024 0955   BILITOT 0.4 02/16/2024 0955   GFRNONAA >60 02/16/2024 0955   GFRAA 84 07/06/2019 1601   Lab Results  Component Value Date   WBC 1.7 (L) 02/16/2024   NEUTROABS 0.6 (L)  02/16/2024   HGB 10.8 (L) 02/16/2024   HCT 30.9 (L) 02/16/2024   MCV 82.8 02/16/2024   PLT 91 (L) 02/16/2024     RADIOGRAPHIC STUDIES: VAS US  LOWER EXTREMITY VENOUS (DVT) Result Date: 02/06/2024  Lower Venous DVT Study Patient Name:  RENNIE HACK  Date of Exam:   02/04/2024 Medical Rec #: [id number]        Accession #:    [id number] Date of Birth: [birth year]  Patient Gender: F Patient Age:   78 years Exam Location:  [facility] Procedure:      VAS US  LOWER EXTREMITY VENOUS (DVT) Referring Phys: Clifford Dam REGALADO --------------------------------------------------------------------------------  Indications: Pain, and right upper thigh.  Risk Factors: Cancer. Anticoagulation: Lovenox . Comparison Study: 10/27/23 Performing Technologist: Franky Ivanoff Sturdivant-Jones RDMS, RVT  Examination Guidelines: A complete evaluation includes B-mode imaging, spectral Doppler, color Doppler, and power Doppler as needed of all accessible portions of each vessel. Bilateral testing is considered an integral part of a complete examination. Limited examinations for reoccurring indications may be performed as noted. The reflux portion of the exam is performed with the patient in reverse Trendelenburg.  +---------+---------------+---------+-----------+----------+--------------+ RIGHT    CompressibilityPhasicitySpontaneityPropertiesThrombus Aging +---------+---------------+---------+-----------+----------+--------------+ CFV      Full           Yes                                          +---------+---------------+---------+-----------+----------+--------------+ SFJ      Full                                                        +---------+---------------+---------+-----------+----------+--------------+ FV Prox  Full                                                        +---------+---------------+---------+-----------+----------+--------------+ FV Mid   Full                                                         +---------+---------------+---------+-----------+----------+--------------+ FV DistalFull                                                        +---------+---------------+---------+-----------+----------+--------------+ PFV      Full                                                        +---------+---------------+---------+-----------+----------+--------------+ POP      Full           Yes                                          +---------+---------------+---------+-----------+----------+--------------+ PTV      Full                                                        +---------+---------------+---------+-----------+----------+--------------+  PERO     Full                                                        +---------+---------------+---------+-----------+----------+--------------+   +---------+---------------+---------+-----------+----------+--------------+ LEFT     CompressibilityPhasicitySpontaneityPropertiesThrombus Aging +---------+---------------+---------+-----------+----------+--------------+ CFV      Full           Yes      Yes                                 +---------+---------------+---------+-----------+----------+--------------+ SFJ      Full                                                        +---------+---------------+---------+-----------+----------+--------------+ FV Prox  Full                                                        +---------+---------------+---------+-----------+----------+--------------+ FV Mid   Full                                                        +---------+---------------+---------+-----------+----------+--------------+ FV DistalFull                                                        +---------+---------------+---------+-----------+----------+--------------+ PFV      Full                                                         +---------+---------------+---------+-----------+----------+--------------+ POP      Full           Yes      Yes                                 +---------+---------------+---------+-----------+----------+--------------+ PTV      Full                                                        +---------+---------------+---------+-----------+----------+--------------+ PERO     Full                                                        +---------+---------------+---------+-----------+----------+--------------+  Summary: BILATERAL: - No evidence of deep vein thrombosis seen in the lower extremities, bilaterally. -No evidence of popliteal cyst, bilaterally.   *See table(s) above for measurements and observations. Electronically signed by Runell Countryman on 02/06/2024 at 9:35:02 AM.    Final    EEG adult Result Date: 02/04/2024 Eleni Griffin, MD     02/04/2024  4:03 PM Routine EEG Report SKILAR MARCOU is a 68 y.o. female with a history of myoclonus who is undergoing an EEG to evaluate for seizures. Report: This EEG was acquired with electrodes placed according to the International 10-20 electrode system (including Fp1, Fp2, F3, F4, C3, C4, P3, P4, O1, O2, T3, T4, T5, T6, A1, A2, Fz, Cz, Pz). The following electrodes were missing or displaced: none. The occipital dominant rhythm was 5-7 Hz. This activity is reactive to stimulation. Drowsiness was manifested by background fragmentation; deeper stages of sleep were identified by K complexes and sleep spindles. There was no focal slowing. There were no interictal epileptiform discharges. There were no electrographic seizures identified. There was no abnormal response to photic stimulation or hyperventilation. Impression and clinical correlation: This EEG was obtained while awake and asleep and is abnormal due to mild-to-moderate diffuse slowing indicative of global cerebral dysfunction. Epileptiform abnormalities were not seen during this recording.  Greg Leaks, MD [facility] [phone] If 7pm- 7am, please page neurology on call as listed in AMION.   CARDIAC CATHETERIZATION Result Date: 02/03/2024   Prox LAD lesion is 20% stenosed.   Prox RCA lesion is 20% stenosed. 1.  Mild nonobstructive coronary artery disease. 2.  Left ventricular angiography was not performed.  EF was normal by echo.  Moderately elevated left ventricular end-diastolic pressure at 26 mmHg. Recommendations: No culprit is identified for chest pain and elevated troponin.  Suspect myopericarditis based on symptoms and EKG changes. Colchicine  0.6 mg twice daily was added. I discontinued heparin  drip.   ECHOCARDIOGRAM COMPLETE Result Date: 02/03/2024    ECHOCARDIOGRAM REPORT   Patient Name:   ROSARIO DUEY Date of Exam: 02/03/2024 Medical Rec #:  [id number]       Height:       65.0 in Accession #:    [id number]      Weight:       172.0 lb Date of Birth:  [birth year]        BSA:          1.855 m Patient Age:    68 years        BP:           110/63 mmHg Patient Gender: F               HR:           70 bpm. Exam Location:  Inpatient Procedure: 2D Echo, Color Doppler, Cardiac Doppler and Intracardiac            Opacification Agent (Both Spectral and Color Flow Doppler were            utilized during procedure). Indications:    Chest Pain  History:        Patient has prior history of Echocardiogram examinations.                 Signs/Symptoms:Dyspnea; Risk Factors:Hypertension.  Sonographer:    Willey Harrier Referring Phys: [id number] ZANE ADAMS  Sonographer Comments: Technically difficult study due to poor echo windows. IMPRESSIONS  1. Left ventricular ejection fraction, by estimation, is 55 to 60%. The left ventricle  has normal function. The left ventricle has no regional wall motion abnormalities. Left ventricular diastolic parameters were normal.  2. Right ventricular systolic function is normal. The right ventricular size is normal.  3. The mitral valve is degenerative. Trivial  mitral valve regurgitation. No evidence of mitral stenosis.  4. The aortic valve is tricuspid. Aortic valve regurgitation is not visualized. Aortic valve sclerosis/calcification is present, without any evidence of aortic stenosis. Aortic valve Vmax measures 1.35 m/s.  5. The inferior vena cava is normal in size with greater than 50% respiratory variability, suggesting right atrial pressure of 3 mmHg. FINDINGS  Left Ventricle: Left ventricular ejection fraction, by estimation, is 55 to 60%. The left ventricle has normal function. The left ventricle has no regional wall motion abnormalities. Definity  contrast agent was given IV to delineate the left ventricular  endocardial borders. The left ventricular internal cavity size was normal in size. There is no left ventricular hypertrophy. Left ventricular diastolic parameters were normal. Normal left ventricular filling pressure. Right Ventricle: The right ventricular size is normal. No increase in right ventricular wall thickness. Right ventricular systolic function is normal. Left Atrium: Left atrial size was normal in size. Right Atrium: Right atrial size was normal in size. Pericardium: There is no evidence of pericardial effusion. Mitral Valve: The mitral valve is degenerative in appearance. There is mild calcification of the mitral valve leaflet(s). Mild mitral annular calcification. Trivial mitral valve regurgitation. No evidence of mitral valve stenosis. MV peak gradient, 4.3 mmHg. The mean mitral valve gradient is 2.0 mmHg. Tricuspid Valve: The tricuspid valve is normal in structure. Tricuspid valve regurgitation is not demonstrated. No evidence of tricuspid stenosis. Aortic Valve: The aortic valve is tricuspid. Aortic valve regurgitation is not visualized. Aortic valve sclerosis/calcification is present, without any evidence of aortic stenosis. Aortic valve peak gradient measures 7.3 mmHg. Pulmonic Valve: The pulmonic valve was normal in structure. Pulmonic  valve regurgitation is not visualized. No evidence of pulmonic stenosis. Aorta: The aortic root is normal in size and structure. Venous: The inferior vena cava is normal in size with greater than 50% respiratory variability, suggesting right atrial pressure of 3 mmHg. IAS/Shunts: The interatrial septum appears to be lipomatous. No atrial level shunt detected by color flow Doppler.  LEFT VENTRICLE PLAX 2D LVIDd:         4.20 cm   Diastology LVIDs:         3.20 cm   LV e' medial:    8.27 cm/s LV PW:         0.80 cm   LV E/e' medial:  9.1 LV IVS:        0.90 cm   LV e' lateral:   7.29 cm/s LVOT diam:     2.00 cm   LV E/e' lateral: 10.3 LV SV:         64 LV SV Index:   35 LVOT Area:     3.14 cm  RIGHT VENTRICLE            IVC RV S prime:     9.25 cm/s  IVC diam: 2.50 cm TAPSE (M-mode): 2.0 cm LEFT ATRIUM             Index        RIGHT ATRIUM          Index LA Vol (A2C):   37.0 ml 19.94 ml/m  RA Area:     9.78 cm LA Vol (A4C):   26.9 ml 14.50 ml/m  RA Volume:  20.70 ml 11.16 ml/m LA Biplane Vol: 32.0 ml 17.25 ml/m  AORTIC VALVE AV Area (Vmax): 1.96 cm AV Vmax:        135.00 cm/s AV Peak Grad:   7.3 mmHg LVOT Vmax:      84.20 cm/s LVOT Vmean:     63.800 cm/s LVOT VTI:       0.204 m  AORTA Ao Root diam: 2.60 cm Ao Asc diam:  2.80 cm MITRAL VALVE MV Area (PHT): 3.05 cm    SHUNTS MV Area VTI:   2.38 cm    Systemic VTI:  0.20 m MV Peak grad:  4.3 mmHg    Systemic Diam: 2.00 cm MV Mean grad:  2.0 mmHg MV Vmax:       1.04 m/s MV Vmean:      59.2 cm/s MV Decel Time: 249 msec MV E velocity: 75.20 cm/s MV A velocity: 95.30 cm/s MV E/A ratio:  0.79 Gaylyn Keas MD Electronically signed by Gaylyn Keas MD Signature Date/Time: 02/03/2024/1:28:23 PM    Final    DG Chest Port 1 View Result Date: 02/02/2024 CLINICAL DATA:  Status post PICC line placement. EXAM: PORTABLE CHEST 1 VIEW COMPARISON:  June 20, 2023 FINDINGS: A right-sided PICC line is seen with its distal tip approximately 1.8 cm distal to the junction of the  superior vena cava and right atrium. The heart size and mediastinal contours are within normal limits. There is moderate severity calcification of the aortic arch. Stable mild to moderate severity biapical scarring and/or atelectasis is seen. No pleural effusion or pneumothorax is identified. The visualized skeletal structures are unremarkable. IMPRESSION: Right-sided PICC line positioning, as described above. Electronically Signed   By: Virgle Grime M.D.   On: 02/02/2024 23:24   CT Angio Chest PE W and/or Wo Contrast Result Date: 02/02/2024 CLINICAL DATA:  Chest pain. EXAM: CT ANGIOGRAPHY CHEST WITH CONTRAST TECHNIQUE: Multidetector CT imaging of the chest was performed using the standard protocol during bolus administration of intravenous contrast. Multiplanar CT image reconstructions and MIPs were obtained to evaluate the vascular anatomy. RADIATION DOSE REDUCTION: This exam was performed according to the departmental dose-optimization program which includes automated exposure control, adjustment of the mA and/or kV according to patient size and/or use of iterative reconstruction technique. CONTRAST:  65mL OMNIPAQUE  IOHEXOL  350 MG/ML SOLN COMPARISON:  Jan 25, 2024. FINDINGS: Cardiovascular: Satisfactory opacification of the pulmonary arteries to the segmental level. No evidence of pulmonary embolism. Normal heart size. No pericardial effusion. Mediastinum/Nodes: No enlarged mediastinal, hilar, or axillary lymph nodes. Thyroid gland, trachea, and esophagus demonstrate no significant findings. Lungs/Pleura: No pneumothorax or pleural effusion is noted. Mild biapical scarring is noted. Upper Abdomen: No acute abnormality. Musculoskeletal: No chest wall abnormality. No acute or significant osseous findings. Review of the MIP images confirms the above findings. IMPRESSION: No definite evidence of pulmonary embolus. Electronically Signed   By: Rosalene Colon M.D.   On: 02/02/2024 13:14   IR PICC PLACEMENT  RIGHT <5 YRS INC IMG GUIDE Result Date: 01/31/2024 INDICATION: Patient with history of anal cancer; central venous access requested for chemotherapy EXAM: RIGHT UPPER EXTREMITY PICC LINE PLACEMENT WITH ULTRASOUND AND FLUOROSCOPIC GUIDANCE MEDICATIONS: 4 mL 1% lidocaine  to skin and subcutaneous tissue ANESTHESIA/SEDATION: None FLUOROSCOPY: Radiation Exposure Index (as provided by the fluoroscopic device): 1 mGy Kerma COMPLICATIONS: None immediate. PROCEDURE: The patient was advised of the possible risks and complications and agreed to undergo the procedure. The patient was then brought to the angiographic suite for the procedure.  The right arm was prepped with chlorhexidine , draped in the usual sterile fashion using maximum barrier technique (cap and mask, sterile gown, sterile gloves, large sterile sheet, hand hygiene and cutaneous antisepsis) and infiltrated locally with 1% Lidocaine . Ultrasound demonstrated patency of the right basilic vein, and this was documented with an image. Under real-time ultrasound guidance, this vein was accessed with a 21 gauge micropuncture needle and image documentation was performed. A 0.018 wire was introduced in to the vein. Over this, a 5 [place] dual lumen power injectable PICC was advanced to the lower SVC/right atrial junction. Fluoroscopy during the procedure and fluoro spot radiograph confirms appropriate catheter position. The catheter was flushed and covered with a sterile dressing. Catheter length: 40 cm IMPRESSION: Successful RIGHT arm power PICC line placement with ultrasound and fluoroscopic guidance. The tip of the catheter is positioned at the superior cavo-atrial junction. the catheter is ready for use. Performed by: Wash Hack Electronically Signed   By: Art Largo M.D.   On: 01/31/2024 11:53   NM PET Image Initial (PI) Skull Base To Thigh Result Date: 01/25/2024 CLINICAL DATA:  Initial treatment strategy for anal carcinoma. History of ulcerative colitis.  EXAM: NUCLEAR MEDICINE PET SKULL BASE TO THIGH TECHNIQUE: 8.6 mCi F-18 FDG was injected intravenously. Full-ring PET imaging was performed from the skull base to thigh after the radiotracer. CT data was obtained and used for attenuation correction and anatomic localization. Fasting blood glucose: 89 mg/dl COMPARISON:  Limited correlation made with cardiac CT 08/09/2019. FINDINGS: Mediastinal blood pool activity: SUV max 3.1 NECK: No hypermetabolic cervical lymph nodes are identified.Fairly symmetric activity within the lymphoid tissue of Waldeyer's ring is within physiologic limits. No suspicious activity identified within the pharyngeal mucosal space. Incidental CT findings: Mild left carotid atherosclerosis. CHEST: There are no hypermetabolic mediastinal, hilar or axillary lymph nodes. No hypermetabolic pulmonary activity or suspicious nodularity. Incidental CT findings: Biapical scarring noted with pleural calcifications along the superior aspect of the left major fissure. Mild central airway thickening. Atherosclerosis of the aorta, great vessels and coronary arteries noted. ABDOMEN/PELVIS: There is no hypermetabolic activity within the liver, adrenal glands, spleen or pancreas. Long segment anorectal hypermetabolic activity extends approximately 4 cm in length and has an SUV max of 9.0. No other hypermetabolic activity identified within the bowel. There are hypermetabolic right inguinal and external iliac lymph nodes. The largest node in the right groin measures 1.7 cm short axis on image 168/4 and has an SUV max of 12.8. More proximal node measuring 1.2 cm on image 164/4 has an SUV max of 8.0. There is a right external iliac node measuring 9 mm short axis on image 156/4 which has an SUV max of 9.1. No definite hypermetabolic abdominal, left pelvic or left inguinal lymph nodes. Incidental CT findings: Aortic and branch vessel atherosclerosis. SKELETON: There is no hypermetabolic activity to suggest osseous  metastatic disease. Incidental CT findings: Status post bilateral total hip arthroplasty with associated susceptibility artifact. Mild lumbar spondylosis. Incidental sacral Tarlov cyst noted. IMPRESSION: 1. Long segment anorectal hypermetabolic activity consistent with known primary malignancy. 2. Hypermetabolic right inguinal and external iliac lymph nodes consistent with nodal metastases. 3. No evidence of distant metastatic disease. 4.  Aortic Atherosclerosis (ICD10-I70.0). Electronically Signed   By: Elmon Hagedorn M.D.   On: 01/25/2024 14:39

## 2024-02-15 NOTE — Assessment & Plan Note (Addendum)
 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  -PET Jan 25, 2024 showed long segment anorectal hypermetabolic activity, and hypermetabolic right inguinal and external iliac lymph nodes metastasis.  No distant metastasis. -she started concurrent chemoradiation with 5-FU and mitomycin  on Jan 31, 2024. -she was hospitalized for 2 days due to pericarditis on 02/02/2024. It is felt that chest pain was more likely to be from coronary artery spasms from 5-FU.  -02/16/2024 - mild and stable anemia.  Managing daily radiation well with mild fatigue.  Continue as scheduled.

## 2024-02-16 ENCOUNTER — Other Ambulatory Visit: Payer: Self-pay

## 2024-02-16 ENCOUNTER — Ambulatory Visit
Admission: RE | Admit: 2024-02-16 | Discharge: 2024-02-16 | Disposition: A | Discharge status: Home / Self Care | Source: Ambulatory Visit | Attending: Radiation Oncology | Admitting: Radiation Oncology

## 2024-02-16 ENCOUNTER — Inpatient Hospital Stay

## 2024-02-16 ENCOUNTER — Inpatient Hospital Stay (HOSPITAL_BASED_OUTPATIENT_CLINIC_OR_DEPARTMENT_OTHER): Admitting: Nurse Practitioner

## 2024-02-16 VITALS — BP 132/64 | HR 81 | Temp 97.6°F | Resp 17 | Wt 167.2 lb

## 2024-02-16 DIAGNOSIS — C210 Malignant neoplasm of anus, unspecified: Secondary | ICD-10-CM | POA: Diagnosis not present

## 2024-02-16 DIAGNOSIS — Z510 Encounter for antineoplastic radiation therapy: Secondary | ICD-10-CM | POA: Diagnosis not present

## 2024-02-16 LAB — CBC WITH DIFFERENTIAL (CANCER CENTER ONLY)
Abs Immature Granulocytes: 0.01 10*3/uL (ref 0.00–0.07)
Basophils Absolute: 0 10*3/uL (ref 0.0–0.1)
Basophils Relative: 1 %
Eosinophils Absolute: 0.1 10*3/uL (ref 0.0–0.5)
Eosinophils Relative: 5 %
HCT: 30.9 % — ABNORMAL LOW (ref 36.0–46.0)
Hemoglobin: 10.8 g/dL — ABNORMAL LOW (ref 12.0–15.0)
Immature Granulocytes: 1 %
Lymphocytes Relative: 31 %
Lymphs Abs: 0.5 10*3/uL — ABNORMAL LOW (ref 0.7–4.0)
MCH: 29 pg (ref 26.0–34.0)
MCHC: 35 g/dL (ref 30.0–36.0)
MCV: 82.8 fL (ref 80.0–100.0)
Monocytes Absolute: 0.5 10*3/uL (ref 0.1–1.0)
Monocytes Relative: 30 %
Neutro Abs: 0.6 10*3/uL — ABNORMAL LOW (ref 1.7–7.7)
Neutrophils Relative %: 32 %
Platelet Count: 91 10*3/uL — ABNORMAL LOW (ref 150–400)
RBC: 3.73 MIL/uL — ABNORMAL LOW (ref 3.87–5.11)
RDW: 12.7 % (ref 11.5–15.5)
WBC Count: 1.7 10*3/uL — ABNORMAL LOW (ref 4.0–10.5)
nRBC: 0 % (ref 0.0–0.2)

## 2024-02-16 LAB — CMP (CANCER CENTER ONLY)
ALT: 23 U/L (ref 0–44)
AST: 24 U/L (ref 15–41)
Albumin: 3.7 g/dL (ref 3.5–5.0)
Alkaline Phosphatase: 76 U/L (ref 38–126)
Anion gap: 8 (ref 5–15)
BUN: 29 mg/dL — ABNORMAL HIGH (ref 8–23)
CO2: 24 mmol/L (ref 22–32)
Calcium: 9.6 mg/dL (ref 8.9–10.3)
Chloride: 105 mmol/L (ref 98–111)
Creatinine: 1.01 mg/dL — ABNORMAL HIGH (ref 0.44–1.00)
GFR, Estimated: >60 mL/min (ref >60)
Glucose, Bld: 102 mg/dL — ABNORMAL HIGH (ref 70–99)
Potassium: 3.4 mmol/L — ABNORMAL LOW (ref 3.5–5.1)
Sodium: 137 mmol/L (ref 135–145)
Total Bilirubin: 0.4 mg/dL (ref 0.0–1.2)
Total Protein: 7.1 g/dL (ref 6.5–8.1)

## 2024-02-16 LAB — RAD ONC ARIA SESSION SUMMARY
Course Elapsed Days: 16
Plan Fractions Treated to Date: 9
Plan Prescribed Dose Per Fraction: 1.8 Gy
Plan Total Fractions Prescribed: 30
Plan Total Prescribed Dose: 54 Gy
Reference Point Dosage Given to Date: 16.2 Gy
Reference Point Session Dosage Given: 1.8 Gy
Session Number: 9

## 2024-02-16 LAB — CEA (ACCESS): CEA (CHCC): 10.62 ng/mL — ABNORMAL HIGH (ref 0.00–5.00)

## 2024-02-17 ENCOUNTER — Other Ambulatory Visit: Payer: Self-pay

## 2024-02-17 ENCOUNTER — Ambulatory Visit
Admission: RE | Admit: 2024-02-17 | Discharge: 2024-02-17 | Disposition: A | Discharge status: Home / Self Care | Source: Ambulatory Visit | Attending: Radiation Oncology | Admitting: Radiation Oncology

## 2024-02-17 DIAGNOSIS — Z510 Encounter for antineoplastic radiation therapy: Secondary | ICD-10-CM | POA: Diagnosis not present

## 2024-02-17 LAB — RAD ONC ARIA SESSION SUMMARY
Course Elapsed Days: 17
Plan Fractions Treated to Date: 10
Plan Prescribed Dose Per Fraction: 1.8 Gy
Plan Total Fractions Prescribed: 30
Plan Total Prescribed Dose: 54 Gy
Reference Point Dosage Given to Date: 18 Gy
Reference Point Session Dosage Given: 1.8 Gy
Session Number: 10

## 2024-02-18 ENCOUNTER — Other Ambulatory Visit: Payer: Self-pay

## 2024-02-18 ENCOUNTER — Ambulatory Visit
Admission: RE | Admit: 2024-02-18 | Discharge: 2024-02-18 | Disposition: A | Discharge status: Home / Self Care | Source: Ambulatory Visit | Attending: Radiation Oncology | Admitting: Radiation Oncology

## 2024-02-18 DIAGNOSIS — Z510 Encounter for antineoplastic radiation therapy: Secondary | ICD-10-CM | POA: Diagnosis not present

## 2024-02-18 LAB — RAD ONC ARIA SESSION SUMMARY
Course Elapsed Days: 18
Plan Fractions Treated to Date: 11
Plan Prescribed Dose Per Fraction: 1.8 Gy
Plan Total Fractions Prescribed: 30
Plan Total Prescribed Dose: 54 Gy
Reference Point Dosage Given to Date: 19.8 Gy
Reference Point Session Dosage Given: 1.8 Gy
Session Number: 11

## 2024-02-20 ENCOUNTER — Encounter: Payer: Self-pay | Admitting: Hematology

## 2024-02-20 ENCOUNTER — Encounter: Payer: Self-pay | Admitting: Nurse Practitioner

## 2024-02-20 ENCOUNTER — Encounter: Payer: Self-pay | Admitting: Gastroenterology

## 2024-02-21 ENCOUNTER — Other Ambulatory Visit: Payer: Self-pay

## 2024-02-21 ENCOUNTER — Telehealth (HOSPITAL_COMMUNITY): Payer: Self-pay | Admitting: Cardiology

## 2024-02-21 ENCOUNTER — Telehealth: Payer: Self-pay | Admitting: Cardiology

## 2024-02-21 ENCOUNTER — Other Ambulatory Visit: Payer: Self-pay | Admitting: Hematology

## 2024-02-21 ENCOUNTER — Ambulatory Visit
Admission: RE | Admit: 2024-02-21 | Discharge: 2024-02-21 | Disposition: A | Discharge status: Home / Self Care | Source: Ambulatory Visit | Attending: Radiation Oncology | Admitting: Radiation Oncology

## 2024-02-21 DIAGNOSIS — Z79899 Other long term (current) drug therapy: Secondary | ICD-10-CM | POA: Insufficient documentation

## 2024-02-21 DIAGNOSIS — K1379 Other lesions of oral mucosa: Secondary | ICD-10-CM | POA: Diagnosis not present

## 2024-02-21 DIAGNOSIS — C210 Malignant neoplasm of anus, unspecified: Secondary | ICD-10-CM | POA: Diagnosis present

## 2024-02-21 DIAGNOSIS — Z510 Encounter for antineoplastic radiation therapy: Secondary | ICD-10-CM | POA: Insufficient documentation

## 2024-02-21 DIAGNOSIS — R0789 Other chest pain: Secondary | ICD-10-CM | POA: Diagnosis not present

## 2024-02-21 DIAGNOSIS — I739 Peripheral vascular disease, unspecified: Secondary | ICD-10-CM | POA: Insufficient documentation

## 2024-02-21 DIAGNOSIS — E876 Hypokalemia: Secondary | ICD-10-CM | POA: Insufficient documentation

## 2024-02-21 DIAGNOSIS — K219 Gastro-esophageal reflux disease without esophagitis: Secondary | ICD-10-CM | POA: Insufficient documentation

## 2024-02-21 DIAGNOSIS — R197 Diarrhea, unspecified: Secondary | ICD-10-CM | POA: Insufficient documentation

## 2024-02-21 DIAGNOSIS — D61818 Other pancytopenia: Secondary | ICD-10-CM | POA: Diagnosis not present

## 2024-02-21 LAB — RAD ONC ARIA SESSION SUMMARY
Course Elapsed Days: 21
Plan Fractions Treated to Date: 12
Plan Prescribed Dose Per Fraction: 1.8 Gy
Plan Total Fractions Prescribed: 30
Plan Total Prescribed Dose: 54 Gy
Reference Point Dosage Given to Date: 21.6 Gy
Reference Point Session Dosage Given: 1.8 Gy
Session Number: 12

## 2024-02-21 NOTE — Telephone Encounter (Signed)
 Spoke with Pt. Pt did not understand why she had a post cath appointment as she had never had a heart cath. After spending some time explaining the cath and going over things with her she stated she did in fact have a heart cath. She will keep her appointment for tomorrow.

## 2024-02-21 NOTE — Telephone Encounter (Signed)
-----   Message from Lauralee Poll sent at 02/09/2024  3:49 PM EDT ----- That sounds reasonable to me, we should be able to her seen by me in the next two weeks prior to hospitalization. Dr. Amanda Jungling and I discussed the protocol, that should work well.  Can we get her in with me either next week or the first week of June?  Ben ----- Message ----- From: Sonja Arion, MD Sent: 02/08/2024   4:08 PM EDT To: Jacqueline Matsu, MD; Lauralee Poll, MD  Dr. Alease Amend and Micael Adas,  This pt started concurrent chemo and radiation last week for anal cancer.  She developed significant chest pain on day 2 of the 5-FU infusion, and was admitted to hospital last week.  She was diagnosed with pericarditis, but I suspect that she had 5-FU related coronary artery spasm.  She was seen by Dr. Micael Adas before. Could one of you see her back for evaluation?  I plan to admit her to hospital for second round 5-FU infusion, which will be 6/9 (and [place] will be [place] of [place] that week). We have cardiac protocol for 5-fu related coronary artery spasm.  Please let me know if you think that is the case, and we need to coordinate her care when she is in hospital.  Thanks much  Gracie Lav

## 2024-02-21 NOTE — Assessment & Plan Note (Signed)
 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  -PET Jan 25, 2024 showed long segment anorectal hypermetabolic activity, and hypermetabolic right inguinal and external iliac lymph nodes metastasis.  No distant metastasis. -she started concurrent chemoradiation with 5-FU and mitomycin  on Jan 31, 2024. -she was hospitalized for pericarditis on 02/02/2024, clinically suspicious for 5-fu induced coronary artery spasm

## 2024-02-21 NOTE — Telephone Encounter (Signed)
 Pt is requesting a callback regarding her wanting to know the reason for her seeing NP tomorrow and then Arta Lark as well. Please advise

## 2024-02-21 NOTE — Telephone Encounter (Signed)
 Appt 6/3 @ 240 per Emer RN

## 2024-02-22 ENCOUNTER — Ambulatory Visit (INDEPENDENT_AMBULATORY_CARE_PROVIDER_SITE_OTHER): Admitting: Physician Assistant

## 2024-02-22 ENCOUNTER — Telehealth: Payer: Self-pay

## 2024-02-22 ENCOUNTER — Other Ambulatory Visit: Payer: Self-pay

## 2024-02-22 ENCOUNTER — Ambulatory Visit (HOSPITAL_BASED_OUTPATIENT_CLINIC_OR_DEPARTMENT_OTHER)
Admission: RE | Admit: 2024-02-22 | Discharge: 2024-02-22 | Disposition: A | Discharge status: Home / Self Care | Source: Ambulatory Visit | Attending: Cardiology | Admitting: Cardiology

## 2024-02-22 ENCOUNTER — Inpatient Hospital Stay (HOSPITAL_BASED_OUTPATIENT_CLINIC_OR_DEPARTMENT_OTHER): Admitting: Hematology

## 2024-02-22 ENCOUNTER — Encounter (HOSPITAL_COMMUNITY): Payer: Self-pay

## 2024-02-22 ENCOUNTER — Encounter: Payer: Self-pay | Admitting: Physician Assistant

## 2024-02-22 ENCOUNTER — Ambulatory Visit (INDEPENDENT_AMBULATORY_CARE_PROVIDER_SITE_OTHER): Admitting: Emergency Medicine

## 2024-02-22 ENCOUNTER — Ambulatory Visit
Admission: RE | Admit: 2024-02-22 | Discharge: 2024-02-22 | Disposition: A | Discharge status: Home / Self Care | Source: Ambulatory Visit | Attending: Radiation Oncology

## 2024-02-22 ENCOUNTER — Inpatient Hospital Stay: Attending: Nurse Practitioner

## 2024-02-22 VITALS — BP 104/66 | HR 90 | Wt 168.0 lb

## 2024-02-22 VITALS — BP 130/66 | HR 82 | Temp 98.2°F | Resp 17 | Ht 65.0 in | Wt 169.6 lb

## 2024-02-22 DIAGNOSIS — I739 Peripheral vascular disease, unspecified: Secondary | ICD-10-CM | POA: Insufficient documentation

## 2024-02-22 DIAGNOSIS — K219 Gastro-esophageal reflux disease without esophagitis: Secondary | ICD-10-CM | POA: Insufficient documentation

## 2024-02-22 DIAGNOSIS — R0789 Other chest pain: Secondary | ICD-10-CM | POA: Diagnosis not present

## 2024-02-22 DIAGNOSIS — K1379 Other lesions of oral mucosa: Secondary | ICD-10-CM | POA: Insufficient documentation

## 2024-02-22 DIAGNOSIS — I509 Heart failure, unspecified: Secondary | ICD-10-CM | POA: Diagnosis not present

## 2024-02-22 DIAGNOSIS — R197 Diarrhea, unspecified: Secondary | ICD-10-CM | POA: Insufficient documentation

## 2024-02-22 DIAGNOSIS — C210 Malignant neoplasm of anus, unspecified: Secondary | ICD-10-CM | POA: Diagnosis not present

## 2024-02-22 DIAGNOSIS — Z79899 Other long term (current) drug therapy: Secondary | ICD-10-CM | POA: Insufficient documentation

## 2024-02-22 DIAGNOSIS — Z510 Encounter for antineoplastic radiation therapy: Secondary | ICD-10-CM | POA: Diagnosis not present

## 2024-02-22 DIAGNOSIS — I2510 Atherosclerotic heart disease of native coronary artery without angina pectoris: Secondary | ICD-10-CM

## 2024-02-22 DIAGNOSIS — E876 Hypokalemia: Secondary | ICD-10-CM | POA: Insufficient documentation

## 2024-02-22 DIAGNOSIS — M1711 Unilateral primary osteoarthritis, right knee: Secondary | ICD-10-CM | POA: Diagnosis not present

## 2024-02-22 DIAGNOSIS — D61818 Other pancytopenia: Secondary | ICD-10-CM | POA: Insufficient documentation

## 2024-02-22 LAB — CBC WITH DIFFERENTIAL (CANCER CENTER ONLY)
Abs Immature Granulocytes: 0.02 10*3/uL (ref 0.00–0.07)
Basophils Absolute: 0 10*3/uL (ref 0.0–0.1)
Basophils Relative: 0 %
Eosinophils Absolute: 0.1 10*3/uL (ref 0.0–0.5)
Eosinophils Relative: 3 %
HCT: 30.6 % — ABNORMAL LOW (ref 36.0–46.0)
Hemoglobin: 10.7 g/dL — ABNORMAL LOW (ref 12.0–15.0)
Immature Granulocytes: 0 %
Lymphocytes Relative: 9 %
Lymphs Abs: 0.4 10*3/uL — ABNORMAL LOW (ref 0.7–4.0)
MCH: 28.7 pg (ref 26.0–34.0)
MCHC: 35 g/dL (ref 30.0–36.0)
MCV: 82 fL (ref 80.0–100.0)
Monocytes Absolute: 0.6 10*3/uL (ref 0.1–1.0)
Monocytes Relative: 13 %
Neutro Abs: 3.4 10*3/uL (ref 1.7–7.7)
Neutrophils Relative %: 75 %
Platelet Count: 244 10*3/uL (ref 150–400)
RBC: 3.73 MIL/uL — ABNORMAL LOW (ref 3.87–5.11)
RDW: 13.3 % (ref 11.5–15.5)
WBC Count: 4.5 10*3/uL (ref 4.0–10.5)
nRBC: 0 % (ref 0.0–0.2)

## 2024-02-22 LAB — RAD ONC ARIA SESSION SUMMARY
Course Elapsed Days: 22
Plan Fractions Treated to Date: 13
Plan Prescribed Dose Per Fraction: 1.8 Gy
Plan Total Fractions Prescribed: 30
Plan Total Prescribed Dose: 54 Gy
Reference Point Dosage Given to Date: 23.4 Gy
Reference Point Session Dosage Given: 1.8 Gy
Session Number: 13

## 2024-02-22 LAB — CMP (CANCER CENTER ONLY)
ALT: 20 U/L (ref 0–44)
AST: 21 U/L (ref 15–41)
Albumin: 4 g/dL (ref 3.5–5.0)
Alkaline Phosphatase: 93 U/L (ref 38–126)
Anion gap: 7 (ref 5–15)
BUN: 23 mg/dL (ref 8–23)
CO2: 25 mmol/L (ref 22–32)
Calcium: 9.5 mg/dL (ref 8.9–10.3)
Chloride: 105 mmol/L (ref 98–111)
Creatinine: 0.99 mg/dL (ref 0.44–1.00)
GFR, Estimated: >60 mL/min (ref >60)
Glucose, Bld: 109 mg/dL — ABNORMAL HIGH (ref 70–99)
Potassium: 3.9 mmol/L (ref 3.5–5.1)
Sodium: 137 mmol/L (ref 135–145)
Total Bilirubin: 0.4 mg/dL (ref 0.0–1.2)
Total Protein: 7 g/dL (ref 6.5–8.1)

## 2024-02-22 LAB — CEA (ACCESS): CEA (CHCC): 7.29 ng/mL — ABNORMAL HIGH (ref 0.00–5.00)

## 2024-02-22 MED ORDER — LIDOCAINE HCL 1 % IJ SOLN
2.0000 mL | INTRAMUSCULAR | Status: AC | PRN
Start: 2024-02-22 — End: 2024-02-22
  Administered 2024-02-22: 2 mL

## 2024-02-22 MED ORDER — METHYLPREDNISOLONE ACETATE 40 MG/ML IJ SUSP
40.0000 mg | INTRAMUSCULAR | Status: AC | PRN
  Administered 2024-02-22: 40 mg via INTRA_ARTICULAR

## 2024-02-22 MED ORDER — ISOSORBIDE MONONITRATE ER 30 MG PO TB24: 30.0000 mg | ORAL_TABLET | Freq: Two times a day (BID) | ORAL | 3 refills | Status: DC

## 2024-02-22 MED ORDER — BUPIVACAINE HCL 0.25 % IJ SOLN
2.0000 mL | INTRAMUSCULAR | Status: AC | PRN
  Administered 2024-02-22: 2 mL via INTRA_ARTICULAR

## 2024-02-22 NOTE — Progress Notes (Unsigned)
 Cardiology Office Note:    Date:  02/22/2024  ID:  Andrea [name], DOB 05-24-1956, MRN [id number] PCP: Alejandro Hurt, FNP  [facility] Providers Cardiologist:  Gaylyn Keas, MD { Click to update primary MD,subspecialty MD or APP then REFRESH:1}    {Click to Open Review  :1}   Patient Profile:       Chief Complaint: *** History of Present Illness:  Andrea [name] is a 68 y.o. female with visit-pertinent history of coronary artery disease, hypertension, hyperlipidemia, ulcerative colitis, PAD, anal cancer currently on chemotherapy  Patient establish care with Dr. Micael Adas in 2020 for chest pain.  Coronary CTA was done at that time showing a coronary calcium  score of 190 (89th percentile) and nonobstructive CAD in LAD, RCA, and OM1.  Echocardiogram at that time showed normal LV function, normal RV function and no valvular abnormalities.  Patient followed up on 12/2021 with midsternal chest tightness and shortness of breath.  LHC on 01/2022 showed 20% proximal stenosis in RCA and LAD.  The patient had superficial squamous cell carcinoma resected on 04/2022.  She had rectal discomfort and had a biopsy performed on a 2/3 cm mass on 12/2023 that confirmed invasive SCC.  PET scan showed stage III rectal cancer.  She was started on chemoradiation with 5-FU and mitomycin  on 01/31/2024.  The patient presented to the ER 02/02/2024 for substernal chest pain.  Her troponins were elevated at 32, 27, 33, 43.  On 5/15 her troponin increased to 426 she is now experiencing chest pain while sitting.  Her pleuritic symptoms and recent 5 FU exposure was concern for pericarditis.  However with normal ESR/CRP her new nausea, and rising troponin as well as transient ST elevation on EKG plan was for [facility].  LHC on 02/03/2024 showed mild nonobstructive coronary artery disease with proximal LAD lesion 20% stenosis and proximal RCA lesion 20% stenosis.  No suspected myopericarditis based on symptoms and EKG changes.   Colchicine  0.6 mg twice daily was added.  Echocardiogram showed LVEF 55 to 60%, no RWMA, RV function and size normal, trivial MR, aortic sclerosis/calcification.  She was discharged on colchicine  with 1 week of indomethacin  with Protonix  for GI prophylaxis.  Discussed the use of AI scribe software for clinical note transcription with the patient, who gave verbal consent to proceed.  History of Present Illness     Review of systems:  Please see the history of present illness. All other systems are reviewed and otherwise negative. ***      Studies Reviewed:        ***  Risk Assessment/Calculations:   {Does this patient have ATRIAL FIBRILLATION?[phone]} No BP recorded.  {Refresh Note OR Click here to enter BP  :1}***        Physical Exam:   VS:  There were no vitals taken for this visit.   Wt Readings from Last 3 Encounters:  02/16/24 167 lb 3.2 oz (75.8 kg)  02/08/24 173 lb 12.8 oz (78.8 kg)  02/02/24 172 lb (78 kg)    GEN: Well nourished, well developed in no acute distress NECK: No JVD; No carotid bruits CARDIAC: ***RRR, no murmurs, rubs, gallops RESPIRATORY:  Clear to auscultation without rales, wheezing or rhonchi  ABDOMEN: Soft, non-tender, non-distended EXTREMITIES:  No edema; No acute deformity ***      Assessment and Plan:  Assessment and Plan Assessment & Plan      {Are you ordering a CV Procedure (e.g. stress test, cath, DCCV, TEE, etc)?  Press F2        :[id number]}  Dispo:  No follow-ups on file.  Signed, Ava Boatman, NP

## 2024-02-22 NOTE — Telephone Encounter (Signed)
 Contacted [facility], per Dr. Maryalice Smaller. Spoke with Robin. Requested Hospital Admission at [facility] on Monday 02/28/24 for chemo for rectal cancer, per Dr. Sharalyn Dasen.  [facility]bin Dess confirmed patients contact info, stated they will contact the patient when the bed is ready.

## 2024-02-22 NOTE — Patient Instructions (Addendum)
 START Imdur  30 mg Twice daily  Your physician recommends that you schedule a follow-up appointment in: August. ** PLEASE CALL THE OFFICE IN 3 WEEKS TO ARRANGE YOUR FOLLOW UP APPOINTMENT.**  If you have any questions or concerns before your next appointment please send us  a message through [place] or call our office at [phone].    TO LEAVE A MESSAGE FOR THE NURSE SELECT OPTION 2, PLEASE LEAVE A MESSAGE INCLUDING: YOUR NAME DATE OF BIRTH CALL BACK NUMBER REASON FOR CALL**this is important as we prioritize the call backs  YOU WILL RECEIVE A CALL BACK THE SAME DAY AS LONG AS YOU CALL BEFORE 4:00 PM  At the [facility], you and your health needs are our priority. As part of our continuing mission to provide you with exceptional heart care, we have created designated Provider Care Teams. These Care Teams include your primary Cardiologist (physician) and Advanced Practice Providers (APPs- Physician Assistants and Nurse Practitioners) who all work together to provide you with the care you need, when you need it.   You may see any of the following providers on your designated Care Team at your next follow up: Dr Jules Oar Dr Peder Bourdon Dr. Alwin Baars Dr. Arta Lark Amy Marijane Shoulders, NP Ruddy Corral, [place] [facility] [place], [place] Dennise Fitz, NP [place] Lee, NP Shawnee Dellen, NP Luster Salters, PharmD Bevely Brush, PharmD   Please be sure to bring in all your medications bottles to every appointment.    Thank you for choosing [facility]

## 2024-02-22 NOTE — Progress Notes (Signed)
 Office Visit Note   Patient: Andrea [name]           Date of Birth: Dec 20, 1955           MRN: [id number] Visit Date: 02/22/2024              Requested by: Alejandro Hurt, FNP [phone] [address] [place],  [place] [address] PCP: Alejandro Hurt, FNP   Assessment & Plan: Visit Diagnoses:  1. Unilateral primary osteoarthritis, right knee     Plan: Impression is right knee osteoarthritis.  Today, we discussed aspirating the right knee and injecting her with cortisone for which she is agreeable to.  She will follow-up with us  as needed.  Call with concerns or questions.  Follow-Up Instructions: Return if symptoms worsen or fail to improve.   Orders:  Orders Placed This Encounter  Procedures   Large Joint Inj: R knee   No orders of the defined types were placed in this encounter.     Procedures: Large Joint Inj: R knee on 02/22/2024 4:07 PM Indications: pain Details: 22 G needle, anterolateral approach Medications: 2 mL lidocaine  1 %; 2 mL bupivacaine  0.25 %; 40 mg methylPREDNISolone  acetate 40 MG/ML      Clinical Data: No additional findings.   Subjective: Chief Complaint  Patient presents with   Right Knee - Pain    Pain was in anterior right lower leg for a few days felt like the pain made her leg give out.  She states now the pain is more in her knee/ injection in knee helped in February     HPI patient is a pleasant 68 year old female who comes in today with recent pain to the right anterolateral shin.  Symptoms began about a week ago without any injury.  Pain occurred for a few days and then moved to the right knee.  She does have underlying advanced arthritis to the right knee.  She last underwent cortisone injection in February of this year with good relief until this recent pain.  Of note, she is getting chemo and radiation and has to sit in what sounds like a frog-leg position for which she thinks could have possibly caused the lower leg pain.  She  denies any pain to the calf.   Review of Systems as detailed in HPI.  All others reviewed and are negative.   Objective: Vital Signs: There were no vitals taken for this visit.  Physical Exam well-developed well-nourished female in no acute distress.  Alert and oriented x 3.  Ortho Exam right knee exam: Mild tenderness to the anterolateral shin.  No calf tenderness.  Trace effusion.  Range of motion 0 to 110 degrees.  She is neurovascularly intact distally.  Specialty Comments:  No specialty comments available.  Imaging: No new imaging   PMFS History: Patient Active Problem List   Diagnosis Date Noted   Myopericarditis 02/04/2024   Primary hypertension 02/04/2024   Non-ST elevation (NSTEMI) myocardial infarction (HCC) 02/03/2024   Chest pain 02/03/2024   Pleuritic chest pain 02/02/2024   CAD in native artery 02/02/2024   Aortic atherosclerosis (HCC) 02/02/2024   Port-A-Cath in place 01/31/2024   Anal squamous cell carcinoma (HCC) 01/20/2024   AIN III (anal intraepithelial neoplasia III) 11/09/2023   Anal irritation 11/09/2023   History of anal cancer 11/09/2023   Status post total left knee replacement 10/19/2022   Primary osteoarthritis of left knee 09/30/2022   Chest pain of uncertain etiology  Ulcerative colitis (HCC) 07/06/2019   PUD (peptic ulcer disease) 07/06/2019   Past Medical History:  Diagnosis Date   Anemia    Anginal pain (HCC)    Arthritis    Chest discomfort    Disorder of immune system (HCC)    Dyspnea    Dyspnea on exertion    Hypercholesterolemia    Hypertension    Migraine    Non-ulcer dyspepsia    Osteopenia of spine    Peptic ulcer disease    Pneumonia    Polyp of rectum    cancerous   Pre-diabetes    Skin cancer    Ulcerative colitis (HCC)     Family History  Problem Relation Age of Onset   CAD Mother 19   Heart attack Mother    Heart attack Father 28   CAD Brother 41   Skin cancer Brother    Other Son        gluten  sensitivity   Colon cancer Neg Hx    Esophageal cancer Neg Hx    Rectal cancer Neg Hx    Stomach cancer Neg Hx     Past Surgical History:  Procedure Laterality Date   ANKLE FRACTURE SURGERY Left    CARDIAC CATHETERIZATION     COLONOSCOPY     ESOPHAGOGASTRODUODENOSCOPY     hip replacement Bilateral    LEFT HEART CATH AND CORONARY ANGIOGRAPHY N/A 01/21/2022   Procedure: LEFT HEART CATH AND CORONARY ANGIOGRAPHY;  Surgeon: Odie Benne, MD;  Location: [facility];  Service: Cardiovascular;  Laterality: N/A;   LEFT HEART CATH AND CORONARY ANGIOGRAPHY N/A 02/03/2024   Procedure: LEFT HEART CATH AND CORONARY ANGIOGRAPHY;  Surgeon: Wenona Hamilton, MD;  Location: [facility];  Service: Cardiovascular;  Laterality: N/A;   RECTAL BIOPSY N/A 05/04/2022   Procedure: BIOPSY RECTAL VS EXCISION OF PERIANAL LESION UNDER ANOSCOPY;  Surgeon: Melvenia Stabs, MD;  Location: [facility];  Service: General;  Laterality: N/A;   TOTAL KNEE ARTHROPLASTY Left 10/19/2022   Procedure: LEFT TOTAL KNEE ARTHROPLASTY;  Surgeon: Wes Hamman, MD;  Location: [facility];  Service: Orthopedics;  Laterality: Left;   TUBAL LIGATION     TUMOR EXCISION N/A 01/14/2024   Procedure: TRANSRECTAL TUMOR EXCISION;  Surgeon: Melvenia Stabs, MD;  Location: [facility];  Service: General;  Laterality: N/A;  Transanal excision of distal rectal and anal lesion (wide local)   Social History   Occupational History   Occupation: retired  Tobacco Use   Smoking status: Never   Smokeless tobacco: Never  Vaping Use   Vaping status: Never Used  Substance and Sexual Activity   Alcohol use: No   Drug use: No   Sexual activity: Yes

## 2024-02-22 NOTE — Progress Notes (Signed)
 [facility]   Telephone:([phone]) [phone] Fax:(336) [phone]   Clinic Follow up Note   Patient Care Team: Alejandro Hurt, FNP as PCP - General (Family Medicine) Jacqueline Matsu, MD as PCP - Cardiology (Cardiology) Rhetta Cellar, RN as Oncology Nurse Navigator Sonja Willernie, MD as Consulting Physician (Hematology and Oncology)  Date of Service:  02/22/2024  CHIEF COMPLAINT: f/u of anal cancer  CURRENT THERAPY:  Concurrent chemoradiation  Oncology History   Anal squamous cell carcinoma (HCC) cT2N1M0, stage IIIA -previous history of superficially invasive squamous cell carcinoma of anus, status post surgical resection in August 2023, presented with rectal discomfort for several months, and a palpable 2 to 3 cm mass in the anal canal, biopsy confirmed invasive squamous cell carcinoma in April 2025.  -PET Jan 25, 2024 showed long segment anorectal hypermetabolic activity, and hypermetabolic right inguinal and external iliac lymph nodes metastasis.  No distant metastasis. -she started concurrent chemoradiation with 5-FU and mitomycin  on Jan 31, 2024. -she was hospitalized for pericarditis on 02/02/2024, clinically suspicious for 5-fu induced coronary artery spasm   Assessment & Plan Anal cancer Currently undergoing radiation therapy, now in the fourth week, with associated discomfort and skin issues. No diarrhea or significant complications. Blood counts have improved, and renal and hepatic functions are normal. Chemotherapy planned under Dr. Enedina Harrow supervision due to previous vasospasm during 5FU infusion. - Admit to hospital next week for chemotherapy under Dr. Enedina Harrow supervision. - Continue daily radiation therapy. - Monitor cardiac rhythm and administer cardiac medications during hospital stay. - Perform EKG and consult cardiologist as needed. - Schedule PICC line placement on Monday morning. - Cancel current infusion appointment as chemotherapy will be administered  in the hospital. - Schedule follow-up lab and appointment on June 16 or 17. - Order PET scan three months post-chemotherapy. - Schedule endoscopy six to eight weeks post-treatment for rectal exam.  Chest pain during chemotherapy Chest pain likely due to vasospasm during 5FU infusion. Cardiologist appointment scheduled. Plan to initiate oral cardiac medications prior to hospital admission and continue during chemotherapy to manage vasospasm. Close monitoring with EKG and cardiologist consultation if needed. - Attend cardiologist appointment today. - Start oral cardiac medications a few days before hospital admission. - Monitor closely for chest pain during chemotherapy in the hospital. - Perform EKG and consult cardiologist if chest pain occurs.  Plan - She is tolerating radiation well overall - Lab reviewed, neutropenia and thrombocytopenia resolved - She is due for week 5 chemotherapy next Monday, plan to admit her to [facility] with close monitoring due to her previous chest pain from chemo. - She will see cardiologist Dr. Alease Amend later today, I will coordinate her care with cardiology   SUMMARY OF ONCOLOGIC HISTORY: Oncology History  Anal squamous cell carcinoma (HCC)  12/29/2023 Pathology Results   A. PERIANAL, RIGHT POSTERIOR, BIOPSY:       Invasive squamous cell carcinoma, moderately differentiated. Suspicious for lymphovascular invasion.  B. PERIANAL, LEFT POSTERIOR, BIOPSY:       High-grade squamous intraepithelial lesion (HSIL / AIN 3).  No evidence of definitive invasion.    01/20/2024 Initial Diagnosis   Anal cancer (HCC)   01/26/2024 Cancer Staging   Staging form: Anus, AJCC 8th Edition - Clinical: Stage IIIA (cT2, cN1c, cM0) - Signed by Sonja Cornelia, MD on 01/26/2024   01/31/2024 -  Chemotherapy   Patient is on Treatment Plan : ANUS Mitomycin  D1,28 + 5FU D1-4, 28-31 q32d        Discussed  the use of AI scribe software for clinical note transcription with the patient, who gave  verbal consent to proceed.  History of Present Illness Andrea [name] is a 68 year old female with anal cancer who presents for follow-up.  She is in her fourth week of radiation therapy for anal cancer, experiencing manageable discomfort including skin peeling and rectal pain. Earlier symptoms of skin breakdown and diarrhea have resolved. No current diarrhea or bleeding. Bowel movements are stable.  Recent blood work shows normalized white blood cell and platelet counts, with hemoglobin at 10.7. Kidney and liver function tests are normal, and creatinine levels have returned to normal. She maintains good nutrition and hydration.     All other systems were reviewed with the patient and are negative.  MEDICAL HISTORY:  Past Medical History:  Diagnosis Date   Anemia    Anginal pain (HCC)    Arthritis    Chest discomfort    Disorder of immune system (HCC)    Dyspnea    Dyspnea on exertion    Hypercholesterolemia    Hypertension    Migraine    Non-ulcer dyspepsia    Osteopenia of spine    Peptic ulcer disease    Pneumonia    Polyp of rectum    cancerous   Pre-diabetes    Skin cancer    Ulcerative colitis (HCC)     SURGICAL HISTORY: Past Surgical History:  Procedure Laterality Date   ANKLE FRACTURE SURGERY Left    CARDIAC CATHETERIZATION     COLONOSCOPY     ESOPHAGOGASTRODUODENOSCOPY     hip replacement Bilateral    LEFT HEART CATH AND CORONARY ANGIOGRAPHY N/A 01/21/2022   Procedure: LEFT HEART CATH AND CORONARY ANGIOGRAPHY;  Surgeon: Odie Benne, MD;  Location: [facility];  Service: Cardiovascular;  Laterality: N/A;   LEFT HEART CATH AND CORONARY ANGIOGRAPHY N/A 02/03/2024   Procedure: LEFT HEART CATH AND CORONARY ANGIOGRAPHY;  Surgeon: Wenona Hamilton, MD;  Location: [facility];  Service: Cardiovascular;  Laterality: N/A;   RECTAL BIOPSY N/A 05/04/2022   Procedure: BIOPSY RECTAL VS EXCISION OF PERIANAL LESION UNDER ANOSCOPY;  Surgeon: Melvenia Stabs, MD;  Location: [facility];  Service: General;  Laterality: N/A;   TOTAL KNEE ARTHROPLASTY Left 10/19/2022   Procedure: LEFT TOTAL KNEE ARTHROPLASTY;  Surgeon: Wes Hamman, MD;  Location: [facility];  Service: Orthopedics;  Laterality: Left;   TUBAL LIGATION     TUMOR EXCISION N/A 01/14/2024   Procedure: TRANSRECTAL TUMOR EXCISION;  Surgeon: Melvenia Stabs, MD;  Location: [facility];  Service: General;  Laterality: N/A;  Transanal excision of distal rectal and anal lesion (wide local)    I have reviewed the social history and family history with the patient and they are unchanged from previous note.  ALLERGIES:  is allergic to codeine.  MEDICATIONS:  Current Outpatient Medications  Medication Sig Dispense Refill   acetaminophen  (TYLENOL ) 500 MG tablet Take 1,000 mg by mouth every 6 (six) hours as needed for moderate pain.     albuterol  (VENTOLIN  HFA) 108 (90 Base) MCG/ACT inhaler Inhale 1-2 puffs into the lungs every 4 (four) hours as needed for wheezing or shortness of breath. 8 g 0   ammonium lactate (LAC-HYDRIN) 12 % lotion Apply 1 Application topically daily.     atorvastatin  (LIPITOR) 80 MG tablet TAKE ONE TABLET BY MOUTH DAILY (Patient taking differently: Take 80 mg by mouth daily. Take 1/2 tablet daily) 90 tablet  1   colchicine  0.6 MG tablet Take 1 tablet (0.6 mg total) by mouth 2 (two) times daily. 60 tablet 1   diphenoxylate -atropine  (LOMOTIL ) 2.5-0.025 MG tablet Take 1-2 tablets by mouth 4 (four) times daily as needed for diarrhea or loose stools. 60 tablet 0   ezetimibe  (ZETIA ) 10 MG tablet TAKE 1 TABLET BY MOUTH DAILY 90 tablet 3   fluticasone  (FLONASE ) 50 MCG/ACT nasal spray Place 2 sprays into both nostrils daily. (Patient taking differently: Place 2 sprays into both nostrils daily as needed for allergies.) 1 g 0   losartan  (COZAAR ) 25 MG tablet Take 25 mg by mouth daily.     magic mouthwash (nystatin , lidocaine , diphenhydrAMINE , alum & mag hydroxide)  suspension Swish and swallow 5 mLs by mouth 3 (three) times daily as needed for mouth pain. 140 mL 1   ondansetron  (ZOFRAN ) 8 MG tablet Take 1 tablet (8 mg total) by mouth every 8 (eight) hours as needed for nausea or vomiting. 30 tablet 1   pantoprazole  (PROTONIX ) 40 MG tablet Take 1 tablet (40 mg total) by mouth 2 (two) times daily. 60 tablet 0   PARoxetine  (PAXIL ) 20 MG tablet Take 10 mg by mouth daily.     SUMAtriptan (IMITREX) 25 MG tablet Take 25 mg by mouth every 2 (two) hours as needed for migraine.     vedolizumab  (ENTYVIO ) 300 MG injection Inject 300 mg into the vein every 8 (eight) weeks.     isosorbide  mononitrate (IMDUR ) 30 MG 24 hr tablet Take 1 tablet (30 mg total) by mouth 2 (two) times daily. 180 tablet 3   nitroGLYCERIN  (NITROSTAT ) 0.4 MG SL tablet Place 1 tablet (0.4 mg total) under the tongue every 5 (five) minutes as needed for chest pain. 25 tablet 2   No current facility-administered medications for this visit.    PHYSICAL EXAMINATION: ECOG PERFORMANCE STATUS: 1 - Symptomatic but completely ambulatory  Vitals:   02/22/24 1104  BP: 130/66  Pulse: 82  Resp: 17  Temp: 98.2 F (36.8 C)  SpO2: 96%   Wt Readings from Last 3 Encounters:  02/22/24 168 lb (76.2 kg)  02/22/24 169 lb 9.6 oz (76.9 kg)  02/16/24 167 lb 3.2 oz (75.8 kg)     GENERAL:alert, no distress and comfortable SKIN: skin color, texture, turgor are normal, no rashes or significant lesions EYES: normal, Conjunctiva are pink and non-injected, sclera clear Musculoskeletal:no cyanosis of digits and no clubbing  NEURO: alert & oriented x 3 with fluent speech, no focal motor/sensory deficits  Physical Exam    LABORATORY DATA:  I have reviewed the data as listed    Latest Ref Rng & Units 02/22/2024   10:05 AM 02/16/2024    9:55 AM 02/08/2024   10:48 AM  CBC  WBC 4.0 - 10.5 K/uL 4.5  1.7  3.6   Hemoglobin 12.0 - 15.0 g/dL 19.1  47.8  9.8   Hematocrit 36.0 - 46.0 % 30.6  30.9  28.5   Platelets 150  - 400 K/uL 244  91  126         Latest Ref Rng & Units 02/22/2024   10:05 AM 02/16/2024    9:55 AM 02/08/2024   10:48 AM  CMP  Glucose 70 - 99 mg/dL 295  621  308   BUN 8 - 23 mg/dL 23  29  28    Creatinine 0.44 - 1.00 mg/dL 6.57  8.46  9.62   Sodium 135 - 145 mmol/L 137  137  138  Potassium 3.5 - 5.1 mmol/L 3.9  3.4  3.9   Chloride 98 - 111 mmol/L 105  105  105   CO2 22 - 32 mmol/L 25  24  25    Calcium  8.9 - 10.3 mg/dL 9.5  9.6  8.6   Total Protein 6.5 - 8.1 g/dL 7.0  7.1  6.4   Total Bilirubin 0.0 - 1.2 mg/dL 0.4  0.4  0.5   Alkaline Phos 38 - 126 U/L 93  76  56   AST 15 - 41 U/L 21  24  17    ALT 0 - 44 U/L 20  23  16        RADIOGRAPHIC STUDIES: I have personally reviewed the radiological images as listed and agreed with the findings in the report. No results found.    No orders of the defined types were placed in this encounter.  All questions were answered. The patient knows to call the clinic with any problems, questions or concerns. No barriers to learning was detected. The total time spent in the appointment was 25 minutes, including review of chart and various tests results, discussions about plan of care and coordination of care plan     Sonja [place], MD 02/22/2024

## 2024-02-22 NOTE — Progress Notes (Signed)
  Cardio-Oncology Clinic Consult Note   Referring Physician: Dr. Maryalice Smaller Primary Care: Primary Cardiologist:  HPI:  Andrea [name] is a 68 y.o. female with past medical history of anal squamous cell carcinoma, recent admission for vasospasm in the setting of continuous 5-FU who has been referred by Dr. Maryalice Smaller to establish in the cardio-oncology clinic for monitoring of cardio-toxicity while undergoing chemotherapy.     {Anything typed between these two boxes will persist and can be pulled forward to future notes. This phrase will delete itself when the note is signed :1}   Oncology History  Anal squamous cell carcinoma (HCC)  12/29/2023 Pathology Results   A. PERIANAL, RIGHT POSTERIOR, BIOPSY:       Invasive squamous cell carcinoma, moderately differentiated. Suspicious for lymphovascular invasion.  B. PERIANAL, LEFT POSTERIOR, BIOPSY:       High-grade squamous intraepithelial lesion (HSIL / AIN 3).  No evidence of definitive invasion.    01/20/2024 Initial Diagnosis   Anal cancer (HCC)   01/26/2024 Cancer Staging   Staging form: Anus, AJCC 8th Edition - Clinical: Stage IIIA (cT2, cN1c, cM0) - Signed by Sonja Gonzales, MD on 01/26/2024   01/31/2024 -  Chemotherapy   Patient is on Treatment Plan : ANUS Mitomycin  D1,28 + 5FU D1-4, 28-31 q32d           Patient overall reports that she is doing well. She has had no further chest pain since the infusion stopped. We discussed her case and explained the clinical course of 5-FU induced vasospasm.    Medical history and current medications were reviewed in Epic as part of clinic visit.   PHYSICAL EXAM: Vitals:   02/22/24 1448  BP: 104/66  Pulse: 90  SpO2: 97%   GENERAL: Well nourished and in no apparent distress at rest.  HEENT: The mucous membranes are pink and moist.   PULM:  Normal work of breathing, clear to auscultation bilaterally. Respirations are unlabored.  CARDIAC:  JVP: ***         Normal rate with regular rhythm. No murmurs,  rubs or gallops.  *** edema.  ABDOMEN: Soft, non-tender, non-distended. NEUROLOGIC: Patient is oriented x3 with no focal or lateralizing neurologic deficits.  PSYCH: Patients affect is appropriate, there is no evidence of anxiety or depression.  SKIN: Warm and dry; no lesions or wounds. Warm and well perfused extremities.    ASSESSMENT & PLAN:  1. ***  Explained incidence of *** cardiotoxicity and role of Cardio-oncology clinic at length. Echo images reviewed personally. All parameters stable. Reviewed signs and symptoms of HF to look for. Ok to begin chemotherapy. Follow-up with echo in *** months.   Arta Lark, MD Advanced Heart Failure Mechanical Circulatory Support Cardio-Oncology 02/22/24

## 2024-02-23 ENCOUNTER — Other Ambulatory Visit: Payer: Self-pay

## 2024-02-23 ENCOUNTER — Ambulatory Visit
Admission: RE | Admit: 2024-02-23 | Discharge: 2024-02-23 | Disposition: A | Discharge status: Home / Self Care | Source: Ambulatory Visit | Attending: Radiation Oncology | Admitting: Radiation Oncology

## 2024-02-23 DIAGNOSIS — Z510 Encounter for antineoplastic radiation therapy: Secondary | ICD-10-CM | POA: Diagnosis not present

## 2024-02-23 LAB — RAD ONC ARIA SESSION SUMMARY
Course Elapsed Days: 23
Plan Fractions Treated to Date: 14
Plan Prescribed Dose Per Fraction: 1.8 Gy
Plan Total Fractions Prescribed: 30
Plan Total Prescribed Dose: 54 Gy
Reference Point Dosage Given to Date: 25.2 Gy
Reference Point Session Dosage Given: 1.8 Gy
Session Number: 14

## 2024-02-24 ENCOUNTER — Other Ambulatory Visit: Payer: Self-pay

## 2024-02-24 ENCOUNTER — Ambulatory Visit
Admission: RE | Admit: 2024-02-24 | Discharge: 2024-02-24 | Disposition: A | Discharge status: Home / Self Care | Source: Ambulatory Visit | Attending: Radiation Oncology | Admitting: Radiation Oncology

## 2024-02-24 ENCOUNTER — Telehealth (HOSPITAL_COMMUNITY): Payer: Self-pay | Admitting: Cardiology

## 2024-02-24 ENCOUNTER — Encounter: Payer: Self-pay | Admitting: Hematology

## 2024-02-24 DIAGNOSIS — Z510 Encounter for antineoplastic radiation therapy: Secondary | ICD-10-CM | POA: Diagnosis not present

## 2024-02-24 LAB — RAD ONC ARIA SESSION SUMMARY
Course Elapsed Days: 24
Plan Fractions Treated to Date: 15
Plan Prescribed Dose Per Fraction: 1.8 Gy
Plan Total Fractions Prescribed: 30
Plan Total Prescribed Dose: 54 Gy
Reference Point Dosage Given to Date: 27 Gy
Reference Point Session Dosage Given: 1.8 Gy
Session Number: 15

## 2024-02-24 NOTE — Telephone Encounter (Signed)
 Pt called to report after starting new (imdur ) medication she has noticed HA  Was told to call back of headaches started   No pre med/ tylenol      Please advise

## 2024-02-25 ENCOUNTER — Telehealth (HOSPITAL_COMMUNITY): Payer: Self-pay

## 2024-02-25 ENCOUNTER — Ambulatory Visit
Admission: RE | Admit: 2024-02-25 | Discharge: 2024-02-25 | Disposition: A | Discharge status: Home / Self Care | Source: Ambulatory Visit | Attending: Radiation Oncology | Admitting: Radiation Oncology

## 2024-02-25 ENCOUNTER — Other Ambulatory Visit: Payer: Self-pay

## 2024-02-25 LAB — RAD ONC ARIA SESSION SUMMARY
Course Elapsed Days: 25
Plan Fractions Treated to Date: 16
Plan Prescribed Dose Per Fraction: 1.8 Gy
Plan Total Fractions Prescribed: 30
Plan Total Prescribed Dose: 54 Gy
Reference Point Dosage Given to Date: 28.8 Gy
Reference Point Session Dosage Given: 1.8 Gy
Session Number: 16

## 2024-02-25 MED ORDER — ISOSORBIDE MONONITRATE ER 30 MG PO TB24: 30.0000 mg | ORAL_TABLET | Freq: Every evening | ORAL | Status: DC

## 2024-02-25 MED ORDER — DILTIAZEM HCL ER COATED BEADS 180 MG PO CP24: 180.0000 mg | ORAL_CAPSULE | Freq: Every day | ORAL | 3 refills | Status: DC

## 2024-02-25 NOTE — Telephone Encounter (Signed)
 Pt aware, agreeable, and verbalized understanding If headaches continue she will reach out and let us  know   call the patient and start these medications? Would continue imdur  30mg  nightly, start diltiazem 180mg  daily, and discontinue losartan . Would trial tylenol  for headaches to continue the imdur  if able.

## 2024-02-28 ENCOUNTER — Inpatient Hospital Stay

## 2024-02-28 ENCOUNTER — Ambulatory Visit: Admitting: Nurse Practitioner

## 2024-02-28 ENCOUNTER — Inpatient Hospital Stay (HOSPITAL_COMMUNITY)
Admission: RE | Admit: 2024-02-28 | Discharge: 2024-03-03 | DRG: 847 | Disposition: A | Discharge status: Home / Self Care | Source: Ambulatory Visit | Attending: Oncology | Admitting: Oncology

## 2024-02-28 ENCOUNTER — Other Ambulatory Visit: Payer: Self-pay | Admitting: Hematology and Oncology

## 2024-02-28 ENCOUNTER — Other Ambulatory Visit: Payer: Self-pay | Admitting: Cardiology

## 2024-02-28 ENCOUNTER — Encounter (HOSPITAL_COMMUNITY): Payer: Self-pay | Admitting: Hematology and Oncology

## 2024-02-28 ENCOUNTER — Other Ambulatory Visit: Payer: Self-pay

## 2024-02-28 ENCOUNTER — Encounter (HOSPITAL_COMMUNITY): Payer: Self-pay

## 2024-02-28 ENCOUNTER — Ambulatory Visit
Admission: RE | Admit: 2024-02-28 | Discharge: 2024-02-28 | Disposition: A | Discharge status: Home / Self Care | Source: Ambulatory Visit | Attending: Radiation Oncology | Admitting: Radiation Oncology

## 2024-02-28 ENCOUNTER — Ambulatory Visit (HOSPITAL_COMMUNITY)
Admission: RE | Admit: 2024-02-28 | Discharge: 2024-02-28 | Disposition: A | Discharge status: Home / Self Care | Source: Ambulatory Visit | Attending: Hematology | Admitting: Hematology

## 2024-02-28 ENCOUNTER — Encounter

## 2024-02-28 ENCOUNTER — Encounter: Payer: Self-pay | Admitting: Hematology

## 2024-02-28 DIAGNOSIS — F411 Generalized anxiety disorder: Secondary | ICD-10-CM

## 2024-02-28 DIAGNOSIS — Z96643 Presence of artificial hip joint, bilateral: Secondary | ICD-10-CM | POA: Diagnosis present

## 2024-02-28 DIAGNOSIS — F419 Anxiety disorder, unspecified: Secondary | ICD-10-CM | POA: Diagnosis present

## 2024-02-28 DIAGNOSIS — Z5111 Encounter for antineoplastic chemotherapy: Secondary | ICD-10-CM | POA: Diagnosis present

## 2024-02-28 DIAGNOSIS — T451X5A Adverse effect of antineoplastic and immunosuppressive drugs, initial encounter: Secondary | ICD-10-CM | POA: Diagnosis present

## 2024-02-28 DIAGNOSIS — I201 Angina pectoris with documented spasm: Secondary | ICD-10-CM | POA: Diagnosis not present

## 2024-02-28 DIAGNOSIS — Z8249 Family history of ischemic heart disease and other diseases of the circulatory system: Secondary | ICD-10-CM | POA: Diagnosis not present

## 2024-02-28 DIAGNOSIS — F32A Depression, unspecified: Secondary | ICD-10-CM | POA: Diagnosis present

## 2024-02-28 DIAGNOSIS — K5190 Ulcerative colitis, unspecified, without complications: Secondary | ICD-10-CM | POA: Diagnosis present

## 2024-02-28 DIAGNOSIS — K5900 Constipation, unspecified: Secondary | ICD-10-CM

## 2024-02-28 DIAGNOSIS — Z96652 Presence of left artificial knee joint: Secondary | ICD-10-CM | POA: Diagnosis present

## 2024-02-28 DIAGNOSIS — I25111 Atherosclerotic heart disease of native coronary artery with angina pectoris with documented spasm: Secondary | ICD-10-CM | POA: Diagnosis present

## 2024-02-28 DIAGNOSIS — G43909 Migraine, unspecified, not intractable, without status migrainosus: Secondary | ICD-10-CM | POA: Diagnosis present

## 2024-02-28 DIAGNOSIS — Z79899 Other long term (current) drug therapy: Secondary | ICD-10-CM | POA: Diagnosis not present

## 2024-02-28 DIAGNOSIS — I2510 Atherosclerotic heart disease of native coronary artery without angina pectoris: Secondary | ICD-10-CM

## 2024-02-28 DIAGNOSIS — I10 Essential (primary) hypertension: Secondary | ICD-10-CM | POA: Diagnosis present

## 2024-02-28 DIAGNOSIS — I2583 Coronary atherosclerosis due to lipid rich plaque: Secondary | ICD-10-CM | POA: Diagnosis present

## 2024-02-28 DIAGNOSIS — K219 Gastro-esophageal reflux disease without esophagitis: Secondary | ICD-10-CM | POA: Diagnosis present

## 2024-02-28 DIAGNOSIS — Z79620 Long term (current) use of immunosuppressive biologic: Secondary | ICD-10-CM

## 2024-02-28 DIAGNOSIS — R079 Chest pain, unspecified: Secondary | ICD-10-CM | POA: Diagnosis not present

## 2024-02-28 DIAGNOSIS — D638 Anemia in other chronic diseases classified elsewhere: Secondary | ICD-10-CM | POA: Diagnosis not present

## 2024-02-28 DIAGNOSIS — D630 Anemia in neoplastic disease: Secondary | ICD-10-CM | POA: Diagnosis present

## 2024-02-28 DIAGNOSIS — E785 Hyperlipidemia, unspecified: Secondary | ICD-10-CM

## 2024-02-28 DIAGNOSIS — Z85828 Personal history of other malignant neoplasm of skin: Secondary | ICD-10-CM

## 2024-02-28 DIAGNOSIS — E7800 Pure hypercholesterolemia, unspecified: Secondary | ICD-10-CM | POA: Diagnosis present

## 2024-02-28 DIAGNOSIS — C210 Malignant neoplasm of anus, unspecified: Secondary | ICD-10-CM | POA: Diagnosis present

## 2024-02-28 LAB — RAD ONC ARIA SESSION SUMMARY
Course Elapsed Days: 28
Plan Fractions Treated to Date: 17
Plan Prescribed Dose Per Fraction: 1.8 Gy
Plan Total Fractions Prescribed: 30
Plan Total Prescribed Dose: 54 Gy
Reference Point Dosage Given to Date: 30.6 Gy
Reference Point Session Dosage Given: 1.8 Gy
Session Number: 17

## 2024-02-28 LAB — CBC WITH DIFFERENTIAL/PLATELET
Abs Immature Granulocytes: 0.02 10*3/uL (ref 0.00–0.07)
Basophils Absolute: 0 10*3/uL (ref 0.0–0.1)
Basophils Relative: 1 %
Eosinophils Absolute: 0.2 10*3/uL (ref 0.0–0.5)
Eosinophils Relative: 5 %
HCT: 28.2 % — ABNORMAL LOW (ref 36.0–46.0)
Hemoglobin: 9.3 g/dL — ABNORMAL LOW (ref 12.0–15.0)
Immature Granulocytes: 1 %
Lymphocytes Relative: 7 %
Lymphs Abs: 0.3 10*3/uL — ABNORMAL LOW (ref 0.7–4.0)
MCH: 29 pg (ref 26.0–34.0)
MCHC: 33 g/dL (ref 30.0–36.0)
MCV: 87.9 fL (ref 80.0–100.0)
Monocytes Absolute: 0.6 10*3/uL (ref 0.1–1.0)
Monocytes Relative: 15 %
Neutro Abs: 3.1 10*3/uL (ref 1.7–7.7)
Neutrophils Relative %: 71 %
Platelets: 213 10*3/uL (ref 150–400)
RBC: 3.21 MIL/uL — ABNORMAL LOW (ref 3.87–5.11)
RDW: 14.4 % (ref 11.5–15.5)
WBC: 4.3 10*3/uL (ref 4.0–10.5)
nRBC: 0 % (ref 0.0–0.2)

## 2024-02-28 LAB — COMPREHENSIVE METABOLIC PANEL WITH GFR
ALT: 17 U/L (ref 0–44)
AST: 21 U/L (ref 15–41)
Albumin: 3.3 g/dL — ABNORMAL LOW (ref 3.5–5.0)
Alkaline Phosphatase: 99 U/L (ref 38–126)
Anion gap: 9 (ref 5–15)
BUN: 17 mg/dL (ref 8–23)
CO2: 22 mmol/L (ref 22–32)
Calcium: 8.8 mg/dL — ABNORMAL LOW (ref 8.9–10.3)
Chloride: 105 mmol/L (ref 98–111)
Creatinine, Ser: 0.76 mg/dL (ref 0.44–1.00)
GFR, Estimated: >60 mL/min (ref >60)
Glucose, Bld: 116 mg/dL — ABNORMAL HIGH (ref 70–99)
Potassium: 3.7 mmol/L (ref 3.5–5.1)
Sodium: 136 mmol/L (ref 135–145)
Total Bilirubin: 0.5 mg/dL (ref 0.0–1.2)
Total Protein: 6.3 g/dL — ABNORMAL LOW (ref 6.5–8.1)

## 2024-02-28 LAB — TROPONIN I (HIGH SENSITIVITY): Troponin I (High Sensitivity): 6 ng/L (ref <18)

## 2024-02-28 MED ORDER — DILTIAZEM HCL ER COATED BEADS 180 MG PO CP24: 180.0000 mg | ORAL_CAPSULE | Freq: Every day | ORAL | Status: DC

## 2024-02-28 MED ORDER — NIFEDIPINE ER OSMOTIC RELEASE 30 MG PO TB24: 30.0000 mg | ORAL_TABLET | Freq: Every day | ORAL | Status: DC

## 2024-02-28 MED ORDER — DEXTROSE 5 % IV SOLN: INTRAVENOUS | Status: DC

## 2024-02-28 MED ORDER — NIFEDIPINE ER OSMOTIC RELEASE 30 MG PO TB24
30.0000 mg | ORAL_TABLET | Freq: Once | ORAL | Status: AC
  Administered 2024-02-29: 30 mg via ORAL
  Filled 2024-02-28: qty 1

## 2024-02-28 MED ORDER — ISOSORBIDE MONONITRATE ER 30 MG PO TB24: 30.0000 mg | ORAL_TABLET | Freq: Once | ORAL | Status: DC

## 2024-02-28 MED ORDER — DOCUSATE SODIUM 100 MG PO CAPS
100.0000 mg | ORAL_CAPSULE | Freq: Every day | ORAL | Status: DC
  Administered 2024-02-28 – 2024-03-03 (×5): 100 mg via ORAL
  Filled 2024-02-28 (×5): qty 1

## 2024-02-28 MED ORDER — ONDANSETRON HCL 4 MG PO TABS: 4.0000 mg | ORAL_TABLET | Freq: Three times a day (TID) | ORAL | Status: DC | PRN

## 2024-02-28 MED ORDER — HEPARIN SOD (PORK) LOCK FLUSH 100 UNIT/ML IV SOLN
INTRAVENOUS | Status: AC
  Filled 2024-02-28: qty 5

## 2024-02-28 MED ORDER — SODIUM CHLORIDE 0.9 % IV SOLN: INTRAVENOUS | Status: DC

## 2024-02-28 MED ORDER — HYDROCORTISONE (PERIANAL) 2.5 % EX CREA: 1.0000 | TOPICAL_CREAM | Freq: Two times a day (BID) | CUTANEOUS | Status: DC | PRN

## 2024-02-28 MED ORDER — LIDOCAINE HCL 1 % IJ SOLN
INTRAMUSCULAR | Status: AC
  Filled 2024-02-28: qty 20

## 2024-02-28 MED ORDER — DILTIAZEM HCL ER COATED BEADS 180 MG PO CP24
180.0000 mg | ORAL_CAPSULE | Freq: Every day | ORAL | Status: DC
  Administered 2024-02-29 – 2024-03-03 (×4): 180 mg via ORAL
  Filled 2024-02-28 (×4): qty 1

## 2024-02-28 MED ORDER — NIFEDIPINE ER OSMOTIC RELEASE 30 MG PO TB24
30.0000 mg | ORAL_TABLET | Freq: Every day | ORAL | Status: DC
  Administered 2024-02-29: 30 mg via ORAL
  Filled 2024-02-28 (×2): qty 1

## 2024-02-28 MED ORDER — HEPARIN SOD (PORK) LOCK FLUSH 100 UNIT/ML IV SOLN: 500.0000 [IU] | Freq: Once | INTRAVENOUS | Status: DC

## 2024-02-28 MED ORDER — IBUPROFEN 200 MG PO TABS
200.0000 mg | ORAL_TABLET | Freq: Four times a day (QID) | ORAL | Status: DC | PRN
  Administered 2024-02-28 – 2024-03-03 (×9): 200 mg via ORAL
  Filled 2024-02-28 (×9): qty 1

## 2024-02-28 MED ORDER — ISOSORBIDE MONONITRATE ER 30 MG PO TB24
30.0000 mg | ORAL_TABLET | Freq: Once | ORAL | Status: AC
  Administered 2024-02-28: 30 mg via ORAL
  Filled 2024-02-28: qty 1

## 2024-02-28 MED ORDER — ACETAMINOPHEN 325 MG PO TABS
650.0000 mg | ORAL_TABLET | ORAL | Status: DC | PRN
  Administered 2024-02-28 – 2024-03-03 (×8): 650 mg via ORAL
  Filled 2024-02-28 (×8): qty 2

## 2024-02-28 MED ORDER — CHLORHEXIDINE GLUCONATE CLOTH 2 % EX PADS
6.0000 | MEDICATED_PAD | Freq: Every day | CUTANEOUS | Status: DC
  Administered 2024-02-28 – 2024-03-03 (×5): 6 via TOPICAL

## 2024-02-28 MED ORDER — PROCHLORPERAZINE EDISYLATE 10 MG/2ML IJ SOLN
10.0000 mg | Freq: Four times a day (QID) | INTRAMUSCULAR | Status: DC | PRN
  Administered 2024-02-28 – 2024-02-29 (×2): 10 mg via INTRAVENOUS
  Filled 2024-02-28 (×2): qty 2

## 2024-02-28 MED ORDER — SENNOSIDES-DOCUSATE SODIUM 8.6-50 MG PO TABS: 1.0000 | ORAL_TABLET | Freq: Every evening | ORAL | Status: DC | PRN

## 2024-02-28 MED ORDER — MITOMYCIN CHEMO IV INJECTION 20 MG
10.0000 mg/m2 | Freq: Once | INTRAVENOUS | Status: AC
  Administered 2024-02-28: 19 mg via INTRAVENOUS
  Filled 2024-02-28: qty 38

## 2024-02-28 MED ORDER — SODIUM CHLORIDE 0.9 % IV SOLN: 8.0000 mg | Freq: Three times a day (TID) | INTRAVENOUS | Status: DC | PRN

## 2024-02-28 MED ORDER — PROCHLORPERAZINE MALEATE 10 MG PO TABS
10.0000 mg | ORAL_TABLET | Freq: Once | ORAL | Status: AC
  Administered 2024-02-28: 10 mg via ORAL
  Filled 2024-02-28: qty 1

## 2024-02-28 MED ORDER — NIFEDIPINE 10 MG PO CAPS
30.0000 mg | ORAL_CAPSULE | Freq: Three times a day (TID) | ORAL | Status: DC
  Filled 2024-02-28: qty 3

## 2024-02-28 MED ORDER — FLUOROURACIL CHEMO INJECTION 5 GM/100ML
7000.0000 mg | Freq: Once | INTRAVENOUS | Status: AC
  Administered 2024-02-28: 7000 mg via INTRAVENOUS
  Filled 2024-02-28: qty 140

## 2024-02-28 MED ORDER — DILTIAZEM HCL 30 MG PO TABS
30.0000 mg | ORAL_TABLET | ORAL | Status: AC
  Administered 2024-02-28: 30 mg via ORAL
  Filled 2024-02-28: qty 1

## 2024-02-28 MED ORDER — ALUM & MAG HYDROXIDE-SIMETH 200-200-20 MG/5ML PO SUSP
60.0000 mL | ORAL | Status: DC | PRN
  Administered 2024-03-02: 60 mL via ORAL
  Filled 2024-02-28: qty 60

## 2024-02-28 MED ORDER — ONDANSETRON 4 MG PO TBDP: 4.0000 mg | ORAL_TABLET | Freq: Three times a day (TID) | ORAL | Status: DC | PRN

## 2024-02-28 MED ORDER — ENOXAPARIN SODIUM 40 MG/0.4ML IJ SOSY
40.0000 mg | PREFILLED_SYRINGE | INTRAMUSCULAR | Status: DC
  Administered 2024-02-28 – 2024-03-02 (×4): 40 mg via SUBCUTANEOUS
  Filled 2024-02-28 (×7): qty 0.4

## 2024-02-28 MED ORDER — ONDANSETRON HCL 4 MG/2ML IJ SOLN
4.0000 mg | Freq: Three times a day (TID) | INTRAMUSCULAR | Status: DC | PRN
  Administered 2024-02-28 – 2024-03-02 (×2): 4 mg via INTRAVENOUS
  Filled 2024-02-28 (×2): qty 2

## 2024-02-28 MED ORDER — SODIUM CHLORIDE 0.9% FLUSH
10.0000 mL | Freq: Two times a day (BID) | INTRAVENOUS | Status: DC
  Administered 2024-02-28 – 2024-03-03 (×5): 10 mL

## 2024-02-28 MED ORDER — SODIUM CHLORIDE 0.9% FLUSH: 10.0000 mL | INTRAVENOUS | Status: DC | PRN

## 2024-02-28 MED ORDER — ISOSORBIDE MONONITRATE ER 30 MG PO TB24
30.0000 mg | ORAL_TABLET | Freq: Two times a day (BID) | ORAL | Status: DC
  Administered 2024-02-29 – 2024-03-01 (×3): 30 mg via ORAL
  Filled 2024-02-28 (×3): qty 1

## 2024-02-28 MED ORDER — LIDOCAINE HCL 1 % IJ SOLN
20.0000 mL | Freq: Once | INTRAMUSCULAR | Status: AC
  Administered 2024-02-28: 3 mL via INTRADERMAL

## 2024-02-28 MED ORDER — ISOSORBIDE MONONITRATE ER 30 MG PO TB24: 30.0000 mg | ORAL_TABLET | Freq: Two times a day (BID) | ORAL | Status: DC

## 2024-02-28 MED ORDER — NIFEDIPINE 10 MG PO CAPS
30.0000 mg | ORAL_CAPSULE | Freq: Once | ORAL | Status: AC
  Administered 2024-02-28: 30 mg via ORAL
  Filled 2024-02-28: qty 3

## 2024-02-28 MED ORDER — GUAIFENESIN-DM 100-10 MG/5ML PO SYRP: 10.0000 mL | ORAL_SOLUTION | ORAL | Status: DC | PRN

## 2024-02-28 MED ORDER — ORAL CARE MOUTH RINSE: 15.0000 mL | OROMUCOSAL | Status: DC | PRN

## 2024-02-28 NOTE — Consult Note (Signed)
 Cardiology Consultation:   Patient ID: Andrea [name] MRN: [id number]; DOB: 01-24-1956  Admit date: 02/28/2024 Date of Consult: 02/28/2024  Primary Care Provider: Alejandro Hurt, FNP [facility] Cardiologist: Andrea Keas, MD  [facility] Electrophysiologist:  Andrea Nunnery, MD   Patient Profile:   Andrea [name] is a 68 y.o. female with a hx of ASCAD (Coronary CTA revealed calcium  score of 190, 89th percentile for age and sex matched control, mild nonobstructive CAD in the proximal portion of LAD, RCA, and OM1), hypertension, hyperlipidemia, ulcerative colitis, peptic ulcer disease and anal cancer Who is being seen today for the evaluation to follow patient while undergoing chemotherapy with 5-FU for her anal carcinoma at the request of Andrea Lark, MD and Dr. Marton [name].   History of Present Illness:   Andrea [name] is a very pleasant 68 year old female with a history of hypertension, hyperlipidemia, ulcerative colitis, peptic ulcer disease.  She was initially seen by me in 2020 for evaluation of chest pain and underwent a coronary CTA which revealed calcium  score of 190, 89th percentile for age and sex matched control, mild nonobstructive CAD in the proximal portion of LAD, RCA, and OM1. Lexiscan  Myoview  was ordered to rule out ischemia and it revealed no evidence of prior infarct or ischemia. Echo 07/18/2019 revealed LVEF 60 to 65%, no LVH, normal RV, normal valve function.     Follow-up office visit 01/12/2022 with increasing episodes of chest pain and shortness of breath and cardiac cath 01/21/2022 showed 20% proximal RCA and LAD with normal LVEDP DP and normal EF.  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  A biopsy for rectal discomfort was performed 01/09/2024 showing a 3 cm mass confirmed to be invasive SCC.   PET/CT showed stage III rectal cancer.  She was started on chemoradiation with 5-FU and mitomycin  on  01/31/2024.    She was admitted to the hospital 02/02/2024 with chest pain in the setting of hypertension.  She was started on aspirin  and nitroglycerin  and high sensitive troponin was elevated at 32.  Chest CT showed no evidence of PE.  ESR was 20.  2D echo showed EF 55 to 60% with no focal wall motion abnormalities and normal RV function.  She ultimately underwent cardiac cath 02/03/2024 showing 20% proximal LAD and 20% proximal RCA with otherwise normal coronary arteries and moderately elevated LVEDP.  Initially myocarditis was suspected and she was started on colchicine  0.6 mg twice daily.  Later there was suspicion that she may have developed coronary vasospasm in the setting of continuous 5-FU infusion and was referred to cardio oncology clinic by Andrea [name].    Andrea [name] saw her 02/22/2024.  He felt her presentation was not consistent with pericarditis but related to vasospasm from 5-FU.  He ultimately arranged for pretreatment with Imdur  30 mg twice daily at her next infusion.  Her Dyazide  was stopped to allow BP room to uptitrate nitrates.  Plan was to give Imdur  30 mg and 30 mg nifedipine 3 to 4 hours prior to treatment while holding her ARB in the hospital.  Then would give 30 mg Cardizem  1 hour prior to dosing and 12 hours after infusion would get an additional 30 mg of nifedipine and 30 mg of Imdur .  24 hours later would resume Imdur  30 mg twice daily.    She has now been admitted for 5-FU infusion.  She denies any chest pain or shortness of breath.  Past Medical History:  Diagnosis Date   Anemia    Anginal pain (HCC)    Arthritis    Chest discomfort    Disorder of immune system (HCC)    Dyspnea    Dyspnea on exertion    Hypercholesterolemia    Hypertension    Migraine    Non-ulcer dyspepsia    Osteopenia of spine    Peptic ulcer disease    Pneumonia    Polyp of rectum    cancerous   Pre-diabetes    Skin cancer    Ulcerative colitis (HCC)     Past Surgical History:   Procedure Laterality Date   ANKLE FRACTURE SURGERY Left    CARDIAC CATHETERIZATION     COLONOSCOPY     ESOPHAGOGASTRODUODENOSCOPY     hip replacement Bilateral    LEFT HEART CATH AND CORONARY ANGIOGRAPHY N/A 01/21/2022   Procedure: LEFT HEART CATH AND CORONARY ANGIOGRAPHY;  Surgeon: Andrea Benne, MD;  Location: [facility];  Service: Cardiovascular;  Laterality: N/A;   LEFT HEART CATH AND CORONARY ANGIOGRAPHY N/A 02/03/2024   Procedure: LEFT HEART CATH AND CORONARY ANGIOGRAPHY;  Surgeon: Andrea Hamilton, MD;  Location: [facility];  Service: Cardiovascular;  Laterality: N/A;   RECTAL BIOPSY N/A 05/04/2022   Procedure: BIOPSY RECTAL VS EXCISION OF PERIANAL LESION UNDER ANOSCOPY;  Surgeon: Andrea Stabs, MD;  Location: [facility];  Service: General;  Laterality: N/A;   TOTAL KNEE ARTHROPLASTY Left 10/19/2022   Procedure: LEFT TOTAL KNEE ARTHROPLASTY;  Surgeon: Andrea Hamman, MD;  Location: [facility];  Service: Orthopedics;  Laterality: Left;   TUBAL LIGATION     TUMOR EXCISION N/A 01/14/2024   Procedure: TRANSRECTAL TUMOR EXCISION;  Surgeon: Andrea Stabs, MD;  Location: [facility];  Service: General;  Laterality: N/A;  Transanal excision of distal rectal and anal lesion (wide local)     Home Medications:  Prior to Admission medications   Medication Sig Start Date End Date Taking? Authorizing Provider  acetaminophen  (TYLENOL ) 500 MG tablet Take 1,000 mg by mouth every 6 (six) hours as needed for moderate pain.   Yes [provider]  albuterol  (VENTOLIN  HFA) 108 (90 Base) MCG/ACT inhaler Inhale 1-2 puffs into the lungs every 4 (four) hours as needed for wheezing or shortness of breath. 06/20/23  Yes [name], Andrea Buzzard, NP  ammonium lactate (LAC-HYDRIN) 12 % lotion Apply 1 Application topically daily.   Yes [provider]  atorvastatin  (LIPITOR) 80 MG tablet TAKE ONE TABLET BY MOUTH DAILY Patient taking differently: Take 40 mg by  mouth daily. 02/04/23  Yes Andrea [name], Andrea Council, MD  colchicine  0.6 MG tablet Take 1 tablet (0.6 mg total) by mouth 2 (two) times daily. 02/04/24 03/05/24 Yes [name], Andrea A, MD  diltiazem  (CARDIZEM  CD) 180 MG 24 hr capsule Take 1 capsule (180 mg total) by mouth daily. 02/25/24 05/25/24 Yes Lauralee Poll, MD  diphenoxylate -atropine  (LOMOTIL ) 2.5-0.025 MG tablet Take 1-2 tablets by mouth 4 (four) times daily as needed for diarrhea or loose stools. 02/07/24  Yes Sonja [place], MD  ezetimibe  (ZETIA ) 10 MG tablet TAKE 1 TABLET BY MOUTH DAILY 04/13/23  Yes Powell Halbert, Andrea Council, MD  fluticasone  (FLONASE ) 50 MCG/ACT nasal spray Place 2 sprays into both nostrils daily. Patient taking differently: Place 2 sprays into both nostrils daily as needed for allergies. 11/26/17  Yes Yu, Amy V, PA-C  isosorbide  mononitrate (IMDUR ) 30 MG 24 hr tablet Take 1 tablet (30 mg total)  by mouth at bedtime. 02/25/24  Yes Lauralee Poll, MD  nitroGLYCERIN  (NITROSTAT ) 0.4 MG SL tablet Place 1 tablet (0.4 mg total) under the tongue every 5 (five) minutes as needed for chest pain. 10/29/23 02/21/25 Yes Jacqueline Matsu, MD  ondansetron  (ZOFRAN ) 8 MG tablet Take 1 tablet (8 mg total) by mouth every 8 (eight) hours as needed for nausea or vomiting. 01/24/24  Yes Sonja [place], MD  pantoprazole  (PROTONIX ) 40 MG tablet Take 1 tablet (40 mg total) by mouth 2 (two) times daily. 02/04/24 03/05/24 Yes [name], Andrea A, MD  PARoxetine  (PAXIL ) 20 MG tablet Take 10 mg by mouth daily.   Yes [provider]  SUMAtriptan (IMITREX) 25 MG tablet Take 25 mg by mouth every 2 (two) hours as needed for migraine.   Yes [provider]  vedolizumab  (ENTYVIO ) 300 MG injection Inject 300 mg into the vein every 8 (eight) weeks.   Yes [provider]  magic mouthwash (nystatin , lidocaine , diphenhydrAMINE , alum & mag hydroxide) suspension Swish and swallow 5 mLs by mouth 3 (three) times daily as needed for mouth pain. Patient not taking: Reported on  02/28/2024 02/08/24   Sonja [place], MD    Inpatient Medications: Scheduled Meds:  [START ON 02/29/2024] diltiazem   180 mg Oral Daily   enoxaparin  (LOVENOX ) injection  40 mg Subcutaneous Q24H   fluorouracil  (ADRUCIL ) 7,000 mg in dextrose  5 % 1,000 mL chemo infusion  7,000 mg Intravenous Once   [START ON 02/29/2024] isosorbide  mononitrate  30 mg Oral BID   mitoMYcin   10 mg/m2 (Treatment Plan Recorded) Intravenous Once   [START ON 02/29/2024] NIFEdipine  30 mg Oral Daily   prochlorperazine   10 mg Oral Once   Continuous Infusions:  sodium chloride      dextrose      PRN Meds: acetaminophen , alum & mag hydroxide-simeth, hydrocortisone , [DISCONTINUED] ondansetron  **OR** [DISCONTINUED] ondansetron  **OR** ondansetron  (ZOFRAN ) IV **OR** [DISCONTINUED] ondansetron  (ZOFRAN ) IV, senna-docusate  Allergies:    Allergies  Allergen Reactions   Codeine Nausea And Vomiting    Social History:   Social History   Socioeconomic History   Marital status: Divorced    Spouse name: Not on file   Number of children: 3   Years of education: Not on file   Highest education level: Not on file  Occupational History   Occupation: retired  Tobacco Use   Smoking status: Never   Smokeless tobacco: Never  Vaping Use   Vaping status: Never Used  Substance and Sexual Activity   Alcohol use: No   Drug use: No   Sexual activity: Yes  Other Topics Concern   Not on file  Social History Narrative   Not on file   Social Drivers of Health   Financial Resource Strain: Not on file  Food Insecurity: No Food Insecurity (02/03/2024)   Hunger Vital Sign    Worried About Running Out of Food in the Last Year: Never true    Ran Out of Food in the Last Year: Never true  Transportation Needs: No Transportation Needs (02/03/2024)   PRAPARE - Administrator, Civil Service (Medical): No    Lack of Transportation (Non-Medical): No  Physical Activity: Not on file  Stress: Not on file  Social Connections: Moderately  Integrated (02/03/2024)   Social Connection and Isolation Panel [NHANES]    Frequency of Communication with Friends and Family: More than three times a week    Frequency of Social Gatherings with Friends and Family: More than three times a  week    Attends Religious Services: More than 4 times per year    Active Member of Clubs or Organizations: Yes    Attends Banker Meetings: More than 4 times per year    Marital Status: Divorced  Intimate Partner Violence: Not At Risk (02/03/2024)   Humiliation, Afraid, Rape, and Kick questionnaire    Fear of Current or Ex-Partner: No    Emotionally Abused: No    Physically Abused: No    Sexually Abused: No    Family History:    Family History  Problem Relation Age of Onset   CAD Mother 29   Heart attack Mother    Heart attack Father 48   CAD Brother 46   Skin cancer Brother    Other Son        gluten sensitivity   Colon cancer Neg Hx    Esophageal cancer Neg Hx    Rectal cancer Neg Hx    Stomach cancer Neg Hx      ROS:  Please see the history of present illness.   All other ROS reviewed and negative.     Physical Exam/Data:   Vitals:   02/28/24 0959  BP: (!) 152/85  Pulse: 73  Resp: 16  Temp: (!) 97.3 F (36.3 C)  TempSrc: Axillary  SpO2: 97%   No intake or output data in the 24 hours ending 02/28/24 1300    02/22/2024    2:48 PM 02/22/2024   11:04 AM 02/16/2024   10:30 AM  Last 3 Weights  Weight (lbs) 168 lb 169 lb 9.6 oz 167 lb 3.2 oz  Weight (kg) 76.204 kg 76.93 kg 75.841 kg     There is no height or weight on file to calculate BMI.  General:  Well nourished, well developed, in no acute distress HEENT: normal Lymph: no adenopathy Neck: no JVD Endocrine:  No thryomegaly Vascular: No carotid bruits; FA pulses 2+ bilaterally without bruits  Cardiac:  normal S1, S2; RRR; no murmur  Lungs:  clear to auscultation bilaterally, no wheezing, rhonchi or rales  Abd: soft, nontender, no hepatomegaly  Ext: no  edema Musculoskeletal:  No deformities, BUE and BLE strength normal and equal Skin: warm and dry  Neuro:  CNs 2-12 intact, no focal abnormalities noted Psych:  Normal affect   EKG: No new EKG to review Telemetry:  Telemetry was personally reviewed and demonstrates: Normal sinus rhythm  Laboratory Data:  High Sensitivity Troponin:   Recent Labs  Lab 02/02/24 1103 02/02/24 1322 02/02/24 1956 02/02/24 2335 02/03/24 0904  TROPONINIHS 32* 27* 33* 43* 426*     Chemistry Recent Labs  Lab 02/22/24 1005 02/28/24 1200  NA 137 136  K 3.9 3.7  CL 105 105  CO2 25 22  GLUCOSE 109* 116*  BUN 23 17  CREATININE 0.99 0.76  CALCIUM  9.5 8.8*  GFRNONAA >60 >60  ANIONGAP 7 9    Recent Labs  Lab 02/22/24 1005 02/28/24 1200  PROT 7.0 6.3*  ALBUMIN 4.0 3.3*  AST 21 21  ALT 20 17  ALKPHOS 93 99  BILITOT 0.4 0.5   Hematology Recent Labs  Lab 02/22/24 1005 02/28/24 1200  WBC 4.5 4.3  RBC 3.73* 3.21*  HGB 10.7* 9.3*  HCT 30.6* 28.2*  MCV 82.0 87.9  MCH 28.7 29.0  MCHC 35.0 33.0  RDW 13.3 14.4  PLT 244 213   BNPNo results for input(s): "BNP", "PROBNP" in the last 168 hours.  DDimer No results for input(s): "DDIMER"  in the last 168 hours.   Radiology/Studies:  IR PICC PLACEMENT RIGHT >5 YRS INC IMG GUIDE Result Date: 02/28/2024 INDICATION: for chemotherapy 68 year old female with history of anal cancer. In need of durable venous access for chemotherapy. Double lumen peripherally inserted central catheter requested. EXAM: ULTRASOUND AND FLUOROSCOPIC GUIDED PICC LINE INSERTION MEDICATIONS: 2 mL 1% lidocaine  CONTRAST:  None FLUOROSCOPY: Radiation Exposure Index and estimated peak skin dose (PSD); Reference air kerma (RAK), 0.1 mGy. COMPLICATIONS: None immediate. TECHNIQUE: The procedure, risks, benefits, and alternatives were explained to the patient and informed written consent was obtained. A timeout was performed prior to the initiation of the procedure. The RIGHT upper extremity  was prepped with chlorhexidine  in a sterile fashion, and a sterile drape was applied covering the operative field. Maximum barrier sterile technique with sterile gowns and gloves were used for the procedure. A timeout was performed prior to the initiation of the procedure. Local anesthesia was provided with 1% lidocaine . Under direct ultrasound guidance, the RIGHT basilic vein was accessed with a micropuncture kit after the overlying soft tissues were anesthetized with 1% lidocaine . An ultrasound image was saved for documentation purposes. A guidewire was advanced to the level of the superior caval-atrial junction for measurement purposes and the PICC line was cut to length. A peel-away sheath was placed and a 32 cm, 5 [place], dual lumen was inserted to level of the superior caval-atrial junction. A post procedure spot fluoroscopic was obtained. The catheter easily aspirated and flushed and was sutured in place. A dressing was placed. The patient tolerated the procedure well without immediate post procedural complication. FINDINGS: After catheter placement, the tip lies within the superior cavoatrial junction. The catheter aspirates and flushes normally and is ready for immediate use. IMPRESSION: Successful ultrasound and fluoroscopic guided placement of a RIGHT basilic vein approach, 32 cm, 5 Fr , dual lumen PICC. The tip of the catheter is positioned at the superior cavo-atrial junction. The PICC line is ready for immediate use. Performed by: [name] PA-C Electronically Signed   By: Art Largo M.D.   On: 02/28/2024 10:11     Assessment and Plan:   # 5-FU induced coronary vasospasm #Coronary artery disease #Hyperlipidemia - Cath 02/03/2024 done for chest pain after 5-FU infusion showed mild nonobstructive disease with 20% proximal LAD/RCA - Chest pain at the time of cath was felt related to coronary vasospasm from 5-FU and not from myocarditis (ESR was normal) - Followed in cardio oncology clinic  by Andrea [name] - Now admitted for recurrent 5-FU infusion - Will follow the current guidelines outlined by Andrea [name] below as follows - While admitted: Will follow the following protocol from [facility].  - She already took her daily Cardizem  CD 180 mg this morning at 8 AM and will resume that tomorrow morning as BP allows - She has already received her Imdur  30 mg and nifedipine ER 30 mg at noon today as pretreatment - she will take diltiazem  30 mg at 2 PM today.   - 5-FU is scheduled to be given today at 1545.   - At 12 hours after her 5-FU infusion starts  (6/10 @4  AM okay to) she will be given nifedipine ER 30 mg and Imdur  30 mg  - At 24 hours after 5-FU resume Imdur  30mg  BID (6/10 @1600 ) - At 24 hours after 5-FU give nifedipine ER 30mg  (6/11 @0400 ).   #Hypertension - BP mildly elevated today at 152/85 mmHg - Continue Cardizem  CD 180 mg daily  Any questions or updates, please contact [facility] Please consult [email or web address] for contact info under    Signed, Andrea Keas, MD  02/28/2024 1:00 PM

## 2024-02-28 NOTE — Progress Notes (Addendum)
 Patient has HA 5/10 d/t IV chemo and she can have the next tylenol  based on her orders at 2130. She also doesn't have anything PRN for nausea if zofran  doesn't work. Paged Dr. Maria Shiner on call. New orders placed in EPIC for ibuprofen and compazine . Will give PO ibuprofen and will continue to monitor.

## 2024-02-28 NOTE — Procedures (Signed)
 Successful placement of dual lumen PICC line to right basilic vein. Length 32cm Tip at lower SVC/RA PICC capped No complications Ready for use.  EBL < 5 mL   Lillian Rein PA-C 02/28/2024 9:37 AM

## 2024-02-28 NOTE — Progress Notes (Signed)
 Discussed case with Dr. Maryalice Smaller and Dr. Micael Adas. Patient will be admitted for continuous 5FU infusion after recent episode of vasospasm.  Given need for continuous infusion patient will be high risk for recurrent vasospasm. Started on imdur  30mg  BID, had difficulty tolerating so reduced to 30mg  nightly and diltiazem  180mg  ER added.   During admission, would trial increase back to 30mg  BID of imdur  if tolerate. Would start nifedipine 30mg  ER and continue daily and continue diltiazem  180mg  ER.   Would obtain EKG and enzymes pre treatment. If patient were to develop chest pain would repeat EKG, enzymes, and start nitroglycerin  infusion. If hemodynamically unstable or chest pain persists may have to consider alternative or bolus regimen.

## 2024-02-28 NOTE — Plan of Care (Signed)
  Problem: Education: Goal: Knowledge of General Education information will improve Description: Including pain rating scale, medication(s)/side effects and non-pharmacologic comfort measures Outcome: Progressing   Problem: Health Behavior/Discharge Planning: Goal: Ability to manage health-related needs will improve Outcome: Progressing   Problem: Clinical Measurements: Goal: Ability to maintain clinical measurements within normal limits will improve Outcome: Progressing Goal: Diagnostic test results will improve Outcome: Progressing   Problem: Nutrition: Goal: Adequate nutrition will be maintained Outcome: Progressing   Problem: Coping: Goal: Level of anxiety will decrease Outcome: Progressing

## 2024-02-28 NOTE — H&P (Addendum)
 History and Physical   Andrea [name] [id number] 06-Jul-1956 68 y.o. 02/28/2024    HPI:  Andrea [name] is a 68 year old female patient with oncologic history significant for anal cancer.  She is now admitted for cycle 2 chemotherapy with 5-FU and mitomycin . Past medical history significant for anal squamous cell carcinoma diagnosed in 2023.  She is status post surgical resection.  PET scan done in May 2025 showed hypermetabolic activity with no distant mets.  She started concurrent chemoradiation May 2025.  Subsequently she was hospitalized for pericarditis due to clinical suspicion for 5-FU induced coronary artery spasm.  Due to this patient is being admitted for next cycle of chemotherapy with ongoing cardiac monitoring. Medical history also significant for anemia, arthritis, hypertension, hypercholesterolemia, ulcerative colitis, and pneumonia. Surgical history includes cardiac cath, ankle fracture with repair, total knee arthroplasty, and tubal ligation. Social history is noncontributory: Denies tobacco use, denies alcohol use, denies recreational or illicit drug use.   Past Medical History:  Diagnosis Date   Anemia    Anginal pain (HCC)    Arthritis    Chest discomfort    Disorder of immune system (HCC)    Dyspnea    Dyspnea on exertion    Hypercholesterolemia    Hypertension    Migraine    Non-ulcer dyspepsia    Osteopenia of spine    Peptic ulcer disease    Pneumonia    Polyp of rectum    cancerous   Pre-diabetes    Skin cancer    Ulcerative colitis (HCC)     Past Surgical History:  Procedure Laterality Date   ANKLE FRACTURE SURGERY Left    CARDIAC CATHETERIZATION     COLONOSCOPY     ESOPHAGOGASTRODUODENOSCOPY     hip replacement Bilateral    LEFT HEART CATH AND CORONARY ANGIOGRAPHY N/A 01/21/2022   Procedure: LEFT HEART CATH AND CORONARY ANGIOGRAPHY;  Surgeon: Odie Benne, MD;  Location: [facility];  Service: Cardiovascular;  Laterality: N/A;    LEFT HEART CATH AND CORONARY ANGIOGRAPHY N/A 02/03/2024   Procedure: LEFT HEART CATH AND CORONARY ANGIOGRAPHY;  Surgeon: Wenona Hamilton, MD;  Location: [facility];  Service: Cardiovascular;  Laterality: N/A;   RECTAL BIOPSY N/A 05/04/2022   Procedure: BIOPSY RECTAL VS EXCISION OF PERIANAL LESION UNDER ANOSCOPY;  Surgeon: Melvenia Stabs, MD;  Location: [facility];  Service: General;  Laterality: N/A;   TOTAL KNEE ARTHROPLASTY Left 10/19/2022   Procedure: LEFT TOTAL KNEE ARTHROPLASTY;  Surgeon: Wes Hamman, MD;  Location: [facility];  Service: Orthopedics;  Laterality: Left;   TUBAL LIGATION     TUMOR EXCISION N/A 01/14/2024   Procedure: TRANSRECTAL TUMOR EXCISION;  Surgeon: Melvenia Stabs, MD;  Location: [facility];  Service: General;  Laterality: N/A;  Transanal excision of distal rectal and anal lesion (wide local)    Allergies:  Allergies  Allergen Reactions   Codeine Nausea And Vomiting    Medications:  Medications Prior to Admission  Medication Sig Dispense Refill   acetaminophen  (TYLENOL ) 500 MG tablet Take 1,000 mg by mouth every 6 (six) hours as needed for moderate pain.     albuterol  (VENTOLIN  HFA) 108 (90 Base) MCG/ACT inhaler Inhale 1-2 puffs into the lungs every 4 (four) hours as needed for wheezing or shortness of breath. 8 g 0   ammonium lactate (LAC-HYDRIN) 12 % lotion Apply 1 Application topically daily.     atorvastatin  (LIPITOR) 80 MG tablet TAKE ONE TABLET  BY MOUTH DAILY (Patient taking differently: Take 40 mg by mouth daily.) 90 tablet 1   colchicine  0.6 MG tablet Take 1 tablet (0.6 mg total) by mouth 2 (two) times daily. 60 tablet 1   diltiazem  (CARDIZEM  CD) 180 MG 24 hr capsule Take 1 capsule (180 mg total) by mouth daily. 90 capsule 3   diphenoxylate -atropine  (LOMOTIL ) 2.5-0.025 MG tablet Take 1-2 tablets by mouth 4 (four) times daily as needed for diarrhea or loose stools. 60 tablet 0   ezetimibe  (ZETIA ) 10 MG tablet TAKE 1 TABLET BY  MOUTH DAILY 90 tablet 3   fluticasone  (FLONASE ) 50 MCG/ACT nasal spray Place 2 sprays into both nostrils daily. (Patient taking differently: Place 2 sprays into both nostrils daily as needed for allergies.) 1 g 0   isosorbide  mononitrate (IMDUR ) 30 MG 24 hr tablet Take 1 tablet (30 mg total) by mouth at bedtime.     nitroGLYCERIN  (NITROSTAT ) 0.4 MG SL tablet Place 1 tablet (0.4 mg total) under the tongue every 5 (five) minutes as needed for chest pain. 25 tablet 2   ondansetron  (ZOFRAN ) 8 MG tablet Take 1 tablet (8 mg total) by mouth every 8 (eight) hours as needed for nausea or vomiting. 30 tablet 1   pantoprazole  (PROTONIX ) 40 MG tablet Take 1 tablet (40 mg total) by mouth 2 (two) times daily. 60 tablet 0   PARoxetine  (PAXIL ) 20 MG tablet Take 10 mg by mouth daily.     SUMAtriptan (IMITREX) 25 MG tablet Take 25 mg by mouth every 2 (two) hours as needed for migraine.     vedolizumab  (ENTYVIO ) 300 MG injection Inject 300 mg into the vein every 8 (eight) weeks.     magic mouthwash (nystatin , lidocaine , diphenhydrAMINE , alum & mag hydroxide) suspension Swish and swallow 5 mLs by mouth 3 (three) times daily as needed for mouth pain. (Patient not taking: Reported on 02/28/2024) 140 mL 1    Social History:    reports that she has never smoked. She has never used smokeless tobacco. She reports that she does not drink alcohol and does not use drugs.  Family History:  Family History  Problem Relation Age of Onset   CAD Mother 15   Heart attack Mother    Heart attack Father 13   CAD Brother 70   Skin cancer Brother    Other Son        gluten sensitivity   Colon cancer Neg Hx    Esophageal cancer Neg Hx    Rectal cancer Neg Hx    Stomach cancer Neg Hx      PHYSICAL EXAMINATION: ECOG PERFORMANCE STATUS: 1 - Symptomatic but completely ambulatory  Vitals:   02/28/24 0959  BP: (!) 152/85  Pulse: 73  Resp: 16  Temp: (!) 97.3 F (36.3 C)  SpO2: 97%   There were no vitals filed for this  visit.  GENERAL: alert, no distress and comfortable SKIN: skin color, texture, turgor are normal, no rashes or significant lesions EYES: normal, conjunctiva are pink and non-injected, sclera clear OROPHARYNX: no exudate, no erythema and lips, buccal mucosa, and tongue normal  NECK: supple, thyroid normal size, non-tender, without nodularity LYMPH: no palpable lymphadenopathy in the cervical, axillary or inguinal LUNGS: clear to auscultation and percussion with normal breathing effort HEART: regular rate & rhythm and no murmurs and no lower extremity edema ABDOMEN: abdomen soft, non-tender and normal bowel sounds MUSCULOSKELETAL: no cyanosis of digits and no clubbing  PSYCH: alert & oriented x 3 with fluent  speech NEURO: no focal motor/sensory deficits   Lab Results:  Lab Results  Component Value Date   WBC 4.3 02/28/2024   HGB 9.3 (L) 02/28/2024   HCT 28.2 (L) 02/28/2024   MCV 87.9 02/28/2024   PLT 213 02/28/2024   NEUTROABS 3.1 02/28/2024        Radiological Studies:  IR PICC PLACEMENT RIGHT >5 YRS INC IMG GUIDE Result Date: 02/28/2024 INDICATION: for chemotherapy 67 year old female with history of anal cancer. In need of durable venous access for chemotherapy. Double lumen peripherally inserted central catheter requested. EXAM: ULTRASOUND AND FLUOROSCOPIC GUIDED PICC LINE INSERTION MEDICATIONS: 2 mL 1% lidocaine  CONTRAST:  None FLUOROSCOPY: Radiation Exposure Index and estimated peak skin dose (PSD); Reference air kerma (RAK), 0.1 mGy. COMPLICATIONS: None immediate. TECHNIQUE: The procedure, risks, benefits, and alternatives were explained to the patient and informed written consent was obtained. A timeout was performed prior to the initiation of the procedure. The RIGHT upper extremity was prepped with chlorhexidine  in a sterile fashion, and a sterile drape was applied covering the operative field. Maximum barrier sterile technique with sterile gowns and gloves were used for the  procedure. A timeout was performed prior to the initiation of the procedure. Local anesthesia was provided with 1% lidocaine . Under direct ultrasound guidance, the RIGHT basilic vein was accessed with a micropuncture kit after the overlying soft tissues were anesthetized with 1% lidocaine . An ultrasound image was saved for documentation purposes. A guidewire was advanced to the level of the superior caval-atrial junction for measurement purposes and the PICC line was cut to length. A peel-away sheath was placed and a 32 cm, 5 [place], dual lumen was inserted to level of the superior caval-atrial junction. A post procedure spot fluoroscopic was obtained. The catheter easily aspirated and flushed and was sutured in place. A dressing was placed. The patient tolerated the procedure well without immediate post procedural complication. FINDINGS: After catheter placement, the tip lies within the superior cavoatrial junction. The catheter aspirates and flushes normally and is ready for immediate use. IMPRESSION: Successful ultrasound and fluoroscopic guided placement of a RIGHT basilic vein approach, 32 cm, 5 Fr , dual lumen PICC. The tip of the catheter is positioned at the superior cavo-atrial junction. The PICC line is ready for immediate use. Performed by: [name] PA-C Electronically Signed   By: Art Largo M.D.   On: 02/28/2024 10:11     Assessment and Plan: Anal squamous cell carcinoma (HCC), [id number], stage IIIA Chest pain during chemotherapy -Patient with previous history of invasive squamous cell carcinoma of anus in 2023 - Status post surgical resection August 2023 - Status post recurrence with biopsy confirming invasive squamous cell carcinoma in April 2025 - Started concurrent chemoradiation with 5-FU and mitomycin  01/31/2024 - Likely vasospasm during 5-FU infusion during cycle 1 chemotherapy.  Cardio/heart failure Drs. Stone and Turner monitoring closely.   -Patient to receive cardiac  medications Imdur , nifedipine and diltiazem  as ordered, pre and post chemotherapy. - EKG as ordered -Continue radiation therapy as ordered - Plan PET scan 3 months postchemotherapy - Plan endoscopy 6 to 8 weeks posttreatment for rectal exam - Follows with medical oncology Dr. Maryalice Smaller.  Dr. Marton Sleeper is covering today.  Anemia - Mild - Hemoglobin 9.3 - Continue to monitor CBC with differential      The total time spent in the appointment was 60 minutes encounter with patient including review of chart and various tests results, discussions about plan of care and coordination of care plan  Annemarie Barry Saahil Herbster, NP  02/28/2024, 12:46 PM

## 2024-02-29 ENCOUNTER — Ambulatory Visit
Admission: RE | Admit: 2024-02-29 | Discharge: 2024-02-29 | Disposition: A | Discharge status: Home / Self Care | Source: Ambulatory Visit | Attending: Radiation Oncology

## 2024-02-29 ENCOUNTER — Other Ambulatory Visit: Payer: Self-pay

## 2024-02-29 ENCOUNTER — Encounter: Payer: Self-pay | Admitting: Hematology

## 2024-02-29 DIAGNOSIS — C210 Malignant neoplasm of anus, unspecified: Secondary | ICD-10-CM | POA: Diagnosis not present

## 2024-02-29 DIAGNOSIS — I10 Essential (primary) hypertension: Secondary | ICD-10-CM | POA: Diagnosis not present

## 2024-02-29 LAB — CBC WITH DIFFERENTIAL/PLATELET
Abs Immature Granulocytes: 0.01 10*3/uL (ref 0.00–0.07)
Basophils Absolute: 0 10*3/uL (ref 0.0–0.1)
Basophils Relative: 1 %
Eosinophils Absolute: 0.2 10*3/uL (ref 0.0–0.5)
Eosinophils Relative: 5 %
HCT: 25.4 % — ABNORMAL LOW (ref 36.0–46.0)
Hemoglobin: 8.5 g/dL — ABNORMAL LOW (ref 12.0–15.0)
Immature Granulocytes: 0 %
Lymphocytes Relative: 8 %
Lymphs Abs: 0.3 10*3/uL — ABNORMAL LOW (ref 0.7–4.0)
MCH: 28.8 pg (ref 26.0–34.0)
MCHC: 33.5 g/dL (ref 30.0–36.0)
MCV: 86.1 fL (ref 80.0–100.0)
Monocytes Absolute: 0.5 10*3/uL (ref 0.1–1.0)
Monocytes Relative: 16 %
Neutro Abs: 2.4 10*3/uL (ref 1.7–7.7)
Neutrophils Relative %: 70 %
Platelets: 167 10*3/uL (ref 150–400)
RBC: 2.95 MIL/uL — ABNORMAL LOW (ref 3.87–5.11)
RDW: 14.8 % (ref 11.5–15.5)
WBC: 3.4 10*3/uL — ABNORMAL LOW (ref 4.0–10.5)
nRBC: 0 % (ref 0.0–0.2)

## 2024-02-29 LAB — RAD ONC ARIA SESSION SUMMARY
Course Elapsed Days: 29
Plan Fractions Treated to Date: 18
Plan Prescribed Dose Per Fraction: 1.8 Gy
Plan Total Fractions Prescribed: 30
Plan Total Prescribed Dose: 54 Gy
Reference Point Dosage Given to Date: 32.4 Gy
Reference Point Session Dosage Given: 1.8 Gy
Session Number: 18

## 2024-02-29 LAB — COMPREHENSIVE METABOLIC PANEL WITH GFR
ALT: 16 U/L (ref 0–44)
AST: 20 U/L (ref 15–41)
Albumin: 2.9 g/dL — ABNORMAL LOW (ref 3.5–5.0)
Alkaline Phosphatase: 86 U/L (ref 38–126)
Anion gap: 4 — ABNORMAL LOW (ref 5–15)
BUN: 14 mg/dL (ref 8–23)
CO2: 24 mmol/L (ref 22–32)
Calcium: 8.3 mg/dL — ABNORMAL LOW (ref 8.9–10.3)
Chloride: 109 mmol/L (ref 98–111)
Creatinine, Ser: 0.97 mg/dL (ref 0.44–1.00)
GFR, Estimated: >60 mL/min (ref >60)
Glucose, Bld: 104 mg/dL — ABNORMAL HIGH (ref 70–99)
Potassium: 3.3 mmol/L — ABNORMAL LOW (ref 3.5–5.1)
Sodium: 137 mmol/L (ref 135–145)
Total Bilirubin: 0.7 mg/dL (ref 0.0–1.2)
Total Protein: 5.7 g/dL — ABNORMAL LOW (ref 6.5–8.1)

## 2024-02-29 NOTE — Progress Notes (Signed)
   02/29/24 1400  TOC Brief Assessment  Insurance and Status Reviewed (Medicare part A &B)  Patient has primary care physician Yes Anner Bars MD with [facility] physicians)  Home environment has been reviewed From home alone per patient  Prior level of function: Independent  Prior/Current Home Services No current home services  Social Drivers of Health Review SDOH reviewed no interventions necessary  Readmission risk has been reviewed Yes  Transition of care needs no transition of care needs at this time

## 2024-02-29 NOTE — Progress Notes (Addendum)
 Andrea [name]   DOB:17-Oct-1955   ZO#:[id number]      ASSESSMENT & PLAN:  Andrea [name] is a 68 year old female patient with oncologic history significant for anal cancer.  She is now admitted for cycle 2 chemotherapy with 5-FU and mitomycin  started on 02/28/24.   Anal squamous cell carcinoma (HCC), cT2N1M0, stage IIIA Chest pain during chemotherapy -Patient with previous history of invasive squamous cell carcinoma of anus in 2023 - Status post surgical resection August 2023 - Status post recurrence with biopsy confirming invasive squamous cell carcinoma in April 2025 - Started concurrent chemoradiation with 5-FU and mitomycin  01/31/2024 - Likely vasospasm during 5-FU infusion during C1 chemotherapy.   -- C2 chemo started 6/9 with close cardio monitoring.  Cardio/heart failure Drs. Stone and Turner monitoring.   - Continue cardiac medications Imdur , nifedipine and diltiazem  as ordered, pre and post chemotherapy. Tolerating well.  - EKG done 6/9 - Continue daily radiation therapy as ordered - Plan PET scan 3 months postchemotherapy - Plan endoscopy 6 to 8 weeks posttreatment for rectal exam - Follows with medical oncology Dr. Maryalice Smaller.  Dr. Scherrie Curt covering for this hospitalization.  Skin breakdown, perineum -- secondary to ongoing RT -- No creams per Rad Onc.   -- Continue to gently clean area with water using peri-bottle given to patient  -- Monitor closely   Anemia - Likely due to malignancy, oncologic treatments, and dilution effect - Hemoglobin 8.5 today -- Transfuse PRBC for HGB<7.  No transfusion at this time planned.  - Continue to monitor CBC with differential  Headaches -- Improved -- patient c/o headaches evening of 6/9.   -- Continue tylenol  alternating with Ibuprofen.  Code Status Full  Subjective:  Patient seen awake and alert sitting up in bed.  Son at bedside.  Reports no chest pain or discomfort.  States headaches from yesterday evening much better. No other acute  complaints.  Objective:   Intake/Output Summary (Last 24 hours) at 02/29/2024 1133 Last data filed at 02/29/2024 9811 Gross per 24 hour  Intake 613.54 ml  Output --  Net 613.54 ml     PHYSICAL EXAMINATION: ECOG PERFORMANCE STATUS: 1 - Symptomatic but completely ambulatory  Vitals:   02/28/24 1748 02/29/24 0606  BP: (!) 146/61 (!) 142/68  Pulse: 82 90  Resp: 16 16  Temp: 98 F (36.7 C) 97.8 F (36.6 C)  SpO2: 97% 96%   Filed Weights   02/28/24 1835  Weight: 172 lb (78 kg)    GENERAL: alert, no distress and comfortable SKIN: +perineum breakdown EYES: normal, conjunctiva are pink and non-injected, sclera clear OROPHARYNX: no exudate, no erythema and lips, buccal mucosa, and tongue normal  NECK: supple, thyroid normal size, non-tender, without nodularity LYMPH: no palpable lymphadenopathy in the cervical, axillary or inguinal LUNGS: clear to auscultation and percussion with normal breathing effort HEART: regular rate & rhythm and no murmurs and no lower extremity edema ABDOMEN: abdomen soft, non-tender and normal bowel sounds MUSCULOSKELETAL: no cyanosis of digits and no clubbing  PSYCH: alert & oriented x 3 with fluent speech NEURO: no focal motor/sensory deficits   All questions were answered. The patient knows to call the clinic with any problems, questions or concerns.   The total time spent in the appointment was 40 minutes encounter with patient including review of chart and various tests results, discussions about plan of care and coordination of care plan  Jacqualin Mate, NP 02/29/2024 11:33 AM    Labs Reviewed:  Lab Results  Component Value Date   WBC 3.4 (L) 02/29/2024   HGB 8.5 (L) 02/29/2024   HCT 25.4 (L) 02/29/2024   MCV 86.1 02/29/2024   PLT 167 02/29/2024   Recent Labs    02/22/24 1005 02/28/24 1200 02/29/24 0345  NA 137 136 137  K 3.9 3.7 3.3*  CL 105 105 109  CO2 25 22 24   GLUCOSE 109* 116* 104*  BUN 23 17 14   CREATININE 0.99 0.76  0.97  CALCIUM  9.5 8.8* 8.3*  GFRNONAA >60 >60 >60  PROT 7.0 6.3* 5.7*  ALBUMIN 4.0 3.3* 2.9*  AST 21 21 20   ALT 20 17 16   ALKPHOS 93 99 86  BILITOT 0.4 0.5 0.7    Studies Reviewed:  IR PICC PLACEMENT RIGHT >5 YRS INC IMG GUIDE Result Date: 02/28/2024 INDICATION: for chemotherapy 68 year old female with history of anal cancer. In need of durable venous access for chemotherapy. Double lumen peripherally inserted central catheter requested. EXAM: ULTRASOUND AND FLUOROSCOPIC GUIDED PICC LINE INSERTION MEDICATIONS: 2 mL 1% lidocaine  CONTRAST:  None FLUOROSCOPY: Radiation Exposure Index and estimated peak skin dose (PSD); Reference air kerma (RAK), 0.1 mGy. COMPLICATIONS: None immediate. TECHNIQUE: The procedure, risks, benefits, and alternatives were explained to the patient and informed written consent was obtained. A timeout was performed prior to the initiation of the procedure. The RIGHT upper extremity was prepped with chlorhexidine  in a sterile fashion, and a sterile drape was applied covering the operative field. Maximum barrier sterile technique with sterile gowns and gloves were used for the procedure. A timeout was performed prior to the initiation of the procedure. Local anesthesia was provided with 1% lidocaine . Under direct ultrasound guidance, the RIGHT basilic vein was accessed with a micropuncture kit after the overlying soft tissues were anesthetized with 1% lidocaine . An ultrasound image was saved for documentation purposes. A guidewire was advanced to the level of the superior caval-atrial junction for measurement purposes and the PICC line was cut to length. A peel-away sheath was placed and a 32 cm, 5 [place], dual lumen was inserted to level of the superior caval-atrial junction. A post procedure spot fluoroscopic was obtained. The catheter easily aspirated and flushed and was sutured in place. A dressing was placed. The patient tolerated the procedure well without immediate post  procedural complication. FINDINGS: After catheter placement, the tip lies within the superior cavoatrial junction. The catheter aspirates and flushes normally and is ready for immediate use. IMPRESSION: Successful ultrasound and fluoroscopic guided placement of a RIGHT basilic vein approach, 32 cm, 5 Fr , dual lumen PICC. The tip of the catheter is positioned at the superior cavo-atrial junction. The PICC line is ready for immediate use. Performed by: [name] PA-C Electronically Signed   By: Art Largo M.D.   On: 02/28/2024 10:11   VAS US  LOWER EXTREMITY VENOUS (DVT) Result Date: 02/06/2024  Lower Venous DVT Study Patient Name:  CELICA KOTOWSKI  Date of Exam:   02/04/2024 Medical Rec #: [id number]        Accession #:    [id number] Date of Birth: 1956-01-01         Patient Gender: F Patient Age:   79 years Exam Location:  [facility] Procedure:      VAS US  LOWER EXTREMITY VENOUS (DVT) Referring Phys: Clifford Dam REGALADO --------------------------------------------------------------------------------  Indications: Pain, and right upper thigh.  Risk Factors: Cancer. Anticoagulation: Lovenox . Comparison Study: 10/27/23 Performing Technologist: Franky Ivanoff Sturdivant-Jones RDMS, RVT  Examination Guidelines: A complete evaluation includes B-mode imaging, spectral  Doppler, color Doppler, and power Doppler as needed of all accessible portions of each vessel. Bilateral testing is considered an integral part of a complete examination. Limited examinations for reoccurring indications may be performed as noted. The reflux portion of the exam is performed with the patient in reverse Trendelenburg.  +---------+---------------+---------+-----------+----------+--------------+ RIGHT    CompressibilityPhasicitySpontaneityPropertiesThrombus Aging +---------+---------------+---------+-----------+----------+--------------+ CFV      Full           Yes                                           +---------+---------------+---------+-----------+----------+--------------+ SFJ      Full                                                        +---------+---------------+---------+-----------+----------+--------------+ FV Prox  Full                                                        +---------+---------------+---------+-----------+----------+--------------+ FV Mid   Full                                                        +---------+---------------+---------+-----------+----------+--------------+ FV DistalFull                                                        +---------+---------------+---------+-----------+----------+--------------+ PFV      Full                                                        +---------+---------------+---------+-----------+----------+--------------+ POP      Full           Yes                                          +---------+---------------+---------+-----------+----------+--------------+ PTV      Full                                                        +---------+---------------+---------+-----------+----------+--------------+ PERO     Full                                                        +---------+---------------+---------+-----------+----------+--------------+   +---------+---------------+---------+-----------+----------+--------------+  LEFT     CompressibilityPhasicitySpontaneityPropertiesThrombus Aging +---------+---------------+---------+-----------+----------+--------------+ CFV      Full           Yes      Yes                                 +---------+---------------+---------+-----------+----------+--------------+ SFJ      Full                                                        +---------+---------------+---------+-----------+----------+--------------+ FV Prox  Full                                                         +---------+---------------+---------+-----------+----------+--------------+ FV Mid   Full                                                        +---------+---------------+---------+-----------+----------+--------------+ FV DistalFull                                                        +---------+---------------+---------+-----------+----------+--------------+ PFV      Full                                                        +---------+---------------+---------+-----------+----------+--------------+ POP      Full           Yes      Yes                                 +---------+---------------+---------+-----------+----------+--------------+ PTV      Full                                                        +---------+---------------+---------+-----------+----------+--------------+ PERO     Full                                                        +---------+---------------+---------+-----------+----------+--------------+     Summary: BILATERAL: - No evidence of deep vein thrombosis seen in the lower extremities, bilaterally. -No evidence of popliteal cyst, bilaterally.   *See table(s) above for measurements and observations. Electronically signed by Runell Countryman on 02/06/2024 at 9:35:02 AM.  Final    EEG adult Result Date: 02/04/2024 Eleni Griffin, MD     02/04/2024  4:03 PM Routine EEG Report BINDI KLOMP is a 68 y.o. female with a history of myoclonus who is undergoing an EEG to evaluate for seizures. Report: This EEG was acquired with electrodes placed according to the International 10-20 electrode system (including Fp1, Fp2, F3, F4, C3, C4, P3, P4, O1, O2, T3, T4, T5, T6, A1, A2, Fz, Cz, Pz). The following electrodes were missing or displaced: none. The occipital dominant rhythm was 5-7 Hz. This activity is reactive to stimulation. Drowsiness was manifested by background fragmentation; deeper stages of sleep were identified by K complexes and sleep  spindles. There was no focal slowing. There were no interictal epileptiform discharges. There were no electrographic seizures identified. There was no abnormal response to photic stimulation or hyperventilation. Impression and clinical correlation: This EEG was obtained while awake and asleep and is abnormal due to mild-to-moderate diffuse slowing indicative of global cerebral dysfunction. Epileptiform abnormalities were not seen during this recording. Greg Leaks, MD [facility] [phone] If 7pm- 7am, please page neurology on call as listed in AMION.   CARDIAC CATHETERIZATION Result Date: 02/03/2024   Prox LAD lesion is 20% stenosed.   Prox RCA lesion is 20% stenosed. 1.  Mild nonobstructive coronary artery disease. 2.  Left ventricular angiography was not performed.  EF was normal by echo.  Moderately elevated left ventricular end-diastolic pressure at 26 mmHg. Recommendations: No culprit is identified for chest pain and elevated troponin.  Suspect myopericarditis based on symptoms and EKG changes. Colchicine  0.6 mg twice daily was added. I discontinued heparin  drip.   ECHOCARDIOGRAM COMPLETE Result Date: 02/03/2024    ECHOCARDIOGRAM REPORT   Patient Name:   OLIVEAH ZWACK Date of Exam: 02/03/2024 Medical Rec #:  [id number]       Height:       65.0 in Accession #:    [id number]      Weight:       172.0 lb Date of Birth:  05-16-1956        BSA:          1.855 m Patient Age:    68 years        BP:           110/63 mmHg Patient Gender: F               HR:           70 bpm. Exam Location:  Inpatient Procedure: 2D Echo, Color Doppler, Cardiac Doppler and Intracardiac            Opacification Agent (Both Spectral and Color Flow Doppler were            utilized during procedure). Indications:    Chest Pain  History:        Patient has prior history of Echocardiogram examinations.                 Signs/Symptoms:Dyspnea; Risk Factors:Hypertension.  Sonographer:    Willey Harrier Referring Phys: [id number]  ZANE ADAMS  Sonographer Comments: Technically difficult study due to poor echo windows. IMPRESSIONS  1. Left ventricular ejection fraction, by estimation, is 55 to 60%. The left ventricle has normal function. The left ventricle has no regional wall motion abnormalities. Left ventricular diastolic parameters were normal.  2. Right ventricular systolic function is normal. The right ventricular size is normal.  3. The mitral valve is degenerative. Trivial mitral valve  regurgitation. No evidence of mitral stenosis.  4. The aortic valve is tricuspid. Aortic valve regurgitation is not visualized. Aortic valve sclerosis/calcification is present, without any evidence of aortic stenosis. Aortic valve Vmax measures 1.35 m/s.  5. The inferior vena cava is normal in size with greater than 50% respiratory variability, suggesting right atrial pressure of 3 mmHg. FINDINGS  Left Ventricle: Left ventricular ejection fraction, by estimation, is 55 to 60%. The left ventricle has normal function. The left ventricle has no regional wall motion abnormalities. Definity  contrast agent was given IV to delineate the left ventricular  endocardial borders. The left ventricular internal cavity size was normal in size. There is no left ventricular hypertrophy. Left ventricular diastolic parameters were normal. Normal left ventricular filling pressure. Right Ventricle: The right ventricular size is normal. No increase in right ventricular wall thickness. Right ventricular systolic function is normal. Left Atrium: Left atrial size was normal in size. Right Atrium: Right atrial size was normal in size. Pericardium: There is no evidence of pericardial effusion. Mitral Valve: The mitral valve is degenerative in appearance. There is mild calcification of the mitral valve leaflet(s). Mild mitral annular calcification. Trivial mitral valve regurgitation. No evidence of mitral valve stenosis. MV peak gradient, 4.3 mmHg. The mean mitral valve gradient is  2.0 mmHg. Tricuspid Valve: The tricuspid valve is normal in structure. Tricuspid valve regurgitation is not demonstrated. No evidence of tricuspid stenosis. Aortic Valve: The aortic valve is tricuspid. Aortic valve regurgitation is not visualized. Aortic valve sclerosis/calcification is present, without any evidence of aortic stenosis. Aortic valve peak gradient measures 7.3 mmHg. Pulmonic Valve: The pulmonic valve was normal in structure. Pulmonic valve regurgitation is not visualized. No evidence of pulmonic stenosis. Aorta: The aortic root is normal in size and structure. Venous: The inferior vena cava is normal in size with greater than 50% respiratory variability, suggesting right atrial pressure of 3 mmHg. IAS/Shunts: The interatrial septum appears to be lipomatous. No atrial level shunt detected by color flow Doppler.  LEFT VENTRICLE PLAX 2D LVIDd:         4.20 cm   Diastology LVIDs:         3.20 cm   LV e' medial:    8.27 cm/s LV PW:         0.80 cm   LV E/e' medial:  9.1 LV IVS:        0.90 cm   LV e' lateral:   7.29 cm/s LVOT diam:     2.00 cm   LV E/e' lateral: 10.3 LV SV:         64 LV SV Index:   35 LVOT Area:     3.14 cm  RIGHT VENTRICLE            IVC RV S prime:     9.25 cm/s  IVC diam: 2.50 cm TAPSE (M-mode): 2.0 cm LEFT ATRIUM             Index        RIGHT ATRIUM          Index LA Vol (A2C):   37.0 ml 19.94 ml/m  RA Area:     9.78 cm LA Vol (A4C):   26.9 ml 14.50 ml/m  RA Volume:   20.70 ml 11.16 ml/m LA Biplane Vol: 32.0 ml 17.25 ml/m  AORTIC VALVE AV Area (Vmax): 1.96 cm AV Vmax:        135.00 cm/s AV Peak Grad:   7.3 mmHg LVOT Vmax:  84.20 cm/s LVOT Vmean:     63.800 cm/s LVOT VTI:       0.204 m  AORTA Ao Root diam: 2.60 cm Ao Asc diam:  2.80 cm MITRAL VALVE MV Area (PHT): 3.05 cm    SHUNTS MV Area VTI:   2.38 cm    Systemic VTI:  0.20 m MV Peak grad:  4.3 mmHg    Systemic Diam: 2.00 cm MV Mean grad:  2.0 mmHg MV Vmax:       1.04 m/s MV Vmean:      59.2 cm/s MV Decel Time: 249 msec  MV E velocity: 75.20 cm/s MV A velocity: 95.30 cm/s MV E/A ratio:  0.79 Gaylyn Keas MD Electronically signed by Gaylyn Keas MD Signature Date/Time: 02/03/2024/1:28:23 PM    Final    DG Chest Port 1 View Result Date: 02/02/2024 CLINICAL DATA:  Status post PICC line placement. EXAM: PORTABLE CHEST 1 VIEW COMPARISON:  June 20, 2023 FINDINGS: A right-sided PICC line is seen with its distal tip approximately 1.8 cm distal to the junction of the superior vena cava and right atrium. The heart size and mediastinal contours are within normal limits. There is moderate severity calcification of the aortic arch. Stable mild to moderate severity biapical scarring and/or atelectasis is seen. No pleural effusion or pneumothorax is identified. The visualized skeletal structures are unremarkable. IMPRESSION: Right-sided PICC line positioning, as described above. Electronically Signed   By: Virgle Grime M.D.   On: 02/02/2024 23:24   CT Angio Chest PE W and/or Wo Contrast Result Date: 02/02/2024 CLINICAL DATA:  Chest pain. EXAM: CT ANGIOGRAPHY CHEST WITH CONTRAST TECHNIQUE: Multidetector CT imaging of the chest was performed using the standard protocol during bolus administration of intravenous contrast. Multiplanar CT image reconstructions and MIPs were obtained to evaluate the vascular anatomy. RADIATION DOSE REDUCTION: This exam was performed according to the departmental dose-optimization program which includes automated exposure control, adjustment of the mA and/or kV according to patient size and/or use of iterative reconstruction technique. CONTRAST:  65mL OMNIPAQUE  IOHEXOL  350 MG/ML SOLN COMPARISON:  Jan 25, 2024. FINDINGS: Cardiovascular: Satisfactory opacification of the pulmonary arteries to the segmental level. No evidence of pulmonary embolism. Normal heart size. No pericardial effusion. Mediastinum/Nodes: No enlarged mediastinal, hilar, or axillary lymph nodes. Thyroid gland, trachea, and esophagus  demonstrate no significant findings. Lungs/Pleura: No pneumothorax or pleural effusion is noted. Mild biapical scarring is noted. Upper Abdomen: No acute abnormality. Musculoskeletal: No chest wall abnormality. No acute or significant osseous findings. Review of the MIP images confirms the above findings. IMPRESSION: No definite evidence of pulmonary embolus. Electronically Signed   By: Rosalene Colon M.D.   On: 02/02/2024 13:14   IR PICC PLACEMENT LEFT >5 YRS INC IMG GUIDE Result Date: 01/31/2024 INDICATION: Patient with history of anal cancer; central venous access requested for chemotherapy EXAM: RIGHT UPPER EXTREMITY PICC LINE PLACEMENT WITH ULTRASOUND AND FLUOROSCOPIC GUIDANCE MEDICATIONS: 4 mL 1% lidocaine  to skin and subcutaneous tissue ANESTHESIA/SEDATION: None FLUOROSCOPY: Radiation Exposure Index (as provided by the fluoroscopic device): 1 mGy Kerma COMPLICATIONS: None immediate. PROCEDURE: The patient was advised of the possible risks and complications and agreed to undergo the procedure. The patient was then brought to the angiographic suite for the procedure. The right arm was prepped with chlorhexidine , draped in the usual sterile fashion using maximum barrier technique (cap and mask, sterile gown, sterile gloves, large sterile sheet, hand hygiene and cutaneous antisepsis) and infiltrated locally with 1% Lidocaine . Ultrasound demonstrated patency of the right  basilic vein, and this was documented with an image. Under real-time ultrasound guidance, this vein was accessed with a 21 gauge micropuncture needle and image documentation was performed. A 0.018 wire was introduced in to the vein. Over this, a 5 [place] dual lumen power injectable PICC was advanced to the lower SVC/right atrial junction. Fluoroscopy during the procedure and fluoro spot radiograph confirms appropriate catheter position. The catheter was flushed and covered with a sterile dressing. Catheter length: 40 cm IMPRESSION: Successful  RIGHT arm power PICC line placement with ultrasound and fluoroscopic guidance. The tip of the catheter is positioned at the superior cavo-atrial junction. the catheter is ready for use. Performed by: Wash Hack Electronically Signed   By: Art Largo M.D.   On: 01/31/2024 11:53  Ms. Topor was interviewed and examined.  She has anal cancer, currently being treated with radiation and 5-FU/Mitomycin -C.  She developed coronary vasospasm during cycle one 5-FU.  She is now admitted electively for cycle two 5-FU/Mitomycin -C.  She was premedicated with a cardiac regimen and is followed by cardiology.  Cycle two 5-FU/Mitomycin -C was initiated today afternoon.  She denies chest pain.  She appears well.  She has early skin breakdown at the perineum.  The plan is to continue the 5-FU infusion with the prophylactic medical regimen recommended by cardiology.

## 2024-02-29 NOTE — Plan of Care (Signed)
   Problem: Education: Goal: Knowledge of General Education information will improve Description: Including pain rating scale, medication(s)/side effects and non-pharmacologic comfort measures Outcome: Progressing   Problem: Health Behavior/Discharge Planning: Goal: Ability to manage health-related needs will improve Outcome: Progressing   Problem: Clinical Measurements: Goal: Ability to maintain clinical measurements within normal limits will improve Outcome: Progressing Goal: Will remain free from infection Outcome: Progressing Goal: Diagnostic test results will improve Outcome: Progressing Goal: Respiratory complications will improve Outcome: Progressing Goal: Cardiovascular complication will be avoided Outcome: Progressing   Problem: Activity: Goal: Risk for activity intolerance will decrease Outcome: Progressing   Problem: Nutrition: Goal: Adequate nutrition will be maintained Outcome: Progressing   Problem: Coping: Goal: Level of anxiety will decrease Outcome: Progressing   Problem: Elimination: Goal: Will not experience complications related to bowel motility Outcome: Progressing Goal: Will not experience complications related to urinary retention Outcome: Progressing   Problem: Pain Managment: Goal: General experience of comfort will improve and/or be controlled Outcome: Progressing   Problem: Safety: Goal: Ability to remain free from injury will improve Outcome: Progressing   Problem: Skin Integrity: Goal: Risk for impaired skin integrity will decrease Outcome: Progressing   Problem: Education: Goal: Knowledge of the prescribed therapeutic regimen will improve Outcome: Progressing   Problem: Activity: Goal: Ability to implement measures to reduce episodes of fatigue will improve Outcome: Progressing   Problem: Bowel/Gastric: Goal: Will not experience complications related to bowel motility Outcome: Progressing   Problem: Coping: Goal: Ability to  identify and develop effective coping behavior will improve Outcome: Progressing   Problem: Nutritional: Goal: Maintenance of adequate nutrition will improve Outcome: Progressing

## 2024-02-29 NOTE — Progress Notes (Signed)
 Progress Note  Patient Name: Andrea [name] Date of Encounter: 02/29/2024  [facility] Cardiologist: Gaylyn Keas, MD   Patient Profile     Subjective   Feeling good this am. No CP after 5FU infusion.    Inpatient Medications    Scheduled Meds:  Chlorhexidine  Gluconate Cloth  6 each Topical Daily   diltiazem   180 mg Oral Daily   docusate sodium   100 mg Oral Daily   enoxaparin  (LOVENOX ) injection  40 mg Subcutaneous Q24H   fluorouracil  (ADRUCIL ) 7,000 mg in dextrose  5 % 1,000 mL chemo infusion  7,000 mg Intravenous Once   isosorbide  mononitrate  30 mg Oral BID   NIFEdipine  30 mg Oral Daily   sodium chloride  flush  10-40 mL Intracatheter Q12H   Continuous Infusions:  sodium chloride  10 mL/hr at 02/29/24 0307   dextrose  10 mL/hr at 02/29/24 0307   PRN Meds: acetaminophen , alum & mag hydroxide-simeth, hydrocortisone , ibuprofen, [DISCONTINUED] ondansetron  **OR** [DISCONTINUED] ondansetron  **OR** ondansetron  (ZOFRAN ) IV **OR** [DISCONTINUED] ondansetron  (ZOFRAN ) IV, mouth rinse, prochlorperazine , senna-docusate, sodium chloride  flush   Vital Signs    Vitals:   02/28/24 0959 02/28/24 1748 02/28/24 1835 02/29/24 0606  BP: (!) 152/85 (!) 146/61  (!) 142/68  Pulse: 73 82  90  Resp: 16 16  16   Temp: (!) 97.3 F (36.3 C) 98 F (36.7 C)  97.8 F (36.6 C)  TempSrc: Axillary Oral  Oral  SpO2: 97% 97%  96%  Weight:   78 kg   Height:   5\' 5"  (1.651 m)     Intake/Output Summary (Last 24 hours) at 02/29/2024 1107 Last data filed at 02/29/2024 0307 Gross per 24 hour  Intake 613.54 ml  Output --  Net 613.54 ml      02/28/2024    6:35 PM 02/28/2024   10:30 AM 02/22/2024    2:48 PM  Last 3 Weights  Weight (lbs) 172 lb 172 lb 168 lb  Weight (kg) 78.019 kg 78.019 kg 76.204 kg      Telemetry    NSR - Personally Reviewed  ECG    No new EKG to review - Personally Reviewed  Physical Exam   GEN: No acute distress.   Neck: No JVD Cardiac: RRR, no murmurs, rubs, or  gallops.  Respiratory: Clear to auscultation bilaterally. GI: Soft, nontender, non-distended  MS: No edema; No deformity. Neuro:  Nonfocal  Psych: Normal affect   Labs    High Sensitivity Troponin:   Recent Labs  Lab 02/02/24 1322 02/02/24 1956 02/02/24 2335 02/03/24 0904 02/28/24 1525  TROPONINIHS 27* 33* 43* 426* 6      Chemistry Recent Labs  Lab 02/28/24 1200 02/29/24 0345  NA 136 137  K 3.7 3.3*  CL 105 109  CO2 22 24  GLUCOSE 116* 104*  BUN 17 14  CREATININE 0.76 0.97  CALCIUM  8.8* 8.3*  PROT 6.3* 5.7*  ALBUMIN 3.3* 2.9*  AST 21 20  ALT 17 16  ALKPHOS 99 86  BILITOT 0.5 0.7  GFRNONAA >60 >60  ANIONGAP 9 4*     Hematology Recent Labs  Lab 02/28/24 1200 02/29/24 0345  WBC 4.3 3.4*  RBC 3.21* 2.95*  HGB 9.3* 8.5*  HCT 28.2* 25.4*  MCV 87.9 86.1  MCH 29.0 28.8  MCHC 33.0 33.5  RDW 14.4 14.8  PLT 213 167    BNPNo results for input(s): "BNP", "PROBNP" in the last 168 hours.   DDimer No results for input(s): "DDIMER" in the last 168  hours.   Radiology    IR PICC PLACEMENT RIGHT >5 YRS INC IMG GUIDE Result Date: 02/28/2024 INDICATION: for chemotherapy 68 year old female with history of anal cancer. In need of durable venous access for chemotherapy. Double lumen peripherally inserted central catheter requested. EXAM: ULTRASOUND AND FLUOROSCOPIC GUIDED PICC LINE INSERTION MEDICATIONS: 2 mL 1% lidocaine  CONTRAST:  None FLUOROSCOPY: Radiation Exposure Index and estimated peak skin dose (PSD); Reference air kerma (RAK), 0.1 mGy. COMPLICATIONS: None immediate. TECHNIQUE: The procedure, risks, benefits, and alternatives were explained to the patient and informed written consent was obtained. A timeout was performed prior to the initiation of the procedure. The RIGHT upper extremity was prepped with chlorhexidine  in a sterile fashion, and a sterile drape was applied covering the operative field. Maximum barrier sterile technique with sterile gowns and gloves  were used for the procedure. A timeout was performed prior to the initiation of the procedure. Local anesthesia was provided with 1% lidocaine . Under direct ultrasound guidance, the RIGHT basilic vein was accessed with a micropuncture kit after the overlying soft tissues were anesthetized with 1% lidocaine . An ultrasound image was saved for documentation purposes. A guidewire was advanced to the level of the superior caval-atrial junction for measurement purposes and the PICC line was cut to length. A peel-away sheath was placed and a 32 cm, 5 [place], dual lumen was inserted to level of the superior caval-atrial junction. A post procedure spot fluoroscopic was obtained. The catheter easily aspirated and flushed and was sutured in place. A dressing was placed. The patient tolerated the procedure well without immediate post procedural complication. FINDINGS: After catheter placement, the tip lies within the superior cavoatrial junction. The catheter aspirates and flushes normally and is ready for immediate use. IMPRESSION: Successful ultrasound and fluoroscopic guided placement of a RIGHT basilic vein approach, 32 cm, 5 Fr , dual lumen PICC. The tip of the catheter is positioned at the superior cavo-atrial junction. The PICC line is ready for immediate use. Performed by: Andrea Matthews PA-C Electronically Signed   By: Art Largo M.D.   On: 02/28/2024 10:11    Patient Profile     68 y.o. female  with a hx of ASCAD (Coronary CTA revealed calcium  score of 190, 89th percentile for age and sex matched control, mild nonobstructive CAD in the proximal portion of LAD, RCA, and OM1), hypertension, hyperlipidemia, ulcerative colitis, peptic ulcer disease and anal cancer Who is being seen today for the evaluation to follow patient while undergoing chemotherapy with 5-FU for her anal carcinoma at the request of Arta Lark, MD and Dr. Marton Sleeper.   Assessment & Plan    # 5-FU induced coronary vasospasm #Coronary  artery disease #Hyperlipidemia - Cath 02/03/2024 done for chest pain after 5-FU infusion showed mild nonobstructive disease with 20% proximal LAD/RCA - Chest pain at the time of cath was felt related to coronary vasospasm from 5-FU and not from myocarditis (ESR was normal) - Followed in cardio oncology clinic by Dr. Alease Amend - Now admitted for recurrent 5-FU infusion - While admitted: Will follow the following protocol from [facility].  - she did well with her 5FU infusion with no CP - Got her Nifedipine ER 30mg  and Imdur  30mg  12 hours after her 5-FU infusion earlier this am @4  AM  - Will resume Imdur  30mg  BID (today at 1600) - At 24 hours after 5-FU give nifedipine ER 30mg  daily (today @ 1600) - continue Cardizem  CD 180mg  daily   #Hypertension - BP controlled at 142/48mmHg -  Continue Cardizem  CD 180 mg daily - also starting Nifedipine ER 30mg  daily  Doing well.  Will follow at a distance.  If she continues to have HAs may need to lower dose of Imdur       For questions or updates, please contact [facility] Please consult [email or web address] for contact info under        Signed, Gaylyn Keas, MD  02/29/2024, 11:07 AM

## 2024-03-01 ENCOUNTER — Other Ambulatory Visit: Payer: Self-pay

## 2024-03-01 ENCOUNTER — Ambulatory Visit
Admission: RE | Admit: 2024-03-01 | Discharge: 2024-03-01 | Disposition: A | Discharge status: Home / Self Care | Source: Ambulatory Visit | Attending: Radiation Oncology | Admitting: Radiation Oncology

## 2024-03-01 ENCOUNTER — Encounter: Payer: Self-pay | Admitting: Hematology

## 2024-03-01 DIAGNOSIS — C210 Malignant neoplasm of anus, unspecified: Secondary | ICD-10-CM | POA: Diagnosis not present

## 2024-03-01 LAB — CBC WITH DIFFERENTIAL/PLATELET
Abs Immature Granulocytes: 0.02 10*3/uL (ref 0.00–0.07)
Basophils Absolute: 0 10*3/uL (ref 0.0–0.1)
Basophils Relative: 1 %
Eosinophils Absolute: 0.1 10*3/uL (ref 0.0–0.5)
Eosinophils Relative: 2 %
HCT: 28.2 % — ABNORMAL LOW (ref 36.0–46.0)
Hemoglobin: 9.4 g/dL — ABNORMAL LOW (ref 12.0–15.0)
Immature Granulocytes: 1 %
Lymphocytes Relative: 6 %
Lymphs Abs: 0.2 10*3/uL — ABNORMAL LOW (ref 0.7–4.0)
MCH: 29.5 pg (ref 26.0–34.0)
MCHC: 33.3 g/dL (ref 30.0–36.0)
MCV: 88.4 fL (ref 80.0–100.0)
Monocytes Absolute: 0.5 10*3/uL (ref 0.1–1.0)
Monocytes Relative: 14 %
Neutro Abs: 2.9 10*3/uL (ref 1.7–7.7)
Neutrophils Relative %: 76 %
Platelets: 176 10*3/uL (ref 150–400)
RBC: 3.19 MIL/uL — ABNORMAL LOW (ref 3.87–5.11)
RDW: 15 % (ref 11.5–15.5)
WBC: 3.8 10*3/uL — ABNORMAL LOW (ref 4.0–10.5)
nRBC: 0 % (ref 0.0–0.2)

## 2024-03-01 LAB — RAD ONC ARIA SESSION SUMMARY
Course Elapsed Days: 30
Plan Fractions Treated to Date: 19
Plan Prescribed Dose Per Fraction: 1.8 Gy
Plan Total Fractions Prescribed: 30
Plan Total Prescribed Dose: 54 Gy
Reference Point Dosage Given to Date: 34.2 Gy
Reference Point Session Dosage Given: 1.8 Gy
Session Number: 19

## 2024-03-01 LAB — COMPREHENSIVE METABOLIC PANEL WITH GFR
ALT: 16 U/L (ref 0–44)
AST: 19 U/L (ref 15–41)
Albumin: 2.9 g/dL — ABNORMAL LOW (ref 3.5–5.0)
Alkaline Phosphatase: 57 U/L (ref 38–126)
Anion gap: 6 (ref 5–15)
BUN: 9 mg/dL (ref 8–23)
CO2: 26 mmol/L (ref 22–32)
Calcium: 8.9 mg/dL (ref 8.9–10.3)
Chloride: 108 mmol/L (ref 98–111)
Creatinine, Ser: 0.65 mg/dL (ref 0.44–1.00)
GFR, Estimated: >60 mL/min (ref >60)
Glucose, Bld: 114 mg/dL — ABNORMAL HIGH (ref 70–99)
Potassium: 3.3 mmol/L — ABNORMAL LOW (ref 3.5–5.1)
Sodium: 140 mmol/L (ref 135–145)
Total Bilirubin: 0.6 mg/dL (ref 0.0–1.2)
Total Protein: 5.7 g/dL — ABNORMAL LOW (ref 6.5–8.1)

## 2024-03-01 MED ORDER — NIFEDIPINE ER OSMOTIC RELEASE 60 MG PO TB24
60.0000 mg | ORAL_TABLET | Freq: Every day | ORAL | Status: DC
  Administered 2024-03-01: 60 mg via ORAL
  Filled 2024-03-01 (×2): qty 1

## 2024-03-01 MED ORDER — PAROXETINE HCL 10 MG PO TABS
10.0000 mg | ORAL_TABLET | Freq: Every day | ORAL | Status: DC
  Administered 2024-03-01 – 2024-03-03 (×3): 10 mg via ORAL
  Filled 2024-03-01 (×3): qty 1

## 2024-03-01 MED ORDER — ISOSORBIDE MONONITRATE ER 30 MG PO TB24
15.0000 mg | ORAL_TABLET | Freq: Two times a day (BID) | ORAL | Status: DC
  Administered 2024-03-01 – 2024-03-02 (×2): 15 mg via ORAL
  Filled 2024-03-01 (×2): qty 1

## 2024-03-01 MED ORDER — POTASSIUM CHLORIDE CRYS ER 20 MEQ PO TBCR
20.0000 meq | EXTENDED_RELEASE_TABLET | Freq: Every day | ORAL | Status: DC
  Administered 2024-03-01 – 2024-03-03 (×3): 20 meq via ORAL
  Filled 2024-03-01 (×3): qty 1

## 2024-03-01 NOTE — Progress Notes (Signed)
 Called about patient having HAs on Imdur  30mg  BID.  Will decrease dose to 15mg  BID.  BP elevated so increase Nifedipine XL to 60mg  daily.

## 2024-03-01 NOTE — Progress Notes (Signed)
 Mobility Specialist - Progress Note   03/01/24 1316  Mobility  Activity Ambulated independently in hallway  Level of Assistance Independent  Assistive Device None  Distance Ambulated (ft) 500 ft  Activity Response Tolerated well  Mobility Referral Yes  Mobility visit 1 Mobility  Mobility Specialist Start Time (ACUTE ONLY) 1117  Mobility Specialist Stop Time (ACUTE ONLY) 1125  Mobility Specialist Time Calculation (min) (ACUTE ONLY) 8 min   Pt received in bed and agreeable to mobility. No complaints during session. Pt to bed after session with all needs met.    [facility]

## 2024-03-01 NOTE — Plan of Care (Signed)
  Problem: Education: Goal: Knowledge of General Education information will improve Description: Including pain rating scale, medication(s)/side effects and non-pharmacologic comfort measures Outcome: Progressing   Problem: Activity: Goal: Risk for activity intolerance will decrease Outcome: Progressing   Problem: Coping: Goal: Level of anxiety will decrease Outcome: Progressing   Problem: Elimination: Goal: Will not experience complications related to bowel motility Outcome: Progressing   Problem: Pain Managment: Goal: General experience of comfort will improve and/or be controlled Outcome: Progressing   Problem: Safety: Goal: Ability to remain free from injury will improve Outcome: Progressing   Problem: Skin Integrity: Goal: Risk for impaired skin integrity will decrease Outcome: Progressing   Problem: Activity: Goal: Ability to implement measures to reduce episodes of fatigue will improve Outcome: Progressing

## 2024-03-01 NOTE — Progress Notes (Addendum)
 SHYLIN KEIZER   DOB:[birth year]   ZO#:[id number]      ASSESSMENT & PLAN:  Andrea [name] is a 68 year old female patient with oncologic history significant for anal cancer.  She is now admitted for cycle 2 chemotherapy with 5-FU and mitomycin  started on 02/28/24.   Anal squamous cell carcinoma (HCC), cT2N1M0, stage IIIA Chest pain during chemotherapy -Patient with previous history of invasive squamous cell carcinoma of anus in 2023 - Status post surgical resection August 2023 - Status post recurrence with biopsy confirming invasive squamous cell carcinoma in April 2025 - Started Cycle 1 concurrent chemoradiation with 5-FU and mitomycin  01/31/2024 - Likely vasospasm during 5-FU infusion during C1 chemotherapy.   -- C2 chemo started 6/9 with close cardio monitoring. - Today 6/11 is C2 D3. -- Cardio/heart failure Drs. Stone and Turner monitoring.   - Continue cardiac medications Imdur , nifedipine and diltiazem  as ordered, pre and post chemotherapy.  - EKG done 6/9 - Continue daily radiation therapy as ordered - Plan PET scan 3 months postchemotherapy - Plan endoscopy 6 to 8 weeks posttreatment for rectal exam - Follows with medical oncology Dr. Maryalice Smaller.  Dr. Scherrie Curt covering for this hospitalization.  Skin breakdown, perineum -- secondary to ongoing RT -- No creams per Rad Onc.   -- Continue to gently clean area with water using peri-bottle given to patient  -- Monitor closely   Anemia - Likely due to malignancy, oncologic treatments, and dilution effect - Hemoglobin 8.5 6/10, CBC reordered 6/11 -- Transfuse PRBC for HGB<7.  No transfusion at this time planned.  - Continue to monitor CBC with differential  Headaches -- Ongoing -- patient with ongoing headaches; contacted cardio team, Imdur  dose decreased to 15 mg twice daily.  Nifedipine increased to 60 mg daily. -- Continue tylenol  alternating with Ibuprofen.  Anxiety/depression -Complains of feeling shaky. - Has been on Paxil  at  home; prescribed Paxil  20 mg however states she cuts in half, reportedly was told that she needs to increase to 20 mg tablet although admits to not taking 20 mg.  Restarted Paxil  today at 10 mg/day.  Will continue to monitor and increase if needed.   Code Status Full  Subjective:  Patient seen awake and alert laying in bed, later seen ambulating in hallway with assist.  Patient complains of ongoing headaches with minimal relief from current Tylenol  ibuprofen regimen.  Contacted cardio team and Imdur  dosage has been decreased.  Patient also complains of feeling shaky and states she has not taken her Paxil  in 2 days, this has been reordered.   Objective:   Intake/Output Summary (Last 24 hours) at 03/01/2024 0936 Last data filed at 03/01/2024 0849 Gross per 24 hour  Intake 240 ml  Output --  Net 240 ml     PHYSICAL EXAMINATION: ECOG PERFORMANCE STATUS: 1 - Symptomatic but completely ambulatory  Vitals:   02/28/24 1748 02/29/24 0606  BP: (!) 146/61 (!) 142/68  Pulse: 82 90  Resp: 16 16  Temp: 98 F (36.7 C) 97.8 F (36.6 C)  SpO2: 97% 96%   Filed Weights   02/28/24 1835  Weight: 172 lb (78 kg)    GENERAL: alert, no distress and comfortable +headaches SKIN: +perineum breakdown EYES: normal, conjunctiva are pink and non-injected, sclera clear OROPHARYNX: no exudate, no erythema and lips, buccal mucosa, and tongue normal  NECK: supple, thyroid normal size, non-tender, without nodularity LYMPH: no palpable lymphadenopathy in the cervical, axillary or inguinal LUNGS: clear to auscultation and percussion with normal breathing effort  HEART: regular rate & rhythm and no murmurs and no lower extremity edema ABDOMEN: abdomen soft, non-tender and normal bowel sounds MUSCULOSKELETAL: no cyanosis of digits and no clubbing  PSYCH: alert & oriented x 3 with fluent speech NEURO: no focal motor/sensory deficits   All questions were answered. The patient knows to call the clinic with  any problems, questions or concerns.   The total time spent in the appointment was 40 minutes encounter with patient including review of chart and various tests results, discussions about plan of care and coordination of care plan  Jacqualin Mate, NP 03/01/2024 9:36 AM    Labs Reviewed:  Lab Results  Component Value Date   WBC 3.4 (L) 02/29/2024   HGB 8.5 (L) 02/29/2024   HCT 25.4 (L) 02/29/2024   MCV 86.1 02/29/2024   PLT 167 02/29/2024   Recent Labs    02/28/24 1200 02/29/24 0345 03/01/24 0507  NA 136 137 140  K 3.7 3.3* 3.3*  CL 105 109 108  CO2 22 24 26   GLUCOSE 116* 104* 114*  BUN 17 14 9   CREATININE 0.76 0.97 0.65  CALCIUM  8.8* 8.3* 8.9  GFRNONAA >60 >60 >60  PROT 6.3* 5.7* 5.7*  ALBUMIN 3.3* 2.9* 2.9*  AST 21 20 19   ALT 17 16 16   ALKPHOS 99 86 57  BILITOT 0.5 0.7 0.6    Studies Reviewed:  IR PICC PLACEMENT RIGHT >5 YRS INC IMG GUIDE Result Date: 02/28/2024 INDICATION: for chemotherapy 68 year old female with history of anal cancer. In need of durable venous access for chemotherapy. Double lumen peripherally inserted central catheter requested. EXAM: ULTRASOUND AND FLUOROSCOPIC GUIDED PICC LINE INSERTION MEDICATIONS: 2 mL 1% lidocaine  CONTRAST:  None FLUOROSCOPY: Radiation Exposure Index and estimated peak skin dose (PSD); Reference air kerma (RAK), 0.1 mGy. COMPLICATIONS: None immediate. TECHNIQUE: The procedure, risks, benefits, and alternatives were explained to the patient and informed written consent was obtained. A timeout was performed prior to the initiation of the procedure. The RIGHT upper extremity was prepped with chlorhexidine  in a sterile fashion, and a sterile drape was applied covering the operative field. Maximum barrier sterile technique with sterile gowns and gloves were used for the procedure. A timeout was performed prior to the initiation of the procedure. Local anesthesia was provided with 1% lidocaine . Under direct ultrasound guidance, the RIGHT  basilic vein was accessed with a micropuncture kit after the overlying soft tissues were anesthetized with 1% lidocaine . An ultrasound image was saved for documentation purposes. A guidewire was advanced to the level of the superior caval-atrial junction for measurement purposes and the PICC line was cut to length. A peel-away sheath was placed and a 32 cm, 5 [place], dual lumen was inserted to level of the superior caval-atrial junction. A post procedure spot fluoroscopic was obtained. The catheter easily aspirated and flushed and was sutured in place. A dressing was placed. The patient tolerated the procedure well without immediate post procedural complication. FINDINGS: After catheter placement, the tip lies within the superior cavoatrial junction. The catheter aspirates and flushes normally and is ready for immediate use. IMPRESSION: Successful ultrasound and fluoroscopic guided placement of a RIGHT basilic vein approach, 32 cm, 5 Fr , dual lumen PICC. The tip of the catheter is positioned at the superior cavo-atrial junction. The PICC line is ready for immediate use. Performed by: [name] PA-C Electronically Signed   By: Art Largo M.D.   On: 02/28/2024 10:11   VAS US  LOWER EXTREMITY VENOUS (DVT) Result  Date: 02/06/2024  Lower Venous DVT Study Patient Name:  MADYSEN FAIRCLOTH  Date of Exam:   02/04/2024 Medical Rec #: [id number]        Accession #:    [id number] Date of Birth: [birth year]         Patient Gender: F Patient Age:   57 years Exam Location:  [facility] Procedure:      VAS US  LOWER EXTREMITY VENOUS (DVT) Referring Phys: Clifford Dam REGALADO --------------------------------------------------------------------------------  Indications: Pain, and right upper thigh.  Risk Factors: Cancer. Anticoagulation: Lovenox . Comparison Study: 10/27/23 Performing Technologist: Franky Ivanoff Sturdivant-Jones RDMS, RVT  Examination Guidelines: A complete evaluation includes B-mode imaging, spectral Doppler, color  Doppler, and power Doppler as needed of all accessible portions of each vessel. Bilateral testing is considered an integral part of a complete examination. Limited examinations for reoccurring indications may be performed as noted. The reflux portion of the exam is performed with the patient in reverse Trendelenburg.  +---------+---------------+---------+-----------+----------+--------------+ RIGHT    CompressibilityPhasicitySpontaneityPropertiesThrombus Aging +---------+---------------+---------+-----------+----------+--------------+ CFV      Full           Yes                                          +---------+---------------+---------+-----------+----------+--------------+ SFJ      Full                                                        +---------+---------------+---------+-----------+----------+--------------+ FV Prox  Full                                                        +---------+---------------+---------+-----------+----------+--------------+ FV Mid   Full                                                        +---------+---------------+---------+-----------+----------+--------------+ FV DistalFull                                                        +---------+---------------+---------+-----------+----------+--------------+ PFV      Full                                                        +---------+---------------+---------+-----------+----------+--------------+ POP      Full           Yes                                          +---------+---------------+---------+-----------+----------+--------------+ PTV  Full                                                        +---------+---------------+---------+-----------+----------+--------------+ PERO     Full                                                        +---------+---------------+---------+-----------+----------+--------------+    +---------+---------------+---------+-----------+----------+--------------+ LEFT     CompressibilityPhasicitySpontaneityPropertiesThrombus Aging +---------+---------------+---------+-----------+----------+--------------+ CFV      Full           Yes      Yes                                 +---------+---------------+---------+-----------+----------+--------------+ SFJ      Full                                                        +---------+---------------+---------+-----------+----------+--------------+ FV Prox  Full                                                        +---------+---------------+---------+-----------+----------+--------------+ FV Mid   Full                                                        +---------+---------------+---------+-----------+----------+--------------+ FV DistalFull                                                        +---------+---------------+---------+-----------+----------+--------------+ PFV      Full                                                        +---------+---------------+---------+-----------+----------+--------------+ POP      Full           Yes      Yes                                 +---------+---------------+---------+-----------+----------+--------------+ PTV      Full                                                        +---------+---------------+---------+-----------+----------+--------------+  PERO     Full                                                        +---------+---------------+---------+-----------+----------+--------------+     Summary: BILATERAL: - No evidence of deep vein thrombosis seen in the lower extremities, bilaterally. -No evidence of popliteal cyst, bilaterally.   *See table(s) above for measurements and observations. Electronically signed by Runell Countryman on 02/06/2024 at 9:35:02 AM.    Final    EEG adult Result Date: 02/04/2024 Eleni Griffin, MD      02/04/2024  4:03 PM Routine EEG Report JANEIL SCHEXNAYDER is a 68 y.o. female with a history of myoclonus who is undergoing an EEG to evaluate for seizures. Report: This EEG was acquired with electrodes placed according to the International 10-20 electrode system (including Fp1, Fp2, F3, F4, C3, C4, P3, P4, O1, O2, T3, T4, T5, T6, A1, A2, Fz, Cz, Pz). The following electrodes were missing or displaced: none. The occipital dominant rhythm was 5-7 Hz. This activity is reactive to stimulation. Drowsiness was manifested by background fragmentation; deeper stages of sleep were identified by K complexes and sleep spindles. There was no focal slowing. There were no interictal epileptiform discharges. There were no electrographic seizures identified. There was no abnormal response to photic stimulation or hyperventilation. Impression and clinical correlation: This EEG was obtained while awake and asleep and is abnormal due to mild-to-moderate diffuse slowing indicative of global cerebral dysfunction. Epileptiform abnormalities were not seen during this recording. Greg Leaks, MD [facility] [phone] If 7pm- 7am, please page neurology on call as listed in AMION.   CARDIAC CATHETERIZATION Result Date: 02/03/2024   Prox LAD lesion is 20% stenosed.   Prox RCA lesion is 20% stenosed. 1.  Mild nonobstructive coronary artery disease. 2.  Left ventricular angiography was not performed.  EF was normal by echo.  Moderately elevated left ventricular end-diastolic pressure at 26 mmHg. Recommendations: No culprit is identified for chest pain and elevated troponin.  Suspect myopericarditis based on symptoms and EKG changes. Colchicine  0.6 mg twice daily was added. I discontinued heparin  drip.   ECHOCARDIOGRAM COMPLETE Result Date: 02/03/2024    ECHOCARDIOGRAM REPORT   Patient Name:   BRITINEY BLAHNIK Date of Exam: 02/03/2024 Medical Rec #:  [id number]       Height:       65.0 in Accession #:    [id number]      Weight:        172.0 lb Date of Birth:  [birth year]        BSA:          1.855 m Patient Age:    68 years        BP:           110/63 mmHg Patient Gender: F               HR:           70 bpm. Exam Location:  Inpatient Procedure: 2D Echo, Color Doppler, Cardiac Doppler and Intracardiac            Opacification Agent (Both Spectral and Color Flow Doppler were            utilized during procedure). Indications:    Chest Pain  History:  Patient has prior history of Echocardiogram examinations.                 Signs/Symptoms:Dyspnea; Risk Factors:Hypertension.  Sonographer:    Willey Harrier Referring Phys: [id number] ZANE ADAMS  Sonographer Comments: Technically difficult study due to poor echo windows. IMPRESSIONS  1. Left ventricular ejection fraction, by estimation, is 55 to 60%. The left ventricle has normal function. The left ventricle has no regional wall motion abnormalities. Left ventricular diastolic parameters were normal.  2. Right ventricular systolic function is normal. The right ventricular size is normal.  3. The mitral valve is degenerative. Trivial mitral valve regurgitation. No evidence of mitral stenosis.  4. The aortic valve is tricuspid. Aortic valve regurgitation is not visualized. Aortic valve sclerosis/calcification is present, without any evidence of aortic stenosis. Aortic valve Vmax measures 1.35 m/s.  5. The inferior vena cava is normal in size with greater than 50% respiratory variability, suggesting right atrial pressure of 3 mmHg. FINDINGS  Left Ventricle: Left ventricular ejection fraction, by estimation, is 55 to 60%. The left ventricle has normal function. The left ventricle has no regional wall motion abnormalities. Definity  contrast agent was given IV to delineate the left ventricular  endocardial borders. The left ventricular internal cavity size was normal in size. There is no left ventricular hypertrophy. Left ventricular diastolic parameters were normal. Normal left ventricular filling  pressure. Right Ventricle: The right ventricular size is normal. No increase in right ventricular wall thickness. Right ventricular systolic function is normal. Left Atrium: Left atrial size was normal in size. Right Atrium: Right atrial size was normal in size. Pericardium: There is no evidence of pericardial effusion. Mitral Valve: The mitral valve is degenerative in appearance. There is mild calcification of the mitral valve leaflet(s). Mild mitral annular calcification. Trivial mitral valve regurgitation. No evidence of mitral valve stenosis. MV peak gradient, 4.3 mmHg. The mean mitral valve gradient is 2.0 mmHg. Tricuspid Valve: The tricuspid valve is normal in structure. Tricuspid valve regurgitation is not demonstrated. No evidence of tricuspid stenosis. Aortic Valve: The aortic valve is tricuspid. Aortic valve regurgitation is not visualized. Aortic valve sclerosis/calcification is present, without any evidence of aortic stenosis. Aortic valve peak gradient measures 7.3 mmHg. Pulmonic Valve: The pulmonic valve was normal in structure. Pulmonic valve regurgitation is not visualized. No evidence of pulmonic stenosis. Aorta: The aortic root is normal in size and structure. Venous: The inferior vena cava is normal in size with greater than 50% respiratory variability, suggesting right atrial pressure of 3 mmHg. IAS/Shunts: The interatrial septum appears to be lipomatous. No atrial level shunt detected by color flow Doppler.  LEFT VENTRICLE PLAX 2D LVIDd:         4.20 cm   Diastology LVIDs:         3.20 cm   LV e' medial:    8.27 cm/s LV PW:         0.80 cm   LV E/e' medial:  9.1 LV IVS:        0.90 cm   LV e' lateral:   7.29 cm/s LVOT diam:     2.00 cm   LV E/e' lateral: 10.3 LV SV:         64 LV SV Index:   35 LVOT Area:     3.14 cm  RIGHT VENTRICLE            IVC RV S prime:     9.25 cm/s  IVC diam: 2.50 cm TAPSE (M-mode): 2.0 cm  LEFT ATRIUM             Index        RIGHT ATRIUM          Index LA Vol (A2C):    37.0 ml 19.94 ml/m  RA Area:     9.78 cm LA Vol (A4C):   26.9 ml 14.50 ml/m  RA Volume:   20.70 ml 11.16 ml/m LA Biplane Vol: 32.0 ml 17.25 ml/m  AORTIC VALVE AV Area (Vmax): 1.96 cm AV Vmax:        135.00 cm/s AV Peak Grad:   7.3 mmHg LVOT Vmax:      84.20 cm/s LVOT Vmean:     63.800 cm/s LVOT VTI:       0.204 m  AORTA Ao Root diam: 2.60 cm Ao Asc diam:  2.80 cm MITRAL VALVE MV Area (PHT): 3.05 cm    SHUNTS MV Area VTI:   2.38 cm    Systemic VTI:  0.20 m MV Peak grad:  4.3 mmHg    Systemic Diam: 2.00 cm MV Mean grad:  2.0 mmHg MV Vmax:       1.04 m/s MV Vmean:      59.2 cm/s MV Decel Time: 249 msec MV E velocity: 75.20 cm/s MV A velocity: 95.30 cm/s MV E/A ratio:  0.79 Gaylyn Keas MD Electronically signed by Gaylyn Keas MD Signature Date/Time: 02/03/2024/1:28:23 PM    Final    DG Chest Port 1 View Result Date: 02/02/2024 CLINICAL DATA:  Status post PICC line placement. EXAM: PORTABLE CHEST 1 VIEW COMPARISON:  June 20, 2023 FINDINGS: A right-sided PICC line is seen with its distal tip approximately 1.8 cm distal to the junction of the superior vena cava and right atrium. The heart size and mediastinal contours are within normal limits. There is moderate severity calcification of the aortic arch. Stable mild to moderate severity biapical scarring and/or atelectasis is seen. No pleural effusion or pneumothorax is identified. The visualized skeletal structures are unremarkable. IMPRESSION: Right-sided PICC line positioning, as described above. Electronically Signed   By: Virgle Grime M.D.   On: 02/02/2024 23:24   CT Angio Chest PE W and/or Wo Contrast Result Date: 02/02/2024 CLINICAL DATA:  Chest pain. EXAM: CT ANGIOGRAPHY CHEST WITH CONTRAST TECHNIQUE: Multidetector CT imaging of the chest was performed using the standard protocol during bolus administration of intravenous contrast. Multiplanar CT image reconstructions and MIPs were obtained to evaluate the vascular anatomy. RADIATION DOSE  REDUCTION: This exam was performed according to the departmental dose-optimization program which includes automated exposure control, adjustment of the mA and/or kV according to patient size and/or use of iterative reconstruction technique. CONTRAST:  65mL OMNIPAQUE  IOHEXOL  350 MG/ML SOLN COMPARISON:  Jan 25, 2024. FINDINGS: Cardiovascular: Satisfactory opacification of the pulmonary arteries to the segmental level. No evidence of pulmonary embolism. Normal heart size. No pericardial effusion. Mediastinum/Nodes: No enlarged mediastinal, hilar, or axillary lymph nodes. Thyroid gland, trachea, and esophagus demonstrate no significant findings. Lungs/Pleura: No pneumothorax or pleural effusion is noted. Mild biapical scarring is noted. Upper Abdomen: No acute abnormality. Musculoskeletal: No chest wall abnormality. No acute or significant osseous findings. Review of the MIP images confirms the above findings. IMPRESSION: No definite evidence of pulmonary embolus. Electronically Signed   By: Rosalene Colon M.D.   On: 02/02/2024 13:14   IR PICC PLACEMENT LEFT >5 YRS INC IMG GUIDE Result Date: 01/31/2024 INDICATION: Patient with history of anal cancer; central venous access requested for chemotherapy EXAM: RIGHT UPPER  EXTREMITY PICC LINE PLACEMENT WITH ULTRASOUND AND FLUOROSCOPIC GUIDANCE MEDICATIONS: 4 mL 1% lidocaine  to skin and subcutaneous tissue ANESTHESIA/SEDATION: None FLUOROSCOPY: Radiation Exposure Index (as provided by the fluoroscopic device): 1 mGy Kerma COMPLICATIONS: None immediate. PROCEDURE: The patient was advised of the possible risks and complications and agreed to undergo the procedure. The patient was then brought to the angiographic suite for the procedure. The right arm was prepped with chlorhexidine , draped in the usual sterile fashion using maximum barrier technique (cap and mask, sterile gown, sterile gloves, large sterile sheet, hand hygiene and cutaneous antisepsis) and infiltrated locally  with 1% Lidocaine . Ultrasound demonstrated patency of the right basilic vein, and this was documented with an image. Under real-time ultrasound guidance, this vein was accessed with a 21 gauge micropuncture needle and image documentation was performed. A 0.018 wire was introduced in to the vein. Over this, a 5 [place] dual lumen power injectable PICC was advanced to the lower SVC/right atrial junction. Fluoroscopy during the procedure and fluoro spot radiograph confirms appropriate catheter position. The catheter was flushed and covered with a sterile dressing. Catheter length: 40 cm IMPRESSION: Successful RIGHT arm power PICC line placement with ultrasound and fluoroscopic guidance. The tip of the catheter is positioned at the superior cavo-atrial junction. the catheter is ready for use. Performed by: Wash Hack Electronically Signed   By: Art Largo M.D.   On: 01/31/2024 11:53  Ms. Rozycki was interviewed and examined.  She complains of jerks in the extremities and feeling shaky.  This may be related to discontinuing Paxil .  Paxil  will be resumed.  She appears to be tolerating the 5-fluorouracil  well.  She will continue cardiac prophylaxis as recommended by the cardiology service. I will add a potassium supplement.

## 2024-03-02 ENCOUNTER — Ambulatory Visit
Admission: RE | Admit: 2024-03-02 | Discharge: 2024-03-02 | Disposition: A | Discharge status: Home / Self Care | Source: Ambulatory Visit | Attending: Radiation Oncology | Admitting: Radiation Oncology

## 2024-03-02 ENCOUNTER — Other Ambulatory Visit: Payer: Self-pay

## 2024-03-02 DIAGNOSIS — D638 Anemia in other chronic diseases classified elsewhere: Secondary | ICD-10-CM

## 2024-03-02 DIAGNOSIS — I201 Angina pectoris with documented spasm: Secondary | ICD-10-CM | POA: Diagnosis not present

## 2024-03-02 LAB — CBC WITH DIFFERENTIAL/PLATELET
Abs Immature Granulocytes: 0.01 10*3/uL (ref 0.00–0.07)
Basophils Absolute: 0 10*3/uL (ref 0.0–0.1)
Basophils Relative: 1 %
Eosinophils Absolute: 0.1 10*3/uL (ref 0.0–0.5)
Eosinophils Relative: 2 %
HCT: 27.5 % — ABNORMAL LOW (ref 36.0–46.0)
Hemoglobin: 9.3 g/dL — ABNORMAL LOW (ref 12.0–15.0)
Immature Granulocytes: 0 %
Lymphocytes Relative: 8 %
Lymphs Abs: 0.2 10*3/uL — ABNORMAL LOW (ref 0.7–4.0)
MCH: 29.2 pg (ref 26.0–34.0)
MCHC: 33.8 g/dL (ref 30.0–36.0)
MCV: 86.2 fL (ref 80.0–100.0)
Monocytes Absolute: 0.6 10*3/uL (ref 0.1–1.0)
Monocytes Relative: 20 %
Neutro Abs: 1.9 10*3/uL (ref 1.7–7.7)
Neutrophils Relative %: 69 %
Platelets: 157 10*3/uL (ref 150–400)
RBC: 3.19 MIL/uL — ABNORMAL LOW (ref 3.87–5.11)
RDW: 15 % (ref 11.5–15.5)
WBC: 2.8 10*3/uL — ABNORMAL LOW (ref 4.0–10.5)
nRBC: 0 % (ref 0.0–0.2)

## 2024-03-02 LAB — RAD ONC ARIA SESSION SUMMARY
Course Elapsed Days: 31
Plan Fractions Treated to Date: 20
Plan Prescribed Dose Per Fraction: 1.8 Gy
Plan Total Fractions Prescribed: 30
Plan Total Prescribed Dose: 54 Gy
Reference Point Dosage Given to Date: 36 Gy
Reference Point Session Dosage Given: 1.8 Gy
Session Number: 20

## 2024-03-02 LAB — TROPONIN I (HIGH SENSITIVITY)
Troponin I (High Sensitivity): 6 ng/L (ref <18)
Troponin I (High Sensitivity): 6 ng/L (ref <18)

## 2024-03-02 LAB — MRSA NEXT GEN BY PCR, NASAL: MRSA by PCR Next Gen: NOT DETECTED

## 2024-03-02 MED ORDER — SODIUM CHLORIDE 0.9% FLUSH: 10.0000 mL | INTRAVENOUS | Status: DC | PRN

## 2024-03-02 MED ORDER — NIFEDIPINE ER OSMOTIC RELEASE 60 MG PO TB24
90.0000 mg | ORAL_TABLET | Freq: Every day | ORAL | Status: DC
  Administered 2024-03-02: 90 mg via ORAL
  Filled 2024-03-02 (×2): qty 1

## 2024-03-02 MED ORDER — NITROGLYCERIN 0.4 MG SL SUBL
0.4000 mg | SUBLINGUAL_TABLET | Freq: Once | SUBLINGUAL | Status: AC
  Administered 2024-03-02: 0.4 mg via SUBLINGUAL
  Filled 2024-03-02: qty 1

## 2024-03-02 MED ORDER — NITROGLYCERIN 0.4 MG SL SUBL
0.4000 mg | SUBLINGUAL_TABLET | SUBLINGUAL | Status: DC | PRN
  Administered 2024-03-02 (×2): 0.4 mg via SUBLINGUAL
  Filled 2024-03-02 (×2): qty 1

## 2024-03-02 MED ORDER — SODIUM CHLORIDE 0.9% FLUSH: 10.0000 mL | Freq: Two times a day (BID) | INTRAVENOUS | Status: DC

## 2024-03-02 MED ORDER — ISOSORBIDE MONONITRATE ER 60 MG PO TB24
30.0000 mg | ORAL_TABLET | Freq: Two times a day (BID) | ORAL | Status: DC
  Administered 2024-03-02 – 2024-03-03 (×2): 30 mg via ORAL
  Filled 2024-03-02 (×2): qty 1

## 2024-03-02 MED ORDER — NITROGLYCERIN IN D5W 200-5 MCG/ML-% IV SOLN: 0.0000 ug/min | INTRAVENOUS | Status: DC

## 2024-03-02 NOTE — Progress Notes (Signed)
 Patient had complaints of chest pain earlier this shift. Contacted cariology and order for nitroglycerin  sublingual obtained. Patient was also medicated with tylenol  and ibuprofen. She currently reports pain is gone. Initial pain was 6/10. Vitals stable

## 2024-03-02 NOTE — Progress Notes (Addendum)
 Patient complaining of sternal/substernal pain 6/10. EKG 12 lead obtained. Contacted cardiology on call. Gave tylenol  and ibuprofen for pain. Patient also has intermittent headaches- gave ibuprofen as ordered for headaches and tylenol  for chest pain.   No diaphoresis or nausea noted. On reassessment noted again that patient pain is substernal/ possibly epigastric. Administered PRN maalox to r/o indigestion.   Noted new orders for sublingal nitroglycerin . Administered and stayed with patient to monitor for relief of symptoms. Pain is slowing coming down from 6/10 to now 4/10  in her chest.   Pain does not radiate anywhere. Full set of vitals were obtained before nitroglycerin  was administered. See below.  Vitals:   03/01/24 2105 03/02/24 0235  BP: (!) 152/67 (!) 150/76  Pulse: (!) 104 (!) 103  Resp: 16 16  Temp: 98 F (36.7 C)   SpO2: 95% 99%

## 2024-03-02 NOTE — Progress Notes (Signed)
 ON-CALL CARDIOLOGY 03/02/24  Patient's name: Andrea [name].   MRN: [id number].    DOB: 01/08/1956 Primary care provider: Alejandro Hurt, FNP.  Interaction regarding this patient's care today: Cardiology was reach out this evening as she was have another episode of CP while on 5FU.  Sub lingual nitroglycerin  ordered. Troponins ordered  EKG ordered and reviewed - SR no underlying ischemia or injury pattern.   Impression: Chest pain Anal squamous cell carcinoma (HCC), cT2N1M0, stage IIIA  Recommendations: Cardiology, nursing care team, and primary dicussed the case via secure chat.  Oncology will stop 5FU infusion Trend troponin  EKG is non-ischemic  Since there is no cardiology cover at [facility] recommended transfer to CCU at [facility]. Per nursing staff patient refuses. Importance convyed.  Recommended transferring to ICU w/ CCM consult to help co-manage the patient  Start IV nitroglycerin  drip for CP control If hs troponin are high will need to start IV heparin  gtt and reconsider transfer to [facility]- will follow for now.  Spoke to CVICU attending on call as well.   No charge.   Awilda Bogus, [facility]  A Division of [facility] [address] [place], [place] [address]  [place], [place] [address] 03/02/2024 7:08 PM

## 2024-03-02 NOTE — Consult Note (Signed)
 NAME:  Andrea [name], MRN:  [id number], DOB:  1956-06-11, LOS: 3 ADMISSION DATE:  02/28/2024, CONSULTATION DATE:  03/02/24 REFERRING MD:  oncology/cardiology, CHIEF COMPLAINT:  chest pain   History of Present Illness:  68 yo female presented to [facility] for chemotherapy with 5-FU and mitomycin  (cycle 2 started on 02/28/24) for treatment of anal cancer. Pt unfortunately has been suffering from vasospasm 2/2 5-FU during cycle 1 she was admitted for close cardiac monitoring during this cycle. She continues on imdur , nifedipine  and dilt per cardiology recommendations. However today on d4 of cycle 2 pt began having worsening CP. Cardiology increased dosage of medications and ultimately recommended pt be transferred to [facility] on nitroglycerin  infusion. Pt has refused transfer and thus is being sent to ICU at [facility] for closer monitoring.  Oncology has stopped the 5-FU infusion. Trending troponins, thankfully EKG is non ischemic. Initiate heparin  infusion if troponin are elevated and escalating.   Ccm has been requested to be on board for  pt by cardiology. Dr Scherrie Curt, oncology, called for consult on their behalf.   Pertinent  Medical History  Anal cancer s/p resection, currently on radiation and chemo Anemia of chronic disease Chronic headaches Anxiety/depression  Significant Hospital Events: Including procedures, antibiotic start and stop dates in addition to other pertinent events   Admitted to [facility] for cardiac monitoring with 5-FU infusion 6/9 Transferred to ICU for nitroglycerin  infusion 6/12  Interim History / Subjective:    Objective    Blood pressure (!) 153/60, pulse 97, temperature 98.5 F (36.9 C), temperature source Oral, resp. rate (!) 27, height 5' 5 (1.651 m), weight 75.8 kg, SpO2 97%.        Intake/Output Summary (Last 24 hours) at 03/02/2024 2321 Last data filed at 03/02/2024 1200 Gross per 24 hour  Intake 240 ml  Output --  Net 240 ml   Filed Weights   02/28/24 1835 03/02/24 2000   Weight: 78 kg 75.8 kg    Examination: General: nad, resting comfortably in bed HENT: ncat, eomi, perrla, mmmp Lungs: ctab Cardiovascular: rrr no m/g/r Abdomen: soft, nt, nd bs+  Extremities: no c/c/e Neuro: no focal deficits GU: deferred  Resolved problem list   Assessment and Plan   Acute CP c/w angina 2/2 5-FU infusion, h/o vasospasm 2/2 medication Anal cancer s/p resection on radiation and chemo Chronic headaches Anemia of chronic disease Perineum skin wound 2/2 radiation Anxiety Depression -chemo infusion has been stopped -monitor in ICU for cardiac monitoring -trop negative -EKG negative -cont home meds otherwise -cont wound care -holding nitroglycerin  infusion in light of resolution of cp      Best Practice (right click and Reselect all SmartList Selections daily)   Diet/type: Regular consistency (see orders) DVT prophylaxis LMWH Pressure ulcer(s): present on admission  GI prophylaxis: N/A Lines: N/A Foley:  N/A Code Status:  full code Last date of multidisciplinary goals of care discussion [per primary]  Labs   CBC: Recent Labs  Lab 02/28/24 1200 02/29/24 0345 03/01/24 1212 03/02/24 0516  WBC 4.3 3.4* 3.8* 2.8*  NEUTROABS 3.1 2.4 2.9 1.9  HGB 9.3* 8.5* 9.4* 9.3*  HCT 28.2* 25.4* 28.2* 27.5*  MCV 87.9 86.1 88.4 86.2  PLT 213 167 176 157    Basic Metabolic Panel: Recent Labs  Lab 02/28/24 1200 02/29/24 0345 03/01/24 0507  NA 136 137 140  K 3.7 3.3* 3.3*  CL 105 109 108  CO2 22 24 26   GLUCOSE 116* 104* 114*  BUN 17 14 9   CREATININE  0.76 0.97 0.65  CALCIUM  8.8* 8.3* 8.9   GFR: Estimated Creatinine Clearance: 67.6 mL/min (by C-G formula based on SCr of 0.65 mg/dL). Recent Labs  Lab 02/28/24 1200 02/29/24 0345 03/01/24 1212 03/02/24 0516  WBC 4.3 3.4* 3.8* 2.8*    Liver Function Tests: Recent Labs  Lab 02/28/24 1200 02/29/24 0345 03/01/24 0507  AST 21 20 19   ALT 17 16 16   ALKPHOS 99 86 57  BILITOT 0.5 0.7 0.6   PROT 6.3* 5.7* 5.7*  ALBUMIN 3.3* 2.9* 2.9*   No results for input(s): LIPASE, AMYLASE in the last 168 hours. No results for input(s): AMMONIA in the last 168 hours.  ABG    Component Value Date/Time   PHART 7.409 02/03/2024 1003   PCO2ART 38.5 02/03/2024 1003   PO2ART 115 (H) 02/03/2024 1003   HCO3 24.5 02/03/2024 1003   TCO2 26 02/03/2024 1003   O2SAT 99 02/03/2024 1003     Coagulation Profile: No results for input(s): INR, PROTIME in the last 168 hours.  Cardiac Enzymes: No results for input(s): CKTOTAL, CKMB, CKMBINDEX, TROPONINI in the last 168 hours.  HbA1C: Hgb A1c MFr Bld  Date/Time Value Ref Range Status  02/02/2024 04:53 PM 5.7 (H) 4.8 - 5.6 % Final    Comment:    (NOTE) Pre diabetes:          5.7%-6.4%  Diabetes:              >6.4%  Glycemic control for   <7.0% adults with diabetes     CBG: No results for input(s): GLUCAP in the last 168 hours.  Review of Systems:   As per HPI  Past Medical History:  She,  has a past medical history of Anemia, Anginal pain (HCC), Arthritis, Chest discomfort, Disorder of immune system (HCC), Dyspnea, Dyspnea on exertion, Hypercholesterolemia, Hypertension, Migraine, Non-ulcer dyspepsia, Osteopenia of spine, Peptic ulcer disease, Pneumonia, Polyp of rectum, Pre-diabetes, Skin cancer, and Ulcerative colitis (HCC).   Surgical History:   Past Surgical History:  Procedure Laterality Date   ANKLE FRACTURE SURGERY Left    CARDIAC CATHETERIZATION     COLONOSCOPY     ESOPHAGOGASTRODUODENOSCOPY     hip replacement Bilateral    LEFT HEART CATH AND CORONARY ANGIOGRAPHY N/A 01/21/2022   Procedure: LEFT HEART CATH AND CORONARY ANGIOGRAPHY;  Surgeon: Odie Benne, MD;  Location: [facility] INVASIVE CV LAB;  Service: Cardiovascular;  Laterality: N/A;   LEFT HEART CATH AND CORONARY ANGIOGRAPHY N/A 02/03/2024   Procedure: LEFT HEART CATH AND CORONARY ANGIOGRAPHY;  Surgeon: Wenona Hamilton, MD;  Location: [facility]  INVASIVE CV LAB;  Service: Cardiovascular;  Laterality: N/A;   RECTAL BIOPSY N/A 05/04/2022   Procedure: BIOPSY RECTAL VS EXCISION OF PERIANAL LESION UNDER ANOSCOPY;  Surgeon: Melvenia Stabs, MD;  Location: [facility] ORS;  Service: General;  Laterality: N/A;   TOTAL KNEE ARTHROPLASTY Left 10/19/2022   Procedure: LEFT TOTAL KNEE ARTHROPLASTY;  Surgeon: Wes Hamman, MD;  Location: [facility] OR;  Service: Orthopedics;  Laterality: Left;   TUBAL LIGATION     TUMOR EXCISION N/A 01/14/2024   Procedure: TRANSRECTAL TUMOR EXCISION;  Surgeon: Melvenia Stabs, MD;  Location:  SURGERY CENTER;  Service: General;  Laterality: N/A;  Transanal excision of distal rectal and anal lesion (wide local)     Social History:   reports that she has never smoked. She has never used smokeless tobacco. She reports that she does not drink alcohol and does not use drugs.   Family History:  Her family history includes CAD (age of onset: 61) in her brother; CAD (age of onset: 6) in her mother; Heart attack in her mother; Heart attack (age of onset: 32) in her father; Other in her son; Skin cancer in her brother. There is no history of Colon cancer, Esophageal cancer, Rectal cancer, or Stomach cancer.   Allergies Allergies  Allergen Reactions   Codeine Nausea And Vomiting     Home Medications  Prior to Admission medications   Medication Sig Start Date End Date Taking? Authorizing Provider  acetaminophen  (TYLENOL ) 500 MG tablet Take 1,000 mg by mouth every 6 (six) hours as needed for moderate pain.   Yes [provider]  albuterol  (VENTOLIN  HFA) 108 (90 Base) MCG/ACT inhaler Inhale 1-2 puffs into the lungs every 4 (four) hours as needed for wheezing or shortness of breath. 06/20/23  Yes Baity, Rankin Buzzard, NP  ammonium lactate (LAC-HYDRIN) 12 % lotion Apply 1 Application topically daily.   Yes [provider]  atorvastatin  (LIPITOR) 80 MG tablet TAKE ONE TABLET BY MOUTH DAILY Patient taking  differently: Take 40 mg by mouth daily. 02/04/23  Yes Turner, Rufus Council, MD  colchicine  0.6 MG tablet Take 1 tablet (0.6 mg total) by mouth 2 (two) times daily. 02/04/24 03/05/24 Yes Regalado, Belkys A, MD  diltiazem  (CARDIZEM  CD) 180 MG 24 hr capsule Take 1 capsule (180 mg total) by mouth daily. 02/25/24 05/25/24 Yes Lauralee Poll, MD  diphenoxylate -atropine  (LOMOTIL ) 2.5-0.025 MG tablet Take 1-2 tablets by mouth 4 (four) times daily as needed for diarrhea or loose stools. 02/07/24  Yes Sonja Hershey, MD  ezetimibe  (ZETIA ) 10 MG tablet TAKE 1 TABLET BY MOUTH DAILY 04/13/23  Yes Turner, Rufus Council, MD  fluticasone  (FLONASE ) 50 MCG/ACT nasal spray Place 2 sprays into both nostrils daily. Patient taking differently: Place 2 sprays into both nostrils daily as needed for allergies. 11/26/17  Yes Yu, Amy V, PA-C  isosorbide  mononitrate (IMDUR ) 30 MG 24 hr tablet Take 1 tablet (30 mg total) by mouth at bedtime. 02/25/24  Yes Lauralee Poll, MD  nitroGLYCERIN  (NITROSTAT ) 0.4 MG SL tablet Place 1 tablet (0.4 mg total) under the tongue every 5 (five) minutes as needed for chest pain. 10/29/23 02/21/25 Yes Turner, Rufus Council, MD  ondansetron  (ZOFRAN ) 8 MG tablet Take 1 tablet (8 mg total) by mouth every 8 (eight) hours as needed for nausea or vomiting. 01/24/24  Yes Sonja Marietta, MD  pantoprazole  (PROTONIX ) 40 MG tablet Take 1 tablet (40 mg total) by mouth 2 (two) times daily. 02/04/24 03/05/24 Yes Regalado, Belkys A, MD  PARoxetine  (PAXIL ) 20 MG tablet Take 10 mg by mouth daily.   Yes [provider]  SUMAtriptan (IMITREX) 25 MG tablet Take 25 mg by mouth every 2 (two) hours as needed for migraine.   Yes [provider]  vedolizumab  (ENTYVIO ) 300 MG injection Inject 300 mg into the vein every 8 (eight) weeks.   Yes [provider]  magic mouthwash (nystatin , lidocaine , diphenhydrAMINE , alum & mag hydroxide) suspension Swish and swallow 5 mLs by mouth 3 (three) times daily as needed for mouth pain. Patient not  taking: Reported on 02/28/2024 02/08/24   Sonja [place], MD     Critical care time: 

## 2024-03-02 NOTE — Progress Notes (Signed)
 Spoke to Dr. Arno Bibles on call for oncology. She states that any direct conversations about patient's care plan will be done by Dr. Scherrie Curt in the morning. However, she states that it was in patient's best interest for chemo to be stopped since it is believed to have cause her chest pain. Additionally she stated that as long as the patient remains pain free with stable vital signs, her discharge should not be delayed. This information was relayed to patient and then son, Arlyce Lambert.

## 2024-03-02 NOTE — Progress Notes (Signed)
 Submitted pages/requests for on call Oncologist & Cardiologist to call to speak to patient/family about decision to stop last dose of chemotherapy and transfer to ICU.

## 2024-03-02 NOTE — Plan of Care (Signed)
   Problem: Education: Goal: Knowledge of General Education information will improve Description: Including pain rating scale, medication(s)/side effects and non-pharmacologic comfort measures Outcome: Progressing   Problem: Health Behavior/Discharge Planning: Goal: Ability to manage health-related needs will improve Outcome: Progressing   Problem: Clinical Measurements: Goal: Ability to maintain clinical measurements within normal limits will improve Outcome: Progressing Goal: Will remain free from infection Outcome: Progressing Goal: Diagnostic test results will improve Outcome: Progressing Goal: Respiratory complications will improve Outcome: Progressing Goal: Cardiovascular complication will be avoided Outcome: Progressing   Problem: Activity: Goal: Risk for activity intolerance will decrease Outcome: Progressing   Problem: Nutrition: Goal: Adequate nutrition will be maintained Outcome: Progressing   Problem: Coping: Goal: Level of anxiety will decrease Outcome: Progressing   Problem: Elimination: Goal: Will not experience complications related to bowel motility Outcome: Progressing Goal: Will not experience complications related to urinary retention Outcome: Progressing   Problem: Pain Managment: Goal: General experience of comfort will improve and/or be controlled Outcome: Progressing   Problem: Safety: Goal: Ability to remain free from injury will improve Outcome: Progressing   Problem: Skin Integrity: Goal: Risk for impaired skin integrity will decrease Outcome: Progressing   Problem: Education: Goal: Knowledge of the prescribed therapeutic regimen will improve Outcome: Progressing   Problem: Activity: Goal: Ability to implement measures to reduce episodes of fatigue will improve Outcome: Progressing   Problem: Bowel/Gastric: Goal: Will not experience complications related to bowel motility Outcome: Progressing   Problem: Coping: Goal: Ability to  identify and develop effective coping behavior will improve Outcome: Progressing   Problem: Nutritional: Goal: Maintenance of adequate nutrition will improve Outcome: Progressing

## 2024-03-02 NOTE — Progress Notes (Signed)
 Progress Note  Patient Name: Andrea [name] Date of Encounter: 03/02/2024  [facility] Cardiologist: Gaylyn Keas, MD   Patient Profile     Subjective   Patient complained of headaches 2 days ago and Imdur  was decreased to 15 mg twice daily and nifedipine XL increased to 60 mg daily  Complaint of chest pain twice yesterday evening responded to sublingual nitroglycerin .  5-FU infusion to finish tomorrow  Inpatient Medications    Scheduled Meds:  Chlorhexidine  Gluconate Cloth  6 each Topical Daily   diltiazem   180 mg Oral Daily   docusate sodium   100 mg Oral Daily   enoxaparin  (LOVENOX ) injection  40 mg Subcutaneous Q24H   fluorouracil  (ADRUCIL ) 7,000 mg in dextrose  5 % 1,000 mL chemo infusion  7,000 mg Intravenous Once   isosorbide  mononitrate  30 mg Oral BID   NIFEdipine  90 mg Oral Daily   PARoxetine   10 mg Oral Daily   potassium chloride  20 mEq Oral Daily   sodium chloride  flush  10-40 mL Intracatheter Q12H   Continuous Infusions:  sodium chloride  10 mL/hr at 02/29/24 0307   dextrose  10 mL/hr at 02/29/24 0307   PRN Meds: acetaminophen , alum & mag hydroxide-simeth, hydrocortisone , ibuprofen, nitroGLYCERIN , [DISCONTINUED] ondansetron  **OR** [DISCONTINUED] ondansetron  **OR** ondansetron  (ZOFRAN ) IV **OR** [DISCONTINUED] ondansetron  (ZOFRAN ) IV, mouth rinse, prochlorperazine , senna-docusate, sodium chloride  flush   Vital Signs    Vitals:   02/28/24 1835 02/29/24 0606 03/01/24 1334 03/02/24 0821  BP:  (!) 142/68 (!) 139/59 (!) 132/59  Pulse:  90 94 93  Resp:  16 16   Temp:  97.8 F (36.6 C) 97.8 F (36.6 C)   TempSrc:  Oral Oral   SpO2:  96% 97%   Weight: 78 kg     Height: 5' 5 (1.651 m)      No intake or output data in the 24 hours ending 03/02/24 1140     02/28/2024    6:35 PM 02/28/2024   10:30 AM 02/22/2024    2:48 PM  Last 3 Weights  Weight (lbs) 172 lb 172 lb 168 lb  Weight (kg) 78.019 kg 78.019 kg 76.204 kg      Telemetry    Normal sinus  rhythm- Personally Reviewed  ECG    No new EKG to review - Personally Reviewed  Physical Exam   GEN: Well nourished, well developed in no acute distress HEENT: Normal NECK: No JVD; No carotid bruits LYMPHATICS: No lymphadenopathy CARDIAC:RRR, no murmurs, rubs, gallops RESPIRATORY:  Clear to auscultation without rales, wheezing or rhonchi  ABDOMEN: Soft, non-tender, non-distended MUSCULOSKELETAL:  No edema; No deformity  SKIN: Warm and dry NEUROLOGIC:  Alert and oriented x 3 PSYCHIATRIC:  Normal affect  Labs    High Sensitivity Troponin:   Recent Labs  Lab 02/02/24 1322 02/02/24 1956 02/02/24 2335 02/03/24 0904 02/28/24 1525  TROPONINIHS 27* 33* 43* 426* 6      Chemistry Recent Labs  Lab 02/28/24 1200 02/29/24 0345 03/01/24 0507  NA 136 137 140  K 3.7 3.3* 3.3*  CL 105 109 108  CO2 22 24 26   GLUCOSE 116* 104* 114*  BUN 17 14 9   CREATININE 0.76 0.97 0.65  CALCIUM  8.8* 8.3* 8.9  PROT 6.3* 5.7* 5.7*  ALBUMIN 3.3* 2.9* 2.9*  AST 21 20 19   ALT 17 16 16   ALKPHOS 99 86 57  BILITOT 0.5 0.7 0.6  GFRNONAA >60 >60 >60  ANIONGAP 9 4* 6     Hematology Recent Labs  Lab 02/29/24 0345 03/01/24 1212 03/02/24 0516  WBC 3.4* 3.8* 2.8*  RBC 2.95* 3.19* 3.19*  HGB 8.5* 9.4* 9.3*  HCT 25.4* 28.2* 27.5*  MCV 86.1 88.4 86.2  MCH 28.8 29.5 29.2  MCHC 33.5 33.3 33.8  RDW 14.8 15.0 15.0  PLT 167 176 157    BNPNo results for input(s): BNP, PROBNP in the last 168 hours.   DDimer No results for input(s): DDIMER in the last 168 hours.   Radiology    No results found.   Patient Profile     68 y.o. female  with a hx of ASCAD (Coronary CTA revealed calcium  score of 190, 89th percentile for age and sex matched control, mild nonobstructive CAD in the proximal portion of LAD, RCA, and OM1), hypertension, hyperlipidemia, ulcerative colitis, peptic ulcer disease and anal cancer Who is being seen today for the evaluation to follow patient while undergoing  chemotherapy with 5-FU for her anal carcinoma at the request of Arta Lark, MD and Dr. Marton Sleeper.   Assessment & Plan    # 5-FU induced coronary vasospasm #Coronary artery disease #Hyperlipidemia - Cath 02/03/2024 done for chest pain after 5-FU infusion showed mild nonobstructive disease with 20% proximal LAD/RCA - Chest pain at the time of cath was felt related to coronary vasospasm from 5-FU and not from myocarditis (ESR was normal) - Followed in cardio oncology clinic by Dr. Alease Amend - Now admitted for recurrent 5-FU infusion - While admitted: Will follow the following protocol from [facility].  - Patient complained yesterday of headaches on Imdur  30 mg twice daily so we decreased to 50 mg twice daily and increase nifedipine to 60 mg daily -Unfortunately had recurrent chest pain x 2 last night responsive to nitrates -Will increase Imdur  back to 30 mg twice daily and increase nifedipine to 90 mg daily -Treat headaches with extra strength Tylenol  -Once 5-FU infusion has finished we can decrease Imdur  back to 15 mg twice daily  #Hypertension - BP controlled at 132/59 mmHg - Continue Cardizem  CD 180 mg daily - Increasing quetiapine ER to 90 mg daily    For questions or updates, please contact [facility] Please consult [email or web address] for contact info under        Signed, Gaylyn Keas, MD  03/02/2024, 11:40 AM

## 2024-03-02 NOTE — Progress Notes (Signed)
 Pt with 4/10 chest pain. Providers notified. SL nitroglycerin  given x1. STAT EKG. Per Dr. Scherrie Curt, stop 5FU. 5FU stopped and blood return from PICC line. Pt pain in headache 3/10. Tylenol  and Motrin given. Pt chest pain 0/10/ This nurse present at the beside. Pt VSS. Per cardiology team, continue with STAT IV NTG gtt and transfer pt to ICU. Pt a&ox4, VSS.  Rise and fall of chest observed. Call light within reach. Bed in the lowest position and care continues.

## 2024-03-02 NOTE — Plan of Care (Signed)

## 2024-03-02 NOTE — Progress Notes (Addendum)
 Andrea [name]   DOB:08/04/1956   WN#:[id number]      ASSESSMENT & PLAN:  Andrea [name] is a 68 year old female patient with oncologic history significant for anal cancer.  She is now admitted for cycle 2 chemotherapy with 5-FU and mitomycin  started on 02/28/24.    Anal squamous cell carcinoma (HCC), cT2N1M0, stage IIIA -History of invasive squamous cell carcinoma of anus in 2023 - Status post surgical resection August 2023 - Status post recurrence with biopsy confirming invasive squamous cell carcinoma in April 2025 - Started Cycle 1 concurrent chemoradiation with 5-FU and mitomycin  01/31/2024 -- C2 chemo started 6/9 with close cardio monitoring. - Today 6/12 is C2 D4. - Continue daily radiation therapy as ordered - Plan PET scan 3 months postchemotherapy - Plan endoscopy 6 to 8 weeks posttreatment for rectal exam - Follows with medical oncology Dr. Maryalice Smaller.  Dr. Scherrie Curt covering for this hospitalization.  Chest pain during chemotherapy  recurrent 6/12 - Likely vasospasm during 5-FU infusion during C1 chemotherapy.   - EKG done 6/9 - Continue cardiac medications Imdur , nifedipine and diltiazem  as ordered, pre and post chemotherapy.  -Patient complains of 2 episodes of chest pain overnight/early hours this a.m. nitro SL administered with good relief.  Discussed with cardio team and cardio meds have been adjusted, continue as ordered. -- Cardio/heart failure Drs. Stone and Turner following. - Will continue to monitor closely during infusion.  Headaches -- Ongoing -- C/o ongoing headaches; contacted cardio team 6/11, Imdur  decreased to 15 mg twice daily.  Nifedipine increased to 60 mg daily. - Due to chest pain 6/12, Imdur  increased to 30 mg and nifedipine increased to 90 mg, continue as ordered. -- Continue tylenol  alternating with Ibuprofen.   Skin breakdown, perineum -- secondary to ongoing RT -- No creams per Rad Onc.   -- Continue to gently clean area with water using peri-bottle  given to patient  -- Monitor closely   Anemia - Likely due to malignancy, oncologic treatments, and dilution effect - Hemoglobin stable 9.3 today -- Transfuse PRBC for HGB<7.  No transfusion at this time planned.  - Continue to monitor CBC with differential   Anxiety/depression - States shaky feeling has resolved since initiation of Paxil  on 6/11. - Will continue to monitor and increase if needed.    Code Status Full  Subjective:  Patient seen awake and alert just returned from radiation therapy.  Reports events overnight that she had 2 episodes of chest pain which was resolved with nitroglycerin .  Currently has no chest pain.  However ongoing slight headache although better than yesterday.  Discussed with patient that Imdur  dose will be increased and will only be for another day during her 5-FU infusion.  Patient's son at bedside.  No other acute distress is noted.  Objective:  No intake or output data in the 24 hours ending 03/02/24 1045   PHYSICAL EXAMINATION: ECOG PERFORMANCE STATUS: 1 - Symptomatic but completely ambulatory  Vitals:   03/01/24 1334 03/02/24 0821  BP: (!) 139/59 (!) 132/59  Pulse: 94 93  Resp: 16   Temp: 97.8 F (36.6 C)   SpO2: 97%    Filed Weights   02/28/24 1835  Weight: 172 lb (78 kg)    GENERAL: alert, no distress and comfortable SKIN: skin color, texture, turgor are normal, no rashes or significant lesions EYES: normal, conjunctiva are pink and non-injected, sclera clear OROPHARYNX: no exudate, no erythema and lips, buccal mucosa, and tongue normal  NECK: supple, thyroid normal size,  non-tender, without nodularity LYMPH: no palpable lymphadenopathy in the cervical, axillary or inguinal LUNGS: clear to auscultation and percussion with normal breathing effort HEART: regular rate & rhythm and no murmurs and no lower extremity edema ABDOMEN: abdomen soft, non-tender and normal bowel sounds MUSCULOSKELETAL: no cyanosis of digits and no  clubbing  PSYCH: alert & oriented x 3 with fluent speech NEURO: no focal motor/sensory deficits   All questions were answered. The patient knows to call the clinic with any problems, questions or concerns.   The total time spent in the appointment was 60 minutes encounter with patient including review of chart and various tests results, discussions about plan of care and coordination of care plan  Andrea Mate, Andrea [name] 03/02/2024 10:45 AM    Labs Reviewed:  Lab Results  Component Value Date   WBC 2.8 (L) 03/02/2024   HGB 9.3 (L) 03/02/2024   HCT 27.5 (L) 03/02/2024   MCV 86.2 03/02/2024   PLT 157 03/02/2024   Recent Labs    02/28/24 1200 02/29/24 0345 03/01/24 0507  NA 136 137 140  K 3.7 3.3* 3.3*  CL 105 109 108  CO2 22 24 26   GLUCOSE 116* 104* 114*  BUN 17 14 9   CREATININE 0.76 0.97 0.65  CALCIUM  8.8* 8.3* 8.9  GFRNONAA >60 >60 >60  PROT 6.3* 5.7* 5.7*  ALBUMIN 3.3* 2.9* 2.9*  AST 21 20 19   ALT 17 16 16   ALKPHOS 99 86 57  BILITOT 0.5 0.7 0.6    Studies Reviewed:  IR PICC PLACEMENT RIGHT >5 YRS INC IMG GUIDE Result Date: 02/28/2024 INDICATION: for chemotherapy 68 year old female with history of anal cancer. In need of durable venous access for chemotherapy. Double lumen peripherally inserted central catheter requested. EXAM: ULTRASOUND AND FLUOROSCOPIC GUIDED PICC LINE INSERTION MEDICATIONS: 2 mL 1% lidocaine  CONTRAST:  None FLUOROSCOPY: Radiation Exposure Index and estimated peak skin dose (PSD); Reference air kerma (RAK), 0.1 mGy. COMPLICATIONS: None immediate. TECHNIQUE: The procedure, risks, benefits, and alternatives were explained to the patient and informed written consent was obtained. A timeout was performed prior to the initiation of the procedure. The RIGHT upper extremity was prepped with chlorhexidine  in a sterile fashion, and a sterile drape was applied covering the operative field. Maximum barrier sterile technique with sterile gowns and gloves were used for  the procedure. A timeout was performed prior to the initiation of the procedure. Local anesthesia was provided with 1% lidocaine . Under direct ultrasound guidance, the RIGHT basilic vein was accessed with a micropuncture kit after the overlying soft tissues were anesthetized with 1% lidocaine . An ultrasound image was saved for documentation purposes. A guidewire was advanced to the level of the superior caval-atrial junction for measurement purposes and the PICC line was cut to length. A peel-away sheath was placed and a 32 cm, 5 [place], dual lumen was inserted to level of the superior caval-atrial junction. A post procedure spot fluoroscopic was obtained. The catheter easily aspirated and flushed and was sutured in place. A dressing was placed. The patient tolerated the procedure well without immediate post procedural complication. FINDINGS: After catheter placement, the tip lies within the superior cavoatrial junction. The catheter aspirates and flushes normally and is ready for immediate use. IMPRESSION: Successful ultrasound and fluoroscopic guided placement of a RIGHT basilic vein approach, 32 cm, 5 Fr , dual lumen PICC. The tip of the catheter is positioned at the superior cavo-atrial junction. The PICC line is ready for immediate use. Performed by: [name] PA-C  Electronically Signed   By: Art Largo M.D.   On: 02/28/2024 10:11   VAS US  LOWER EXTREMITY VENOUS (DVT) Result Date: 02/06/2024  Lower Venous DVT Study Patient Name:  Andrea [name]  Date of Exam:   02/04/2024 Medical Rec #: [id number]        Accession #:    [id number] Date of Birth: 1956/06/03         Patient Gender: F Patient Age:   79 years Exam Location:  [facility] Procedure:      VAS US  LOWER EXTREMITY VENOUS (DVT) Referring Phys: Clifford Dam REGALADO --------------------------------------------------------------------------------  Indications: Pain, and right upper thigh.  Risk Factors: Cancer. Anticoagulation: Lovenox . Comparison  Study: 10/27/23 Performing Technologist: Franky Ivanoff Sturdivant-Jones RDMS, RVT  Examination Guidelines: A complete evaluation includes B-mode imaging, spectral Doppler, color Doppler, and power Doppler as needed of all accessible portions of each vessel. Bilateral testing is considered an integral part of a complete examination. Limited examinations for reoccurring indications may be performed as noted. The reflux portion of the exam is performed with the patient in reverse Trendelenburg.  +---------+---------------+---------+-----------+----------+--------------+ RIGHT    CompressibilityPhasicitySpontaneityPropertiesThrombus Aging +---------+---------------+---------+-----------+----------+--------------+ CFV      Full           Yes                                          +---------+---------------+---------+-----------+----------+--------------+ SFJ      Full                                                        +---------+---------------+---------+-----------+----------+--------------+ FV Prox  Full                                                        +---------+---------------+---------+-----------+----------+--------------+ FV Mid   Full                                                        +---------+---------------+---------+-----------+----------+--------------+ FV DistalFull                                                        +---------+---------------+---------+-----------+----------+--------------+ PFV      Full                                                        +---------+---------------+---------+-----------+----------+--------------+ POP      Full           Yes                                          +---------+---------------+---------+-----------+----------+--------------+  PTV      Full                                                        +---------+---------------+---------+-----------+----------+--------------+ PERO     Full                                                         +---------+---------------+---------+-----------+----------+--------------+   +---------+---------------+---------+-----------+----------+--------------+ LEFT     CompressibilityPhasicitySpontaneityPropertiesThrombus Aging +---------+---------------+---------+-----------+----------+--------------+ CFV      Full           Yes      Yes                                 +---------+---------------+---------+-----------+----------+--------------+ SFJ      Full                                                        +---------+---------------+---------+-----------+----------+--------------+ FV Prox  Full                                                        +---------+---------------+---------+-----------+----------+--------------+ FV Mid   Full                                                        +---------+---------------+---------+-----------+----------+--------------+ FV DistalFull                                                        +---------+---------------+---------+-----------+----------+--------------+ PFV      Full                                                        +---------+---------------+---------+-----------+----------+--------------+ POP      Full           Yes      Yes                                 +---------+---------------+---------+-----------+----------+--------------+ PTV      Full                                                        +---------+---------------+---------+-----------+----------+--------------+  PERO     Full                                                        +---------+---------------+---------+-----------+----------+--------------+     Summary: BILATERAL: - No evidence of deep vein thrombosis seen in the lower extremities, bilaterally. -No evidence of popliteal cyst, bilaterally.   *See table(s) above for measurements and observations.  Electronically signed by Runell Countryman on 02/06/2024 at 9:35:02 AM.    Final    EEG adult Result Date: 02/04/2024 Eleni Griffin, MD     02/04/2024  4:03 PM Routine EEG Report Andrea [name] is a 68 y.o. female with a history of myoclonus who is undergoing an EEG to evaluate for seizures. Report: This EEG was acquired with electrodes placed according to the International 10-20 electrode system (including Fp1, Fp2, F3, F4, C3, C4, P3, P4, O1, O2, T3, T4, T5, T6, A1, A2, Fz, Cz, Pz). The following electrodes were missing or displaced: none. The occipital dominant rhythm was 5-7 Hz. This activity is reactive to stimulation. Drowsiness was manifested by background fragmentation; deeper stages of sleep were identified by K complexes and sleep spindles. There was no focal slowing. There were no interictal epileptiform discharges. There were no electrographic seizures identified. There was no abnormal response to photic stimulation or hyperventilation. Impression and clinical correlation: This EEG was obtained while awake and asleep and is abnormal due to mild-to-moderate diffuse slowing indicative of global cerebral dysfunction. Epileptiform abnormalities were not seen during this recording. Greg Leaks, MD [facility] [phone] If 7pm- 7am, please page neurology on call as listed in AMION.   CARDIAC CATHETERIZATION Result Date: 02/03/2024   Prox LAD lesion is 20% stenosed.   Prox RCA lesion is 20% stenosed. 1.  Mild nonobstructive coronary artery disease. 2.  Left ventricular angiography was not performed.  EF was normal by echo.  Moderately elevated left ventricular end-diastolic pressure at 26 mmHg. Recommendations: No culprit is identified for chest pain and elevated troponin.  Suspect myopericarditis based on symptoms and EKG changes. Colchicine  0.6 mg twice daily was added. I discontinued heparin  drip.   ECHOCARDIOGRAM COMPLETE Result Date: 02/03/2024    ECHOCARDIOGRAM REPORT   Patient  Name:   Andrea [name] Date of Exam: 02/03/2024 Medical Rec #:  [id number]       Height:       65.0 in Accession #:    [id number]      Weight:       172.0 lb Date of Birth:  1956-02-24        BSA:          1.855 m Patient Age:    68 years        BP:           110/63 mmHg Patient Gender: F               HR:           70 bpm. Exam Location:  Inpatient Procedure: 2D Echo, Color Doppler, Cardiac Doppler and Intracardiac            Opacification Agent (Both Spectral and Color Flow Doppler were            utilized during procedure). Indications:    Chest Pain  History:  Patient has prior history of Echocardiogram examinations.                 Signs/Symptoms:Dyspnea; Risk Factors:Hypertension.  Sonographer:    Willey Harrier Referring Phys: [id number] ZANE ADAMS  Sonographer Comments: Technically difficult study due to poor echo windows. IMPRESSIONS  1. Left ventricular ejection fraction, by estimation, is 55 to 60%. The left ventricle has normal function. The left ventricle has no regional wall motion abnormalities. Left ventricular diastolic parameters were normal.  2. Right ventricular systolic function is normal. The right ventricular size is normal.  3. The mitral valve is degenerative. Trivial mitral valve regurgitation. No evidence of mitral stenosis.  4. The aortic valve is tricuspid. Aortic valve regurgitation is not visualized. Aortic valve sclerosis/calcification is present, without any evidence of aortic stenosis. Aortic valve Vmax measures 1.35 m/s.  5. The inferior vena cava is normal in size with greater than 50% respiratory variability, suggesting right atrial pressure of 3 mmHg. FINDINGS  Left Ventricle: Left ventricular ejection fraction, by estimation, is 55 to 60%. The left ventricle has normal function. The left ventricle has no regional wall motion abnormalities. Definity  contrast agent was given IV to delineate the left ventricular  endocardial borders. The left ventricular internal cavity size was  normal in size. There is no left ventricular hypertrophy. Left ventricular diastolic parameters were normal. Normal left ventricular filling pressure. Right Ventricle: The right ventricular size is normal. No increase in right ventricular wall thickness. Right ventricular systolic function is normal. Left Atrium: Left atrial size was normal in size. Right Atrium: Right atrial size was normal in size. Pericardium: There is no evidence of pericardial effusion. Mitral Valve: The mitral valve is degenerative in appearance. There is mild calcification of the mitral valve leaflet(s). Mild mitral annular calcification. Trivial mitral valve regurgitation. No evidence of mitral valve stenosis. MV peak gradient, 4.3 mmHg. The mean mitral valve gradient is 2.0 mmHg. Tricuspid Valve: The tricuspid valve is normal in structure. Tricuspid valve regurgitation is not demonstrated. No evidence of tricuspid stenosis. Aortic Valve: The aortic valve is tricuspid. Aortic valve regurgitation is not visualized. Aortic valve sclerosis/calcification is present, without any evidence of aortic stenosis. Aortic valve peak gradient measures 7.3 mmHg. Pulmonic Valve: The pulmonic valve was normal in structure. Pulmonic valve regurgitation is not visualized. No evidence of pulmonic stenosis. Aorta: The aortic root is normal in size and structure. Venous: The inferior vena cava is normal in size with greater than 50% respiratory variability, suggesting right atrial pressure of 3 mmHg. IAS/Shunts: The interatrial septum appears to be lipomatous. No atrial level shunt detected by color flow Doppler.  LEFT VENTRICLE PLAX 2D LVIDd:         4.20 cm   Diastology LVIDs:         3.20 cm   LV e' medial:    8.27 cm/s LV PW:         0.80 cm   LV E/e' medial:  9.1 LV IVS:        0.90 cm   LV e' lateral:   7.29 cm/s LVOT diam:     2.00 cm   LV E/e' lateral: 10.3 LV SV:         64 LV SV Index:   35 LVOT Area:     3.14 cm  RIGHT VENTRICLE            IVC RV S  prime:     9.25 cm/s  IVC diam: 2.50 cm TAPSE (M-mode): 2.0  cm LEFT ATRIUM             Index        RIGHT ATRIUM          Index LA Vol (A2C):   37.0 ml 19.94 ml/m  RA Area:     9.78 cm LA Vol (A4C):   26.9 ml 14.50 ml/m  RA Volume:   20.70 ml 11.16 ml/m LA Biplane Vol: 32.0 ml 17.25 ml/m  AORTIC VALVE AV Area (Vmax): 1.96 cm AV Vmax:        135.00 cm/s AV Peak Grad:   7.3 mmHg LVOT Vmax:      84.20 cm/s LVOT Vmean:     63.800 cm/s LVOT VTI:       0.204 m  AORTA Ao Root diam: 2.60 cm Ao Asc diam:  2.80 cm MITRAL VALVE MV Area (PHT): 3.05 cm    SHUNTS MV Area VTI:   2.38 cm    Systemic VTI:  0.20 m MV Peak grad:  4.3 mmHg    Systemic Diam: 2.00 cm MV Mean grad:  2.0 mmHg MV Vmax:       1.04 m/s MV Vmean:      59.2 cm/s MV Decel Time: 249 msec MV E velocity: 75.20 cm/s MV A velocity: 95.30 cm/s MV E/A ratio:  0.79 Gaylyn Keas MD Electronically signed by Gaylyn Keas MD Signature Date/Time: 02/03/2024/1:28:23 PM    Final    DG Chest Port 1 View Result Date: 02/02/2024 CLINICAL DATA:  Status post PICC line placement. EXAM: PORTABLE CHEST 1 VIEW COMPARISON:  June 20, 2023 FINDINGS: A right-sided PICC line is seen with its distal tip approximately 1.8 cm distal to the junction of the superior vena cava and right atrium. The heart size and mediastinal contours are within normal limits. There is moderate severity calcification of the aortic arch. Stable mild to moderate severity biapical scarring and/or atelectasis is seen. No pleural effusion or pneumothorax is identified. The visualized skeletal structures are unremarkable. IMPRESSION: Right-sided PICC line positioning, as described above. Electronically Signed   By: Virgle Grime M.D.   On: 02/02/2024 23:24   CT Angio Chest PE W and/or Wo Contrast Result Date: 02/02/2024 CLINICAL DATA:  Chest pain. EXAM: CT ANGIOGRAPHY CHEST WITH CONTRAST TECHNIQUE: Multidetector CT imaging of the chest was performed using the standard protocol during bolus  administration of intravenous contrast. Multiplanar CT image reconstructions and MIPs were obtained to evaluate the vascular anatomy. RADIATION DOSE REDUCTION: This exam was performed according to the departmental dose-optimization program which includes automated exposure control, adjustment of the mA and/or kV according to patient size and/or use of iterative reconstruction technique. CONTRAST:  65mL OMNIPAQUE  IOHEXOL  350 MG/ML SOLN COMPARISON:  Jan 25, 2024. FINDINGS: Cardiovascular: Satisfactory opacification of the pulmonary arteries to the segmental level. No evidence of pulmonary embolism. Normal heart size. No pericardial effusion. Mediastinum/Nodes: No enlarged mediastinal, hilar, or axillary lymph nodes. Thyroid gland, trachea, and esophagus demonstrate no significant findings. Lungs/Pleura: No pneumothorax or pleural effusion is noted. Mild biapical scarring is noted. Upper Abdomen: No acute abnormality. Musculoskeletal: No chest wall abnormality. No acute or significant osseous findings. Review of the MIP images confirms the above findings. IMPRESSION: No definite evidence of pulmonary embolus. Electronically Signed   By: Rosalene Colon M.D.   On: 02/02/2024 13:14   Ms. Szeliga was interviewed and examined.  She reports feeling better after the Paxil  was resumed.  She had 2 episodes of chest pain early this morning, both  relieved with nitroglycerin .  She had nausea relieved with Zofran .  No pain when I saw her at approximately 7 AM this morning.  No other complaint.  The plan is to continue the 5-FU infusion unless she develops recurrent chest pain on the current medical regimen.  She will continue medical management of the coronary vasospasm per cardiology.  The plan is for discharge to home following completion of chemotherapy tomorrow.

## 2024-03-02 NOTE — Progress Notes (Signed)
 Pt refusing to transfer to [facility]. This nurse explained to pt reason for transfer. Pt verbalized understanding. Providers notified and care continues.

## 2024-03-02 NOTE — Progress Notes (Signed)
 Nitroglycerin  drip held per [employer], DO. Will start infusion if patient complains of chest pain.

## 2024-03-02 NOTE — Progress Notes (Signed)
 eLink Physician-Brief Progress Note Patient Name: Andrea [name] DOB: December 24, 1955 MRN: [id number]   Date of Service  03/02/2024  HPI/Events of Note  Chemotherapy patient transferred to ICU for chest pains in the context of receiving 5 FU chemotherapy. Cardiology following.  eICU Interventions  New Patient Evaluation.        [name] U Shaniquia Brafford 03/02/2024, 9:27 PM

## 2024-03-03 ENCOUNTER — Other Ambulatory Visit: Payer: Self-pay

## 2024-03-03 ENCOUNTER — Inpatient Hospital Stay

## 2024-03-03 ENCOUNTER — Ambulatory Visit
Admission: RE | Admit: 2024-03-03 | Discharge: 2024-03-03 | Disposition: A | Discharge status: Home / Self Care | Source: Ambulatory Visit | Attending: Radiation Oncology | Admitting: Radiation Oncology

## 2024-03-03 ENCOUNTER — Ambulatory Visit
Admission: RE | Admit: 2024-03-03 | Discharge: 2024-03-03 | Disposition: A | Discharge status: Home / Self Care | Payer: Self-pay | Source: Ambulatory Visit | Attending: Radiation Oncology | Admitting: Radiation Oncology

## 2024-03-03 DIAGNOSIS — R079 Chest pain, unspecified: Secondary | ICD-10-CM | POA: Diagnosis not present

## 2024-03-03 LAB — CBC WITH DIFFERENTIAL/PLATELET
Abs Immature Granulocytes: 0 10*3/uL (ref 0.00–0.07)
Basophils Absolute: 0 10*3/uL (ref 0.0–0.1)
Basophils Relative: 1 %
Eosinophils Absolute: 0 10*3/uL (ref 0.0–0.5)
Eosinophils Relative: 1 %
HCT: 28.7 % — ABNORMAL LOW (ref 36.0–46.0)
Hemoglobin: 9.6 g/dL — ABNORMAL LOW (ref 12.0–15.0)
Immature Granulocytes: 0 %
Lymphocytes Relative: 8 %
Lymphs Abs: 0.2 10*3/uL — ABNORMAL LOW (ref 0.7–4.0)
MCH: 29.6 pg (ref 26.0–34.0)
MCHC: 33.4 g/dL (ref 30.0–36.0)
MCV: 88.6 fL (ref 80.0–100.0)
Monocytes Absolute: 0.4 10*3/uL (ref 0.1–1.0)
Monocytes Relative: 20 %
Neutro Abs: 1.5 10*3/uL — ABNORMAL LOW (ref 1.7–7.7)
Neutrophils Relative %: 70 %
Platelets: 150 10*3/uL (ref 150–400)
RBC: 3.24 MIL/uL — ABNORMAL LOW (ref 3.87–5.11)
RDW: 15.2 % (ref 11.5–15.5)
WBC: 2.1 10*3/uL — ABNORMAL LOW (ref 4.0–10.5)
nRBC: 0 % (ref 0.0–0.2)

## 2024-03-03 LAB — RAD ONC ARIA SESSION SUMMARY
Course Elapsed Days: 32
Plan Fractions Treated to Date: 21
Plan Prescribed Dose Per Fraction: 1.8 Gy
Plan Total Fractions Prescribed: 30
Plan Total Prescribed Dose: 54 Gy
Reference Point Dosage Given to Date: 37.8 Gy
Reference Point Session Dosage Given: 1.8 Gy
Session Number: 21

## 2024-03-03 LAB — BASIC METABOLIC PANEL WITH GFR
Anion gap: 9 (ref 5–15)
BUN: 18 mg/dL (ref 8–23)
CO2: 23 mmol/L (ref 22–32)
Calcium: 9 mg/dL (ref 8.9–10.3)
Chloride: 105 mmol/L (ref 98–111)
Creatinine, Ser: 0.92 mg/dL (ref 0.44–1.00)
GFR, Estimated: >60 mL/min (ref >60)
Glucose, Bld: 111 mg/dL — ABNORMAL HIGH (ref 70–99)
Potassium: 3.7 mmol/L (ref 3.5–5.1)
Sodium: 137 mmol/L (ref 135–145)

## 2024-03-03 MED ORDER — POTASSIUM CHLORIDE CRYS ER 20 MEQ PO TBCR: 20.0000 meq | EXTENDED_RELEASE_TABLET | Freq: Every day | ORAL | 0 refills | Status: DC

## 2024-03-03 MED ORDER — ATORVASTATIN CALCIUM 40 MG PO TABS
40.0000 mg | ORAL_TABLET | Freq: Every day | ORAL | Status: DC
  Administered 2024-03-03: 40 mg via ORAL
  Filled 2024-03-03: qty 1

## 2024-03-03 MED ORDER — PANTOPRAZOLE SODIUM 40 MG PO TBEC
40.0000 mg | DELAYED_RELEASE_TABLET | Freq: Two times a day (BID) | ORAL | Status: DC
  Administered 2024-03-03: 40 mg via ORAL
  Filled 2024-03-03: qty 1

## 2024-03-03 MED ORDER — NIFEDIPINE ER 60 MG PO TB24: 60.0000 mg | ORAL_TABLET | Freq: Every day | ORAL | 0 refills | Status: DC

## 2024-03-03 MED ORDER — NITROGLYCERIN 0.4 MG SL SUBL: 0.4000 mg | SUBLINGUAL_TABLET | SUBLINGUAL | 12 refills | Status: AC | PRN

## 2024-03-03 MED ORDER — NIFEDIPINE ER OSMOTIC RELEASE 60 MG PO TB24
60.0000 mg | ORAL_TABLET | Freq: Every day | ORAL | Status: DC
  Filled 2024-03-03: qty 1

## 2024-03-03 MED ORDER — EZETIMIBE 10 MG PO TABS
10.0000 mg | ORAL_TABLET | Freq: Every day | ORAL | Status: DC
  Administered 2024-03-03: 10 mg via ORAL
  Filled 2024-03-03: qty 1

## 2024-03-03 NOTE — Progress Notes (Signed)
 NAME:  Andrea [name], MRN:  [id number], DOB:  1956/04/17, LOS: 4 ADMISSION DATE:  02/28/2024, CONSULTATION DATE:  03/02/24 REFERRING MD:  oncology/cardiology, CHIEF COMPLAINT:  chest pain   History of Present Illness:  68 yo female presented to [facility] for chemotherapy with 5-FU and mitomycin  (cycle 2 started on 02/28/24) for treatment of anal cancer. Pt unfortunately has been suffering from vasospasm 2/2 5-FU during cycle 1 she was admitted for close cardiac monitoring during this cycle. She continues on imdur , nifedipine  and dilt per cardiology recommendations. However today on d4 of cycle 2 pt began having worsening CP. Cardiology increased dosage of medications and ultimately recommended pt be transferred to [facility] on nitroglycerin  infusion. Pt has refused transfer and thus is being sent to ICU at [facility] for closer monitoring.  Oncology has stopped the 5-FU infusion. Trending troponins, thankfully EKG is non ischemic. Initiate heparin  infusion if troponin are elevated and escalating.   Ccm has been requested to be on board for  pt by cardiology. Dr Scherrie Curt, oncology, called for consult on their behalf.   Pertinent  Medical History  Anal cancer s/p resection, currently on radiation and chemo Anemia of chronic disease Chronic headaches Anxiety/depression  Significant Hospital Events: Including procedures, antibiotic start and stop dates in addition to other pertinent events   Admitted to [facility] for cardiac monitoring with 5-FU infusion 6/9 Transferred to ICU for nitroglycerin  infusion 6/12; pccm consulted  Interim History / Subjective:  No chest pain or other complaints Nitroglycerin  drip never started  Objective    Blood pressure (!) 119/54, pulse 95, temperature 98.1 F (36.7 C), temperature source Oral, resp. rate (!) 25, height 5' 5 (1.651 m), weight 75.8 kg, SpO2 99%.        Intake/Output Summary (Last 24 hours) at 03/03/2024 0723 Last data filed at 03/02/2024 1200 Gross per 24 hour   Intake 240 ml  Output --  Net 240 ml   Filed Weights   02/28/24 1835 03/02/24 2000  Weight: 78 kg 75.8 kg    Examination: General: NAD HEENT: MM pink/moist Neuro: Aox3; MAE; sedated; CV: s1s2, RRR, no m/r/g PULM:  dim clear BS bilaterally; room air GI: soft, bsx4 active  Extremities: warm/dry  Resolved problem list   Assessment and Plan   Acute CP c/w angina 2/2 5-FU infusion, h/o vasospasm 2/2 medication Anal cancer s/p resection on radiation and chemo Chronic headaches Anemia of chronic disease Perineum skin wound 2/2 radiation HLD GERD Anxiety Depression Plan: -oncology and cardiology following appreciate recs -cont cardizem , imdur , nifedipine  -prn nitroglycerin  for chest pain -cont home paxil  -resume home statin and zetia  -resume home ppi -received radiation today; chemo on hold -wound care       Best Practice (right click and Reselect all SmartList Selections daily)   Diet/type: Regular consistency (see orders) DVT prophylaxis LMWH Pressure ulcer(s): present on admission  GI prophylaxis: PPI Lines: N/A Foley:  N/A Code Status:  full code Last date of multidisciplinary goals of care discussion [per primary]  Labs   CBC: Recent Labs  Lab 02/28/24 1200 02/29/24 0345 03/01/24 1212 03/02/24 0516 03/03/24 0438  WBC 4.3 3.4* 3.8* 2.8* 2.1*  NEUTROABS 3.1 2.4 2.9 1.9 1.5*  HGB 9.3* 8.5* 9.4* 9.3* 9.6*  HCT 28.2* 25.4* 28.2* 27.5* 28.7*  MCV 87.9 86.1 88.4 86.2 88.6  PLT 213 167 176 157 150    Basic Metabolic Panel: Recent Labs  Lab 02/28/24 1200 02/29/24 0345 03/01/24 0507 03/03/24 0438  NA 136 137 140 137  K 3.7 3.3* 3.3* 3.7  CL 105 109 108 105  CO2 22 24 26 23   GLUCOSE 116* 104* 114* 111*  BUN 17 14 9 18   CREATININE 0.76 0.97 0.65 0.92  CALCIUM  8.8* 8.3* 8.9 9.0   GFR: Estimated Creatinine Clearance: 58.8 mL/min (by C-G formula based on SCr of 0.92 mg/dL). Recent Labs  Lab 02/29/24 0345 03/01/24 1212 03/02/24 0516  03/03/24 0438  WBC 3.4* 3.8* 2.8* 2.1*    Liver Function Tests: Recent Labs  Lab 02/28/24 1200 02/29/24 0345 03/01/24 0507  AST 21 20 19   ALT 17 16 16   ALKPHOS 99 86 57  BILITOT 0.5 0.7 0.6  PROT 6.3* 5.7* 5.7*  ALBUMIN 3.3* 2.9* 2.9*   No results for input(s): LIPASE, AMYLASE in the last 168 hours. No results for input(s): AMMONIA in the last 168 hours.  ABG    Component Value Date/Time   PHART 7.409 02/03/2024 1003   PCO2ART 38.5 02/03/2024 1003   PO2ART 115 (H) 02/03/2024 1003   HCO3 24.5 02/03/2024 1003   TCO2 26 02/03/2024 1003   O2SAT 99 02/03/2024 1003     Coagulation Profile: No results for input(s): INR, PROTIME in the last 168 hours.  Cardiac Enzymes: No results for input(s): CKTOTAL, CKMB, CKMBINDEX, TROPONINI in the last 168 hours.  HbA1C: Hgb A1c MFr Bld  Date/Time Value Ref Range Status  02/02/2024 04:53 PM 5.7 (H) 4.8 - 5.6 % Final    Comment:    (NOTE) Pre diabetes:          5.7%-6.4%  Diabetes:              >6.4%  Glycemic control for   <7.0% adults with diabetes     CBG: No results for input(s): GLUCAP in the last 168 hours.  Review of Systems:   As per HPI  Past Medical History:  She,  has a past medical history of Anemia, Anginal pain (HCC), Arthritis, Chest discomfort, Disorder of immune system (HCC), Dyspnea, Dyspnea on exertion, Hypercholesterolemia, Hypertension, Migraine, Non-ulcer dyspepsia, Osteopenia of spine, Peptic ulcer disease, Pneumonia, Polyp of rectum, Pre-diabetes, Skin cancer, and Ulcerative colitis (HCC).   Surgical History:   Past Surgical History:  Procedure Laterality Date   ANKLE FRACTURE SURGERY Left    CARDIAC CATHETERIZATION     COLONOSCOPY     ESOPHAGOGASTRODUODENOSCOPY     hip replacement Bilateral    LEFT HEART CATH AND CORONARY ANGIOGRAPHY N/A 01/21/2022   Procedure: LEFT HEART CATH AND CORONARY ANGIOGRAPHY;  Surgeon: Odie Benne, MD;  Location: [facility];   Service: Cardiovascular;  Laterality: N/A;   LEFT HEART CATH AND CORONARY ANGIOGRAPHY N/A 02/03/2024   Procedure: LEFT HEART CATH AND CORONARY ANGIOGRAPHY;  Surgeon: Wenona Hamilton, MD;  Location: [facility] CV [facility];  Service: Cardiovascular;  Laterality: N/A;   RECTAL BIOPSY N/A 05/04/2022   Procedure: BIOPSY RECTAL VS EXCISION OF PERIANAL LESION UNDER ANOSCOPY;  Surgeon: Melvenia Stabs, MD;  Location: [facility];  Service: General;  Laterality: N/A;   TOTAL KNEE ARTHROPLASTY Left 10/19/2022   Procedure: LEFT TOTAL KNEE ARTHROPLASTY;  Surgeon: Wes Hamman, MD;  Location: [facility] OR;  Service: Orthopedics;  Laterality: Left;   TUBAL LIGATION     TUMOR EXCISION N/A 01/14/2024   Procedure: TRANSRECTAL TUMOR EXCISION;  Surgeon: Melvenia Stabs, MD;  Location: [facility];  Service: General;  Laterality: N/A;  Transanal excision of distal rectal and anal lesion (wide local)  Social History:   reports that she has never smoked. She has never used smokeless tobacco. She reports that she does not drink alcohol and does not use drugs.   Family History:  Her family history includes CAD (age of onset: 55) in her brother; CAD (age of onset: 34) in her mother; Heart attack in her mother; Heart attack (age of onset: 44) in her father; Other in her son; Skin cancer in her brother. There is no history of Colon cancer, Esophageal cancer, Rectal cancer, or Stomach cancer.   Allergies Allergies  Allergen Reactions   Codeine Nausea And Vomiting     Home Medications  Prior to Admission medications   Medication Sig Start Date End Date Taking? Authorizing Provider  acetaminophen  (TYLENOL ) 500 MG tablet Take 1,000 mg by mouth every 6 (six) hours as needed for moderate pain.   Yes [provider]  albuterol  (VENTOLIN  HFA) 108 (90 Base) MCG/ACT inhaler Inhale 1-2 puffs into the lungs every 4 (four) hours as needed for wheezing or shortness of breath. 06/20/23  Yes Baity, Rankin Buzzard, NP   ammonium lactate (LAC-HYDRIN) 12 % lotion Apply 1 Application topically daily.   Yes [provider]  atorvastatin  (LIPITOR) 80 MG tablet TAKE ONE TABLET BY MOUTH DAILY Patient taking differently: Take 40 mg by mouth daily. 02/04/23  Yes Turner, Rufus Council, MD  colchicine  0.6 MG tablet Take 1 tablet (0.6 mg total) by mouth 2 (two) times daily. 02/04/24 03/05/24 Yes Regalado, Belkys A, MD  diltiazem  (CARDIZEM  CD) 180 MG 24 hr capsule Take 1 capsule (180 mg total) by mouth daily. 02/25/24 05/25/24 Yes Lauralee Poll, MD  diphenoxylate -atropine  (LOMOTIL ) 2.5-0.025 MG tablet Take 1-2 tablets by mouth 4 (four) times daily as needed for diarrhea or loose stools. 02/07/24  Yes Sonja Bronaugh, MD  ezetimibe  (ZETIA ) 10 MG tablet TAKE 1 TABLET BY MOUTH DAILY 04/13/23  Yes Turner, Rufus Council, MD  fluticasone  (FLONASE ) 50 MCG/ACT nasal spray Place 2 sprays into both nostrils daily. Patient taking differently: Place 2 sprays into both nostrils daily as needed for allergies. 11/26/17  Yes Yu, Amy V, PA-C  isosorbide  mononitrate (IMDUR ) 30 MG 24 hr tablet Take 1 tablet (30 mg total) by mouth at bedtime. 02/25/24  Yes Lauralee Poll, MD  nitroGLYCERIN  (NITROSTAT ) 0.4 MG SL tablet Place 1 tablet (0.4 mg total) under the tongue every 5 (five) minutes as needed for chest pain. 10/29/23 02/21/25 Yes Jacqueline Matsu, MD  ondansetron  (ZOFRAN ) 8 MG tablet Take 1 tablet (8 mg total) by mouth every 8 (eight) hours as needed for nausea or vomiting. 01/24/24  Yes Sonja Bucyrus, MD  pantoprazole  (PROTONIX ) 40 MG tablet Take 1 tablet (40 mg total) by mouth 2 (two) times daily. 02/04/24 03/05/24 Yes Regalado, Belkys A, MD  PARoxetine  (PAXIL ) 20 MG tablet Take 10 mg by mouth daily.   Yes [provider]  SUMAtriptan (IMITREX) 25 MG tablet Take 25 mg by mouth every 2 (two) hours as needed for migraine.   Yes [provider]  vedolizumab  (ENTYVIO ) 300 MG injection Inject 300 mg into the vein every 8 (eight) weeks.   Yes [provider]  magic mouthwash (nystatin , lidocaine , diphenhydrAMINE , alum & mag hydroxide) suspension Swish and swallow 5 mLs by mouth 3 (three) times daily as needed for mouth pain. Patient not taking: Reported on 02/28/2024 02/08/24   Sonja Charles, MD     Critical care time: NA    JD [facility] 03/03/2024, 9:27 AM  Please see [email or web address] for pager details.  From 7A-7P if no response, please call [phone]. After hours, please call ELink [phone].

## 2024-03-03 NOTE — Progress Notes (Signed)
RA DL PICC removed per protocol per MD order. Manual pressure applied for 3 mins. Vaseline gauze, gauze, and Tegaderm applied over insertion site. No bleeding or swelling noted. Instructed patient to remain in bed for thirty mins. Educated patient about S/S of infection and when to call MD; no heavy lifting or pressure on right side for 24 hours; keep dressing dry and intact for 24 hours. Pt verbalized comprehension.

## 2024-03-03 NOTE — Discharge Summary (Addendum)
 [facility]  Telephone:([phone] Fax:([phone]) [phone]     Physician Discharge Summary     Patient ID: Andrea [name] MRN: [id number] DOB/AGE: September 06, 1956 68 y.o.  Admit date: 02/28/2024 Discharge date: 03/03/2024  Primary Care Physician:  Alejandro Hurt, FNP  Brief H and P: For complete details please refer to admission H and P, but in brief, Andrea [name] is a 68 year old female patient with oncologic history significant for anal cancer.  She is now admitted for cycle 2 chemotherapy with 5-FU and mitomycin  started on 02/28/24.   Recommendations at discharge: Follow up with Cardiology for further care of chest pain/vasospasm 2.   Follow up with Oncology/Dr. Maryalice Smaller for further discussion of chemotherapy treatment for Anal cancer - appointment scheduled 03/06/24 at 2:15 pm.  Discharge Diagnoses:   Anal Cancer Vasospasm  Present on Admission:  Anal cancer [facility])  Coronary artery disease due to lipid rich plaque Coronary vasospasm [facility])  Hospital Course: 68 year old female patient with oncologic history significant for anal cancer. Admitted for Cycle 2 chemotherapy with continuous 5FU infusion due to history of chest pain during cycle 1 infusion. Cardiology monitored closely.  Unfortunately patient experienced chest pain on day 4 of therapy and 5FU was discontinued.     Assessment and Plan: Anal squamous cell carcinoma (HCC), cT2N1M0, stage IIIA -History of invasive squamous cell carcinoma of anus in 2023 - Status post surgical resection August 2023 - Status post recurrence with biopsy confirming invasive squamous cell carcinoma in April 2025 - Started Cycle 1 concurrent chemoradiation with 5-FU and mitomycin  01/31/2024 -- C2 chemo started 6/9 with close cardio monitoring. - 5FU infusion STOPPED on cycle 2 day 4, 03/02/24 due to significant event/multiple complaints of chest pain.  - Continue daily radiation therapy as ordered - Plan PET scan 3 months  postchemotherapy - Plan endoscopy 6 to 8 weeks posttreatment for rectal exam - Follows with medical oncology Dr. Maryalice Smaller.  Dr. Scherrie Curt covering for this hospitalization.   Chest pain during chemotherapy  recurrent - multiple episodes early morning 6/12 and evening 6/12 - During Cycle 1 of 5-FU patient complained of chest pain; likely vasospasm.  Patient again experienced chest pain morning of 6/12 and later in the evening of 6/12 despite cardiac medication increase done by Cardio team.   - EKG initially done 6/9, repeat done 6/12 and negative  - Continue cardiac medications Imdur , nifedipine  and diltiazem  as ordered, pre and post chemotherapy.  -Patient complains of 2 episodes of chest pain overnight/early hours of 6/12 this a.m. nitro SL administered with good relief.  Discussed with cardio team and cardio meds have been adjusted.   Unfortunately, patient again experienced multiple episodes of chest pain in the evening of 6/12.  Decision made to discontinue 5FU infusion.  Card/HF team recommended IV nitroglycerin  gtt and transfer to [facility] CCM. Patient refused transfer to [facility].  Therefore transferred to [facility] ICU overnight 6/12.  --Patient seen this morning 6/13 in [facility] ICU. Reports that she feels fine today and has had no further chest pain since infusion was discontinued.  -- Cardio/heart failure Drs. Monda Angry, and [place] following.    Headaches -- Ongoing -- C/o ongoing headaches; contacted cardio team 6/11, Imdur  decreased to 15 mg twice daily.  Nifedipine  increased to 60 mg daily. - Due to chest pain 6/12, Imdur  increased to 30 mg and nifedipine  increased to 90 mg, continue as ordered. -- Continue tylenol  alternating with Ibuprofen .   Skin breakdown, perineum -- secondary  to ongoing RT -- No creams per Rad Onc.   -- Continue to gently clean area with water using peri-bottle given to patient  -- Monitor closely   Anemia - Likely due to malignancy, oncologic treatments, and dilution  effect - Hemoglobin stable 9.6 today -- Transfuse PRBC for HGB<7.  No transfusion at this time planned.  - Continue to monitor CBC with differential   Anxiety/depression - States shaky feeling has resolved since initiation of Paxil  on 6/11. Fells well today.  - Follow up outpatient    Discharge Medications:  Disposition and Follow-up:   Significant Diagnostic Studies:  IR PICC PLACEMENT RIGHT >5 YRS INC IMG GUIDE Result Date: 02/28/2024 INDICATION: for chemotherapy 68 year old female with history of anal cancer. In need of durable venous access for chemotherapy. Double lumen peripherally inserted central catheter requested. EXAM: ULTRASOUND AND FLUOROSCOPIC GUIDED PICC LINE INSERTION MEDICATIONS: 2 mL 1% lidocaine  CONTRAST:  None FLUOROSCOPY: Radiation Exposure Index and estimated peak skin dose (PSD); Reference air kerma (RAK), 0.1 mGy. COMPLICATIONS: None immediate. TECHNIQUE: The procedure, risks, benefits, and alternatives were explained to the patient and informed written consent was obtained. A timeout was performed prior to the initiation of the procedure. The RIGHT upper extremity was prepped with chlorhexidine  in a sterile fashion, and a sterile drape was applied covering the operative field. Maximum barrier sterile technique with sterile gowns and gloves were used for the procedure. A timeout was performed prior to the initiation of the procedure. Local anesthesia was provided with 1% lidocaine . Under direct ultrasound guidance, the RIGHT basilic vein was accessed with a micropuncture kit after the overlying soft tissues were anesthetized with 1% lidocaine . An ultrasound image was saved for documentation purposes. A guidewire was advanced to the level of the superior caval-atrial junction for measurement purposes and the PICC line was cut to length. A peel-away sheath was placed and a 32 cm, 5 [place], dual lumen was inserted to level of the superior caval-atrial junction. A post procedure  spot fluoroscopic was obtained. The catheter easily aspirated and flushed and was sutured in place. A dressing was placed. The patient tolerated the procedure well without immediate post procedural complication. FINDINGS: After catheter placement, the tip lies within the superior cavoatrial junction. The catheter aspirates and flushes normally and is ready for immediate use. IMPRESSION: Successful ultrasound and fluoroscopic guided placement of a RIGHT basilic vein approach, 32 cm, 5 Fr , dual lumen PICC. The tip of the catheter is positioned at the superior cavo-atrial junction. The PICC line is ready for immediate use. Performed by: [name] PA-C Electronically Signed   By: Art Largo M.D.   On: 02/28/2024 10:11   VAS US  LOWER EXTREMITY VENOUS (DVT) Result Date: 02/06/2024  Lower Venous DVT Study Patient Name:  ZIANNA DERCOLE  Date of Exam:   02/04/2024 Medical Rec #: [id number]        Accession #:    [id number] Date of Birth: 1956/06/20         Patient Gender: F Patient Age:   81 years Exam Location:  [facility] Procedure:      VAS US  LOWER EXTREMITY VENOUS (DVT) Referring Phys: Clifford Dam REGALADO --------------------------------------------------------------------------------  Indications: Pain, and right upper thigh.  Risk Factors: Cancer. Anticoagulation: Lovenox . Comparison Study: 10/27/23 Performing Technologist: Franky Ivanoff Sturdivant-Jones RDMS, RVT  Examination Guidelines: A complete evaluation includes B-mode imaging, spectral Doppler, color Doppler, and power Doppler as needed of all accessible portions of each vessel. Bilateral testing is considered an integral  part of a complete examination. Limited examinations for reoccurring indications may be performed as noted. The reflux portion of the exam is performed with the patient in reverse Trendelenburg.  +---------+---------------+---------+-----------+----------+--------------+ RIGHT    CompressibilityPhasicitySpontaneityPropertiesThrombus  Aging +---------+---------------+---------+-----------+----------+--------------+ CFV      Full           Yes                                          +---------+---------------+---------+-----------+----------+--------------+ SFJ      Full                                                        +---------+---------------+---------+-----------+----------+--------------+ FV Prox  Full                                                        +---------+---------------+---------+-----------+----------+--------------+ FV Mid   Full                                                        +---------+---------------+---------+-----------+----------+--------------+ FV DistalFull                                                        +---------+---------------+---------+-----------+----------+--------------+ PFV      Full                                                        +---------+---------------+---------+-----------+----------+--------------+ POP      Full           Yes                                          +---------+---------------+---------+-----------+----------+--------------+ PTV      Full                                                        +---------+---------------+---------+-----------+----------+--------------+ PERO     Full                                                        +---------+---------------+---------+-----------+----------+--------------+   +---------+---------------+---------+-----------+----------+--------------+ LEFT     CompressibilityPhasicitySpontaneityPropertiesThrombus Aging +---------+---------------+---------+-----------+----------+--------------+  CFV      Full           Yes      Yes                                 +---------+---------------+---------+-----------+----------+--------------+ SFJ      Full                                                         +---------+---------------+---------+-----------+----------+--------------+ FV Prox  Full                                                        +---------+---------------+---------+-----------+----------+--------------+ FV Mid   Full                                                        +---------+---------------+---------+-----------+----------+--------------+ FV DistalFull                                                        +---------+---------------+---------+-----------+----------+--------------+ PFV      Full                                                        +---------+---------------+---------+-----------+----------+--------------+ POP      Full           Yes      Yes                                 +---------+---------------+---------+-----------+----------+--------------+ PTV      Full                                                        +---------+---------------+---------+-----------+----------+--------------+ PERO     Full                                                        +---------+---------------+---------+-----------+----------+--------------+     Summary: BILATERAL: - No evidence of deep vein thrombosis seen in the lower extremities, bilaterally. -No evidence of popliteal cyst, bilaterally.   *See table(s) above for measurements and observations. Electronically signed by Runell Countryman on 02/06/2024 at 9:35:02 AM.    Final    EEG adult Result Date:  02/04/2024 Eleni Griffin, MD     02/04/2024  4:03 PM Routine EEG Report LUCELIA LACEY is a 68 y.o. female with a history of myoclonus who is undergoing an EEG to evaluate for seizures. Report: This EEG was acquired with electrodes placed according to the International 10-20 electrode system (including Fp1, Fp2, F3, F4, C3, C4, P3, P4, O1, O2, T3, T4, T5, T6, A1, A2, Fz, Cz, Pz). The following electrodes were missing or displaced: none. The occipital dominant rhythm was 5-7 Hz. This  activity is reactive to stimulation. Drowsiness was manifested by background fragmentation; deeper stages of sleep were identified by K complexes and sleep spindles. There was no focal slowing. There were no interictal epileptiform discharges. There were no electrographic seizures identified. There was no abnormal response to photic stimulation or hyperventilation. Impression and clinical correlation: This EEG was obtained while awake and asleep and is abnormal due to mild-to-moderate diffuse slowing indicative of global cerebral dysfunction. Epileptiform abnormalities were not seen during this recording. Greg Leaks, MD [facility] [phone] If 7pm- 7am, please page neurology on call as listed in AMION.   CARDIAC CATHETERIZATION Result Date: 02/03/2024   Prox LAD lesion is 20% stenosed.   Prox RCA lesion is 20% stenosed. 1.  Mild nonobstructive coronary artery disease. 2.  Left ventricular angiography was not performed.  EF was normal by echo.  Moderately elevated left ventricular end-diastolic pressure at 26 mmHg. Recommendations: No culprit is identified for chest pain and elevated troponin.  Suspect myopericarditis based on symptoms and EKG changes. Colchicine  0.6 mg twice daily was added. I discontinued heparin  drip.   ECHOCARDIOGRAM COMPLETE Result Date: 02/03/2024    ECHOCARDIOGRAM REPORT   Patient Name:   CHARMAYNE ODELL Date of Exam: 02/03/2024 Medical Rec #:  [id number]       Height:       65.0 in Accession #:    [id number]      Weight:       172.0 lb Date of Birth:  1955-12-06        BSA:          1.855 m Patient Age:    68 years        BP:           110/63 mmHg Patient Gender: F               HR:           70 bpm. Exam Location:  Inpatient Procedure: 2D Echo, Color Doppler, Cardiac Doppler and Intracardiac            Opacification Agent (Both Spectral and Color Flow Doppler were            utilized during procedure). Indications:    Chest Pain  History:        Patient has prior  history of Echocardiogram examinations.                 Signs/Symptoms:Dyspnea; Risk Factors:Hypertension.  Sonographer:    Willey Harrier Referring Phys: [id number] ZANE ADAMS  Sonographer Comments: Technically difficult study due to poor echo windows. IMPRESSIONS  1. Left ventricular ejection fraction, by estimation, is 55 to 60%. The left ventricle has normal function. The left ventricle has no regional wall motion abnormalities. Left ventricular diastolic parameters were normal.  2. Right ventricular systolic function is normal. The right ventricular size is normal.  3. The mitral valve is degenerative. Trivial mitral valve regurgitation. No evidence of mitral stenosis.  4.  The aortic valve is tricuspid. Aortic valve regurgitation is not visualized. Aortic valve sclerosis/calcification is present, without any evidence of aortic stenosis. Aortic valve Vmax measures 1.35 m/s.  5. The inferior vena cava is normal in size with greater than 50% respiratory variability, suggesting right atrial pressure of 3 mmHg. FINDINGS  Left Ventricle: Left ventricular ejection fraction, by estimation, is 55 to 60%. The left ventricle has normal function. The left ventricle has no regional wall motion abnormalities. Definity  contrast agent was given IV to delineate the left ventricular  endocardial borders. The left ventricular internal cavity size was normal in size. There is no left ventricular hypertrophy. Left ventricular diastolic parameters were normal. Normal left ventricular filling pressure. Right Ventricle: The right ventricular size is normal. No increase in right ventricular wall thickness. Right ventricular systolic function is normal. Left Atrium: Left atrial size was normal in size. Right Atrium: Right atrial size was normal in size. Pericardium: There is no evidence of pericardial effusion. Mitral Valve: The mitral valve is degenerative in appearance. There is mild calcification of the mitral valve leaflet(s). Mild  mitral annular calcification. Trivial mitral valve regurgitation. No evidence of mitral valve stenosis. MV peak gradient, 4.3 mmHg. The mean mitral valve gradient is 2.0 mmHg. Tricuspid Valve: The tricuspid valve is normal in structure. Tricuspid valve regurgitation is not demonstrated. No evidence of tricuspid stenosis. Aortic Valve: The aortic valve is tricuspid. Aortic valve regurgitation is not visualized. Aortic valve sclerosis/calcification is present, without any evidence of aortic stenosis. Aortic valve peak gradient measures 7.3 mmHg. Pulmonic Valve: The pulmonic valve was normal in structure. Pulmonic valve regurgitation is not visualized. No evidence of pulmonic stenosis. Aorta: The aortic root is normal in size and structure. Venous: The inferior vena cava is normal in size with greater than 50% respiratory variability, suggesting right atrial pressure of 3 mmHg. IAS/Shunts: The interatrial septum appears to be lipomatous. No atrial level shunt detected by color flow Doppler.  LEFT VENTRICLE PLAX 2D LVIDd:         4.20 cm   Diastology LVIDs:         3.20 cm   LV e' medial:    8.27 cm/s LV PW:         0.80 cm   LV E/e' medial:  9.1 LV IVS:        0.90 cm   LV e' lateral:   7.29 cm/s LVOT diam:     2.00 cm   LV E/e' lateral: 10.3 LV SV:         64 LV SV Index:   35 LVOT Area:     3.14 cm  RIGHT VENTRICLE            IVC RV S prime:     9.25 cm/s  IVC diam: 2.50 cm TAPSE (M-mode): 2.0 cm LEFT ATRIUM             Index        RIGHT ATRIUM          Index LA Vol (A2C):   37.0 ml 19.94 ml/m  RA Area:     9.78 cm LA Vol (A4C):   26.9 ml 14.50 ml/m  RA Volume:   20.70 ml 11.16 ml/m LA Biplane Vol: 32.0 ml 17.25 ml/m  AORTIC VALVE AV Area (Vmax): 1.96 cm AV Vmax:        135.00 cm/s AV Peak Grad:   7.3 mmHg LVOT Vmax:      84.20 cm/s LVOT Vmean:  63.800 cm/s LVOT VTI:       0.204 m  AORTA Ao Root diam: 2.60 cm Ao Asc diam:  2.80 cm MITRAL VALVE MV Area (PHT): 3.05 cm    SHUNTS MV Area VTI:   2.38 cm     Systemic VTI:  0.20 m MV Peak grad:  4.3 mmHg    Systemic Diam: 2.00 cm MV Mean grad:  2.0 mmHg MV Vmax:       1.04 m/s MV Vmean:      59.2 cm/s MV Decel Time: 249 msec MV E velocity: 75.20 cm/s MV A velocity: 95.30 cm/s MV E/A ratio:  0.79 Gaylyn Keas MD Electronically signed by Gaylyn Keas MD Signature Date/Time: 02/03/2024/1:28:23 PM    Final    DG Chest Port 1 View Result Date: 02/02/2024 CLINICAL DATA:  Status post PICC line placement. EXAM: PORTABLE CHEST 1 VIEW COMPARISON:  June 20, 2023 FINDINGS: A right-sided PICC line is seen with its distal tip approximately 1.8 cm distal to the junction of the superior vena cava and right atrium. The heart size and mediastinal contours are within normal limits. There is moderate severity calcification of the aortic arch. Stable mild to moderate severity biapical scarring and/or atelectasis is seen. No pleural effusion or pneumothorax is identified. The visualized skeletal structures are unremarkable. IMPRESSION: Right-sided PICC line positioning, as described above. Electronically Signed   By: Virgle Grime M.D.   On: 02/02/2024 23:24   CT Angio Chest PE W and/or Wo Contrast Result Date: 02/02/2024 CLINICAL DATA:  Chest pain. EXAM: CT ANGIOGRAPHY CHEST WITH CONTRAST TECHNIQUE: Multidetector CT imaging of the chest was performed using the standard protocol during bolus administration of intravenous contrast. Multiplanar CT image reconstructions and MIPs were obtained to evaluate the vascular anatomy. RADIATION DOSE REDUCTION: This exam was performed according to the departmental dose-optimization program which includes automated exposure control, adjustment of the mA and/or kV according to patient size and/or use of iterative reconstruction technique. CONTRAST:  65mL OMNIPAQUE  IOHEXOL  350 MG/ML SOLN COMPARISON:  Jan 25, 2024. FINDINGS: Cardiovascular: Satisfactory opacification of the pulmonary arteries to the segmental level. No evidence of pulmonary  embolism. Normal heart size. No pericardial effusion. Mediastinum/Nodes: No enlarged mediastinal, hilar, or axillary lymph nodes. Thyroid gland, trachea, and esophagus demonstrate no significant findings. Lungs/Pleura: No pneumothorax or pleural effusion is noted. Mild biapical scarring is noted. Upper Abdomen: No acute abnormality. Musculoskeletal: No chest wall abnormality. No acute or significant osseous findings. Review of the MIP images confirms the above findings. IMPRESSION: No definite evidence of pulmonary embolus. Electronically Signed   By: Rosalene Colon M.D.   On: 02/02/2024 13:14    Discharge Laboratory Values: Lab Results  Component Value Date   WBC 2.1 (L) 03/03/2024   HGB 9.6 (L) 03/03/2024   HCT 28.7 (L) 03/03/2024   MCV 88.6 03/03/2024   PLT 150 03/03/2024      Physical Exam at Discharge: BP (!) 132/55   Pulse 96   Temp 97.7 F (36.5 C) (Oral)   Resp (!) 26   Ht 5' 5 (1.651 m)   Wt 167 lb 1.7 oz (75.8 kg)   SpO2 99%   BMI 27.81 kg/m  GENERAL: alert, no distress and comfortable SKIN: skin color, texture, turgor are normal, no rashes or significant lesions EYES: normal, conjunctiva are pink and non-injected, sclera clear OROPHARYNX: no exudate, no erythema and lips, buccal mucosa, and tongue normal  NECK: supple, thyroid normal size, non-tender, without nodularity LYMPH: no palpable lymphadenopathy in  the cervical, axillary or inguinal LUNGS: clear to auscultation and percussion with normal breathing effort HEART: regular rate & rhythm and no murmurs and no lower extremity edema ABDOMEN: abdomen soft, non-tender and normal bowel sounds MUSCULOSKELETAL: no cyanosis of digits and no clubbing  PSYCH: alert & oriented x 3 with fluent speech NEURO: no focal motor/sensory deficits      Diet:  Regular  Activity:  As tolerated  Condition at Discharge:   Stable  Signed:  [phone]  03/03/2024, 11:17 AM   Andrea [name] was interviewed and examined on the  day of discharge.  She was without chest pain and appeared stable for discharge to home with continuation of medical prophylaxis as recommended by cardiology.  She had 2 episodes of chest pain in the early a.m. 03/02/2024.  The pain responded to nitroglycerin .  Imdur  and nifedipine  doses were increased.  She developed recurrent chest pain on the evening of 03/02/2024.  The 5-FU drip was discontinued.  She was transferred to the ICU for monitoring.  Cardiology recommended a nitroglycerin  drip, but this was not started as the chest pain resolved.  She had no further chest pain.  I discussed the risk/benefit of continuing 5-fluorouracil  with Andrea [name] and her son on the morning of 03/03/2024.  She has completed greater than 72 hours of the planned 96-hour infusion.  I do not recommend a rechallenge with 5-fluorouracil  as she experienced chest pain despite medical prophylaxis and dose escalation of nitroglycerin  and a calcium  channel blocker on 03/02/2024.  She will without pain on the morning of 03/03/2024.  She will be discharged home after completing radiation.  She will follow-up with Dr. Maryalice Smaller as scheduled.

## 2024-03-03 NOTE — Discharge Instructions (Signed)
 Call for fever, bleeding, or chest pain

## 2024-03-03 NOTE — Plan of Care (Signed)
  Problem: Education: Goal: Knowledge of General Education information will improve Description: Including pain rating scale, medication(s)/side effects and non-pharmacologic comfort measures Outcome: Adequate for Discharge   Problem: Health Behavior/Discharge Planning: Goal: Ability to manage health-related needs will improve Outcome: Adequate for Discharge   Problem: Clinical Measurements: Goal: Ability to maintain clinical measurements within normal limits will improve Outcome: Adequate for Discharge Goal: Will remain free from infection Outcome: Adequate for Discharge Goal: Diagnostic test results will improve Outcome: Adequate for Discharge Goal: Respiratory complications will improve Outcome: Adequate for Discharge Goal: Cardiovascular complication will be avoided Outcome: Adequate for Discharge   Problem: Activity: Goal: Risk for activity intolerance will decrease Outcome: Adequate for Discharge   Problem: Nutrition: Goal: Adequate nutrition will be maintained Outcome: Adequate for Discharge   Problem: Coping: Goal: Level of anxiety will decrease Outcome: Adequate for Discharge   Problem: Elimination: Goal: Will not experience complications related to bowel motility Outcome: Adequate for Discharge Goal: Will not experience complications related to urinary retention Outcome: Adequate for Discharge   Problem: Pain Managment: Goal: General experience of comfort will improve and/or be controlled Outcome: Adequate for Discharge   Problem: Safety: Goal: Ability to remain free from injury will improve Outcome: Adequate for Discharge   Problem: Skin Integrity: Goal: Risk for impaired skin integrity will decrease Outcome: Adequate for Discharge   Problem: Education: Goal: Knowledge of the prescribed therapeutic regimen will improve Outcome: Adequate for Discharge   Problem: Activity: Goal: Ability to implement measures to reduce episodes of fatigue will  improve Outcome: Adequate for Discharge   Problem: Bowel/Gastric: Goal: Will not experience complications related to bowel motility Outcome: Adequate for Discharge   Problem: Coping: Goal: Ability to identify and develop effective coping behavior will improve Outcome: Adequate for Discharge   Problem: Nutritional: Goal: Maintenance of adequate nutrition will improve Outcome: Adequate for Discharge

## 2024-03-05 ENCOUNTER — Other Ambulatory Visit: Payer: Self-pay | Admitting: Nurse Practitioner

## 2024-03-05 DIAGNOSIS — C210 Malignant neoplasm of anus, unspecified: Secondary | ICD-10-CM

## 2024-03-05 NOTE — Progress Notes (Unsigned)
 Patient Care Team: Andrea Hurt, FNP as PCP - General (Family Medicine) Andrea Matsu, MD as PCP - Cardiology (Cardiology) Andrea Cellar, RN as Oncology Nurse Navigator Andrea Hayden, MD as Consulting Physician (Hematology and Oncology)  Clinic Day:  03/06/2024  Referring physician: Alejandro Hurt, FNP  ASSESSMENT & PLAN:   Assessment & Plan: Anal squamous cell carcinoma (HCC) cT2N1M0, stage IIIA -previous history of superficially invasive squamous cell carcinoma of anus, status post surgical resection in August 2023, presented with rectal discomfort for several months, and a palpable 2 to 3 cm mass in the anal canal, biopsy confirmed invasive squamous cell carcinoma in April 2025.  -PET Jan 25, 2024 showed long segment anorectal hypermetabolic activity, and hypermetabolic right inguinal and external iliac lymph nodes metastasis.  No distant metastasis. -she started concurrent chemoradiation with 5-FU and mitomycin  on Jan 31, 2024. -she was hospitalized for pericarditis on 02/02/2024, clinically suspicious for 5-fu induced coronary artery spasm  - Hospitalized from 02/28/2024 through 03/03/2024 for second round intravenous chemotherapy with mitomycin  and 5-FU.  She did have several episodes of chest discomfort while receiving 5-FU infusion.  This had to be stopped 1 day early due to significant and persistent chest discomfort.  She states she has not had problems with chest discomfort or chest pain since released from hospital. -Continue radiation treatments as scheduled.  She has 7 more treatments left.  Repeat labs and follow-up on 03/16/2024 as scheduled.   Coronary artery spasms  Occurred while hospitalized for chemotherapy infusion and 5-FU.  Had to stop 5-FU infusion after day 4 due to persistent chest discomfort.  Patient has not had any chest pain or discomfort since discharge from hospital on 03/03/2024.  Acid reflux Patient reports having severe acid reflux since starting  concurrent chemoradiation.  She is taking pantoprazole  daily.  Does not feel like it is helping.  Add Pepcid  20 mg twice daily as needed.  Advised that this medication is something that will not interfere with other medications and that she should take only on as needed basis.  Sent prescription to [facility].  Oral mucositis Patient with small blister to the corner of her mouth on the left side.  No blisters or mucous membrane breakdown inside her mouth.  Renew prescription for Magic mouthwash and sent to [facility].  Encouraged her to rub a small amount into affected area to help soothe and heal.  Pancytopenia Improving since hospital discharge.  Normal WBC at 4.2 and ANC at 3.3.  Persistent reduction of Hgb at 9.2 and HCT 26.8.  Platelets reduced at 126.  No evidence of abnormal bleeding.  No blood in the stool, hematemesis or epistaxis.  Repeat labs as scheduled on 03/16/2024.  Hypokalemia Mild with potassium at 3.4.  She does have prescription for potassium supplement.  Has not had medications yet today.  She is having persistent diarrhea which is likely contributing to hypokalemia.  Recheck with lab appointment 03/16/2024.  Adjust prescription as indicated.  Diarrhea Frequent and described as moderate to severe.  Causing significant rectal/anal pain.  She is using Lomotil  and Imodium as needed and as indicated.  Likely contributing to hypokalemia.  Encouraged her to discuss with Dr. Jeryl [name] and his team, and a cream or barrier that may be used to reduce rectal/anal pain.  Plan Labs reviewed. - Improved and normal WBC and ANC.  Stable and slightly lower Hgb, HCT, and platelet counts. - Mild hypokalemia with remainder of CMP unremarkable. -CEA pending. Patient  reports 7 remaining radiation treatments.  She has completed second cycle of chemotherapy with mitomycin  and 5-FU infusion. Keep all radiation appointments as scheduled. Labs and follow-up as scheduled.  The patient  understands the plans discussed today and is in agreement with them.  She knows to contact our office if she develops concerns prior to her next appointment.  I provided 25 minutes of face-to-face time during this encounter and > 50% was spent counseling as documented under my assessment and plan.    Andrea Deis, NP  [facility] - A DEPT OF [facility]. [facility] [address] [place] [address] Dept: [phone] Dept Fax: [phone]   No orders of the defined types were placed in this encounter.     CHIEF COMPLAINT:  CC: Anal squamous cell carcinoma  Current Treatment: Concurrent chemoradiation  INTERVAL HISTORY:  Andrea [name] is here today for repeat clinical assessment.  She was last seen by Andrea [name] on 02/22/2024.  Recently hospitalized from 02/28/2024 through 03/03/2024 for chemotherapy treatment with mitomycin  and 5-FU infusions.  Hospitalized as there was strong suspicion that 5-FU infusion causing pericarditis.  Patient did have several episodes of chest pain while hospitalized for second round of chemotherapy.  Chemotherapy had to be aborted 1 day early.  Patient states that she has not had chest pain or discomfort since discontinuation of 5-FU infusion.  She has been having significant heartburn.  Currently taking pantoprazole  daily.  Reports not really helping.  She also has a sore to the left corner of her mouth.  This is on the external surface.  No internal mucous membranes in the mouth are involved this time.  She also continues to have moderate to severe diarrhea.  This is causing her significant rectal/anal pain.  She understands this is combination effect of chemotherapy and radiation.  Has only 7 radiation treatments left.  Since discharge from the hospital, she denies chest pain, chest pressure, or shortness of breath. She denies headaches or visual disturbances. She denies abdominal pain, nausea, or vomiting. She  denies fevers or chills. She denies pain. Her appetite is fair. Her weight has been stable.  I have reviewed the past medical history, past surgical history, social history and family history with the patient and they are unchanged from previous note.  ALLERGIES:  is allergic to codeine.  MEDICATIONS:  Current Outpatient Medications  Medication Sig Dispense Refill   acetaminophen  (TYLENOL ) 500 MG tablet Take 1,000 mg by mouth every 6 (six) hours as needed for moderate pain.     albuterol  (VENTOLIN  HFA) 108 (90 Base) MCG/ACT inhaler Inhale 1-2 puffs into the lungs every 4 (four) hours as needed for wheezing or shortness of breath. 8 g 0   ammonium lactate (LAC-HYDRIN) 12 % lotion Apply 1 Application topically daily.     atorvastatin  (LIPITOR) 80 MG tablet TAKE ONE TABLET BY MOUTH DAILY 90 tablet 1   diltiazem  (CARDIZEM  CD) 180 MG 24 hr capsule Take 1 capsule (180 mg total) by mouth daily. 90 capsule 3   diphenoxylate -atropine  (LOMOTIL ) 2.5-0.025 MG tablet Take 1-2 tablets by mouth 4 (four) times daily as needed for diarrhea or loose stools. 60 tablet 0   ezetimibe  (ZETIA ) 10 MG tablet TAKE 1 TABLET BY MOUTH DAILY 90 tablet 3   famotidine  (PEPCID ) 20 MG tablet Take 1 tablet (20 mg total) by mouth 2 (two) times daily as needed for heartburn or indigestion. 60 tablet 1   fluticasone  (  FLONASE ) 50 MCG/ACT nasal spray Place 2 sprays into both nostrils daily. 1 g 0   NIFEdipine  (ADALAT  CC) 60 MG 24 hr tablet Take 1 tablet (60 mg total) by mouth daily. 30 tablet 0   nitroGLYCERIN  (NITROSTAT ) 0.4 MG SL tablet Place 1 tablet (0.4 mg total) under the tongue every 5 (five) minutes as needed for chest pain. 20 tablet 12   ondansetron  (ZOFRAN ) 8 MG tablet Take 1 tablet (8 mg total) by mouth every 8 (eight) hours as needed for nausea or vomiting. 30 tablet 1   pantoprazole  (PROTONIX ) 40 MG tablet Take 1 tablet (40 mg total) by mouth 2 (two) times daily. 60 tablet 0   PARoxetine  (PAXIL ) 20 MG tablet Take 10 mg  by mouth daily.     potassium chloride  SA (KLOR-CON  M) 20 MEQ tablet Take 1 tablet (20 mEq total) by mouth daily. 30 tablet 0   SUMAtriptan (IMITREX) 25 MG tablet Take 25 mg by mouth every 2 (two) hours as needed for migraine.     magic mouthwash (nystatin , lidocaine , diphenhydrAMINE , alum & mag hydroxide) suspension Swish and swallow 5 mLs by mouth 3 (three) times daily as needed for mouth pain. 140 mL 1   vedolizumab  (ENTYVIO ) 300 MG injection Inject 300 mg into the vein every 8 (eight) weeks.     No current facility-administered medications for this visit.    HISTORY OF PRESENT ILLNESS:   Oncology History  Anal squamous cell carcinoma (HCC)  12/29/2023 Pathology Results   A. PERIANAL, RIGHT POSTERIOR, BIOPSY:       Invasive squamous cell carcinoma, moderately differentiated. Suspicious for lymphovascular invasion.  B. PERIANAL, LEFT POSTERIOR, BIOPSY:       High-grade squamous intraepithelial lesion (HSIL / AIN 3).  No evidence of definitive invasion.    01/20/2024 Initial Diagnosis   Anal cancer (HCC)   01/26/2024 Cancer Staging   Staging form: Anus, AJCC 8th Edition - Clinical: Stage IIIA (cT2, cN1c, cM0) - Signed by Andrea , MD on 01/26/2024   01/31/2024 -  Chemotherapy   Patient is on Treatment Plan : ANUS Mitomycin  D1,28 + 5FU D1-4, 28-31 q32d         REVIEW OF SYSTEMS:   Constitutional: Denies fevers, chills or abnormal weight loss. Moderate fatigue  Eyes: Denies blurriness of vision Ears, nose, mouth, throat, and face: Denies mucositis or sore throat Respiratory: Denies cough, dyspnea or wheezes Cardiovascular: Denies palpitation, chest discomfort or lower extremity swelling Gastrointestinal:  Denies nausea or vomiting.  She continues to have pretty persistent diarrhea causing rectal and anal pain.  Also having heartburn despite taking pantoprazole  every day. Skin: Denies abnormal skin rashes Lymphatics: Denies new lymphadenopathy or easy bruising Neurological:Denies  numbness, tingling or new weaknesses Behavioral/Psych: Mood is stable, no new changes  All other systems were reviewed with the patient and are negative.   VITALS:   Today's Vitals   03/06/24 1428 03/06/24 1430  BP: 138/68   Pulse: 97   Resp: 17   Temp: 97.9 F (36.6 C)   TempSrc: Temporal   SpO2: 98%   Weight: 166 lb 9.6 oz (75.6 kg)   Height: 5' 5 (1.651 m)   PainSc:  0-No pain   Body mass index is 27.72 kg/m.   Wt Readings from Last 3 Encounters:  03/06/24 166 lb 9.6 oz (75.6 kg)  03/02/24 167 lb 1.7 oz (75.8 kg)  02/28/24 172 lb (78 kg)    Body mass index is 27.72 kg/m.  Performance status (ECOG): 1 -  Symptomatic but completely ambulatory  PHYSICAL EXAM:   GENERAL:alert, no distress and comfortable SKIN: skin color, texture, turgor are normal, no rashes or significant lesions EYES: normal, Conjunctiva are pink and non-injected, sclera clear OROPHARYNX:no exudate, no erythema and lips, buccal mucosa, and tongue normal.  Very small lesion to the left corner of her mouth.  Some redness and irritation present. NECK: supple, thyroid normal size, non-tender, without nodularity LYMPH:  no palpable lymphadenopathy in the cervical, axillary or inguinal LUNGS: clear to auscultation and percussion with normal breathing effort HEART: regular rate & rhythm and no murmurs and no lower extremity edema ABDOMEN:abdomen soft with normal bowel sounds.  Slight tenderness with deep palpation. Musculoskeletal:no cyanosis of digits and no clubbing  NEURO: alert & oriented x 3 with fluent speech, no focal motor/sensory deficits  LABORATORY DATA:  I have reviewed the data as listed    Component Value Date/Time   NA 138 03/06/2024 1404   NA 140 01/15/2022 0907   K 3.4 (L) 03/06/2024 1404   CL 109 03/06/2024 1404   CO2 23 03/06/2024 1404   GLUCOSE 120 (H) 03/06/2024 1404   BUN 21 03/06/2024 1404   BUN 15 01/15/2022 0907   CREATININE 0.86 03/06/2024 1404   CALCIUM  8.8 (L)  03/06/2024 1404   PROT 6.3 (L) 03/06/2024 1404   PROT 6.8 10/10/2019 1030   ALBUMIN 3.7 03/06/2024 1404   ALBUMIN 4.4 10/10/2019 1030   AST 23 03/06/2024 1404   ALT 30 03/06/2024 1404   ALKPHOS 53 03/06/2024 1404   BILITOT 0.4 03/06/2024 1404   GFRNONAA >60 03/06/2024 1404   GFRAA 84 07/06/2019 1601   Lab Results  Component Value Date   WBC 4.2 03/06/2024   NEUTROABS 3.3 03/06/2024   HGB 9.2 (L) 03/06/2024   HCT 26.8 (L) 03/06/2024   MCV 84.0 03/06/2024   PLT 126 (L) 03/06/2024    RADIOGRAPHIC STUDIES: IR PICC PLACEMENT RIGHT >5 YRS INC IMG GUIDE Result Date: 02/28/2024 INDICATION: for chemotherapy 68 year old female with history of anal cancer. In need of durable venous access for chemotherapy. Double lumen peripherally inserted central catheter requested. EXAM: ULTRASOUND AND FLUOROSCOPIC GUIDED PICC LINE INSERTION MEDICATIONS: 2 mL 1% lidocaine  CONTRAST:  None FLUOROSCOPY: Radiation Exposure Index and estimated peak skin dose (PSD); Reference air kerma (RAK), 0.1 mGy. COMPLICATIONS: None immediate. TECHNIQUE: The procedure, risks, benefits, and alternatives were explained to the patient and informed written consent was obtained. A timeout was performed prior to the initiation of the procedure. The RIGHT upper extremity was prepped with chlorhexidine  in a sterile fashion, and a sterile drape was applied covering the operative field. Maximum barrier sterile technique with sterile gowns and gloves were used for the procedure. A timeout was performed prior to the initiation of the procedure. Local anesthesia was provided with 1% lidocaine . Under direct ultrasound guidance, the RIGHT basilic vein was accessed with a micropuncture kit after the overlying soft tissues were anesthetized with 1% lidocaine . An ultrasound image was saved for documentation purposes. A guidewire was advanced to the level of the superior caval-atrial junction for measurement purposes and the PICC line was cut to length.  A peel-away sheath was placed and a 32 cm, 5 [place], dual lumen was inserted to level of the superior caval-atrial junction. A post procedure spot fluoroscopic was obtained. The catheter easily aspirated and flushed and was sutured in place. A dressing was placed. The patient tolerated the procedure well without immediate post procedural complication. FINDINGS: After catheter placement, the tip  lies within the superior cavoatrial junction. The catheter aspirates and flushes normally and is ready for immediate use. IMPRESSION: Successful ultrasound and fluoroscopic guided placement of a RIGHT basilic vein approach, 32 cm, 5 Fr , dual lumen PICC. The tip of the catheter is positioned at the superior cavo-atrial junction. The PICC line is ready for immediate use. Performed by: [name] PA-C Electronically Signed   By: Art Largo M.D.   On: 02/28/2024 10:11

## 2024-03-05 NOTE — Assessment & Plan Note (Signed)
 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  -PET Jan 25, 2024 showed long segment anorectal hypermetabolic activity, and hypermetabolic right inguinal and external iliac lymph nodes metastasis.  No distant metastasis. -she started concurrent chemoradiation with 5-FU and mitomycin  on Jan 31, 2024. -she was hospitalized for pericarditis on 02/02/2024, clinically suspicious for 5-fu induced coronary artery spasm  - Hospitalized from 02/28/2024 through 03/03/2024 for second round intravenous chemotherapy with mitomycin  and 5-FU.  She did have several episodes of chest discomfort while receiving 5-FU infusion.  This had to be stopped 1 day early due to significant and persistent chest discomfort.  She states she has not had problems with chest discomfort or chest pain since released from hospital. -Continue radiation treatments as scheduled.  She has 7 more treatments left.  Repeat labs and follow-up on 03/16/2024 as scheduled.

## 2024-03-06 ENCOUNTER — Encounter: Payer: Self-pay | Admitting: Nurse Practitioner

## 2024-03-06 ENCOUNTER — Inpatient Hospital Stay (HOSPITAL_BASED_OUTPATIENT_CLINIC_OR_DEPARTMENT_OTHER): Admitting: Nurse Practitioner

## 2024-03-06 ENCOUNTER — Other Ambulatory Visit (HOSPITAL_COMMUNITY): Payer: Self-pay

## 2024-03-06 ENCOUNTER — Other Ambulatory Visit: Payer: Self-pay | Admitting: Hematology

## 2024-03-06 ENCOUNTER — Inpatient Hospital Stay

## 2024-03-06 ENCOUNTER — Other Ambulatory Visit: Payer: Self-pay

## 2024-03-06 ENCOUNTER — Ambulatory Visit
Admission: RE | Admit: 2024-03-06 | Discharge: 2024-03-06 | Disposition: A | Discharge status: Home / Self Care | Source: Ambulatory Visit | Attending: Radiation Oncology

## 2024-03-06 VITALS — BP 138/68 | HR 97 | Temp 97.9°F | Resp 17 | Ht 65.0 in | Wt 166.6 lb

## 2024-03-06 DIAGNOSIS — Z510 Encounter for antineoplastic radiation therapy: Secondary | ICD-10-CM | POA: Diagnosis present

## 2024-03-06 DIAGNOSIS — Z79899 Other long term (current) drug therapy: Secondary | ICD-10-CM | POA: Diagnosis not present

## 2024-03-06 DIAGNOSIS — K1379 Other lesions of oral mucosa: Secondary | ICD-10-CM | POA: Diagnosis not present

## 2024-03-06 DIAGNOSIS — E876 Hypokalemia: Secondary | ICD-10-CM | POA: Diagnosis not present

## 2024-03-06 DIAGNOSIS — C210 Malignant neoplasm of anus, unspecified: Secondary | ICD-10-CM | POA: Diagnosis present

## 2024-03-06 DIAGNOSIS — R0789 Other chest pain: Secondary | ICD-10-CM | POA: Diagnosis not present

## 2024-03-06 DIAGNOSIS — R197 Diarrhea, unspecified: Secondary | ICD-10-CM | POA: Diagnosis not present

## 2024-03-06 DIAGNOSIS — K219 Gastro-esophageal reflux disease without esophagitis: Secondary | ICD-10-CM | POA: Diagnosis not present

## 2024-03-06 DIAGNOSIS — I739 Peripheral vascular disease, unspecified: Secondary | ICD-10-CM | POA: Diagnosis not present

## 2024-03-06 DIAGNOSIS — D61818 Other pancytopenia: Secondary | ICD-10-CM | POA: Diagnosis not present

## 2024-03-06 LAB — CMP (CANCER CENTER ONLY)
ALT: 30 U/L (ref 0–44)
AST: 23 U/L (ref 15–41)
Albumin: 3.7 g/dL (ref 3.5–5.0)
Alkaline Phosphatase: 53 U/L (ref 38–126)
Anion gap: 6 (ref 5–15)
BUN: 21 mg/dL (ref 8–23)
CO2: 23 mmol/L (ref 22–32)
Calcium: 8.8 mg/dL — ABNORMAL LOW (ref 8.9–10.3)
Chloride: 109 mmol/L (ref 98–111)
Creatinine: 0.86 mg/dL (ref 0.44–1.00)
GFR, Estimated: >60 mL/min (ref >60)
Glucose, Bld: 120 mg/dL — ABNORMAL HIGH (ref 70–99)
Potassium: 3.4 mmol/L — ABNORMAL LOW (ref 3.5–5.1)
Sodium: 138 mmol/L (ref 135–145)
Total Bilirubin: 0.4 mg/dL (ref 0.0–1.2)
Total Protein: 6.3 g/dL — ABNORMAL LOW (ref 6.5–8.1)

## 2024-03-06 LAB — CBC WITH DIFFERENTIAL (CANCER CENTER ONLY)
Abs Immature Granulocytes: 0.08 10*3/uL — ABNORMAL HIGH (ref 0.00–0.07)
Basophils Absolute: 0 10*3/uL (ref 0.0–0.1)
Basophils Relative: 1 %
Eosinophils Absolute: 0.4 10*3/uL (ref 0.0–0.5)
Eosinophils Relative: 9 %
HCT: 26.8 % — ABNORMAL LOW (ref 36.0–46.0)
Hemoglobin: 9.2 g/dL — ABNORMAL LOW (ref 12.0–15.0)
Immature Granulocytes: 2 %
Lymphocytes Relative: 5 %
Lymphs Abs: 0.2 10*3/uL — ABNORMAL LOW (ref 0.7–4.0)
MCH: 28.8 pg (ref 26.0–34.0)
MCHC: 34.3 g/dL (ref 30.0–36.0)
MCV: 84 fL (ref 80.0–100.0)
Monocytes Absolute: 0.3 10*3/uL (ref 0.1–1.0)
Monocytes Relative: 6 %
Neutro Abs: 3.3 10*3/uL (ref 1.7–7.7)
Neutrophils Relative %: 77 %
Platelet Count: 126 10*3/uL — ABNORMAL LOW (ref 150–400)
RBC: 3.19 MIL/uL — ABNORMAL LOW (ref 3.87–5.11)
RDW: 14.4 % (ref 11.5–15.5)
WBC Count: 4.2 10*3/uL (ref 4.0–10.5)
nRBC: 0 % (ref 0.0–0.2)

## 2024-03-06 LAB — RAD ONC ARIA SESSION SUMMARY
Course Elapsed Days: 35
Plan Fractions Treated to Date: 22
Plan Prescribed Dose Per Fraction: 1.8 Gy
Plan Total Fractions Prescribed: 30
Plan Total Prescribed Dose: 54 Gy
Reference Point Dosage Given to Date: 39.6 Gy
Reference Point Session Dosage Given: 1.8 Gy
Session Number: 22

## 2024-03-06 LAB — CEA (ACCESS): CEA (CHCC): 6.3 ng/mL — ABNORMAL HIGH (ref 0.00–5.00)

## 2024-03-06 MED ORDER — FAMOTIDINE 20 MG PO TABS
20.0000 mg | ORAL_TABLET | Freq: Two times a day (BID) | ORAL | 1 refills | Status: AC | PRN
  Filled 2024-03-06: qty 60, 30d supply, fill #0
  Filled 2024-07-01: qty 60, 30d supply, fill #1

## 2024-03-06 MED ORDER — NYSTATIN 100000 UNIT/ML MT SUSP: 5.0000 mL | Freq: Three times a day (TID) | OROMUCOSAL | 1 refills | Status: DC | PRN

## 2024-03-07 ENCOUNTER — Encounter: Payer: Self-pay | Admitting: Nurse Practitioner

## 2024-03-07 ENCOUNTER — Encounter: Payer: Self-pay | Admitting: Gastroenterology

## 2024-03-07 ENCOUNTER — Other Ambulatory Visit: Payer: Self-pay

## 2024-03-07 ENCOUNTER — Ambulatory Visit
Admission: RE | Admit: 2024-03-07 | Discharge: 2024-03-07 | Disposition: A | Discharge status: Home / Self Care | Source: Ambulatory Visit | Attending: Radiation Oncology | Admitting: Radiation Oncology

## 2024-03-07 ENCOUNTER — Other Ambulatory Visit (HOSPITAL_COMMUNITY): Payer: Self-pay

## 2024-03-07 ENCOUNTER — Ambulatory Visit

## 2024-03-07 ENCOUNTER — Encounter: Payer: Self-pay | Admitting: Hematology

## 2024-03-07 DIAGNOSIS — Z510 Encounter for antineoplastic radiation therapy: Secondary | ICD-10-CM | POA: Diagnosis not present

## 2024-03-07 LAB — RAD ONC ARIA SESSION SUMMARY
Course Elapsed Days: 36
Plan Fractions Treated to Date: 23
Plan Prescribed Dose Per Fraction: 1.8 Gy
Plan Total Fractions Prescribed: 30
Plan Total Prescribed Dose: 54 Gy
Reference Point Dosage Given to Date: 41.4 Gy
Reference Point Session Dosage Given: 1.8 Gy
Session Number: 23

## 2024-03-07 MED ORDER — NYSTATIN 100000 UNIT/ML MT SUSP
5.0000 mL | Freq: Three times a day (TID) | OROMUCOSAL | 1 refills | Status: DC | PRN
  Filled 2024-03-07: qty 140, 10d supply, fill #0

## 2024-03-07 MED ORDER — LIDOCAINE VISCOUS HCL 2 % MT SOLN: 5.0000 mL | Freq: Three times a day (TID) | OROMUCOSAL | 1 refills | Status: DC | PRN

## 2024-03-07 MED ORDER — NYSTATIN 100000 UNIT/ML MT SUSP: 5.0000 mL | Freq: Three times a day (TID) | OROMUCOSAL | 1 refills | Status: DC | PRN

## 2024-03-08 ENCOUNTER — Telehealth: Payer: Self-pay | Admitting: Gastroenterology

## 2024-03-08 ENCOUNTER — Other Ambulatory Visit (HOSPITAL_COMMUNITY): Payer: Self-pay

## 2024-03-08 ENCOUNTER — Other Ambulatory Visit: Payer: Self-pay

## 2024-03-08 ENCOUNTER — Ambulatory Visit
Admission: RE | Admit: 2024-03-08 | Discharge: 2024-03-08 | Disposition: A | Discharge status: Home / Self Care | Source: Ambulatory Visit | Attending: Radiation Oncology | Admitting: Radiation Oncology

## 2024-03-08 DIAGNOSIS — Z510 Encounter for antineoplastic radiation therapy: Secondary | ICD-10-CM | POA: Diagnosis not present

## 2024-03-08 LAB — RAD ONC ARIA SESSION SUMMARY
Course Elapsed Days: 37
Plan Fractions Treated to Date: 24
Plan Prescribed Dose Per Fraction: 1.8 Gy
Plan Total Fractions Prescribed: 30
Plan Total Prescribed Dose: 54 Gy
Reference Point Dosage Given to Date: 43.2 Gy
Reference Point Session Dosage Given: 1.8 Gy
Session Number: 24

## 2024-03-08 NOTE — Telephone Encounter (Signed)
 The pt states she is undergoing cancer treatment and not sure if she should be continuing with Entyvio .  She is due in the next few weeks.  Please advise

## 2024-03-08 NOTE — Telephone Encounter (Signed)
 Inbound call from patient stating she was scheduled for an Entyvio  infusion on 6/17 but unsure if she should proceed with infusions. Requesting a call back to discuss further. Please advise, thank you.

## 2024-03-09 ENCOUNTER — Inpatient Hospital Stay

## 2024-03-09 ENCOUNTER — Ambulatory Visit
Admission: RE | Admit: 2024-03-09 | Discharge: 2024-03-09 | Disposition: A | Discharge status: Home / Self Care | Source: Ambulatory Visit | Attending: Radiation Oncology

## 2024-03-09 ENCOUNTER — Other Ambulatory Visit: Payer: Self-pay

## 2024-03-09 ENCOUNTER — Other Ambulatory Visit (HOSPITAL_COMMUNITY): Payer: Self-pay

## 2024-03-09 ENCOUNTER — Other Ambulatory Visit: Payer: Self-pay | Admitting: Radiation Oncology

## 2024-03-09 ENCOUNTER — Ambulatory Visit

## 2024-03-09 ENCOUNTER — Inpatient Hospital Stay: Admitting: Nurse Practitioner

## 2024-03-09 DIAGNOSIS — Z510 Encounter for antineoplastic radiation therapy: Secondary | ICD-10-CM | POA: Diagnosis not present

## 2024-03-09 LAB — RAD ONC ARIA SESSION SUMMARY
Course Elapsed Days: 38
Plan Fractions Treated to Date: 25
Plan Prescribed Dose Per Fraction: 1.8 Gy
Plan Total Fractions Prescribed: 30
Plan Total Prescribed Dose: 54 Gy
Reference Point Dosage Given to Date: 45 Gy
Reference Point Session Dosage Given: 1.8 Gy
Session Number: 25

## 2024-03-09 MED ORDER — OXYCODONE HCL 5 MG PO TABS: 2.5000 mg | ORAL_TABLET | ORAL | 0 refills | Status: DC | PRN

## 2024-03-09 NOTE — Telephone Encounter (Signed)
 The pt has been advised and will continue Entyvio  as ordered

## 2024-03-09 NOTE — Telephone Encounter (Signed)
 Thank you for the note, and I have been kept informed about her cancer treatment from her care team.  Yes, I recommend she continue the Entyvio  infusions regularly as scheduled. I also conveyed that to members of her care team in some previous staff messages.  Memory Staggers MD

## 2024-03-09 NOTE — Addendum Note (Signed)
 Encounter addended by: Karolynn Pack on: 03/09/2024 12:32 PM  Actions taken: Imaging Exam ended

## 2024-03-10 ENCOUNTER — Other Ambulatory Visit: Payer: Self-pay

## 2024-03-10 ENCOUNTER — Ambulatory Visit
Admission: RE | Admit: 2024-03-10 | Discharge: 2024-03-10 | Disposition: A | Discharge status: Home / Self Care | Source: Ambulatory Visit | Attending: Radiation Oncology | Admitting: Radiation Oncology

## 2024-03-10 DIAGNOSIS — Z510 Encounter for antineoplastic radiation therapy: Secondary | ICD-10-CM | POA: Diagnosis not present

## 2024-03-10 LAB — RAD ONC ARIA SESSION SUMMARY
Course Elapsed Days: 39
Plan Fractions Treated to Date: 26
Plan Prescribed Dose Per Fraction: 1.8 Gy
Plan Total Fractions Prescribed: 30
Plan Total Prescribed Dose: 54 Gy
Reference Point Dosage Given to Date: 46.8 Gy
Reference Point Session Dosage Given: 1.8 Gy
Session Number: 26

## 2024-03-13 ENCOUNTER — Ambulatory Visit

## 2024-03-13 ENCOUNTER — Ambulatory Visit
Admission: RE | Admit: 2024-03-13 | Discharge: 2024-03-13 | Disposition: A | Discharge status: Home / Self Care | Source: Ambulatory Visit | Attending: Radiation Oncology | Admitting: Radiation Oncology

## 2024-03-13 ENCOUNTER — Other Ambulatory Visit: Payer: Self-pay

## 2024-03-13 DIAGNOSIS — Z510 Encounter for antineoplastic radiation therapy: Secondary | ICD-10-CM | POA: Diagnosis not present

## 2024-03-13 LAB — RAD ONC ARIA SESSION SUMMARY
Course Elapsed Days: 42
Plan Fractions Treated to Date: 27
Plan Prescribed Dose Per Fraction: 1.8 Gy
Plan Total Fractions Prescribed: 30
Plan Total Prescribed Dose: 54 Gy
Reference Point Dosage Given to Date: 48.6 Gy
Reference Point Session Dosage Given: 1.8 Gy
Session Number: 27

## 2024-03-13 NOTE — Progress Notes (Unsigned)
 [facility]   [phone] [phone]    Patient Care Team: Marvene Prentice SAUNDERS, FNP as PCP - General (Family Medicine) Shlomo Wilbert SAUNDERS, MD as PCP - Cardiology (Cardiology) Ardis, Evalene CROME, RN as Oncology Nurse Navigator Lanny Callander, MD as Consulting Physician (Hematology and Oncology)   CHIEF COMPLAINT: Follow up anal cancer   Oncology History  Anal squamous cell carcinoma (HCC)  12/29/2023 Pathology Results   A. PERIANAL, RIGHT POSTERIOR, BIOPSY:       Invasive squamous cell carcinoma, moderately differentiated. Suspicious for lymphovascular invasion.  B. PERIANAL, LEFT POSTERIOR, BIOPSY:       High-grade squamous intraepithelial lesion (HSIL / AIN 3).  No evidence of definitive invasion.    01/20/2024 Initial Diagnosis   Anal cancer (HCC)   01/26/2024 Cancer Staging   Staging form: Anus, AJCC 8th Edition - Clinical: Stage IIIA (cT2, cN1c, cM0) - Signed by Lanny Callander, MD on 01/26/2024   01/31/2024 -  Chemotherapy   Patient is on Treatment Plan : ANUS Mitomycin  D1,28 + 5FU D1-4, 28-31 q32d        CURRENT THERAPY: Concurrent chemoRT with 5FU/mitomycin   INTERVAL HISTORY Andrea [name] returns for follow up as scheduled. Last seen by Powell Lessen, NP 03/06/24. She continues RT, to complete this Thursday.  Reporting severe rectal pain a 10 out of 10, and worse with bowel movements, with persistent abdominal cramping.  She does not have much control over bowel movements, they come when they want to, which are soft or loose but not watery and up to 4-5 episodes per day.  She is taking Imodium which helps.  She is applying A&D ointment per radiation and has tried to manage with Tylenol  but that is not effective.  She has taken oxycodone  twice, half tab and full tab.  Also has vaginal irritation during urination, has not tried Azo yet.  Denies frequency/urgency, fever or chills.  Denies recurrent chest pain episodes since completing chemo.  Family is encouraging  her to stay with them.    ROS  All other systems reviewed and negative  Past Medical History:  Diagnosis Date   Anemia    Anginal pain (HCC)    Arthritis    Chest discomfort    Disorder of immune system (HCC)    Dyspnea    Dyspnea on exertion    Hypercholesterolemia    Hypertension    Migraine    Non-ulcer dyspepsia    Osteopenia of spine    Peptic ulcer disease    Pneumonia    Polyp of rectum    cancerous   Pre-diabetes    Skin cancer    Ulcerative colitis (HCC)      Past Surgical History:  Procedure Laterality Date   ANKLE FRACTURE SURGERY Left    CARDIAC CATHETERIZATION     COLONOSCOPY     ESOPHAGOGASTRODUODENOSCOPY     hip replacement Bilateral    LEFT HEART CATH AND CORONARY ANGIOGRAPHY N/A 01/21/2022   Procedure: LEFT HEART CATH AND CORONARY ANGIOGRAPHY;  Surgeon: Verlin Lonni BIRCH, MD;  Location: [facility];  Service: Cardiovascular;  Laterality: N/A;   LEFT HEART CATH AND CORONARY ANGIOGRAPHY N/A 02/03/2024   Procedure: LEFT HEART CATH AND CORONARY ANGIOGRAPHY;  Surgeon: Darron Deatrice LABOR, MD;  Location: [facility];  Service: Cardiovascular;  Laterality: N/A;   RECTAL BIOPSY N/A 05/04/2022   Procedure: BIOPSY RECTAL VS EXCISION OF PERIANAL LESION UNDER ANOSCOPY;  Surgeon: Teresa Lonni HERO,  MD;  Location: [facility];  Service: General;  Laterality: N/A;   TOTAL KNEE ARTHROPLASTY Left 10/19/2022   Procedure: LEFT TOTAL KNEE ARTHROPLASTY;  Surgeon: Jerri Kay HERO, MD;  Location: [facility];  Service: Orthopedics;  Laterality: Left;   TUBAL LIGATION     TUMOR EXCISION N/A 01/14/2024   Procedure: TRANSRECTAL TUMOR EXCISION;  Surgeon: Teresa Lonni HERO, MD;  Location: [facility];  Service: General;  Laterality: N/A;  Transanal excision of distal rectal and anal lesion (wide local)     Outpatient Encounter Medications as of 03/14/2024  Medication Sig Note   acetaminophen  (TYLENOL ) 500 MG tablet Take 1,000 mg by mouth every 6 (six) hours  as needed for moderate pain.    albuterol  (VENTOLIN  HFA) 108 (90 Base) MCG/ACT inhaler Inhale 1-2 puffs into the lungs every 4 (four) hours as needed for wheezing or shortness of breath.    ammonium lactate (LAC-HYDRIN) 12 % lotion Apply 1 Application topically daily.    atorvastatin  (LIPITOR) 80 MG tablet TAKE ONE TABLET BY MOUTH DAILY    dicyclomine (BENTYL) 10 MG capsule Take 1 capsule (10 mg total) by mouth 3 times/day as needed-between meals & bedtime for spasms.    diltiazem  (CARDIZEM  CD) 180 MG 24 hr capsule Take 1 capsule (180 mg total) by mouth daily.    diphenoxylate -atropine  (LOMOTIL ) 2.5-0.025 MG tablet Take 1-2 tablets by mouth 4 (four) times daily as needed for diarrhea or loose stools.    ezetimibe  (ZETIA ) 10 MG tablet TAKE 1 TABLET BY MOUTH DAILY    famotidine  (PEPCID ) 20 MG tablet Take 1 tablet (20 mg total) by mouth 2 (two) times daily as needed for heartburn or indigestion.    fluticasone  (FLONASE ) 50 MCG/ACT nasal spray Place 2 sprays into both nostrils daily.    magic mouthwash (nystatin , lidocaine , diphenhydrAMINE , alum & mag hydroxide) suspension Swish and swallow 5 mLs by mouth 3 (three) times daily as needed for mouth pain.    NIFEdipine  (ADALAT  CC) 60 MG 24 hr tablet Take 1 tablet (60 mg total) by mouth daily.    nitroGLYCERIN  (NITROSTAT ) 0.4 MG SL tablet Place 1 tablet (0.4 mg total) under the tongue every 5 (five) minutes as needed for chest pain.    ondansetron  (ZOFRAN ) 8 MG tablet Take 1 tablet (8 mg total) by mouth every 8 (eight) hours as needed for nausea or vomiting.    oxyCODONE  (OXY IR/ROXICODONE ) 5 MG immediate release tablet Take 0.5-1 tablets (2.5-5 mg total) by mouth every 4 (four) hours as needed for severe pain (pain score 7-10).    pantoprazole  (PROTONIX ) 40 MG tablet Take 1 tablet (40 mg total) by mouth 2 (two) times daily.    PARoxetine  (PAXIL ) 20 MG tablet Take 10 mg by mouth daily.    potassium chloride  SA (KLOR-CON  M) 20 MEQ tablet Take 1 tablet (20  mEq total) by mouth daily.    SUMAtriptan (IMITREX) 25 MG tablet Take 25 mg by mouth every 2 (two) hours as needed for migraine.    vedolizumab  (ENTYVIO ) 300 MG injection Inject 300 mg into the vein every 8 (eight) weeks. 02/28/2024: Next infusion next Tuesday.   No facility-administered encounter medications on file as of 03/14/2024.     Today's Vitals   03/14/24 1027 03/14/24 1103  BP: (!) 130/58   Pulse: 74   Resp: 17   Temp: 97.7 F (36.5 C)   SpO2: 96%   Weight: 167 lb 12.8 oz (76.1 kg)   PainSc:  10-Worst pain ever  Body mass index is 27.92 kg/m.   ECOG PERFORMANCE STATUS: 1-2  PHYSICAL EXAM GENERAL:alert, no distress and comfortable SKIN: no rash  EYES: sclera clear LUNGS: clear with normal breathing effort HEART: regular rate & rhythm, no lower extremity edema ABDOMEN: abdomen soft, non-tender and normal bowel sounds Rectal: External exam reveals moist desquamation and ulceration along the gluteal cleft including the labia NEURO: alert & oriented x 3 with fluent speech, no focal motor/sensory deficits   CBC    Latest Ref Rng & Units 03/14/2024   10:06 AM 03/06/2024    2:04 PM 03/03/2024    4:38 AM  CBC  WBC 4.0 - 10.5 K/uL 2.3  4.2  2.1   Hemoglobin 12.0 - 15.0 g/dL 8.5  9.2  9.6   Hematocrit 36.0 - 46.0 % 24.4  26.8  28.7   Platelets 150 - 400 K/uL 125  126  150       CMP     Latest Ref Rng & Units 03/14/2024   10:06 AM 03/06/2024    2:04 PM 03/03/2024    4:38 AM  CMP  Glucose 70 - 99 mg/dL 873  879  888   BUN 8 - 23 mg/dL 12  21  18    Creatinine 0.44 - 1.00 mg/dL 8.96  9.13  9.07   Sodium 135 - 145 mmol/L 138  138  137   Potassium 3.5 - 5.1 mmol/L 3.5  3.4  3.7   Chloride 98 - 111 mmol/L 104  109  105   CO2 22 - 32 mmol/L 28  23  23    Calcium  8.9 - 10.3 mg/dL 8.7  8.8  9.0   Total Protein 6.5 - 8.1 g/dL 5.9  6.3    Total Bilirubin 0.0 - 1.2 mg/dL 0.4  0.4    Alkaline Phos 38 - 126 U/L 48  53    AST 15 - 41 U/L 13  23    ALT 0 - 44 U/L 10  30         ASSESSMENT & PLAN:  Anal squamous cell carcinoma, cT2N1M0, stage IIIA -previous history of superficially invasive squamous cell carcinoma of anus, status post surgical resection in August 2023, presented with rectal discomfort for several months, and a palpable 2 to 3 cm mass in the anal canal, biopsy confirmed invasive squamous cell carcinoma in April 2025.  -PET Jan 25, 2024 showed long segment anorectal hypermetabolic activity, and hypermetabolic right inguinal and external iliac lymph nodes metastasis.  No distant metastasis. -she started concurrent chemoradiation with 5-FU and mitomycin  on Jan 31, 2024. -she was hospitalized for pericarditis on 02/02/2024, clinically suspicious for 5-fu induced coronary artery spasm  - Hospitalized from 02/28/2024 through 03/03/2024 for second round intravenous chemotherapy with mitomycin  and 5-FU.  She did have several episodes of chest discomfort while receiving 5-FU infusion.  This had to be stopped 1 day early due to significant and persistent chest discomfort.  She states she has not had problems with chest discomfort or chest pain since released from hospital. She completed second dose chemo -Ms. Archibald appears stable. Appears to have recovered from chemo. Still with mild abdominal cramping and severe perineal pain from RT. Reviewed symptom management and encouraged her to stay with family while she recovers. Continue topicals per rad onc, oxycodone  PRN, and will send in bentyl for cramping.  -Labs reviewed, mild pancytopenia from chemoRT. Monitor  -To complete RT 03/16/24 -She prefers to keep f/up 7/1 prior to leaving town   PLAN: -  Labs reviewed, mild pancytopenia from chemoRT; reviewed precautions -Pain management  -Rx: Bentyl  -F/up 7/1 as scheduled  -F/up with Dr. Teresa 7/21 as scheduled  -CC note to cardiology, no f/up on file     All questions were answered. The patient knows to call the clinic with any problems, questions or concerns. No  barriers to learning were detected.   Guiliana Shor K Hakan Nudelman, NP 03/14/2024

## 2024-03-14 ENCOUNTER — Ambulatory Visit

## 2024-03-14 ENCOUNTER — Inpatient Hospital Stay (HOSPITAL_BASED_OUTPATIENT_CLINIC_OR_DEPARTMENT_OTHER): Admitting: Nurse Practitioner

## 2024-03-14 ENCOUNTER — Ambulatory Visit
Admission: RE | Admit: 2024-03-14 | Discharge: 2024-03-14 | Disposition: A | Discharge status: Home / Self Care | Payer: Self-pay | Source: Ambulatory Visit | Attending: Radiation Oncology | Admitting: Radiation Oncology

## 2024-03-14 ENCOUNTER — Encounter: Payer: Self-pay | Admitting: Nurse Practitioner

## 2024-03-14 ENCOUNTER — Inpatient Hospital Stay

## 2024-03-14 ENCOUNTER — Telehealth: Payer: Self-pay

## 2024-03-14 ENCOUNTER — Other Ambulatory Visit: Payer: Self-pay

## 2024-03-14 VITALS — BP 130/58 | HR 74 | Temp 97.7°F | Resp 17 | Wt 167.8 lb

## 2024-03-14 DIAGNOSIS — C210 Malignant neoplasm of anus, unspecified: Secondary | ICD-10-CM

## 2024-03-14 DIAGNOSIS — Z510 Encounter for antineoplastic radiation therapy: Secondary | ICD-10-CM | POA: Diagnosis not present

## 2024-03-14 LAB — CBC WITH DIFFERENTIAL (CANCER CENTER ONLY)
Abs Immature Granulocytes: 0.01 10*3/uL (ref 0.00–0.07)
Basophils Absolute: 0 10*3/uL (ref 0.0–0.1)
Basophils Relative: 1 %
Eosinophils Absolute: 0.1 10*3/uL (ref 0.0–0.5)
Eosinophils Relative: 5 %
HCT: 24.4 % — ABNORMAL LOW (ref 36.0–46.0)
Hemoglobin: 8.5 g/dL — ABNORMAL LOW (ref 12.0–15.0)
Immature Granulocytes: 0 %
Lymphocytes Relative: 10 %
Lymphs Abs: 0.2 10*3/uL — ABNORMAL LOW (ref 0.7–4.0)
MCH: 29.2 pg (ref 26.0–34.0)
MCHC: 34.8 g/dL (ref 30.0–36.0)
MCV: 83.8 fL (ref 80.0–100.0)
Monocytes Absolute: 0.8 10*3/uL (ref 0.1–1.0)
Monocytes Relative: 33 %
Neutro Abs: 1.2 10*3/uL — ABNORMAL LOW (ref 1.7–7.7)
Neutrophils Relative %: 51 %
Platelet Count: 125 10*3/uL — ABNORMAL LOW (ref 150–400)
RBC: 2.91 MIL/uL — ABNORMAL LOW (ref 3.87–5.11)
RDW: 15.3 % (ref 11.5–15.5)
WBC Count: 2.3 10*3/uL — ABNORMAL LOW (ref 4.0–10.5)
nRBC: 0 % (ref 0.0–0.2)

## 2024-03-14 LAB — RAD ONC ARIA SESSION SUMMARY
Course Elapsed Days: 43
Plan Fractions Treated to Date: 28
Plan Prescribed Dose Per Fraction: 1.8 Gy
Plan Total Fractions Prescribed: 30
Plan Total Prescribed Dose: 54 Gy
Reference Point Dosage Given to Date: 50.4 Gy
Reference Point Session Dosage Given: 1.8 Gy
Session Number: 28

## 2024-03-14 LAB — CMP (CANCER CENTER ONLY)
ALT: 10 U/L (ref 0–44)
AST: 13 U/L — ABNORMAL LOW (ref 15–41)
Albumin: 3.2 g/dL — ABNORMAL LOW (ref 3.5–5.0)
Alkaline Phosphatase: 48 U/L (ref 38–126)
Anion gap: 6 (ref 5–15)
BUN: 12 mg/dL (ref 8–23)
CO2: 28 mmol/L (ref 22–32)
Calcium: 8.7 mg/dL — ABNORMAL LOW (ref 8.9–10.3)
Chloride: 104 mmol/L (ref 98–111)
Creatinine: 1.03 mg/dL — ABNORMAL HIGH (ref 0.44–1.00)
GFR, Estimated: 59 mL/min — ABNORMAL LOW (ref >60)
Glucose, Bld: 126 mg/dL — ABNORMAL HIGH (ref 70–99)
Potassium: 3.5 mmol/L (ref 3.5–5.1)
Sodium: 138 mmol/L (ref 135–145)
Total Bilirubin: 0.4 mg/dL (ref 0.0–1.2)
Total Protein: 5.9 g/dL — ABNORMAL LOW (ref 6.5–8.1)

## 2024-03-14 LAB — CEA (ACCESS): CEA (CHCC): 5.1 ng/mL — ABNORMAL HIGH (ref 0.00–5.00)

## 2024-03-14 MED ORDER — DICYCLOMINE HCL 10 MG PO CAPS: 10.0000 mg | ORAL_CAPSULE | Freq: Two times a day (BID) | ORAL | 0 refills | Status: DC | PRN

## 2024-03-14 NOTE — Telephone Encounter (Signed)
 Called patient due to not showing up for her 845 lab app and her 930 app with Lacie Burton. Left a message on her VM that we would cancel her app and have scheduling to call her to reschedule. Will no show her app for today.

## 2024-03-15 ENCOUNTER — Other Ambulatory Visit: Payer: Self-pay

## 2024-03-15 ENCOUNTER — Ambulatory Visit
Admission: RE | Admit: 2024-03-15 | Discharge: 2024-03-15 | Discharge status: Home / Self Care | Source: Ambulatory Visit | Attending: Radiation Oncology

## 2024-03-15 ENCOUNTER — Ambulatory Visit

## 2024-03-15 DIAGNOSIS — Z510 Encounter for antineoplastic radiation therapy: Secondary | ICD-10-CM | POA: Diagnosis not present

## 2024-03-15 LAB — RAD ONC ARIA SESSION SUMMARY
Course Elapsed Days: 44
Plan Fractions Treated to Date: 29
Plan Prescribed Dose Per Fraction: 1.8 Gy
Plan Total Fractions Prescribed: 30
Plan Total Prescribed Dose: 54 Gy
Reference Point Dosage Given to Date: 52.2 Gy
Reference Point Session Dosage Given: 1.8 Gy
Session Number: 29

## 2024-03-16 ENCOUNTER — Other Ambulatory Visit: Payer: Self-pay

## 2024-03-16 ENCOUNTER — Ambulatory Visit
Admission: RE | Admit: 2024-03-16 | Discharge: 2024-03-16 | Disposition: A | Discharge status: Home / Self Care | Source: Ambulatory Visit | Attending: Radiation Oncology | Admitting: Radiation Oncology

## 2024-03-16 ENCOUNTER — Ambulatory Visit

## 2024-03-16 VITALS — BP 122/69 | HR 82 | Temp 97.7°F | Resp 18 | Ht 65.0 in | Wt 168.8 lb

## 2024-03-16 DIAGNOSIS — Z510 Encounter for antineoplastic radiation therapy: Secondary | ICD-10-CM | POA: Diagnosis not present

## 2024-03-16 DIAGNOSIS — K5130 Ulcerative (chronic) rectosigmoiditis without complications: Secondary | ICD-10-CM

## 2024-03-16 LAB — RAD ONC ARIA SESSION SUMMARY
Course Elapsed Days: 45
Plan Fractions Treated to Date: 30
Plan Prescribed Dose Per Fraction: 1.8 Gy
Plan Total Fractions Prescribed: 30
Plan Total Prescribed Dose: 54 Gy
Reference Point Dosage Given to Date: 54 Gy
Reference Point Session Dosage Given: 1.8 Gy
Session Number: 30

## 2024-03-16 MED ORDER — VEDOLIZUMAB 300 MG IV SOLR
300.0000 mg | Freq: Once | INTRAVENOUS | Status: AC
  Administered 2024-03-16: 300 mg via INTRAVENOUS
  Filled 2024-03-16: qty 5

## 2024-03-16 NOTE — Progress Notes (Signed)
 Diagnosis: Ulcerative Rectosigmoiditid  Provider:  Praveen Mannam MD  Procedure: IV Infusion  IV Type: Peripheral, IV Location: R Hand  Entyvio  (Vedolizumab ), Dose: 300 mg  Infusion Start Time: 1544  Infusion Stop Time: 1618  Post Infusion IV Care: Peripheral IV Discontinued  Discharge: Condition: Good, Destination: Home . AVS Declined  Performed by:  Demarian Epps, RN

## 2024-03-17 ENCOUNTER — Other Ambulatory Visit (HOSPITAL_COMMUNITY): Payer: Self-pay

## 2024-03-17 ENCOUNTER — Other Ambulatory Visit: Payer: Self-pay

## 2024-03-17 NOTE — Radiation Completion Notes (Addendum)
  Radiation Oncology         [phone] ________________________________  Name: Andrea [name] MRN: [id number]  Date of Service: 03/16/2024  DOB: 08-10-1956  End of Treatment Note   Diagnosis: Squamous cell carcinoma of the anus with a history of AIN III   Intent: Curative     ==========DELIVERED PLANS==========  First Treatment Date: 2024-01-31 Last Treatment Date: 2024-03-16   Plan Name: Anus Site: Anus Technique: IMRT Mode: Photon Dose Per Fraction: 1.8 Gy Prescribed Dose (Delivered / Prescribed): 54 Gy / 54 Gy Prescribed Fxs (Delivered / Prescribed): 30 / 30     ==========ON TREATMENT VISIT DATES========== 2024-02-08, 2024-02-11, 2024-02-18, 2024-02-25, 2024-03-03, 2024-03-10    See weekly On Treatment Notes in Epic for details in the Media tab (listed as Progress notes on the On Treatment Visit Dates listed above). The patient tolerated radiation. She developed fatigue and anticipated skin changes in the treatment field. She also had intermittent abdominal cramping and pain and was given Oxycodone . She also had episodes of loose stool alternating firm constipating stool.   The patient will receive a call in about one month from the radiation oncology department. She will continue follow up with Dr. Teresa, Dr. Lanny, and Dr. Legrand as well as her pelvic floor physical therapists.      Donald KYM Husband, PAC

## 2024-03-20 NOTE — Assessment & Plan Note (Signed)
 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  -PET Jan 25, 2024 showed long segment anorectal hypermetabolic activity, and hypermetabolic right inguinal and external iliac lymph nodes metastasis.  No distant metastasis. -she started concurrent chemoradiation with 5-FU and mitomycin  on Jan 31, 2024. -she was hospitalized for pericarditis on 02/02/2024, clinically suspicious for 5-fu induced coronary artery spasm. Chemo held on day 4 -She was admitted to hospital for cycle 2 chemotherapy with cardiology on board, she still developed a significant recurrent chest pain and 5-FU infusion was stopped on day 4 -RT completed on 03/16/24

## 2024-03-21 ENCOUNTER — Other Ambulatory Visit (HOSPITAL_COMMUNITY): Payer: Self-pay

## 2024-03-21 ENCOUNTER — Other Ambulatory Visit: Payer: Self-pay

## 2024-03-21 ENCOUNTER — Inpatient Hospital Stay

## 2024-03-21 ENCOUNTER — Inpatient Hospital Stay: Attending: Hematology | Admitting: Hematology

## 2024-03-21 VITALS — BP 142/68 | HR 108 | Temp 98.6°F | Resp 16 | Ht 65.0 in | Wt 167.9 lb

## 2024-03-21 DIAGNOSIS — C210 Malignant neoplasm of anus, unspecified: Secondary | ICD-10-CM | POA: Diagnosis present

## 2024-03-21 DIAGNOSIS — Z79899 Other long term (current) drug therapy: Secondary | ICD-10-CM | POA: Insufficient documentation

## 2024-03-21 DIAGNOSIS — K5900 Constipation, unspecified: Secondary | ICD-10-CM | POA: Diagnosis not present

## 2024-03-21 DIAGNOSIS — D6481 Anemia due to antineoplastic chemotherapy: Secondary | ICD-10-CM | POA: Insufficient documentation

## 2024-03-21 DIAGNOSIS — R197 Diarrhea, unspecified: Secondary | ICD-10-CM | POA: Diagnosis not present

## 2024-03-21 DIAGNOSIS — I10 Essential (primary) hypertension: Secondary | ICD-10-CM | POA: Insufficient documentation

## 2024-03-21 LAB — CBC WITH DIFFERENTIAL (CANCER CENTER ONLY)
Abs Immature Granulocytes: 0.02 10*3/uL (ref 0.00–0.07)
Basophils Absolute: 0 10*3/uL (ref 0.0–0.1)
Basophils Relative: 0 %
Eosinophils Absolute: 0 10*3/uL (ref 0.0–0.5)
Eosinophils Relative: 1 %
HCT: 25.4 % — ABNORMAL LOW (ref 36.0–46.0)
Hemoglobin: 8.9 g/dL — ABNORMAL LOW (ref 12.0–15.0)
Immature Granulocytes: 1 %
Lymphocytes Relative: 9 %
Lymphs Abs: 0.3 10*3/uL — ABNORMAL LOW (ref 0.7–4.0)
MCH: 29.3 pg (ref 26.0–34.0)
MCHC: 35 g/dL (ref 30.0–36.0)
MCV: 83.6 fL (ref 80.0–100.0)
Monocytes Absolute: 0.5 10*3/uL (ref 0.1–1.0)
Monocytes Relative: 14 %
Neutro Abs: 2.8 10*3/uL (ref 1.7–7.7)
Neutrophils Relative %: 75 %
Platelet Count: 148 10*3/uL — ABNORMAL LOW (ref 150–400)
RBC: 3.04 MIL/uL — ABNORMAL LOW (ref 3.87–5.11)
RDW: 15.9 % — ABNORMAL HIGH (ref 11.5–15.5)
WBC Count: 3.7 10*3/uL — ABNORMAL LOW (ref 4.0–10.5)
nRBC: 0 % (ref 0.0–0.2)

## 2024-03-21 LAB — CMP (CANCER CENTER ONLY)
ALT: 13 U/L (ref 0–44)
AST: 19 U/L (ref 15–41)
Albumin: 3.4 g/dL — ABNORMAL LOW (ref 3.5–5.0)
Alkaline Phosphatase: 61 U/L (ref 38–126)
Anion gap: 8 (ref 5–15)
BUN: 10 mg/dL (ref 8–23)
CO2: 24 mmol/L (ref 22–32)
Calcium: 8.9 mg/dL (ref 8.9–10.3)
Chloride: 109 mmol/L (ref 98–111)
Creatinine: 0.78 mg/dL (ref 0.44–1.00)
GFR, Estimated: >60 mL/min (ref >60)
Glucose, Bld: 120 mg/dL — ABNORMAL HIGH (ref 70–99)
Potassium: 3.5 mmol/L (ref 3.5–5.1)
Sodium: 141 mmol/L (ref 135–145)
Total Bilirubin: 0.4 mg/dL (ref 0.0–1.2)
Total Protein: 6.1 g/dL — ABNORMAL LOW (ref 6.5–8.1)

## 2024-03-21 LAB — CEA (ACCESS): CEA (CHCC): 6.09 ng/mL — ABNORMAL HIGH (ref 0.00–5.00)

## 2024-03-21 MED ORDER — SILVER SULFADIAZINE 1 % EX CREA: 1.0000 | TOPICAL_CREAM | Freq: Three times a day (TID) | CUTANEOUS | 1 refills | Status: DC | PRN

## 2024-03-21 MED ORDER — GERHARDT'S BUTT CREAM: 1.0000 | TOPICAL_CREAM | Freq: Two times a day (BID) | CUTANEOUS | 1 refills | Status: DC

## 2024-03-21 MED ORDER — GERHARDT'S BUTT CREAM
1.0000 | TOPICAL_CREAM | Freq: Two times a day (BID) | CUTANEOUS | 1 refills | Status: DC
Start: 2024-03-21 — End: 2024-03-21

## 2024-03-21 NOTE — Progress Notes (Signed)
 Received call from [facility] regarding prescription for Nystatin  Gerhardt Butt Cream.  Pharmacist needed clarification.  Spoke with Dr. Lanny and she decided to discontinue the Nystatin  Gerhardt Butt Cream.  Notified Pharmacist of discontinuation of Nystatin  Cream.  Verbal order w/readback from Dr. Lanny for Silver Sulfadiazine Cream 50gm tube TID PRN to buttock area with 1ea refill.  Verbal order given to pharmacist and electronic order sent to [facility].  Pt was currently in pharmacy at time to pick up prescription.

## 2024-03-21 NOTE — Progress Notes (Signed)
 [facility]   Telephone:([phone]) [phone] Fax:([phone]   Clinic Follow up Note   Patient Care Team: Marvene Prentice SAUNDERS, FNP as PCP - General (Family Medicine) Shlomo Wilbert SAUNDERS, MD as PCP - Cardiology (Cardiology) Ardis Evalene CROME, RN as Oncology Nurse Navigator Lanny Callander, MD as Consulting Physician (Hematology and Oncology)  Date of Service:  03/21/2024  CHIEF COMPLAINT: f/u of anal cancer  CURRENT THERAPY:  Recovering from chemoradiation  Oncology History   Anal squamous cell carcinoma (HCC) cT2N1M0, stage IIIA -previous history of superficially invasive squamous cell carcinoma of anus, status post surgical resection in August 2023, presented with rectal discomfort for several months, and a palpable 2 to 3 cm mass in the anal canal, biopsy confirmed invasive squamous cell carcinoma in April 2025.  -PET Jan 25, 2024 showed long segment anorectal hypermetabolic activity, and hypermetabolic right inguinal and external iliac lymph nodes metastasis.  No distant metastasis. -she started concurrent chemoradiation with 5-FU and mitomycin  on Jan 31, 2024. -she was hospitalized for pericarditis on 02/02/2024, clinically suspicious for 5-fu induced coronary artery spasm. Chemo held on day 4 -She was admitted to hospital for cycle 2 chemotherapy with cardiology on board, she still developed a significant recurrent chest pain and 5-FU infusion was stopped on day 4 -RT completed on 03/16/24  Assessment & Plan Anal cancer Completed chemoradiation for anal cancer with approximately 80% of planned chemotherapy dose due to coronary artery spasm. No further chest pain post-discharge. Left anal area remains raw, right side healing. Bowel movements irregular with constipation followed by diarrhea. Surveillance phase initiated. - Order PET scan in three months to assess treatment response. - Schedule follow-up with colorectal surgeon, Dr. Teresa, on July 21. - Begin pelvic floor physical  therapy. - Advise high protein and high calorie diet to regain strength. - Encourage regular exercise, including pelvic exercises and walking. - Advise against swimming in pools or ocean until fully healed. - Use sunscreen and protective clothing to avoid sun exposure. - Monitor bowel movements and use stool softeners or Imodium as needed.  Coronary artery spasm Coronary artery spasm during chemotherapy, likely due to prolonged infusion and reduced cardiac medication dosage. Managed with nitroglycerin . No further chest pain after discharge. Approximately 80% of chemotherapy dose received, deemed sufficient to avoid further cardiac risk. - Schedule follow-up with cardiologist, Dr. Zenaida, to review hospital events and future management. - Ensure resumption of blood pressure medication under cardiologist's guidance.  Anemia due to chemotherapy Anemia secondary to chemotherapy with hemoglobin at 8.9, slightly improved from previous week. No need for blood transfusion. - Recommend prenatal multivitamins with iron and B12 for three months. - Monitor blood counts in four to six weeks.  Diarrhea and constipation Alternating constipation and diarrhea with initial hard stools followed by diarrhea. - Advise use of stool softeners for constipation. - Use Imodium for diarrhea. - Encourage increased fluid intake.  Hypertension Hypertension management complicated by coronary artery spasm during chemotherapy. Blood pressure medication was adjusted during treatment. - Coordinate with cardiologist, Dr. Shlomo, to resume blood pressure medication. - Schedule cardiology follow-up within two months.   Plan [facility] course reviewed including lab - She is recovering from chemoradiation - She is scheduled to see colorectal surgeon Dr. Teresa in 3 to 4 weeks - Follow-up in 3 months with lab and PET scan a week prior   SUMMARY OF ONCOLOGIC HISTORY: Oncology History  Anal squamous cell carcinoma (HCC)   12/29/2023 Pathology Results   A. PERIANAL, RIGHT POSTERIOR, BIOPSY:  Invasive squamous cell carcinoma, moderately differentiated. Suspicious for lymphovascular invasion.  B. PERIANAL, LEFT POSTERIOR, BIOPSY:       High-grade squamous intraepithelial lesion (HSIL / AIN 3).  No evidence of definitive invasion.    01/20/2024 Initial Diagnosis   Anal cancer (HCC)   01/26/2024 Cancer Staging   Staging form: Anus, AJCC 8th Edition - Clinical: Stage IIIA (cT2, cN1c, cM0) - Signed by Lanny Callander, MD on 01/26/2024   01/31/2024 -  Chemotherapy   Patient is on Treatment Plan : ANUS Mitomycin  D1,28 + 5FU D1-4, 28-31 q32d        Discussed the use of AI scribe software for clinical note transcription with the patient, who gave verbal consent to proceed.  History of Present Illness Andrea [name] is a 68 year old female with anal cancer who presents for follow-up after completing chemoradiation.  She completed chemoradiation for anal cancer, experiencing chest pain during treatment, which led to an early cessation of chemotherapy by one day. Her left buttock remains raw, while the right side is healing. Bowel movements are irregular, starting hard and then turning into diarrhea. She uses A+D ointment for skin care. She has lost six to seven pounds during treatment and plans to follow a high-calorie, high-protein diet to regain strength and weight. She intends to start pelvic floor physical therapy to address potential stool and urine incontinence issues.     All other systems were reviewed with the patient and are negative.  MEDICAL HISTORY:  Past Medical History:  Diagnosis Date   Anemia    Anginal pain (HCC)    Arthritis    Chest discomfort    Disorder of immune system (HCC)    Dyspnea    Dyspnea on exertion    Hypercholesterolemia    Hypertension    Migraine    Non-ulcer dyspepsia    Osteopenia of spine    Peptic ulcer disease    Pneumonia    Polyp of rectum    cancerous    Pre-diabetes    Skin cancer    Ulcerative colitis (HCC)     SURGICAL HISTORY: Past Surgical History:  Procedure Laterality Date   ANKLE FRACTURE SURGERY Left    CARDIAC CATHETERIZATION     COLONOSCOPY     ESOPHAGOGASTRODUODENOSCOPY     hip replacement Bilateral    LEFT HEART CATH AND CORONARY ANGIOGRAPHY N/A 01/21/2022   Procedure: LEFT HEART CATH AND CORONARY ANGIOGRAPHY;  Surgeon: Verlin Lonni JONETTA, MD;  Location: [facility];  Service: Cardiovascular;  Laterality: N/A;   LEFT HEART CATH AND CORONARY ANGIOGRAPHY N/A 02/03/2024   Procedure: LEFT HEART CATH AND CORONARY ANGIOGRAPHY;  Surgeon: Darron Deatrice LABOR, MD;  Location: [facility];  Service: Cardiovascular;  Laterality: N/A;   RECTAL BIOPSY N/A 05/04/2022   Procedure: BIOPSY RECTAL VS EXCISION OF PERIANAL LESION UNDER ANOSCOPY;  Surgeon: Teresa Lonni HERO, MD;  Location: [facility];  Service: General;  Laterality: N/A;   TOTAL KNEE ARTHROPLASTY Left 10/19/2022   Procedure: LEFT TOTAL KNEE ARTHROPLASTY;  Surgeon: Jerri Kay HERO, MD;  Location: [facility];  Service: Orthopedics;  Laterality: Left;   TUBAL LIGATION     TUMOR EXCISION N/A 01/14/2024   Procedure: TRANSRECTAL TUMOR EXCISION;  Surgeon: Teresa Lonni HERO, MD;  Location: [facility];  Service: General;  Laterality: N/A;  Transanal excision of distal rectal and anal lesion (wide local)    I have reviewed the social history and family history with the patient and  they are unchanged from previous note.  ALLERGIES:  is allergic to codeine.  MEDICATIONS:  Current Outpatient Medications  Medication Sig Dispense Refill   acetaminophen  (TYLENOL ) 500 MG tablet Take 1,000 mg by mouth every 6 (six) hours as needed for moderate pain.     albuterol  (VENTOLIN  HFA) 108 (90 Base) MCG/ACT inhaler Inhale 1-2 puffs into the lungs every 4 (four) hours as needed for wheezing or shortness of breath. 8 g 0   ammonium lactate (LAC-HYDRIN) 12 % lotion Apply 1  Application topically daily.     atorvastatin  (LIPITOR) 80 MG tablet TAKE ONE TABLET BY MOUTH DAILY 90 tablet 1   dicyclomine  (BENTYL ) 10 MG capsule Take 1 capsule (10 mg total) by mouth 3 times/day as needed-between meals & bedtime for spasms. 30 capsule 0   diltiazem  (CARDIZEM  CD) 180 MG 24 hr capsule Take 1 capsule (180 mg total) by mouth daily. 90 capsule 3   diphenoxylate -atropine  (LOMOTIL ) 2.5-0.025 MG tablet Take 1-2 tablets by mouth 4 (four) times daily as needed for diarrhea or loose stools. 60 tablet 0   ezetimibe  (ZETIA ) 10 MG tablet TAKE 1 TABLET BY MOUTH DAILY 90 tablet 3   famotidine  (PEPCID ) 20 MG tablet Take 1 tablet (20 mg total) by mouth 2 (two) times daily as needed for heartburn or indigestion. 60 tablet 1   fluticasone  (FLONASE ) 50 MCG/ACT nasal spray Place 2 sprays into both nostrils daily. 1 g 0   magic mouthwash (nystatin , lidocaine , diphenhydrAMINE , alum & mag hydroxide) suspension Swish and swallow 5 mLs by mouth 3 (three) times daily as needed for mouth pain. 140 mL 1   NIFEdipine  (ADALAT  CC) 60 MG 24 hr tablet Take 1 tablet (60 mg total) by mouth daily. 30 tablet 0   nitroGLYCERIN  (NITROSTAT ) 0.4 MG SL tablet Place 1 tablet (0.4 mg total) under the tongue every 5 (five) minutes as needed for chest pain. 20 tablet 12   ondansetron  (ZOFRAN ) 8 MG tablet Take 1 tablet (8 mg total) by mouth every 8 (eight) hours as needed for nausea or vomiting. 30 tablet 1   oxyCODONE  (OXY IR/ROXICODONE ) 5 MG immediate release tablet Take 0.5-1 tablets (2.5-5 mg total) by mouth every 4 (four) hours as needed for severe pain (pain score 7-10). 60 tablet 0   pantoprazole  (PROTONIX ) 40 MG tablet Take 1 tablet (40 mg total) by mouth 2 (two) times daily. 60 tablet 0   PARoxetine  (PAXIL ) 20 MG tablet Take 10 mg by mouth daily.     potassium chloride  SA (KLOR-CON  M) 20 MEQ tablet Take 1 tablet (20 mEq total) by mouth daily. 30 tablet 0   SUMAtriptan (IMITREX) 25 MG tablet Take 25 mg by mouth every 2  (two) hours as needed for migraine.     vedolizumab  (ENTYVIO ) 300 MG injection Inject 300 mg into the vein every 8 (eight) weeks.     silver sulfADIAZINE (SILVADENE) 1 % cream Apply 1 Application topically 3 (three) times daily as needed. Apply to buttocks area as needed 50 g 1   No current facility-administered medications for this visit.    PHYSICAL EXAMINATION: ECOG PERFORMANCE STATUS: 1 - Symptomatic but completely ambulatory  Vitals:   03/21/24 1007  BP: (!) 142/68  Pulse: (!) 108  Resp: 16  Temp: 98.6 F (37 C)  SpO2: 96%   Wt Readings from Last 3 Encounters:  03/21/24 167 lb 14.4 oz (76.2 kg)  03/16/24 168 lb 12.8 oz (76.6 kg)  03/14/24 167 lb 12.8 oz (76.1 kg)  GENERAL:alert, no distress and comfortable SKIN: skin color, texture, turgor are normal, no rashes or significant lesions EYES: normal, Conjunctiva are pink and non-injected, sclera clear Musculoskeletal:no cyanosis of digits and no clubbing  NEURO: alert & oriented x 3 with fluent speech, no focal motor/sensory deficits  Physical Exam    LABORATORY DATA:  I have reviewed the data as listed    Latest Ref Rng & Units 03/21/2024    9:36 AM 03/14/2024   10:06 AM 03/06/2024    2:04 PM  CBC  WBC 4.0 - 10.5 K/uL 3.7  2.3  4.2   Hemoglobin 12.0 - 15.0 g/dL 8.9  8.5  9.2   Hematocrit 36.0 - 46.0 % 25.4  24.4  26.8   Platelets 150 - 400 K/uL 148  125  126         Latest Ref Rng & Units 03/21/2024    9:36 AM 03/14/2024   10:06 AM 03/06/2024    2:04 PM  CMP  Glucose 70 - 99 mg/dL 879  873  879   BUN 8 - 23 mg/dL 10  12  21    Creatinine 0.44 - 1.00 mg/dL 9.21  8.96  9.13   Sodium 135 - 145 mmol/L 141  138  138   Potassium 3.5 - 5.1 mmol/L 3.5  3.5  3.4   Chloride 98 - 111 mmol/L 109  104  109   CO2 22 - 32 mmol/L 24  28  23    Calcium  8.9 - 10.3 mg/dL 8.9  8.7  8.8   Total Protein 6.5 - 8.1 g/dL 6.1  5.9  6.3   Total Bilirubin 0.0 - 1.2 mg/dL 0.4  0.4  0.4   Alkaline Phos 38 - 126 U/L 61  48  53   AST  15 - 41 U/L 19  13  23    ALT 0 - 44 U/L 13  10  30        RADIOGRAPHIC STUDIES: I have personally reviewed the radiological images as listed and agreed with the findings in the report. No results found.    Orders Placed This Encounter  Procedures   NM PET Image Restag (PS) Skull Base To Thigh    Standing Status:   Future    Expected Date:   05/15/2024    Expiration Date:   03/21/2025    If indicated for the ordered procedure, I authorize the administration of a radiopharmaceutical per Radiology protocol:   Yes    Preferred imaging location?:   [facility]   All questions were answered. The patient knows to call the clinic with any problems, questions or concerns. No barriers to learning was detected. The total time spent in the appointment was 30 minutes, including review of chart and various tests results, discussions about plan of care and coordination of care plan     Onita Mattock, MD 03/21/2024

## 2024-03-28 ENCOUNTER — Other Ambulatory Visit: Payer: Self-pay

## 2024-03-28 DIAGNOSIS — C210 Malignant neoplasm of anus, unspecified: Secondary | ICD-10-CM

## 2024-03-30 ENCOUNTER — Telehealth: Payer: Self-pay | Admitting: Hematology

## 2024-03-30 NOTE — Telephone Encounter (Signed)
 Scheduled appointments per 7/1 los. Called and left a VM with appointment details for the patient.

## 2024-03-31 ENCOUNTER — Other Ambulatory Visit: Payer: Self-pay

## 2024-04-07 ENCOUNTER — Telehealth: Payer: Self-pay | Admitting: Radiology

## 2024-04-07 NOTE — Telephone Encounter (Signed)
 I called the patient x3 to discuss how she has been doing since completing her radiation treatment. I was unable to get a hold of the patient, but left our office number to return my call if she has any concerns. She is scheduled for a post-treatment call with our team on 04/17/2024.     Leeroy Due, PA-C

## 2024-04-10 ENCOUNTER — Other Ambulatory Visit: Payer: Self-pay

## 2024-04-10 ENCOUNTER — Ambulatory Visit: Attending: Hematology | Admitting: Physical Therapy

## 2024-04-10 ENCOUNTER — Encounter: Payer: Self-pay | Admitting: Physical Therapy

## 2024-04-10 DIAGNOSIS — R293 Abnormal posture: Secondary | ICD-10-CM | POA: Diagnosis present

## 2024-04-10 DIAGNOSIS — M6281 Muscle weakness (generalized): Secondary | ICD-10-CM | POA: Diagnosis present

## 2024-04-10 NOTE — Therapy (Signed)
 OUTPATIENT PHYSICAL THERAPY FEMALE PELVIC TREATMENT   Patient Name: Andrea [name] MRN: [id number] DOB:May 16, 1956, 68 y.o., female Today's Date: 04/10/2024  END OF SESSION:  PT End of Session - 04/10/24 0854     Visit Number 2    Date for PT Re-Evaluation 10/11/24    Authorization Type Medicare    PT Start Time 0850    PT Stop Time 0930    PT Time Calculation (min) 40 min    Activity Tolerance Patient tolerated treatment well    Behavior During Therapy WFL for tasks assessed/performed          Past Medical History:  Diagnosis Date   Anemia    Anginal pain (HCC)    Arthritis    Chest discomfort    Disorder of immune system (HCC)    Dyspnea    Dyspnea on exertion    Hypercholesterolemia    Hypertension    Migraine    Non-ulcer dyspepsia    Osteopenia of spine    Peptic ulcer disease    Pneumonia    Polyp of rectum    cancerous   Pre-diabetes    Skin cancer    Ulcerative colitis (HCC)    Past Surgical History:  Procedure Laterality Date   ANKLE FRACTURE SURGERY Left    CARDIAC CATHETERIZATION     COLONOSCOPY     ESOPHAGOGASTRODUODENOSCOPY     hip replacement Bilateral    LEFT HEART CATH AND CORONARY ANGIOGRAPHY N/A 01/21/2022   Procedure: LEFT HEART CATH AND CORONARY ANGIOGRAPHY;  Surgeon: Verlin Lonni JONETTA, MD;  Location: [facility];  Service: Cardiovascular;  Laterality: N/A;   LEFT HEART CATH AND CORONARY ANGIOGRAPHY N/A 02/03/2024   Procedure: LEFT HEART CATH AND CORONARY ANGIOGRAPHY;  Surgeon: Darron Deatrice LABOR, MD;  Location: [facility];  Service: Cardiovascular;  Laterality: N/A;   RECTAL BIOPSY N/A 05/04/2022   Procedure: BIOPSY RECTAL VS EXCISION OF PERIANAL LESION UNDER ANOSCOPY;  Surgeon: Teresa Lonni HERO, MD;  Location: [facility];  Service: General;  Laterality: N/A;   TOTAL KNEE ARTHROPLASTY Left 10/19/2022   Procedure: LEFT TOTAL KNEE ARTHROPLASTY;  Surgeon: Jerri Kay HERO, MD;  Location: [facility];  Service: Orthopedics;   Laterality: Left;   TUBAL LIGATION     TUMOR EXCISION N/A 01/14/2024   Procedure: TRANSRECTAL TUMOR EXCISION;  Surgeon: Teresa Lonni HERO, MD;  Location: [facility];  Service: General;  Laterality: N/A;  Transanal excision of distal rectal and anal lesion (wide local)   Patient Active Problem List   Diagnosis Date Noted   Anal cancer (HCC) 02/28/2024   Coronary vasospasm (HCC) 02/28/2024   Myopericarditis 02/04/2024   Primary hypertension 02/04/2024   Non-ST elevation (NSTEMI) myocardial infarction (HCC) 02/03/2024   Chest pain 02/03/2024   Pleuritic chest pain 02/02/2024   Coronary artery disease due to lipid rich plaque 02/02/2024   Aortic atherosclerosis (HCC) 02/02/2024   Port-A-Cath in place 01/31/2024   Anal squamous cell carcinoma (HCC) 01/20/2024   AIN III (anal intraepithelial neoplasia III) 11/09/2023   Anal irritation 11/09/2023   History of anal cancer 11/09/2023   Status post total left knee replacement 10/19/2022   Primary osteoarthritis of left knee 09/30/2022   Chest pain of uncertain etiology    Ulcerative colitis (HCC) 07/06/2019   PUD (peptic ulcer disease) 07/06/2019    PCP: Marvene Prentice SAUNDERS, FNP  REFERRING PROVIDER:  Lanny Callander, MD  REFERRING DIAG: C21.0 (ICD-10-CM) - Anal cancer [facility])  THERAPY DIAG:  Muscle weakness (generalized)  Abnormal posture  Rationale for Evaluation and Treatment: Rehabilitation  ONSET DATE: 12/2023  SUBJECTIVE:                                                                                                                                                                                           SUBJECTIVE STATEMENT: Had to stop Chemo around 3/4 due to a heart reaction; did finish radiation July 3 .  Now all treatments are done.  Pt reports that she will have 30 treatments back to back- chemo and radiation   Fluid intake: water - 1 quart, coffee in morning 1 cups, tea daily 1x  PAIN:  PAIN:  Are you  having pain? Yes NPRS scale: 8-10/10 Pain location: rectum  Pain orientation: Other: rectal   PAIN TYPE: burning and sharp Pain description: constant  Aggravating factors: passing stool, goes away a few mins after emptying Relieving factors: just finishing bowel movement    PRECAUTIONS: None  RED FLAGS: None   WEIGHT BEARING RESTRICTIONS: No  FALLS:  Has patient fallen in last 6 months? No  OCCUPATION: retired  ACTIVITY LEVEL : not active in the last 3 weeks  PLOF: Independent  PATIENT GOALS: does not know  PERTINENT HISTORY:  ANKLE FRACTURE SURGERY Left     CARDIAC CATHETERIZATION      COLONOSCOPY      ESOPHAGOGASTRODUODENOSCOPY      hip replacement Bilateral     LEFT HEART CATH AND CORONARY ANGIOGRAPHY N/A 01/21/2022 Procedure: LEFT HEART CATH AND CORONARY ANGIOGRAPHY;  Surgeon: Verlin Lonni BIRCH, MD;  Location: [facility];  Service: Cardiovascular;  Laterality: N/A;   RECTAL BIOPSY N/A 05/04/2022 Procedure: BIOPSY RECTAL VS EXCISION OF PERIANAL LESION UNDER ANOSCOPY;  Surgeon: Teresa Lonni HERO, MD;  Location: [facility];  Service: General;  Laterality: N/A;   TOTAL KNEE ARTHROPLASTY Left 10/19/2022 Procedure: LEFT TOTAL KNEE ARTHROPLASTY;  Surgeon: Jerri Kay HERO, MD;  Location: [facility];  Service: Orthopedics;  Laterality: Left;   TUBAL LIGATION      TUMOR EXCISION        Sexual abuse: No  BOWEL MOVEMENT: Pain with bowel movement: Yes Type of bowel movement:Type (Bristol Stool Scale) 5-6, Frequency 2-3x daily, and Strain yes Fully empty rectum: No Leakage: Yes: very short warning and will loss full bowels if can't make it quickly enough  Pads: Yes: 3-4x daily, no longer wearing at night  Fiber supplement/laxative No  URINATION: Pain with urination: yes - at start of treatments was exteremly painful but now a little  Fully empty bladder: Yes:  Stream: Strong Urgency: Yes  Frequency: every 2  hours, usually 3-4x nightly  Leakage: none Pads:  No  INTERCOURSE: not active   PREGNANCY: Vaginal deliveries 3 Tearing Yes: big babies PROLAPSE: None   OBJECTIVE:  Note: Objective measures were completed at Evaluation unless otherwise noted.  DIAGNOSTIC FINDINGS:  Anal cancer  PATIENT SURVEYS:    PFIQ-7: 0 04/10/24: 179  COGNITION: Overall cognitive status: Within functional limits for tasks assessed     SENSATION: Light touch: Appears intact  LUMBAR SPECIAL TESTS:  Straight leg raise test: Negative  FUNCTIONAL TESTS:  5 times sit to stand: 13 seconds  GAIT: Assistive device utilized: None Comments: antalgic  POSTURE: rounded shoulders and forward head   LUMBARAROM/PROM: full   LOWER EXTREMITY ROM: full  LOWER EXTREMITY MMT: right hip 4-/5 flexion, left hip 4/5  PALPATION:   General: upper chest breathing, no tightness bilateral hip scars, low abdominal tone  Pelvic Alignment: even  Abdominal: abdominal doming supra umbilical, low abdominal tone                 External Perineal Exam: deferred to next visit                             Internal Pelvic Floor: deferred to next visit  Patient confirms identification and approves PT to assess internal pelvic floor and treatment No  PELVIC MMT:   MMT eval  Vaginal   Internal Anal Sphincter   External Anal Sphincter   Puborectalis   Diastasis Recti 1 finger at and under umbilicus, doming above umbilicus  (Blank rows = not tested)        TONE: Deferred to next appt  PROLAPSE: Deferred to next appt  TODAY'S TREATMENT:                                                                                                                              DATE:  01/27/2024  EVAL see below  04/10/24: Pt educated on voiding mechanics, step stool for bowel movement, breathing mechanics instead of straining, HEP updated and reviewed with pt, vaginal dilators also educated on with handout given.    PATIENT EDUCATION:  Education details: relevant anatomy, exam  findings, expectations of PT, chemo, radiation Person educated: Patient Education method: Explanation, Demonstration, Tactile cues, Verbal cues, and Handouts Education comprehension: verbalized understanding, returned demonstration, verbal cues required, tactile cues required, and needs further education  HOME EXERCISE PROGRAM: Access Code: [email or web address] URL: [email or web address] Date: 04/10/2024 Prepared by: Darryle Navy  Exercises - Walk for 30 mins total a day as able  - 1 x daily - 7 x weekly - Diaphragmatic Breathing in Child's Pose with Pelvic Floor Relaxation  - 1 x daily - 7 x weekly - 3 sets - 30s hold - Sidelying Thoracic Rotation with Open Book  - 1 x daily - 7 x weekly - 1 sets - 3 reps - 30s holds - Cat Cow  - 1 x  daily - 7 x weekly - 1 sets - 10 reps  Patient Education - Bowel Emptying Techniques - Abdominal Massage for Constipation  ASSESSMENT:  CLINICAL IMPRESSION: Patient is a 68 y.o. F who was seen today for physical therapy  treatment for anal cancer. Pt present for follow up with currently no longer having cancer treatments. Has completed radiation and needed to stop chemo about 3/4 of treatment due to reaction. Pt reports increased urgency, frequency of bowels with incontinence, pain with bowel and bladder voids, and continued weakness and balance deficits from evaluation. Pt educated on updated HEP and use of vaginal dilators. Plan to have internal next session for assessment of pelvic floor as pt is not 4 weeks post radiation treatment yet but will be then . Pt denied questions at this treatment session and motivated to continue. Pt would benefit from additional PT to further address deficits.    OBJECTIVE IMPAIRMENTS: Abnormal gait, decreased activity tolerance, decreased balance, decreased coordination, decreased endurance, difficulty walking, decreased strength, and impaired tone.   ACTIVITY LIMITATIONS: squatting and caring for others  PARTICIPATION  LIMITATIONS: community activity and yard work  PERSONAL FACTORS: 1 comorbidity: anal cancer are also affecting patient's functional outcome.   REHAB POTENTIAL: Good  CLINICAL DECISION MAKING: Stable/uncomplicated  EVALUATION COMPLEXITY: Low   GOALS: Goals reviewed with patient? Yes  SHORT TERM GOALS: Target date: 4 weeks after her treatment ends    Pt will be independent with HEP.   Baseline: Goal status: INITIAL   LONG TERM GOALS: Target date: 04/20/2024    Pt will be independent with advanced HEP.   Baseline:  Goal status: INITIAL  2.  Pt will be able to participate in at least 45 mins exercise session without increased fatigue Baseline:  Goal status: INITIAL  3.  Pt will dem at least 12 sec for 5xSTS to demonstrate good hip strength to help with ease of transfers in and out of the car , bed and chair Baseline:  Goal status: INITIAL  4.  Pt will dem good balance to reduce risk of fall Baseline:  Goal status: INITIAL  5.  Pt will have 0 pelvic pain, be able to contract/ relax and bulge her pelvic floor with good strength and coordination Baseline:  Goal status: INITIAL   PLAN:  PT FREQUENCY: 1-2x/week  PT DURATION: 12 weeks  PLANNED INTERVENTIONS: 97110-Therapeutic exercises, 97530- Therapeutic activity, 97112- Neuromuscular re-education, 97535- Self Care, [id number]- Manual therapy, 97032- Electrical stimulation (manual), Taping, Dry Needling, Joint mobilization, Joint manipulation, Spinal manipulation, Spinal mobilization, Manual lymph drainage, Compression bandaging, Cryotherapy, Moist heat, and Biofeedback  PLAN FOR NEXT SESSION: re- eval, internal pelvic floor muscle assessment, develop HEP, strengthening post tx   Darryle Navy, PT, DPT 04/10/2509:54 AM  [facility] [address] [place], [place] [address] Phone # [phone] Fax [phone]

## 2024-04-10 NOTE — Patient Instructions (Signed)
Dilators: 1. Find a comfortable position and work on diaphragmatic breathing: you can lie on your back propped up on pillows and knees supported as a good first position to try 2. Start with your finger and a little bit of lubricant: gentle massage the vaginal entrance before use of dilator 3. Wash dilators before and after you use them; then place a little bit of lubricant along the dilator on all sides 4. Gently press the very tip of the dilator into the vagina - stop with any pain or discomfort 5. First goal is to get dilator inside vagina: you can use the back and forth (side to side) movement to help gently press dilator forward. 6. Once the dilator is inside the vaginal canal, just lie there and take some deep breaths, read something, listen to music, etc - distract yourself! 7. Ways to progress: a. Bigger side to side b. Tilting c. Twisting d. Thrusting - does not have to be the length of the dilator at first - start with just a small movement 8. If you get stuck and there's pain: leave the dilator where it is and try to ignore it - distract yourself and work on breathing. If you cannot get a dilator comfortable, go down in size. The primary goal is to make this a positive experience and end on a good note.

## 2024-04-14 ENCOUNTER — Telehealth (HOSPITAL_COMMUNITY): Payer: Self-pay | Admitting: Vascular Surgery

## 2024-04-14 NOTE — Progress Notes (Signed)
  Radiation Oncology         [phone] ________________________________  Name: Andrea [name] MRN: [id number]  Date of Service: 04/17/2024  DOB: 12/27/1955  Post Treatment Telephone Note  Diagnosis:  Squamous cell carcinoma of the anus with a history of AIN III (as documented in provider EOT note)  The patient was available for call today.   Symptoms of fatigue have improved since completing therapy.  Symptoms of skin changes have improved since completing therapy.   Symptoms of bladder changes have improved since completing therapy.  Symptoms of bowel changes have improved since completing therapy but is still having uncomfortable 9/10.  The patient will continue to follow up with Dr. Teresa, Dr. Lanny, and Dr. Legrand for ongoing surveillance. She was encouraged to call if she develops concerns or questions regarding radiation.   This concludes the interaction.  Rosaline Minerva, LPN

## 2024-04-14 NOTE — Telephone Encounter (Signed)
 Lvm to make f/u w/ Zenaida in the next 2 weeks, asked pt to call back to make appt

## 2024-04-17 ENCOUNTER — Ambulatory Visit: Admitting: Physical Therapy

## 2024-04-17 ENCOUNTER — Ambulatory Visit
Admission: RE | Admit: 2024-04-17 | Discharge: 2024-04-17 | Disposition: A | Discharge status: Home / Self Care | Source: Ambulatory Visit | Attending: Hematology | Admitting: Hematology

## 2024-04-17 DIAGNOSIS — E876 Hypokalemia: Secondary | ICD-10-CM | POA: Insufficient documentation

## 2024-04-17 DIAGNOSIS — M6281 Muscle weakness (generalized): Secondary | ICD-10-CM | POA: Diagnosis not present

## 2024-04-17 DIAGNOSIS — C210 Malignant neoplasm of anus, unspecified: Secondary | ICD-10-CM | POA: Insufficient documentation

## 2024-04-17 DIAGNOSIS — D61818 Other pancytopenia: Secondary | ICD-10-CM | POA: Insufficient documentation

## 2024-04-17 DIAGNOSIS — R197 Diarrhea, unspecified: Secondary | ICD-10-CM | POA: Insufficient documentation

## 2024-04-17 DIAGNOSIS — R293 Abnormal posture: Secondary | ICD-10-CM

## 2024-04-17 DIAGNOSIS — K1379 Other lesions of oral mucosa: Secondary | ICD-10-CM | POA: Insufficient documentation

## 2024-04-17 DIAGNOSIS — I739 Peripheral vascular disease, unspecified: Secondary | ICD-10-CM | POA: Insufficient documentation

## 2024-04-17 DIAGNOSIS — K219 Gastro-esophageal reflux disease without esophagitis: Secondary | ICD-10-CM | POA: Insufficient documentation

## 2024-04-17 DIAGNOSIS — Z79899 Other long term (current) drug therapy: Secondary | ICD-10-CM | POA: Insufficient documentation

## 2024-04-17 DIAGNOSIS — R0789 Other chest pain: Secondary | ICD-10-CM | POA: Insufficient documentation

## 2024-04-17 DIAGNOSIS — Z510 Encounter for antineoplastic radiation therapy: Secondary | ICD-10-CM | POA: Insufficient documentation

## 2024-04-17 NOTE — Therapy (Signed)
 OUTPATIENT PHYSICAL THERAPY FEMALE PELVIC TREATMENT   Patient Name: Andrea [name] MRN: [id number] DOB:1956/06/05, 68 y.o., female Today's Date: 04/17/2024  END OF SESSION:  PT End of Session - 04/17/24 0853     Visit Number 3    Date for PT Re-Evaluation 10/11/24    Authorization Type Medicare    Progress Note Due on Visit 10    PT Start Time 0850    PT Stop Time 0928    PT Time Calculation (min) 38 min    Activity Tolerance Patient tolerated treatment well    Behavior During Therapy WFL for tasks assessed/performed          Past Medical History:  Diagnosis Date   Anemia    Anginal pain (HCC)    Arthritis    Chest discomfort    Disorder of immune system (HCC)    Dyspnea    Dyspnea on exertion    Hypercholesterolemia    Hypertension    Migraine    Non-ulcer dyspepsia    Osteopenia of spine    Peptic ulcer disease    Pneumonia    Polyp of rectum    cancerous   Pre-diabetes    Skin cancer    Ulcerative colitis (HCC)    Past Surgical History:  Procedure Laterality Date   ANKLE FRACTURE SURGERY Left    CARDIAC CATHETERIZATION     COLONOSCOPY     ESOPHAGOGASTRODUODENOSCOPY     hip replacement Bilateral    LEFT HEART CATH AND CORONARY ANGIOGRAPHY N/A 01/21/2022   Procedure: LEFT HEART CATH AND CORONARY ANGIOGRAPHY;  Surgeon: Verlin Lonni JONETTA, MD;  Location: [facility];  Service: Cardiovascular;  Laterality: N/A;   LEFT HEART CATH AND CORONARY ANGIOGRAPHY N/A 02/03/2024   Procedure: LEFT HEART CATH AND CORONARY ANGIOGRAPHY;  Surgeon: Darron Deatrice LABOR, MD;  Location: [facility];  Service: Cardiovascular;  Laterality: N/A;   RECTAL BIOPSY N/A 05/04/2022   Procedure: BIOPSY RECTAL VS EXCISION OF PERIANAL LESION UNDER ANOSCOPY;  Surgeon: Teresa Lonni HERO, MD;  Location: [facility];  Service: General;  Laterality: N/A;   TOTAL KNEE ARTHROPLASTY Left 10/19/2022   Procedure: LEFT TOTAL KNEE ARTHROPLASTY;  Surgeon: Jerri Kay HERO, MD;  Location: [facility];  Service: Orthopedics;  Laterality: Left;   TUBAL LIGATION     TUMOR EXCISION N/A 01/14/2024   Procedure: TRANSRECTAL TUMOR EXCISION;  Surgeon: Teresa Lonni HERO, MD;  Location: [facility];  Service: General;  Laterality: N/A;  Transanal excision of distal rectal and anal lesion (wide local)   Patient Active Problem List   Diagnosis Date Noted   Anal cancer (HCC) 02/28/2024   Coronary vasospasm (HCC) 02/28/2024   Myopericarditis 02/04/2024   Primary hypertension 02/04/2024   Non-ST elevation (NSTEMI) myocardial infarction (HCC) 02/03/2024   Chest pain 02/03/2024   Pleuritic chest pain 02/02/2024   Coronary artery disease due to lipid rich plaque 02/02/2024   Aortic atherosclerosis (HCC) 02/02/2024   Port-A-Cath in place 01/31/2024   Anal squamous cell carcinoma (HCC) 01/20/2024   AIN III (anal intraepithelial neoplasia III) 11/09/2023   Anal irritation 11/09/2023   History of anal cancer 11/09/2023   Status post total left knee replacement 10/19/2022   Primary osteoarthritis of left knee 09/30/2022   Chest pain of uncertain etiology    Ulcerative colitis (HCC) 07/06/2019   PUD (peptic ulcer disease) 07/06/2019    PCP: Marvene Prentice SAUNDERS, FNP  REFERRING PROVIDER:  Lanny Callander, MD  REFERRING DIAG: C21.0 (  ICD-10-CM) - Anal cancer (HCC)  THERAPY DIAG:  Muscle weakness (generalized)  Abnormal posture  Rationale for Evaluation and Treatment: Rehabilitation  ONSET DATE: 12/2023  SUBJECTIVE:                                                                                                                                                                                           SUBJECTIVE STATEMENT: Reports she had appointment with surgeon last week and they didn't do any internal stating internal would be too invasive right now per pt. Bowel movements still very painful   Fluid intake: water - 1 quart, coffee in morning 1 cups, tea daily 1x  PAIN:  PAIN:  Are  you having pain? Yes NPRS scale: 8-10/10 Pain location: rectum  Pain orientation: Other: rectal   PAIN TYPE: burning and sharp Pain description: constant  Aggravating factors: passing stool, goes away a few mins after emptying Relieving factors: just finishing bowel movement    PRECAUTIONS: None  RED FLAGS: None   WEIGHT BEARING RESTRICTIONS: No  FALLS:  Has patient fallen in last 6 months? No  OCCUPATION: retired  ACTIVITY LEVEL : not active in the last 3 weeks  PLOF: Independent  PATIENT GOALS: does not know  PERTINENT HISTORY:  ANKLE FRACTURE SURGERY Left     CARDIAC CATHETERIZATION      COLONOSCOPY      ESOPHAGOGASTRODUODENOSCOPY      hip replacement Bilateral     LEFT HEART CATH AND CORONARY ANGIOGRAPHY N/A 01/21/2022 Procedure: LEFT HEART CATH AND CORONARY ANGIOGRAPHY;  Surgeon: Verlin Lonni BIRCH, MD;  Location: [facility];  Service: Cardiovascular;  Laterality: N/A;   RECTAL BIOPSY N/A 05/04/2022 Procedure: BIOPSY RECTAL VS EXCISION OF PERIANAL LESION UNDER ANOSCOPY;  Surgeon: Teresa Lonni HERO, MD;  Location: [facility];  Service: General;  Laterality: N/A;   TOTAL KNEE ARTHROPLASTY Left 10/19/2022 Procedure: LEFT TOTAL KNEE ARTHROPLASTY;  Surgeon: Jerri Kay HERO, MD;  Location: [facility];  Service: Orthopedics;  Laterality: Left;   TUBAL LIGATION      TUMOR EXCISION        Sexual abuse: No  BOWEL MOVEMENT: Pain with bowel movement: Yes Type of bowel movement:Type (Bristol Stool Scale) 5-6, Frequency 2-3x daily, and Strain yes Fully empty rectum: No Leakage: Yes: very short warning and will loss full bowels if can't make it quickly enough  Pads: Yes: 3-4x daily, no longer wearing at night  Fiber supplement/laxative No  URINATION: Pain with urination: yes - at start of treatments was exteremly painful but now a little  Fully empty bladder: Yes:  Stream: Strong Urgency: Yes  Frequency: every 2  hours, usually 3-4x nightly  Leakage: none Pads:  No  INTERCOURSE: not active   PREGNANCY: Vaginal deliveries 3 Tearing Yes: big babies PROLAPSE: None   OBJECTIVE:  Note: Objective measures were completed at Evaluation unless otherwise noted.  DIAGNOSTIC FINDINGS:  Anal cancer  PATIENT SURVEYS:    PFIQ-7: 0 04/10/24: 179  COGNITION: Overall cognitive status: Within functional limits for tasks assessed     SENSATION: Light touch: Appears intact  LUMBAR SPECIAL TESTS:  Straight leg raise test: Negative  FUNCTIONAL TESTS:  5 times sit to stand: 13 seconds  GAIT: Assistive device utilized: None Comments: antalgic  POSTURE: rounded shoulders and forward head   LUMBARAROM/PROM: full   LOWER EXTREMITY ROM: full  LOWER EXTREMITY MMT: right hip 4-/5 flexion, left hip 4/5  PALPATION:   General: upper chest breathing, no tightness bilateral hip scars, low abdominal tone  Pelvic Alignment: even  Abdominal: abdominal doming supra umbilical, low abdominal tone                 External Perineal Exam: deferred to next visit                             Internal Pelvic Floor: deferred to next visit  Patient confirms identification and approves PT to assess internal pelvic floor and treatment No  PELVIC MMT:   MMT eval  Vaginal   Internal Anal Sphincter   External Anal Sphincter   Puborectalis   Diastasis Recti 1 finger at and under umbilicus, doming above umbilicus  (Blank rows = not tested)        TONE: Deferred to next appt  PROLAPSE: Deferred to next appt  TODAY'S TREATMENT:                                                                                                                              DATE:  01/27/2024  EVAL see below  04/10/24: Pt educated on voiding mechanics, step stool for bowel movement, breathing mechanics instead of straining, HEP updated and reviewed with pt, vaginal dilators also educated on with handout given.   04/17/24: Windshield wipers x10 Single knee to chest  3x30s Butterfly 3x30s Single leg butterfly 2x30s each Open books x10 each Thoracic extension with foam roller along spine length - 1 min static with pelvic floor drops and diaphragmatic breathing, x10 wide claps, x10 angle wings     PATIENT EDUCATION:  Education details: relevant anatomy, exam findings, expectations of PT, chemo, radiation Person educated: Patient Education method: Explanation, Demonstration, Tactile cues, Verbal cues, and Handouts Education comprehension: verbalized understanding, returned demonstration, verbal cues required, tactile cues required, and needs further education  HOME EXERCISE PROGRAM: Access Code: [email or web address] URL: [email or web address] Date: 04/17/2024 Prepared by: Darryle  Exercises - Walk for 30 mins total a day as able  - 1 x daily - 7 x weekly - Sidelying Thoracic Rotation with Open Book  - 1  x daily - 7 x weekly - 1 sets - 10 reps - Supine Hip Internal and External Rotation  - 1 x daily - 7 x weekly - 1 sets - 10 reps - Single Knee to Chest Stretch  - 1 x daily - 7 x weekly - 1 sets - 3 reps - 30s holds - Supported Teacher, music with Pelvic Floor Relaxation  - 1 x daily - 7 x weekly - 1 sets - 3 reps - 30s holds - Unilateral Supported Supine Butterfly Stretch  - 1 x daily - 7 x weekly - 1 sets - 3 reps - 30s holds  Patient Education - Bowel Emptying Techniques - Abdominal Massage for Constipation  ASSESSMENT:  CLINICAL IMPRESSION: Patient is a 68 y.o. F who was seen today for physical therapy  treatment for anal cancer. Pt present for follow up with currently no longer having cancer treatments. Has completed radiation and needed to stop chemo about 3/4 of treatment due to reaction. Pt reports increased urgency, frequency of bowels with incontinence, pain with bowel and bladder voids, and continued weakness and balance deficits from evaluation. Pt reports she has been doing walking program daily but hasn't been doing HEP much due  to difficulty on her knees, HEP updated today to remove these exercises. All HEP exercises completed during session today and pt demonstrated good technique with minimal cues and denied pain but does endorse tightness felt. Pt cued throughout for relaxation, decreased gripping in abdomen and glutes, and for dropping shoulders. Pt would benefit from additional PT to further address deficits.    OBJECTIVE IMPAIRMENTS: Abnormal gait, decreased activity tolerance, decreased balance, decreased coordination, decreased endurance, difficulty walking, decreased strength, and impaired tone.   ACTIVITY LIMITATIONS: squatting and caring for others  PARTICIPATION LIMITATIONS: community activity and yard work  PERSONAL FACTORS: 1 comorbidity: anal cancer are also affecting patient's functional outcome.   REHAB POTENTIAL: Good  CLINICAL DECISION MAKING: Stable/uncomplicated  EVALUATION COMPLEXITY: Low   GOALS: Goals reviewed with patient? Yes  SHORT TERM GOALS: Target date: 4 weeks after her treatment ends    Pt will be independent with HEP.   Baseline: Goal status: INITIAL   LONG TERM GOALS: Target date: 04/20/2024    Pt will be independent with advanced HEP.   Baseline:  Goal status: INITIAL  2.  Pt will be able to participate in at least 45 mins exercise session without increased fatigue Baseline:  Goal status: INITIAL  3.  Pt will dem at least 12 sec for 5xSTS to demonstrate good hip strength to help with ease of transfers in and out of the car , bed and chair Baseline:  Goal status: INITIAL  4.  Pt will dem good balance to reduce risk of fall Baseline:  Goal status: INITIAL  5.  Pt will have 0 pelvic pain, be able to contract/ relax and bulge her pelvic floor with good strength and coordination Baseline:  Goal status: INITIAL   PLAN:  PT FREQUENCY: 1-2x/week  PT DURATION: 12 weeks  PLANNED INTERVENTIONS: 97110-Therapeutic exercises, 97530- Therapeutic activity, 97112-  Neuromuscular re-education, 97535- Self Care, [id number]- Manual therapy, 97032- Electrical stimulation (manual), Taping, Dry Needling, Joint mobilization, Joint manipulation, Spinal manipulation, Spinal mobilization, Manual lymph drainage, Compression bandaging, Cryotherapy, Moist heat, and Biofeedback  PLAN FOR NEXT SESSION: re- eval, internal pelvic floor muscle assessment, develop HEP, strengthening post tx   Darryle Navy, PT, DPT 07/28/259:29 AM  [facility] [address], [address] [place], [place] [address] Phone #  [phone] Fax [phone]

## 2024-04-18 ENCOUNTER — Other Ambulatory Visit: Payer: Self-pay | Admitting: Cardiology

## 2024-04-18 ENCOUNTER — Inpatient Hospital Stay

## 2024-04-18 DIAGNOSIS — C210 Malignant neoplasm of anus, unspecified: Secondary | ICD-10-CM | POA: Diagnosis not present

## 2024-04-18 LAB — CMP (CANCER CENTER ONLY)
ALT: 11 U/L (ref 0–44)
AST: 20 U/L (ref 15–41)
Albumin: 3.6 g/dL (ref 3.5–5.0)
Alkaline Phosphatase: 71 U/L (ref 38–126)
Anion gap: 5 (ref 5–15)
BUN: 13 mg/dL (ref 8–23)
CO2: 26 mmol/L (ref 22–32)
Calcium: 9.2 mg/dL (ref 8.9–10.3)
Chloride: 110 mmol/L (ref 98–111)
Creatinine: 0.86 mg/dL (ref 0.44–1.00)
GFR, Estimated: >60 mL/min (ref >60)
Glucose, Bld: 103 mg/dL — ABNORMAL HIGH (ref 70–99)
Potassium: 4.3 mmol/L (ref 3.5–5.1)
Sodium: 141 mmol/L (ref 135–145)
Total Bilirubin: 0.6 mg/dL (ref 0.0–1.2)
Total Protein: 6.7 g/dL (ref 6.5–8.1)

## 2024-04-18 LAB — CBC WITH DIFFERENTIAL (CANCER CENTER ONLY)
Abs Immature Granulocytes: 0.02 K/uL (ref 0.00–0.07)
Basophils Absolute: 0 K/uL (ref 0.0–0.1)
Basophils Relative: 1 %
Eosinophils Absolute: 0.1 K/uL (ref 0.0–0.5)
Eosinophils Relative: 2 %
HCT: 29 % — ABNORMAL LOW (ref 36.0–46.0)
Hemoglobin: 9.7 g/dL — ABNORMAL LOW (ref 12.0–15.0)
Immature Granulocytes: 1 %
Lymphocytes Relative: 25 %
Lymphs Abs: 1 K/uL (ref 0.7–4.0)
MCH: 30.2 pg (ref 26.0–34.0)
MCHC: 33.4 g/dL (ref 30.0–36.0)
MCV: 90.3 fL (ref 80.0–100.0)
Monocytes Absolute: 0.6 K/uL (ref 0.1–1.0)
Monocytes Relative: 15 %
Neutro Abs: 2.3 K/uL (ref 1.7–7.7)
Neutrophils Relative %: 56 %
Platelet Count: 203 K/uL (ref 150–400)
RBC: 3.21 MIL/uL — ABNORMAL LOW (ref 3.87–5.11)
RDW: 17.6 % — ABNORMAL HIGH (ref 11.5–15.5)
WBC Count: 4 K/uL (ref 4.0–10.5)
nRBC: 0 % (ref 0.0–0.2)

## 2024-04-18 LAB — CEA (ACCESS): CEA (CHCC): 3.65 ng/mL (ref 0.00–5.00)

## 2024-04-20 ENCOUNTER — Encounter: Payer: Self-pay | Admitting: Hematology

## 2024-04-24 ENCOUNTER — Encounter: Payer: Self-pay | Admitting: Hematology

## 2024-04-24 ENCOUNTER — Other Ambulatory Visit (HOSPITAL_COMMUNITY): Payer: Self-pay

## 2024-04-24 ENCOUNTER — Telehealth: Payer: Self-pay | Admitting: Radiation Oncology

## 2024-04-24 ENCOUNTER — Encounter: Payer: Self-pay | Admitting: Physical Therapy

## 2024-04-24 ENCOUNTER — Ambulatory Visit: Attending: Hematology | Admitting: Physical Therapy

## 2024-04-24 ENCOUNTER — Encounter: Payer: Self-pay | Admitting: Gastroenterology

## 2024-04-24 DIAGNOSIS — R293 Abnormal posture: Secondary | ICD-10-CM | POA: Diagnosis present

## 2024-04-24 DIAGNOSIS — M6281 Muscle weakness (generalized): Secondary | ICD-10-CM | POA: Insufficient documentation

## 2024-04-24 DIAGNOSIS — M62838 Other muscle spasm: Secondary | ICD-10-CM | POA: Diagnosis present

## 2024-04-24 MED ORDER — HYDROCORTISONE ACETATE 25 MG RE SUPP
25.0000 mg | Freq: Two times a day (BID) | RECTAL | 2 refills | Status: DC
  Filled 2024-04-24: qty 12, 6d supply, fill #0
  Filled 2024-05-05: qty 12, 6d supply, fill #1

## 2024-04-24 NOTE — Therapy (Signed)
 OUTPATIENT PHYSICAL THERAPY FEMALE PELVIC TREATMENT   Patient Name: Andrea [name] MRN: [id number] DOB:04/24/1956, 68 y.o., female Today's Date: 04/24/2024  END OF SESSION:  PT End of Session - 04/24/24 1022     Visit Number 4    Date for PT Re-Evaluation 10/11/24    Authorization Type Medicare    Progress Note Due on Visit 10    PT Start Time 1013    PT Stop Time 1055    PT Time Calculation (min) 42 min    Activity Tolerance Patient tolerated treatment well    Behavior During Therapy WFL for tasks assessed/performed           Past Medical History:  Diagnosis Date   Anemia    Anginal pain (HCC)    Arthritis    Chest discomfort    Disorder of immune system (HCC)    Dyspnea    Dyspnea on exertion    Hypercholesterolemia    Hypertension    Migraine    Non-ulcer dyspepsia    Osteopenia of spine    Peptic ulcer disease    Pneumonia    Polyp of rectum    cancerous   Pre-diabetes    Skin cancer    Ulcerative colitis (HCC)    Past Surgical History:  Procedure Laterality Date   ANKLE FRACTURE SURGERY Left    CARDIAC CATHETERIZATION     COLONOSCOPY     ESOPHAGOGASTRODUODENOSCOPY     hip replacement Bilateral    LEFT HEART CATH AND CORONARY ANGIOGRAPHY N/A 01/21/2022   Procedure: LEFT HEART CATH AND CORONARY ANGIOGRAPHY;  Surgeon: Verlin Lonni JONETTA, MD;  Location: [facility];  Service: Cardiovascular;  Laterality: N/A;   LEFT HEART CATH AND CORONARY ANGIOGRAPHY N/A 02/03/2024   Procedure: LEFT HEART CATH AND CORONARY ANGIOGRAPHY;  Surgeon: Darron Deatrice LABOR, MD;  Location: [facility];  Service: Cardiovascular;  Laterality: N/A;   RECTAL BIOPSY N/A 05/04/2022   Procedure: BIOPSY RECTAL VS EXCISION OF PERIANAL LESION UNDER ANOSCOPY;  Surgeon: Teresa Lonni HERO, MD;  Location: [facility];  Service: General;  Laterality: N/A;   TOTAL KNEE ARTHROPLASTY Left 10/19/2022   Procedure: LEFT TOTAL KNEE ARTHROPLASTY;  Surgeon: Jerri Kay HERO, MD;  Location: [facility];  Service: Orthopedics;  Laterality: Left;   TUBAL LIGATION     TUMOR EXCISION N/A 01/14/2024   Procedure: TRANSRECTAL TUMOR EXCISION;  Surgeon: Teresa Lonni HERO, MD;  Location: [facility];  Service: General;  Laterality: N/A;  Transanal excision of distal rectal and anal lesion (wide local)   Patient Active Problem List   Diagnosis Date Noted   Anal cancer (HCC) 02/28/2024   Coronary vasospasm (HCC) 02/28/2024   Myopericarditis 02/04/2024   Primary hypertension 02/04/2024   Non-ST elevation (NSTEMI) myocardial infarction (HCC) 02/03/2024   Chest pain 02/03/2024   Pleuritic chest pain 02/02/2024   Coronary artery disease due to lipid rich plaque 02/02/2024   Aortic atherosclerosis (HCC) 02/02/2024   Port-A-Cath in place 01/31/2024   Anal squamous cell carcinoma (HCC) 01/20/2024   AIN III (anal intraepithelial neoplasia III) 11/09/2023   Anal irritation 11/09/2023   History of anal cancer 11/09/2023   Status post total left knee replacement 10/19/2022   Primary osteoarthritis of left knee 09/30/2022   Chest pain of uncertain etiology    Ulcerative colitis (HCC) 07/06/2019   PUD (peptic ulcer disease) 07/06/2019    PCP: Marvene Prentice SAUNDERS, FNP  REFERRING PROVIDER:  Lanny Callander, MD  REFERRING DIAG:  C21.0 (ICD-10-CM) - Anal cancer (HCC)  THERAPY DIAG:  Muscle weakness (generalized)  Abnormal posture  Rationale for Evaluation and Treatment: Rehabilitation  ONSET DATE: 12/2023  SUBJECTIVE:                                                                                                                                                                                           SUBJECTIVE STATEMENT: Patient reports that she will try some steroids suppositories. Feels good overall Reports that she has really horrific pain with BMs, she breaks in sweat, feels like it's tearing. Activity wise- she walks her dog every day, she is much more active now.  Has bristol  stool scale 4, tiny stools     Fluid intake: water - 1 quart, coffee in morning 1 cups, tea daily 1x  PAIN:  PAIN:  Are you having pain? Yes NPRS scale: 8-10/10 Pain location: rectum  Pain orientation: Other: rectal   PAIN TYPE: burning and sharp Pain description: constant  Aggravating factors: passing stool, goes away a few mins after emptying Relieving factors: just finishing bowel movement    PRECAUTIONS: None  RED FLAGS: None   WEIGHT BEARING RESTRICTIONS: No  FALLS:  Has patient fallen in last 6 months? No  OCCUPATION: retired  ACTIVITY LEVEL : not active in the last 3 weeks  PLOF: Independent  PATIENT GOALS: does not know  PERTINENT HISTORY:  ANKLE FRACTURE SURGERY Left     CARDIAC CATHETERIZATION      COLONOSCOPY      ESOPHAGOGASTRODUODENOSCOPY      hip replacement Bilateral     LEFT HEART CATH AND CORONARY ANGIOGRAPHY N/A 01/21/2022 Procedure: LEFT HEART CATH AND CORONARY ANGIOGRAPHY;  Surgeon: Verlin Lonni BIRCH, MD;  [facility]: [facility];  Service: Cardiovascular;  Laterality: N/A;   RECTAL BIOPSY N/A 05/04/2022 Procedure: BIOPSY RECTAL VS EXCISION OF PERIANAL LESION UNDER ANOSCOPY;  Surgeon: Teresa Lonni HERO, MD;  Location: [facility];  Service: General;  Laterality: N/A;   TOTAL KNEE ARTHROPLASTY Left 10/19/2022 Procedure: LEFT TOTAL KNEE ARTHROPLASTY;  Surgeon: Jerri Kay HERO, MD;  Location: [facility];  Service: Orthopedics;  Laterality: Left;   TUBAL LIGATION      TUMOR EXCISION        Sexual abuse: No  BOWEL MOVEMENT: Pain with bowel movement: Yes Type of bowel movement:Type (Bristol Stool Scale) 5-6, Frequency 2-3x daily, and Strain yes Fully empty rectum: No Leakage: Yes: very short warning and will loss full bowels if can't make it quickly enough  Pads: Yes: 3-4x daily, no longer wearing at night  Fiber supplement/laxative No  URINATION: Pain with urination: yes -  at start of treatments was exteremly painful but now a little   Fully empty bladder: Yes:  Stream: Strong Urgency: Yes  Frequency: every 2 hours, usually 3-4x nightly  Leakage: none Pads: No  INTERCOURSE: not active   PREGNANCY: Vaginal deliveries 3 Tearing Yes: big babies PROLAPSE: None   OBJECTIVE:  Note: Objective measures were completed at Evaluation unless otherwise noted.  DIAGNOSTIC FINDINGS:  Anal cancer  PATIENT SURVEYS:    PFIQ-7: 0 04/10/24: 179  COGNITION: Overall cognitive status: Within functional limits for tasks assessed     SENSATION: Light touch: Appears intact  LUMBAR SPECIAL TESTS:  Straight leg raise test: Negative  FUNCTIONAL TESTS:  5 times sit to stand: 13 seconds  GAIT: Assistive device utilized: None Comments: antalgic  POSTURE: rounded shoulders and forward head   LUMBARAROM/PROM: full   LOWER EXTREMITY ROM: full  LOWER EXTREMITY MMT: right hip 4-/5 flexion, left hip 4/5  PALPATION:   General: upper chest breathing, no tightness bilateral hip scars, low abdominal tone  Pelvic Alignment: even  Abdominal: abdominal doming supra umbilical, low abdominal tone                 External Perineal Exam: mild dryness noticed                             Internal Pelvic Floor: tight and tender throughout  Patient confirms identification and approves PT to assess internal pelvic floor and treatment yes Patient confirms identification and approves PT to assess internal pelvic floor and treatment Yes No emotional/communication barriers or cognitive limitation. Patient is motivated to learn. Patient understands and agrees with treatment goals and plan. PT explains patient will be examined in standing, sitting, and lying down to see how their muscles and joints work. When they are ready, they will be asked to remove their underwear so PT can examine their perineum. The patient is also given the option of providing their own chaperone as one is not provided in our facility. The patient also has the  right and is explained the right to defer or refuse any part of the evaluation or treatment including the internal exam. With the patient's consent, PT will use one gloved finger to gently assess the muscles of the pelvic floor, seeing how well it contracts and relaxes and if there is muscle symmetry. After, the patient will get dressed and PT and patient will discuss exam findings and plan of care. PT and patient discuss plan of care, schedule, attendance policy and HEP activities. - 04/24/2024     PELVIC MMT:   MMT eval  Vaginal 3/5  Internal Anal Sphincter   External Anal Sphincter   Puborectalis   Diastasis Recti 1 finger at and under umbilicus, doming above umbilicus  (Blank rows = not tested)        TONE: high  PROLAPSE: None noticed in hooklying  TODAY'S TREATMENT:  DATE:  04/25/2024 Internal pelvic floor muscle assessment There acts- Education on dilators Neurom reed- open books Review of HEP Diaphragmatic breathing with pelvic floor drop Single knee to chest 3x10 sec Hip rotations 15 reps QL stretch bilateral 10 reps     EVAL see below  04/10/24: Pt educated on voiding mechanics, step stool for bowel movement, breathing mechanics instead of straining, HEP updated and reviewed with pt, vaginal dilators also educated on with handout given.   04/17/24: Windshield wipers x10 Single knee to chest 3x30s Butterfly 3x30s Single leg butterfly 2x30s each Open books x10 each Thoracic extension with foam roller along spine length - 1 min static with pelvic floor drops and diaphragmatic breathing, x10 wide claps, x10 angle wings     PATIENT EDUCATION:  Education details: relevant anatomy, exam findings, expectations of PT, chemo, radiation Person educated: Patient Education method: Explanation, Demonstration, Tactile cues, Verbal cues, and  Handouts Education comprehension: verbalized understanding, returned demonstration, verbal cues required, tactile cues required, and needs further education  HOME EXERCISE PROGRAM: Access Code: [email or web address] URL: [email or web address] Date: 04/17/2024 Prepared by: Darryle  Exercises - Walk for 30 mins total a day as able  - 1 x daily - 7 x weekly - Sidelying Thoracic Rotation with Open Book  - 1 x daily - 7 x weekly - 1 sets - 10 reps - Supine Hip Internal and External Rotation  - 1 x daily - 7 x weekly - 1 sets - 10 reps - Single Knee to Chest Stretch  - 1 x daily - 7 x weekly - 1 sets - 3 reps - 30s holds - Supported Teacher, music with Pelvic Floor Relaxation  - 1 x daily - 7 x weekly - 1 sets - 3 reps - 30s holds - Unilateral Supported Supine Butterfly Stretch  - 1 x daily - 7 x weekly - 1 sets - 3 reps - 30s holds  Patient Education - Bowel Emptying Techniques - Abdominal Massage for Constipation  ASSESSMENT:  CLINICAL IMPRESSION: Patient was seen today for treatment of anal cancer rehab. Patient with high tone vaginally and tenderness and weakness throughout. Patient did well  with exercises, manual therapy and education today. We discussed progress, consistency with dilator training and recommended continued pelvic floor down training. Treatment session focused on pelvic floor muscle assessment and pelvic floor down training. Patient had no difficulty with her stretches. Patient is progressing well towards goals and will benefit from continued PT to address deficits, reduce pain with bowel movements and bladder voids and improve quality of life.       Last session Patient is a 68 y.o. F who was seen today for physical therapy  treatment for anal cancer. Pt present for follow up with currently no longer having cancer treatments. Has completed radiation and needed to stop chemo about 3/4 of treatment due to reaction. Pt reports increased urgency, frequency of bowels with  incontinence, pain with bowel and bladder voids, and continued weakness and balance deficits from evaluation. Pt reports she has been doing walking program daily but hasn't been doing HEP much due to difficulty on her knees, HEP updated today to remove these exercises. All HEP exercises completed during session today and pt demonstrated good technique with minimal cues and denied pain but does endorse tightness felt. Pt cued throughout for relaxation, decreased gripping in abdomen and glutes, and for dropping shoulders. Pt would benefit from additional PT to further address deficits.    OBJECTIVE IMPAIRMENTS: Abnormal gait, decreased activity tolerance,  decreased balance, decreased coordination, decreased endurance, difficulty walking, decreased strength, and impaired tone.   ACTIVITY LIMITATIONS: squatting and caring for others  PARTICIPATION LIMITATIONS: community activity and yard work  PERSONAL FACTORS: 1 comorbidity: anal cancer are also affecting patient's functional outcome.   REHAB POTENTIAL: Good  CLINICAL DECISION MAKING: Stable/uncomplicated  EVALUATION COMPLEXITY: Low   GOALS: Goals reviewed with patient? Yes  SHORT TERM GOALS: Target date: 4 weeks after her treatment ends    Pt will be independent with HEP.   Baseline: Goal status: INITIAL   LONG TERM GOALS: Target date: 04/20/2024    Pt will be independent with advanced HEP.   Baseline:  Goal status: INITIAL  2.  Pt will be able to participate in at least 45 mins exercise session without increased fatigue Baseline:  Goal status: INITIAL  3.  Pt will dem at least 12 sec for 5xSTS to demonstrate good hip strength to help with ease of transfers in and out of the car , bed and chair Baseline:  Goal status: INITIAL  4.  Pt will dem good balance to reduce risk of fall Baseline:  Goal status: INITIAL  5.  Pt will have 0 pelvic pain, be able to contract/ relax and bulge her pelvic floor with good strength and  coordination Baseline:  Goal status: INITIAL   PLAN:  PT FREQUENCY: 1-2x/week  PT DURATION: 12 weeks  PLANNED INTERVENTIONS: 97110-Therapeutic exercises, 97530- Therapeutic activity, 97112- Neuromuscular re-education, 97535- Self Care, 02859- Manual therapy, 97032- Electrical stimulation (manual), Taping, Dry Needling, Joint mobilization, Joint manipulation, Spinal manipulation, Spinal mobilization, Manual lymph drainage, Compression bandaging, Cryotherapy, Moist heat, and Biofeedback  PLAN FOR NEXT SESSION: re- eval, internal pelvic floor muscle assessment, develop HEP, strengthening post tx   Ysabel Cowgill, PT 04/24/24 10:22 AM  [facility] [address], [address] [place], [place] [address] Phone # [phone] Fax [phone]

## 2024-04-24 NOTE — Telephone Encounter (Signed)
 I called pt back and moved her prescription for anusol  to [facility] outpt pharmacy since the price at [facility] was significantly higher. She is in agreement to fill and try the medication like we discussed earlier

## 2024-04-24 NOTE — Telephone Encounter (Signed)
 I spoke with the patient this morning and it sounds like she is still having quite a bit of inflammatory changes in the anal sphincter and feels sharp burning and sometimes tearing sensation as she passes stool.  She has taken cathartics to try and help move her bowels more easily which did change the consistency of her stool but still cause the same symptoms.  She follows up with Dr. Legrand later this month and has her Entyvio  infusion for ulcerative colitis, and sees Dr. Teresa later this month as well to really consider an examination as she was too irritated the last time she saw him.  We discussed considering a course of Anusol  suppositories to see if we can improve some of the inflammation that I suspect could be contributing to her symptoms.  I will also copy Dr. Legrand and Dr. Teresa so they are aware of this and if Dr. Legrand has any additional recommendations knowing her history with UC.  I offered to see her later this week if she is still having symptoms and we can reevaluate that and I will check in with her Wednesday.

## 2024-04-25 NOTE — Telephone Encounter (Signed)
 Donald,    Since this is from the radiation treatment she is receiving for the anal cancer, my opinion is that topical therapy with recticare (OTC lidocaine ) ointment would be worth trying rather than a suppository.  Suppository may be painful to use in this scenario.  Also, if there is any visible irritation/inflammation of the peri-anal skin, a barrier cream such as Desitin (zinc oxide) may also be helpful.  - H. Legrand, MD - [facility]

## 2024-04-25 NOTE — Telephone Encounter (Signed)
 LM for pt sharing recommendations from Dr. Legrand and Dr. Teresa as well.

## 2024-04-27 ENCOUNTER — Telehealth (HOSPITAL_COMMUNITY): Payer: Self-pay | Admitting: Cardiology

## 2024-04-27 ENCOUNTER — Encounter: Payer: Self-pay | Admitting: Radiation Oncology

## 2024-04-27 NOTE — Telephone Encounter (Signed)
 Called to confirm/remind patient of their appointment at the [facility] on 04/27/2024.   Appointment:   [x] Confirmed  [] Left mess   [] No answer/No voice mail  [] VM Full/unable to leave message  [] Phone not in service  Patient reminded to bring all medications and/or complete list.  Confirmed patient has transportation. Gave directions, instructed to utilize valet parking.

## 2024-04-28 ENCOUNTER — Encounter (HOSPITAL_COMMUNITY): Payer: Self-pay | Admitting: Cardiology

## 2024-04-28 ENCOUNTER — Ambulatory Visit (HOSPITAL_COMMUNITY)
Admission: RE | Admit: 2024-04-28 | Discharge: 2024-04-28 | Disposition: A | Discharge status: Home / Self Care | Source: Ambulatory Visit | Attending: Cardiology | Admitting: Cardiology

## 2024-04-28 VITALS — BP 170/80 | HR 79 | Wt 164.8 lb

## 2024-04-28 DIAGNOSIS — C210 Malignant neoplasm of anus, unspecified: Secondary | ICD-10-CM | POA: Insufficient documentation

## 2024-04-28 DIAGNOSIS — Z923 Personal history of irradiation: Secondary | ICD-10-CM | POA: Diagnosis not present

## 2024-04-28 DIAGNOSIS — T451X5A Adverse effect of antineoplastic and immunosuppressive drugs, initial encounter: Secondary | ICD-10-CM | POA: Diagnosis not present

## 2024-04-28 DIAGNOSIS — I201 Angina pectoris with documented spasm: Secondary | ICD-10-CM | POA: Diagnosis present

## 2024-04-28 DIAGNOSIS — G620 Drug-induced polyneuropathy: Secondary | ICD-10-CM | POA: Diagnosis not present

## 2024-04-28 DIAGNOSIS — Z79631 Long term (current) use of antimetabolite agent: Secondary | ICD-10-CM | POA: Insufficient documentation

## 2024-04-28 DIAGNOSIS — I10 Essential (primary) hypertension: Secondary | ICD-10-CM | POA: Insufficient documentation

## 2024-04-28 DIAGNOSIS — Z79899 Other long term (current) drug therapy: Secondary | ICD-10-CM | POA: Diagnosis not present

## 2024-04-28 MED ORDER — AMLODIPINE BESYLATE 5 MG PO TABS: 5.0000 mg | ORAL_TABLET | Freq: Every day | ORAL | 3 refills | Status: AC

## 2024-04-28 NOTE — Progress Notes (Signed)
   [facility] FOLLOW UP CLINIC NOTE  Referring Physician: Marvene Prentice SAUNDERS, FNP  Primary Care: Marvene Prentice SAUNDERS, FNP Primary Cardiologist:  HPI: Andrea [name] is a 68 y.o. female who presents for follow up of coronary vasospasm.       Oncology History  Anal squamous cell carcinoma (HCC)  12/29/2023 Pathology Results   A. PERIANAL, RIGHT POSTERIOR, BIOPSY:       Invasive squamous cell carcinoma, moderately differentiated. Suspicious for lymphovascular invasion.  B. PERIANAL, LEFT POSTERIOR, BIOPSY:       High-grade squamous intraepithelial lesion (HSIL / AIN 3).  No evidence of definitive invasion.    01/20/2024 Initial Diagnosis   Anal cancer (HCC)   01/26/2024 Cancer Staging   Staging form: Anus, AJCC 8th Edition - Clinical: Stage IIIA (cT2, cN1c, cM0) - Signed by Lanny Callander, MD on 01/26/2024   01/31/2024 -  Chemotherapy   Patient is on Treatment Plan : ANUS Mitomycin  D1,28 + 5FU D1-4, 28-31 q32d      Patient with admission for chest pain while on 5-FU infusion. Workup showed elevated troponin, EKG changes, but by the time cardiac catheterization was completed her EKG changes had resolved and was feeling better, normal coronaries.   Was started on intensive premedication and underwent continuous 5-FU infusion while hospitalized.  She did require IV nitroglycerin  drip and developed chest pain that eventually limited to total dose of therapy, but did receive about 80% of the infusion.     SUBJECTIVE:  Overall doing better, she is off her previously ordered blood pressure therapy as well as prophylaxis for coronary vasospasm.  She does report that her blood pressure has been more elevated at most of her recent appointments.  Finished up radiation therapy, planning to meet back with oncology soon to discuss further treatment plan.  Having some issues with neuropathy from the chemotherapy.  PMH, current medications, allergies, social history, and family history reviewed in  epic.  PHYSICAL EXAM: Vitals:   04/28/24 0936  BP: (!) 170/80  Pulse: 79  SpO2: 97%   GENERAL: Well nourished and in no apparent distress at rest.  PULM:  Normal work of breathing, clear to auscultation bilaterally. Respirations are unlabored.  CARDIAC:  JVP: Flat         Normal rate with regular rhythm. No murmurs, rubs or gallops.  Trace edema. Warm and well perfused extremities. ABDOMEN: Soft, non-tender, non-distended. NEUROLOGIC: Patient is oriented x3 with no focal or lateralizing neurologic deficits.    DATA REVIEW    ECHO: 02/03/2024: LVEF 55-60, normal RV function  CATH: 02/03/2024: Mild LAD and RCA disease, otherwise normal coronary arteries  ASSESSMENT & PLAN:  Coronary vasospasm: In the setting of 5FU infusion, did not tolerate full therapy, but was extremely high risk on continuous infusion and received the majority of therapy. Occasional twinges with exertion since that time, but no chest pressure. Will restart BP control. - Start amlodipine  5mg  daily - Likely ARB/ACEi at PCP appointment - Consider PET scan in the future if persistent  Hypertension: Elevated today. - Restart BP meds as above  Anal cancer: cT2N1M0, follows with Dr. Lanny.  - PET scan pending - Surveillance phase currently  Follow up in 3-6 months  Morene Brownie, MD Advanced [facility] Mechanical Circulatory Support 04/30/24

## 2024-04-28 NOTE — Patient Instructions (Signed)
 Medication Changes:  START AMLODIPINE  5MG  ONCE DAILY   Follow-Up in: 6 MONTHS WITH DR. ZENAIDA PLEASE CALL OUR OFFICE AROUND DECEMBER TO GET SCHEDULED FOR YOUR APPOINTMENT. PHONE NUMBER IS [phone] OPTION 2 R  At the [facility], you and your health needs are our priority. We have a designated team specialized in the treatment of Heart Failure. This Care Team includes your primary Heart Failure Specialized Cardiologist (physician), Advanced Practice Providers (APPs- Physician Assistants and Nurse Practitioners), and Pharmacist who all work together to provide you with the care you need, when you need it.   You may see any of the following providers on your designated Care Team at your next follow up:  Dr. Toribio Fuel Dr. Ezra Shuck Dr. Ria Commander Dr. Odis ZENAIDA Greig Mosses, NP Caffie Shed, [place] [facility] [place], [place] Beckey Coe, NP [place] Lee, NP Tinnie Redman, PharmD   Please be sure to bring in all your medications bottles to every appointment.   Need to Contact Us :  If you have any questions or concerns before your next appointment please send us  a message through [facility] or call our office at [phone].    TO LEAVE A MESSAGE FOR THE NURSE SELECT OPTION 2, PLEASE LEAVE A MESSAGE INCLUDING: YOUR NAME DATE OF BIRTH CALL BACK NUMBER REASON FOR CALL**this is important as we prioritize the call backs  YOU WILL RECEIVE A CALL BACK THE SAME DAY AS LONG AS YOU CALL BEFORE 4:00 PM

## 2024-05-01 ENCOUNTER — Ambulatory Visit: Admitting: Physical Therapy

## 2024-05-01 ENCOUNTER — Encounter: Payer: Self-pay | Admitting: Physical Therapy

## 2024-05-01 DIAGNOSIS — M6281 Muscle weakness (generalized): Secondary | ICD-10-CM | POA: Diagnosis not present

## 2024-05-01 DIAGNOSIS — R293 Abnormal posture: Secondary | ICD-10-CM

## 2024-05-01 NOTE — Addendum Note (Signed)
 Addended by: Audrey Thull on: 05/01/2024 01:24 PM   Modules accepted: Orders

## 2024-05-01 NOTE — Therapy (Signed)
 OUTPATIENT PHYSICAL THERAPY FEMALE PELVIC TREATMENT- re certification   Patient Name: Andrea [name] MRN: [id number] DOB:Jun 27, 1956, 68 y.o., female Today's Date: 05/01/2024  END OF SESSION:  PT End of Session - 05/01/24 0851     Visit Number 5    Date for PT Re-Evaluation 08/01/24    Authorization Type Medicare    Progress Note Due on Visit 10    PT Start Time 0849    PT Stop Time 0929    PT Time Calculation (min) 40 min    Activity Tolerance Patient tolerated treatment well    Behavior During Therapy WFL for tasks assessed/performed           Past Medical History:  Diagnosis Date   Anemia    Anginal pain (HCC)    Arthritis    Chest discomfort    Disorder of immune system (HCC)    Dyspnea    Dyspnea on exertion    Hypercholesterolemia    Hypertension    Migraine    Non-ulcer dyspepsia    Osteopenia of spine    Peptic ulcer disease    Pneumonia    Polyp of rectum    cancerous   Pre-diabetes    Skin cancer    Ulcerative colitis (HCC)    Past Surgical History:  Procedure Laterality Date   ANKLE FRACTURE SURGERY Left    CARDIAC CATHETERIZATION     COLONOSCOPY     ESOPHAGOGASTRODUODENOSCOPY     hip replacement Bilateral    LEFT HEART CATH AND CORONARY ANGIOGRAPHY N/A 01/21/2022   Procedure: LEFT HEART CATH AND CORONARY ANGIOGRAPHY;  Surgeon: Verlin Lonni JONETTA, MD;  Location: [facility];  Service: Cardiovascular;  Laterality: N/A;   LEFT HEART CATH AND CORONARY ANGIOGRAPHY N/A 02/03/2024   Procedure: LEFT HEART CATH AND CORONARY ANGIOGRAPHY;  Surgeon: Darron Deatrice LABOR, MD;  Location: [facility];  Service: Cardiovascular;  Laterality: N/A;   RECTAL BIOPSY N/A 05/04/2022   Procedure: BIOPSY RECTAL VS EXCISION OF PERIANAL LESION UNDER ANOSCOPY;  Surgeon: Teresa Lonni HERO, MD;  Location: [facility];  Service: General;  Laterality: N/A;   TOTAL KNEE ARTHROPLASTY Left 10/19/2022   Procedure: LEFT TOTAL KNEE ARTHROPLASTY;  Surgeon: Jerri Kay HERO,  MD;  Location: [facility];  Service: Orthopedics;  Laterality: Left;   TUBAL LIGATION     TUMOR EXCISION N/A 01/14/2024   Procedure: TRANSRECTAL TUMOR EXCISION;  Surgeon: Teresa Lonni HERO, MD;  Location: [facility];  Service: General;  Laterality: N/A;  Transanal excision of distal rectal and anal lesion (wide local)   Patient Active Problem List   Diagnosis Date Noted   Anal cancer (HCC) 02/28/2024   Coronary vasospasm (HCC) 02/28/2024   Myopericarditis 02/04/2024   Primary hypertension 02/04/2024   Non-ST elevation (NSTEMI) myocardial infarction (HCC) 02/03/2024   Chest pain 02/03/2024   Pleuritic chest pain 02/02/2024   Coronary artery disease due to lipid rich plaque 02/02/2024   Aortic atherosclerosis (HCC) 02/02/2024   Port-A-Cath in place 01/31/2024   Anal squamous cell carcinoma (HCC) 01/20/2024   AIN III (anal intraepithelial neoplasia III) 11/09/2023   Anal irritation 11/09/2023   History of anal cancer 11/09/2023   Status post total left knee replacement 10/19/2022   Primary osteoarthritis of left knee 09/30/2022   Chest pain of uncertain etiology    Ulcerative colitis (HCC) 07/06/2019   PUD (peptic ulcer disease) 07/06/2019    PCP: Marvene Prentice SAUNDERS, FNP  REFERRING PROVIDER:  Lanny Callander, MD  REFERRING DIAG: C21.0 (ICD-10-CM) - Anal cancer (HCC)  THERAPY DIAG:  Muscle weakness (generalized)  Abnormal posture  Rationale for Evaluation and Treatment: Rehabilitation  ONSET DATE: 12/2023  SUBJECTIVE:                                                                                                                                                                                           SUBJECTIVE STATEMENT: Patient reports that she will try some steroids suppositories. Feels good overall Reports that she has really horrific pain with BMs, she breaks in sweat, feels like it's tearing. Activity wise- she walks her dog every day, she is much more active  now.  Has bristol stool scale 4, tiny stools     Fluid intake: water - 1 quart, coffee in morning 1 cups, tea daily 1x  PAIN:  PAIN:  Are you having pain? Yes NPRS scale: 8-10/10 Pain location: rectum  Pain orientation: Other: rectal   PAIN TYPE: burning and sharp Pain description: constant  Aggravating factors: passing stool, goes away a few mins after emptying Relieving factors: just finishing bowel movement    PRECAUTIONS: None  RED FLAGS: None   WEIGHT BEARING RESTRICTIONS: No  FALLS:  Has patient fallen in last 6 months? No  OCCUPATION: retired  ACTIVITY LEVEL : not active in the last 3 weeks  PLOF: Independent  PATIENT GOALS: does not know  PERTINENT HISTORY:  ANKLE FRACTURE SURGERY Left     CARDIAC CATHETERIZATION      COLONOSCOPY      ESOPHAGOGASTRODUODENOSCOPY      hip replacement Bilateral     LEFT HEART CATH AND CORONARY ANGIOGRAPHY N/A 01/21/2022 Procedure: LEFT HEART CATH AND CORONARY ANGIOGRAPHY;  Surgeon: Verlin Lonni BIRCH, MD;  Location: [facility];  Service: Cardiovascular;  Laterality: N/A;   RECTAL BIOPSY N/A 05/04/2022 Procedure: BIOPSY RECTAL VS EXCISION OF PERIANAL LESION UNDER ANOSCOPY;  Surgeon: Teresa Lonni HERO, MD;  Location: [facility];  Service: General;  Laterality: N/A;   TOTAL KNEE ARTHROPLASTY Left 10/19/2022 Procedure: LEFT TOTAL KNEE ARTHROPLASTY;  Surgeon: Jerri Kay HERO, MD;  Location: [facility];  Service: Orthopedics;  Laterality: Left;   TUBAL LIGATION      TUMOR EXCISION        Sexual abuse: No  BOWEL MOVEMENT: Pain with bowel movement: Yes Type of bowel movement:Type (Bristol Stool Scale) 5-6, Frequency 2-3x daily, and Strain yes Fully empty rectum: No Leakage: Yes: very short warning and will loss full bowels if can't make it quickly enough  Pads: Yes: 3-4x daily, no longer wearing at night  Fiber supplement/laxative No  URINATION: Pain with urination:  yes - at start of treatments was exteremly painful but  now a little  Fully empty bladder: Yes:  Stream: Strong Urgency: Yes  Frequency: every 2 hours, usually 3-4x nightly  Leakage: none Pads: No  INTERCOURSE: not active   PREGNANCY: Vaginal deliveries 3 Tearing Yes: big babies PROLAPSE: None   OBJECTIVE:  Note: Objective measures were completed at Evaluation unless otherwise noted.  DIAGNOSTIC FINDINGS:  Anal cancer  PATIENT SURVEYS:    PFIQ-7: 0 04/10/24: 179  COGNITION: Overall cognitive status: Within functional limits for tasks assessed     SENSATION: Light touch: Appears intact  LUMBAR SPECIAL TESTS:  Straight leg raise test: Negative  FUNCTIONAL TESTS:  5 times sit to stand: 13 seconds  GAIT: Assistive device utilized: None Comments: antalgic  POSTURE: rounded shoulders and forward head   LUMBARAROM/PROM: full   LOWER EXTREMITY ROM: full  LOWER EXTREMITY MMT: right hip 4-/5 flexion, left hip 4/5  PALPATION:   General: upper chest breathing, no tightness bilateral hip scars, low abdominal tone  Pelvic Alignment: even  Abdominal: abdominal doming supra umbilical, low abdominal tone                 External Perineal Exam: mild dryness noticed                             Internal Pelvic Floor: tight and tender throughout  Patient confirms identification and approves PT to assess internal pelvic floor and treatment yes Patient confirms identification and approves PT to assess internal pelvic floor and treatment Yes No emotional/communication barriers or cognitive limitation. Patient is motivated to learn. Patient understands and agrees with treatment goals and plan. PT explains patient will be examined in standing, sitting, and lying down to see how their muscles and joints work. When they are ready, they will be asked to remove their underwear so PT can examine their perineum. The patient is also given the option of providing their own chaperone as one is not provided in our facility. The patient also  has the right and is explained the right to defer or refuse any part of the evaluation or treatment including the internal exam. With the patient's consent, PT will use one gloved finger to gently assess the muscles of the pelvic floor, seeing how well it contracts and relaxes and if there is muscle symmetry. After, the patient will get dressed and PT and patient will discuss exam findings and plan of care. PT and patient discuss plan of care, schedule, attendance policy and HEP activities. - 04/24/2024     PELVIC MMT:   MMT eval  Vaginal 3/5  Internal Anal Sphincter   External Anal Sphincter   Puborectalis   Diastasis Recti 1 finger at and under umbilicus, doming above umbilicus  (Blank rows = not tested)        TONE: high  PROLAPSE: None noticed in hooklying  TODAY'S TREATMENT:  DATE:  04/25/2024 Internal pelvic floor muscle assessment There acts- Education on dilators Neurom reed- open books Review of HEP Diaphragmatic breathing with pelvic floor drop Single knee to chest 3x10 sec Hip rotations 15 reps QL stretch bilateral 10 reps     EVAL see below  04/10/24: Pt educated on voiding mechanics, step stool for bowel movement, breathing mechanics instead of straining, HEP updated and reviewed with pt, vaginal dilators also educated on with handout given.   04/17/24: Windshield wipers x10 Single knee to chest 3x30s Butterfly 3x30s Single leg butterfly 2x30s each Open books x10 each Thoracic extension with foam roller along spine length - 1 min static with pelvic floor drops and diaphragmatic breathing, x10 wide claps, x10 angle wings     PATIENT EDUCATION:  Education details: relevant anatomy, exam findings, expectations of PT, chemo, radiation Person educated: Patient Education method: Explanation, Demonstration, Tactile cues, Verbal cues, and  Handouts Education comprehension: verbalized understanding, returned demonstration, verbal cues required, tactile cues required, and needs further education  HOME EXERCISE PROGRAM: Access Code: [email or web address] URL: [email or web address] Date: 04/17/2024 Prepared by: Darryle  Exercises - Walk for 30 mins total a day as able  - 1 x daily - 7 x weekly - Sidelying Thoracic Rotation with Open Book  - 1 x daily - 7 x weekly - 1 sets - 10 reps - Supine Hip Internal and External Rotation  - 1 x daily - 7 x weekly - 1 sets - 10 reps - Single Knee to Chest Stretch  - 1 x daily - 7 x weekly - 1 sets - 3 reps - 30s holds - Supported Teacher, music with Pelvic Floor Relaxation  - 1 x daily - 7 x weekly - 1 sets - 3 reps - 30s holds - Unilateral Supported Supine Butterfly Stretch  - 1 x daily - 7 x weekly - 1 sets - 3 reps - 30s holds  Patient Education - Bowel Emptying Techniques - Abdominal Massage for Constipation  ASSESSMENT:  CLINICAL IMPRESSION: Patient was seen today for treatment of anal cancer rehab. Patient with high tone vaginally and tenderness and weakness throughout. Patient did well  with exercises, manual therapy and education today. We discussed progress, consistency with dilator training and recommended continued pelvic floor down training. Treatment session focused on pelvic floor muscle assessment and pelvic floor down training. Patient had no difficulty with her stretches. Patient is progressing well towards goals and will benefit from continued PT to address deficits, reduce pain with bowel movements and bladder voids and improve quality of life.       Last session Patient is a 68 y.o. F who was seen today for physical therapy  treatment for anal cancer. Pt present for follow up with currently no longer having cancer treatments. Has completed radiation and needed to stop chemo about 3/4 of treatment due to reaction. Pt reports increased urgency, frequency of bowels with  incontinence, pain with bowel and bladder voids, and continued weakness and balance deficits from evaluation. Pt reports she has been doing walking program daily but hasn't been doing HEP much due to difficulty on her knees, HEP updated today to remove these exercises. All HEP exercises completed during session today and pt demonstrated good technique with minimal cues and denied pain but does endorse tightness felt. Pt cued throughout for relaxation, decreased gripping in abdomen and glutes, and for dropping shoulders. Pt would benefit from additional PT to further address deficits.    OBJECTIVE IMPAIRMENTS: Abnormal gait, decreased activity tolerance,  decreased balance, decreased coordination, decreased endurance, difficulty walking, decreased strength, and impaired tone.   ACTIVITY LIMITATIONS: squatting and caring for others  PARTICIPATION LIMITATIONS: community activity and yard work  PERSONAL FACTORS: 1 comorbidity: anal cancer are also affecting patient's functional outcome.   REHAB POTENTIAL: Good  CLINICAL DECISION MAKING: Stable/uncomplicated  EVALUATION COMPLEXITY: Low   GOALS: Goals reviewed with patient? Yes  SHORT TERM GOALS: Target date: 4 weeks after her treatment ends    Pt will be independent with HEP.   Baseline: Goal status: INITIAL   LONG TERM GOALS: Target date: 04/20/2024 updated 08/01/2024    Pt will be independent with advanced HEP.   Baseline:  Goal status: progressing  2.  Pt will be able to participate in at least 45 mins exercise session without increased fatigue Baseline:  Goal status: INITIAL  3.  Pt will dem at least 12 sec for 5xSTS to demonstrate good hip strength to help with ease of transfers in and out of the car , bed and chair Baseline:  Goal status: INITIAL  4.  Pt will dem good balance to reduce risk of fall Baseline:  Goal status: progressing  5.  Pt will have 0 pelvic pain, be able to contract/ relax and bulge her pelvic floor  with good strength and coordination Baseline:  Goal status: INITIAL   PLAN:  PT FREQUENCY: 1-2x/week  PT DURATION: 12 weeks  PLANNED INTERVENTIONS: 97110-Therapeutic exercises, 97530- Therapeutic activity, 97112- Neuromuscular re-education, 97535- Self Care, [id number]- Manual therapy, 97032- Electrical stimulation (manual), Taping, Dry Needling, Joint mobilization, Joint manipulation, Spinal manipulation, Spinal mobilization, Manual lymph drainage, Compression bandaging, Cryotherapy, Moist heat, and Biofeedback  PLAN FOR NEXT SESSION: re- eval, internal pelvic floor muscle assessment, develop HEP, strengthening post tx   Jari Dipasquale, PT 05/01/24 12:39 PM  [facility] [address] [place], [place] [address] Phone # [phone] Fax [phone]

## 2024-05-01 NOTE — Therapy (Signed)
 OUTPATIENT PHYSICAL THERAPY FEMALE PELVIC TREATMENT   Patient Name: Andrea [name] MRN: [id number] DOB:12/30/1955, 68 y.o., female Today's Date: 05/01/2024  END OF SESSION:  PT End of Session - 05/01/24 0851     Visit Number 5    Date for PT Re-Evaluation 10/11/24    Authorization Type Medicare    Progress Note Due on Visit 10    PT Start Time 0849    PT Stop Time 0929    PT Time Calculation (min) 40 min    Activity Tolerance Patient tolerated treatment well    Behavior During Therapy WFL for tasks assessed/performed           Past Medical History:  Diagnosis Date   Anemia    Anginal pain (HCC)    Arthritis    Chest discomfort    Disorder of immune system (HCC)    Dyspnea    Dyspnea on exertion    Hypercholesterolemia    Hypertension    Migraine    Non-ulcer dyspepsia    Osteopenia of spine    Peptic ulcer disease    Pneumonia    Polyp of rectum    cancerous   Pre-diabetes    Skin cancer    Ulcerative colitis (HCC)    Past Surgical History:  Procedure Laterality Date   ANKLE FRACTURE SURGERY Left    CARDIAC CATHETERIZATION     COLONOSCOPY     ESOPHAGOGASTRODUODENOSCOPY     hip replacement Bilateral    LEFT HEART CATH AND CORONARY ANGIOGRAPHY N/A 01/21/2022   Procedure: LEFT HEART CATH AND CORONARY ANGIOGRAPHY;  Surgeon: Verlin Lonni JONETTA, MD;  Location: [facility];  Service: Cardiovascular;  Laterality: N/A;   LEFT HEART CATH AND CORONARY ANGIOGRAPHY N/A 02/03/2024   Procedure: LEFT HEART CATH AND CORONARY ANGIOGRAPHY;  Surgeon: Darron Deatrice LABOR, MD;  Location: [facility];  Service: Cardiovascular;  Laterality: N/A;   RECTAL BIOPSY N/A 05/04/2022   Procedure: BIOPSY RECTAL VS EXCISION OF PERIANAL LESION UNDER ANOSCOPY;  Surgeon: Teresa Lonni HERO, MD;  Location: [facility];  Service: General;  Laterality: N/A;   TOTAL KNEE ARTHROPLASTY Left 10/19/2022   Procedure: LEFT TOTAL KNEE ARTHROPLASTY;  Surgeon: Jerri Kay HERO, MD;  Location: [facility];  Service: Orthopedics;  Laterality: Left;   TUBAL LIGATION     TUMOR EXCISION N/A 01/14/2024   Procedure: TRANSRECTAL TUMOR EXCISION;  Surgeon: Teresa Lonni HERO, MD;  Location: [facility];  Service: General;  Laterality: N/A;  Transanal excision of distal rectal and anal lesion (wide local)   Patient Active Problem List   Diagnosis Date Noted   Anal cancer (HCC) 02/28/2024   Coronary vasospasm (HCC) 02/28/2024   Myopericarditis 02/04/2024   Primary hypertension 02/04/2024   Non-ST elevation (NSTEMI) myocardial infarction (HCC) 02/03/2024   Chest pain 02/03/2024   Pleuritic chest pain 02/02/2024   Coronary artery disease due to lipid rich plaque 02/02/2024   Aortic atherosclerosis (HCC) 02/02/2024   Port-A-Cath in place 01/31/2024   Anal squamous cell carcinoma (HCC) 01/20/2024   AIN III (anal intraepithelial neoplasia III) 11/09/2023   Anal irritation 11/09/2023   History of anal cancer 11/09/2023   Status post total left knee replacement 10/19/2022   Primary osteoarthritis of left knee 09/30/2022   Chest pain of uncertain etiology    Ulcerative colitis (HCC) 07/06/2019   PUD (peptic ulcer disease) 07/06/2019    PCP: Marvene Prentice SAUNDERS, FNP  REFERRING PROVIDER:  Lanny Callander, MD  REFERRING DIAG:  C21.0 (ICD-10-CM) - Anal cancer (HCC)  THERAPY DIAG:  Muscle weakness (generalized)  Abnormal posture  Rationale for Evaluation and Treatment: Rehabilitation  ONSET DATE: 12/2023  SUBJECTIVE:                                                                                                                                                                                           SUBJECTIVE STATEMENT: Patient reports that she is feeling ok, she is having  a lot of problems with her bowel movements. She has hard time using the bathroom. Excruciating pain. Using suppositories helps some.  She had 2 bowel accidents when she was walking Bowel movent wise- when it is is  coming, there is no stopping it. Very embarrassing.     Fluid intake: water - 1 quart, coffee in morning 1 cups, tea daily 1x  PAIN:  PAIN:  Are you having pain? Yes NPRS scale: 8-10/10 Pain location: rectum  Pain orientation: Other: rectal   PAIN TYPE: burning and sharp Pain description: constant  Aggravating factors: passing stool, goes away a few mins after emptying Relieving factors: just finishing bowel movement    PRECAUTIONS: None  RED FLAGS: None   WEIGHT BEARING RESTRICTIONS: No  FALLS:  Has patient fallen in last 6 months? No  OCCUPATION: retired  ACTIVITY LEVEL : not active in the last 3 weeks  PLOF: Independent  PATIENT GOALS: does not know  PERTINENT HISTORY:  ANKLE FRACTURE SURGERY Left     CARDIAC CATHETERIZATION      COLONOSCOPY      ESOPHAGOGASTRODUODENOSCOPY      hip replacement Bilateral     LEFT HEART CATH AND CORONARY ANGIOGRAPHY N/A 01/21/2022 Procedure: LEFT HEART CATH AND CORONARY ANGIOGRAPHY;  Surgeon: Verlin Lonni BIRCH, MD;  Location: [facility];  Service: Cardiovascular;  Laterality: N/A;   RECTAL BIOPSY N/A 05/04/2022 Procedure: BIOPSY RECTAL VS EXCISION OF PERIANAL LESION UNDER ANOSCOPY;  Surgeon: Teresa Lonni HERO, MD;  Location: [facility];  Service: General;  Laterality: N/A;   TOTAL KNEE ARTHROPLASTY Left 10/19/2022 Procedure: LEFT TOTAL KNEE ARTHROPLASTY;  Surgeon: Jerri Kay HERO, MD;  Location: [facility];  Service: Orthopedics;  Laterality: Left;   TUBAL LIGATION      TUMOR EXCISION        Sexual abuse: No  BOWEL MOVEMENT: Pain with bowel movement: Yes Type of bowel movement:Type (Bristol Stool Scale) 5-6, Frequency 2-3x daily, and Strain yes Fully empty rectum: No Leakage: Yes: very short warning and will loss full bowels if can't make it quickly enough  Pads: Yes: 3-4x daily, no longer wearing at night  Fiber supplement/laxative No  URINATION: Pain with urination: yes - at start of treatments was exteremly  painful but now a little  Fully empty bladder: Yes:  Stream: Strong Urgency: Yes  Frequency: every 2 hours, usually 3-4x nightly  Leakage: none Pads: No  INTERCOURSE: not active   PREGNANCY: Vaginal deliveries 3 Tearing Yes: big babies PROLAPSE: None   OBJECTIVE:  Note: Objective measures were completed at Evaluation unless otherwise noted.  DIAGNOSTIC FINDINGS:  Anal cancer  PATIENT SURVEYS:    PFIQ-7: 0 04/10/24: 179  COGNITION: Overall cognitive status: Within functional limits for tasks assessed     SENSATION: Light touch: Appears intact  LUMBAR SPECIAL TESTS:  Straight leg raise test: Negative  FUNCTIONAL TESTS:  5 times sit to stand: 13 seconds  GAIT: Assistive device utilized: None Comments: antalgic  POSTURE: rounded shoulders and forward head   LUMBARAROM/PROM: full   LOWER EXTREMITY ROM: full  LOWER EXTREMITY MMT: right hip 4-/5 flexion, left hip 4/5  PALPATION:   General: upper chest breathing, no tightness bilateral hip scars, low abdominal tone  Pelvic Alignment: even  Abdominal: abdominal doming supra umbilical, low abdominal tone                 External Perineal Exam: mild dryness noticed                             Internal Pelvic Floor: tight and tender throughout  Patient confirms identification and approves PT to assess internal pelvic floor and treatment yes Patient confirms identification and approves PT to assess internal pelvic floor and treatment Yes No emotional/communication barriers or cognitive limitation. Patient is motivated to learn. Patient understands and agrees with treatment goals and plan. PT explains patient will be examined in standing, sitting, and lying down to see how their muscles and joints work. When they are ready, they will be asked to remove their underwear so PT can examine their perineum. The patient is also given the option of providing their own chaperone as one is not provided in our facility. The  patient also has the right and is explained the right to defer or refuse any part of the evaluation or treatment including the internal exam. With the patient's consent, PT will use one gloved finger to gently assess the muscles of the pelvic floor, seeing how well it contracts and relaxes and if there is muscle symmetry. After, the patient will get dressed and PT and patient will discuss exam findings and plan of care. PT and patient discuss plan of care, schedule, attendance policy and HEP activities. - 04/24/2024     PELVIC MMT:   MMT eval  Vaginal 3/5  Internal Anal Sphincter   External Anal Sphincter   Puborectalis   Diastasis Recti 1 finger at and under umbilicus, doming above umbilicus  (Blank rows = not tested)        TONE: high  PROLAPSE: None noticed in hooklying  TODAY'S TREATMENT:  DATE:  05/01/2024 Pelvic floor lift with transverse abdominis breath with horizontal abduction with thera band and hip adduction with ball 2x10 STS 2x10 with pelvic floor contraction  Diaphragmatic breathing 10 breaths for relaxation Manual - hamstring stretches bilateral Balance training - 0tago based   04/25/2024 Internal pelvic floor muscle assessment There acts- Education on dilators Neurom reed- open books Review of HEP Diaphragmatic breathing with pelvic floor drop Single knee to chest 3x10 sec Hip rotations 15 reps QL stretch bilateral 10 reps     EVAL see below  04/10/24: Pt educated on voiding mechanics, step stool for bowel movement, breathing mechanics instead of straining, HEP updated and reviewed with pt, vaginal dilators also educated on with handout given.   04/17/24: Windshield wipers x10 Single knee to chest 3x30s Butterfly 3x30s Single leg butterfly 2x30s each Open books x10 each Thoracic extension with foam roller along spine length - 1 min  static with pelvic floor drops and diaphragmatic breathing, x10 wide claps, x10 angle wings     PATIENT EDUCATION:  Education details: relevant anatomy, exam findings, expectations of PT, chemo, radiation Person educated: Patient Education method: Explanation, Demonstration, Tactile cues, Verbal cues, and Handouts Education comprehension: verbalized understanding, returned demonstration, verbal cues required, tactile cues required, and needs further education  HOME EXERCISE PROGRAM: Access Code: [email or web address] URL: [email or web address] Date: 04/17/2024 Prepared by: Darryle  Exercises - Walk for 30 mins total a day as able  - 1 x daily - 7 x weekly - Sidelying Thoracic Rotation with Open Book  - 1 x daily - 7 x weekly - 1 sets - 10 reps - Supine Hip Internal and External Rotation  - 1 x daily - 7 x weekly - 1 sets - 10 reps - Single Knee to Chest Stretch  - 1 x daily - 7 x weekly - 1 sets - 3 reps - 30s holds - Supported Teacher, music with Pelvic Floor Relaxation  - 1 x daily - 7 x weekly - 1 sets - 3 reps - 30s holds - Unilateral Supported Supine Butterfly Stretch  - 1 x daily - 7 x weekly - 1 sets - 3 reps - 30s holds  Patient Education - Bowel Emptying Techniques - Abdominal Massage for Constipation  ASSESSMENT:  CLINICAL IMPRESSION: Patient was seen today for treatment of anal cancer. Patient with decreased energy and strength globally. Patient did fairly well with exercises, manual therapy and education today. We discussed progress, HEP and recommended consistency with HEP and dilators. Treatment session focused on pelvic floor strengthening to improve bowel control. Patient had mild  difficulty with breath holding. Patient is progressing slowly towards goals and will benefit from continued PT to address deficits, reduce fatigue, decreased balance and improve quality of life.        Last session Patient is a 68 y.o. F who was seen today for physical therapy   treatment for anal cancer. Pt present for follow up with currently no longer having cancer treatments. Has completed radiation and needed to stop chemo about 3/4 of treatment due to reaction. Pt reports increased urgency, frequency of bowels with incontinence, pain with bowel and bladder voids, and continued weakness and balance deficits from evaluation. Pt reports she has been doing walking program daily but hasn't been doing HEP much due to difficulty on her knees, HEP updated today to remove these exercises. All HEP exercises completed during session today and pt demonstrated good technique with minimal cues and denied pain but does endorse tightness felt. Pt  cued throughout for relaxation, decreased gripping in abdomen and glutes, and for dropping shoulders. Pt would benefit from additional PT to further address deficits.    OBJECTIVE IMPAIRMENTS: Abnormal gait, decreased activity tolerance, decreased balance, decreased coordination, decreased endurance, difficulty walking, decreased strength, and impaired tone.   ACTIVITY LIMITATIONS: squatting and caring for others  PARTICIPATION LIMITATIONS: community activity and yard work  PERSONAL FACTORS: 1 comorbidity: anal cancer are also affecting patient's functional outcome.   REHAB POTENTIAL: Good  CLINICAL DECISION MAKING: Stable/uncomplicated  EVALUATION COMPLEXITY: Low   GOALS: Goals reviewed with patient? Yes  SHORT TERM GOALS: Target date: 4 weeks after her treatment ends    Pt will be independent with HEP.   Baseline: Goal status: progressing 05/01/2025   LONG TERM GOALS: Target date: 10/11/2024    Pt will be independent with advanced HEP.   Baseline:  Goal status: progressing  2.  Pt will be able to participate in at least 45 mins exercise session without increased fatigue Baseline:  Goal status: INITIAL  3.  Pt will dem at least 12 sec for 5xSTS to demonstrate good hip strength to help with ease of transfers in and out of  the car , bed and chair Baseline: 13 sec Goal status: INITIAL  4.  Pt will dem good balance to reduce risk of fall Baseline:  Goal status: INITIAL  5.  Pt will have 0 pelvic pain, be able to contract/ relax and bulge her pelvic floor with good strength and coordination Baseline:  Goal status: INITIAL   PLAN:  PT FREQUENCY: 1-2x/week  PT DURATION: 12 weeks  PLANNED INTERVENTIONS: 97110-Therapeutic exercises, 97530- Therapeutic activity, 97112- Neuromuscular re-education, 97535- Self Care, [id number]- Manual therapy, 97032- Electrical stimulation (manual), Taping, Dry Needling, Joint mobilization, Joint manipulation, Spinal manipulation, Spinal mobilization, Manual lymph drainage, Compression bandaging, Cryotherapy, Moist heat, and Biofeedback  PLAN FOR NEXT SESSION: re- eval, internal pelvic floor muscle assessment, develop HEP, strengthening post tx   Marque Bango, PT 05/01/24 8:52 AM  [facility] [address] [place], [place] [address] Phone # [phone] Fax [phone]

## 2024-05-05 ENCOUNTER — Other Ambulatory Visit (HOSPITAL_COMMUNITY): Payer: Self-pay

## 2024-05-10 ENCOUNTER — Ambulatory Visit: Admitting: Physical Therapy

## 2024-05-10 DIAGNOSIS — M6281 Muscle weakness (generalized): Secondary | ICD-10-CM

## 2024-05-10 DIAGNOSIS — M62838 Other muscle spasm: Secondary | ICD-10-CM

## 2024-05-10 DIAGNOSIS — R293 Abnormal posture: Secondary | ICD-10-CM

## 2024-05-10 NOTE — Therapy (Signed)
 OUTPATIENT PHYSICAL THERAPY FEMALE PELVIC TREATMENT- re certification   Patient Name: Andrea [name] MRN: [id number] DOB:August 31, 1956, 68 y.o., female Today's Date: 05/10/2024  END OF SESSION:  PT End of Session - 05/10/24 1107     Visit Number 6    Date for PT Re-Evaluation 08/01/24    Authorization Type Medicare    Progress Note Due on Visit 10    PT Start Time 1102    PT Stop Time 1140    PT Time Calculation (min) 38 min    Activity Tolerance Patient tolerated treatment well    Behavior During Therapy WFL for tasks assessed/performed           Past Medical History:  Diagnosis Date   Anemia    Anginal pain (HCC)    Arthritis    Chest discomfort    Disorder of immune system (HCC)    Dyspnea    Dyspnea on exertion    Hypercholesterolemia    Hypertension    Migraine    Non-ulcer dyspepsia    Osteopenia of spine    Peptic ulcer disease    Pneumonia    Polyp of rectum    cancerous   Pre-diabetes    Skin cancer    Ulcerative colitis (HCC)    Past Surgical History:  Procedure Laterality Date   ANKLE FRACTURE SURGERY Left    CARDIAC CATHETERIZATION     COLONOSCOPY     ESOPHAGOGASTRODUODENOSCOPY     hip replacement Bilateral    LEFT HEART CATH AND CORONARY ANGIOGRAPHY N/A 01/21/2022   Procedure: LEFT HEART CATH AND CORONARY ANGIOGRAPHY;  Surgeon: Verlin Lonni JONETTA, MD;  Location: [facility];  Service: Cardiovascular;  Laterality: N/A;   LEFT HEART CATH AND CORONARY ANGIOGRAPHY N/A 02/03/2024   Procedure: LEFT HEART CATH AND CORONARY ANGIOGRAPHY;  Surgeon: Darron Deatrice LABOR, MD;  Location: [facility];  Service: Cardiovascular;  Laterality: N/A;   RECTAL BIOPSY N/A 05/04/2022   Procedure: BIOPSY RECTAL VS EXCISION OF PERIANAL LESION UNDER ANOSCOPY;  Surgeon: Teresa Lonni HERO, MD;  Location: [facility];  Service: General;  Laterality: N/A;   TOTAL KNEE ARTHROPLASTY Left 10/19/2022   Procedure: LEFT TOTAL KNEE ARTHROPLASTY;  Surgeon: Jerri Kay HERO,  MD;  Location: [facility];  Service: Orthopedics;  Laterality: Left;   TUBAL LIGATION     TUMOR EXCISION N/A 01/14/2024   Procedure: TRANSRECTAL TUMOR EXCISION;  Surgeon: Teresa Lonni HERO, MD;  Location: [facility];  Service: General;  Laterality: N/A;  Transanal excision of distal rectal and anal lesion (wide local)   Patient Active Problem List   Diagnosis Date Noted   Anal cancer (HCC) 02/28/2024   Coronary vasospasm (HCC) 02/28/2024   Myopericarditis 02/04/2024   Primary hypertension 02/04/2024   Non-ST elevation (NSTEMI) myocardial infarction (HCC) 02/03/2024   Chest pain 02/03/2024   Pleuritic chest pain 02/02/2024   Coronary artery disease due to lipid rich plaque 02/02/2024   Aortic atherosclerosis (HCC) 02/02/2024   Port-A-Cath in place 01/31/2024   Anal squamous cell carcinoma (HCC) 01/20/2024   AIN III (anal intraepithelial neoplasia III) 11/09/2023   Anal irritation 11/09/2023   History of anal cancer 11/09/2023   Status post total left knee replacement 10/19/2022   Primary osteoarthritis of left knee 09/30/2022   Chest pain of uncertain etiology    Ulcerative colitis (HCC) 07/06/2019   PUD (peptic ulcer disease) 07/06/2019    PCP: Marvene Prentice SAUNDERS, FNP  REFERRING PROVIDER:  Lanny Callander, MD  REFERRING DIAG: C21.0 (ICD-10-CM) - Anal cancer (HCC)  THERAPY DIAG:  Muscle weakness (generalized)  Abnormal posture  Other muscle spasm  Rationale for Evaluation and Treatment: Rehabilitation  ONSET DATE: 12/2023  SUBJECTIVE:                                                                                                                                                                                           SUBJECTIVE STATEMENT: Pt reports she is now using a hydrocortisone  suppository to help with pain with passing stools. Sometimes urine as well. Hasn't attempted dilators.      Fluid intake: water - 1 quart, coffee in morning 1 cups, tea daily  1x  PAIN:  PAIN:  Are you having pain? Yes NPRS scale: 7/10 Pain location: rectum  Pain orientation: Other: rectal   PAIN TYPE: burning and sharp Pain description: constant  Aggravating factors: passing stool, goes away a few mins after emptying Relieving factors: just finishing bowel movement    PRECAUTIONS: None  RED FLAGS: None   WEIGHT BEARING RESTRICTIONS: No  FALLS:  Has patient fallen in last 6 months? No  OCCUPATION: retired  ACTIVITY LEVEL : not active in the last 3 weeks  PLOF: Independent  PATIENT GOALS: does not know  PERTINENT HISTORY:  ANKLE FRACTURE SURGERY Left     CARDIAC CATHETERIZATION      COLONOSCOPY      ESOPHAGOGASTRODUODENOSCOPY      hip replacement Bilateral     LEFT HEART CATH AND CORONARY ANGIOGRAPHY N/A 01/21/2022 Procedure: LEFT HEART CATH AND CORONARY ANGIOGRAPHY;  Surgeon: Verlin Lonni BIRCH, MD;  Location: [facility];  Service: Cardiovascular;  Laterality: N/A;   RECTAL BIOPSY N/A 05/04/2022 Procedure: BIOPSY RECTAL VS EXCISION OF PERIANAL LESION UNDER ANOSCOPY;  Surgeon: Teresa Lonni HERO, MD;  Location: [facility];  Service: General;  Laterality: N/A;   TOTAL KNEE ARTHROPLASTY Left 10/19/2022 Procedure: LEFT TOTAL KNEE ARTHROPLASTY;  Surgeon: Jerri Kay HERO, MD;  Location: [facility];  Service: Orthopedics;  Laterality: Left;   TUBAL LIGATION      TUMOR EXCISION        Sexual abuse: No  BOWEL MOVEMENT: Pain with bowel movement: Yes Type of bowel movement:Type (Bristol Stool Scale) 5-6, Frequency 2-3x daily, and Strain yes Fully empty rectum: No Leakage: Yes: very short warning and will loss full bowels if can't make it quickly enough  Pads: Yes: 3-4x daily, no longer wearing at night  Fiber supplement/laxative No  URINATION: Pain with urination: yes - at start of treatments was exteremly painful but now a little  Fully empty bladder: Yes:  Stream: Strong Urgency: Yes  Frequency: every 2 hours, usually 3-4x nightly   Leakage: none Pads: No  INTERCOURSE: not active   PREGNANCY: Vaginal deliveries 3 Tearing Yes: big babies PROLAPSE: None   OBJECTIVE:  Note: Objective measures were completed at Evaluation unless otherwise noted.  DIAGNOSTIC FINDINGS:  Anal cancer  PATIENT SURVEYS:    PFIQ-7: 0 04/10/24: 179  COGNITION: Overall cognitive status: Within functional limits for tasks assessed     SENSATION: Light touch: Appears intact  LUMBAR SPECIAL TESTS:  Straight leg raise test: Negative  FUNCTIONAL TESTS:  5 times sit to stand: 13 seconds  GAIT: Assistive device utilized: None Comments: antalgic  POSTURE: rounded shoulders and forward head   LUMBARAROM/PROM: full   LOWER EXTREMITY ROM: full  LOWER EXTREMITY MMT: right hip 4-/5 flexion, left hip 4/5  PALPATION:   General: upper chest breathing, no tightness bilateral hip scars, low abdominal tone  Pelvic Alignment: even  Abdominal: abdominal doming supra umbilical, low abdominal tone                 External Perineal Exam: mild dryness noticed                             Internal Pelvic Floor: tight and tender throughout  Patient confirms identification and approves PT to assess internal pelvic floor and treatment yes Patient confirms identification and approves PT to assess internal pelvic floor and treatment Yes No emotional/communication barriers or cognitive limitation. Patient is motivated to learn. Patient understands and agrees with treatment goals and plan. PT explains patient will be examined in standing, sitting, and lying down to see how their muscles and joints work. When they are ready, they will be asked to remove their underwear so PT can examine their perineum. The patient is also given the option of providing their own chaperone as one is not provided in our facility. The patient also has the right and is explained the right to defer or refuse any part of the evaluation or treatment including the  internal exam. With the patient's consent, PT will use one gloved finger to gently assess the muscles of the pelvic floor, seeing how well it contracts and relaxes and if there is muscle symmetry. After, the patient will get dressed and PT and patient will discuss exam findings and plan of care. PT and patient discuss plan of care, schedule, attendance policy and HEP activities. - 04/24/2024     PELVIC MMT:   MMT eval  Vaginal 3/5  Internal Anal Sphincter   External Anal Sphincter   Puborectalis   Diastasis Recti 1 finger at and under umbilicus, doming above umbilicus  (Blank rows = not tested)        TONE: high  PROLAPSE: None noticed in hooklying  TODAY'S TREATMENT:  DATE:   05/10/24: X5 lumbar rotation X10 windshield wipers Single knee to chest 2x30s Hamstring stretch with strap 3x30s  each Unilateral happy baby 2x30s Seated happy baby 2x30s Ball sitting with x10 diaphragmatic breathing with pelvic drops, x10 pelvic circles each way, ant/post pelvic tilts x10  Reviewed diaphragmatic breathing with voiding bowels and bladder to decreased pain with emptying, mechanics   04/25/2024 Internal pelvic floor muscle assessment There acts- Education on dilators Neurom reed- open books Review of HEP Diaphragmatic breathing with pelvic floor drop Single knee to chest 3x10 sec Hip rotations 15 reps QL stretch bilateral 10 reps   04/17/24: Windshield wipers x10 Single knee to chest 3x30s Butterfly 3x30s Single leg butterfly 2x30s each Open books x10 each Thoracic extension with foam roller along spine length - 1 min static with pelvic floor drops and diaphragmatic breathing, x10 wide claps, x10 angle wings     PATIENT EDUCATION:  Education details: relevant anatomy, exam findings, expectations of PT, chemo, radiation Person educated: Patient Education  method: Explanation, Demonstration, Tactile cues, Verbal cues, and Handouts Education comprehension: verbalized understanding, returned demonstration, verbal cues required, tactile cues required, and needs further education  HOME EXERCISE PROGRAM: Access Code: [email or web address] URL: [email or web address] Date: 04/17/2024 Prepared by: Darryle  Exercises - Walk for 30 mins total a day as able  - 1 x daily - 7 x weekly - Sidelying Thoracic Rotation with Open Book  - 1 x daily - 7 x weekly - 1 sets - 10 reps - Supine Hip Internal and External Rotation  - 1 x daily - 7 x weekly - 1 sets - 10 reps - Single Knee to Chest Stretch  - 1 x daily - 7 x weekly - 1 sets - 3 reps - 30s holds - Supported Teacher, music with Pelvic Floor Relaxation  - 1 x daily - 7 x weekly - 1 sets - 3 reps - 30s holds - Unilateral Supported Supine Butterfly Stretch  - 1 x daily - 7 x weekly - 1 sets - 3 reps - 30s holds  Patient Education - Bowel Emptying Techniques - Abdominal Massage for Constipation  ASSESSMENT:  CLINICAL IMPRESSION: Patient was seen today for treatment of anal cancer rehab. Patient tolerated session well without increased pain but does demonstrate continued tension with hips and reports tightness in pelvic floor. Pt declined internal manual in clinic but reports she will attempt dilator at home this week and see if she can do it.  Patient is progressing well towards goals and will benefit from continued PT to address deficits, reduce pain with bowel movements and bladder voids and improve quality of life.       Last session Patient is a 68 y.o. F who was seen today for physical therapy  treatment for anal cancer. Pt present for follow up with currently no longer having cancer treatments. Has completed radiation and needed to stop chemo about 3/4 of treatment due to reaction. Pt reports increased urgency, frequency of bowels with incontinence, pain with bowel and bladder voids, and continued  weakness and balance deficits from evaluation. Pt reports she has been doing walking program daily but hasn't been doing HEP much due to difficulty on her knees, HEP updated today to remove these exercises. All HEP exercises completed during session today and pt demonstrated good technique with minimal cues and denied pain but does endorse tightness felt. Pt cued throughout for relaxation, decreased gripping in abdomen and glutes, and for dropping shoulders. Pt would benefit from additional PT  to further address deficits.    OBJECTIVE IMPAIRMENTS: Abnormal gait, decreased activity tolerance, decreased balance, decreased coordination, decreased endurance, difficulty walking, decreased strength, and impaired tone.   ACTIVITY LIMITATIONS: squatting and caring for others  PARTICIPATION LIMITATIONS: community activity and yard work  PERSONAL FACTORS: 1 comorbidity: anal cancer are also affecting patient's functional outcome.   REHAB POTENTIAL: Good  CLINICAL DECISION MAKING: Stable/uncomplicated  EVALUATION COMPLEXITY: Low   GOALS: Goals reviewed with patient? Yes  SHORT TERM GOALS: Target date: 4 weeks after her treatment ends    Pt will be independent with HEP.   Baseline: Goal status: INITIAL   LONG TERM GOALS: Target date: 04/20/2024 updated 08/01/2024    Pt will be independent with advanced HEP.   Baseline:  Goal status: progressing  2.  Pt will be able to participate in at least 45 mins exercise session without increased fatigue Baseline:  Goal status: INITIAL  3.  Pt will dem at least 12 sec for 5xSTS to demonstrate good hip strength to help with ease of transfers in and out of the car , bed and chair Baseline:  Goal status: INITIAL  4.  Pt will dem good balance to reduce risk of fall Baseline:  Goal status: progressing  5.  Pt will have 0 pelvic pain, be able to contract/ relax and bulge her pelvic floor with good strength and coordination Baseline:  Goal status:  INITIAL   PLAN:  PT FREQUENCY: 1-2x/week  PT DURATION: 12 weeks  PLANNED INTERVENTIONS: 97110-Therapeutic exercises, 97530- Therapeutic activity, 97112- Neuromuscular re-education, 97535- Self Care, [id number]- Manual therapy, 97032- Electrical stimulation (manual), Taping, Dry Needling, Joint mobilization, Joint manipulation, Spinal manipulation, Spinal mobilization, Manual lymph drainage, Compression bandaging, Cryotherapy, Moist heat, and Biofeedback  PLAN FOR NEXT SESSION: re- eval, internal pelvic floor muscle assessment, develop HEP, strengthening post tx  Darryle Navy, PT, DPT 05/10/2510:47 AM  [facility] [address], [address] [place], [place] [address] Phone # [phone] Fax [phone]

## 2024-05-11 ENCOUNTER — Ambulatory Visit (INDEPENDENT_AMBULATORY_CARE_PROVIDER_SITE_OTHER): Admitting: *Deleted

## 2024-05-11 VITALS — BP 152/73 | HR 76 | Temp 98.0°F | Resp 16 | Ht 65.0 in | Wt 167.2 lb

## 2024-05-11 DIAGNOSIS — K5130 Ulcerative (chronic) rectosigmoiditis without complications: Secondary | ICD-10-CM

## 2024-05-11 MED ORDER — VEDOLIZUMAB 300 MG IV SOLR
300.0000 mg | Freq: Once | INTRAVENOUS | Status: AC
  Administered 2024-05-11: 300 mg via INTRAVENOUS
  Filled 2024-05-11: qty 5

## 2024-05-12 ENCOUNTER — Other Ambulatory Visit: Payer: Self-pay

## 2024-05-18 ENCOUNTER — Ambulatory Visit: Admitting: Physical Therapy

## 2024-05-18 DIAGNOSIS — M6281 Muscle weakness (generalized): Secondary | ICD-10-CM | POA: Diagnosis not present

## 2024-05-18 DIAGNOSIS — R293 Abnormal posture: Secondary | ICD-10-CM

## 2024-05-18 DIAGNOSIS — M62838 Other muscle spasm: Secondary | ICD-10-CM

## 2024-05-18 NOTE — Therapy (Signed)
 OUTPATIENT PHYSICAL THERAPY FEMALE PELVIC TREATMENT   Patient Name: Andrea [name] MRN: [id number] DOB:Aug 03, 1956, 68 y.o., female Today's Date: 05/18/2024  END OF SESSION:  PT End of Session - 05/18/24 1105     Visit Number 7    Date for PT Re-Evaluation 08/01/24    Authorization Type Medicare    Progress Note Due on Visit 10    PT Start Time 1101    PT Stop Time 1140    PT Time Calculation (min) 39 min    Activity Tolerance Patient tolerated treatment well    Behavior During Therapy WFL for tasks assessed/performed           Past Medical History:  Diagnosis Date   Anemia    Anginal pain (HCC)    Arthritis    Chest discomfort    Disorder of immune system (HCC)    Dyspnea    Dyspnea on exertion    Hypercholesterolemia    Hypertension    Migraine    Non-ulcer dyspepsia    Osteopenia of spine    Peptic ulcer disease    Pneumonia    Polyp of rectum    cancerous   Pre-diabetes    Skin cancer    Ulcerative colitis (HCC)    Past Surgical History:  Procedure Laterality Date   ANKLE FRACTURE SURGERY Left    CARDIAC CATHETERIZATION     COLONOSCOPY     ESOPHAGOGASTRODUODENOSCOPY     hip replacement Bilateral    LEFT HEART CATH AND CORONARY ANGIOGRAPHY N/A 01/21/2022   Procedure: LEFT HEART CATH AND CORONARY ANGIOGRAPHY;  Surgeon: Verlin Lonni JONETTA, MD;  Location: [facility];  Service: Cardiovascular;  Laterality: N/A;   LEFT HEART CATH AND CORONARY ANGIOGRAPHY N/A 02/03/2024   Procedure: LEFT HEART CATH AND CORONARY ANGIOGRAPHY;  Surgeon: Darron Deatrice LABOR, MD;  Location: [facility];  Service: Cardiovascular;  Laterality: N/A;   RECTAL BIOPSY N/A 05/04/2022   Procedure: BIOPSY RECTAL VS EXCISION OF PERIANAL LESION UNDER ANOSCOPY;  Surgeon: Teresa Lonni HERO, MD;  Location: [facility];  Service: General;  Laterality: N/A;   TOTAL KNEE ARTHROPLASTY Left 10/19/2022   Procedure: LEFT TOTAL KNEE ARTHROPLASTY;  Surgeon: Jerri Kay HERO, MD;  Location: [facility];  Service: Orthopedics;  Laterality: Left;   TUBAL LIGATION     TUMOR EXCISION N/A 01/14/2024   Procedure: TRANSRECTAL TUMOR EXCISION;  Surgeon: Teresa Lonni HERO, MD;  Location: [facility];  Service: General;  Laterality: N/A;  Transanal excision of distal rectal and anal lesion (wide local)   Patient Active Problem List   Diagnosis Date Noted   Anal cancer (HCC) 02/28/2024   Coronary vasospasm (HCC) 02/28/2024   Myopericarditis 02/04/2024   Primary hypertension 02/04/2024   Non-ST elevation (NSTEMI) myocardial infarction (HCC) 02/03/2024   Chest pain 02/03/2024   Pleuritic chest pain 02/02/2024   Coronary artery disease due to lipid rich plaque 02/02/2024   Aortic atherosclerosis (HCC) 02/02/2024   Port-A-Cath in place 01/31/2024   Anal squamous cell carcinoma (HCC) 01/20/2024   AIN III (anal intraepithelial neoplasia III) 11/09/2023   Anal irritation 11/09/2023   History of anal cancer 11/09/2023   Status post total left knee replacement 10/19/2022   Primary osteoarthritis of left knee 09/30/2022   Chest pain of uncertain etiology    Ulcerative colitis (HCC) 07/06/2019   PUD (peptic ulcer disease) 07/06/2019    PCP: Marvene Prentice SAUNDERS, FNP  REFERRING PROVIDER:  Lanny Callander, MD  REFERRING DIAG:  C21.0 (ICD-10-CM) - Anal cancer (HCC)  THERAPY DIAG:  Muscle weakness (generalized)  Abnormal posture  Other muscle spasm  Rationale for Evaluation and Treatment: Rehabilitation  ONSET DATE: 12/2023  SUBJECTIVE:                                                                                                                                                                                           SUBJECTIVE STATEMENT: Did try vaginal dilator smallest size did have 4/10 pain with penetration and mobility. Does still have pain with bowel movements but improving, does have 7/10 pain with urination as well but not consistently doesn't see a pattern.      Fluid  intake: water - 1 quart, coffee in morning 1 cups, tea daily 1x  PAIN:  PAIN:  Are you having pain? Yes NPRS scale: 7/10 Pain location: rectum  Pain orientation: Other: rectal   PAIN TYPE: burning and sharp Pain description: constant  Aggravating factors: passing stool, goes away a few mins after emptying Relieving factors: just finishing bowel movement    PRECAUTIONS: None  RED FLAGS: None   WEIGHT BEARING RESTRICTIONS: No  FALLS:  Has patient fallen in last 6 months? No  OCCUPATION: retired  ACTIVITY LEVEL : not active in the last 3 weeks  PLOF: Independent  PATIENT GOALS: does not know  PERTINENT HISTORY:  ANKLE FRACTURE SURGERY Left     CARDIAC CATHETERIZATION      COLONOSCOPY      ESOPHAGOGASTRODUODENOSCOPY      hip replacement Bilateral     LEFT HEART CATH AND CORONARY ANGIOGRAPHY N/A 01/21/2022 Procedure: LEFT HEART CATH AND CORONARY ANGIOGRAPHY;  Surgeon: Verlin Lonni BIRCH, MD;  Location: [facility];  Service: Cardiovascular;  Laterality: N/A;   RECTAL BIOPSY N/A 05/04/2022 Procedure: BIOPSY RECTAL VS EXCISION OF PERIANAL LESION UNDER ANOSCOPY;  Surgeon: Teresa Lonni HERO, MD;  Location: [facility];  Service: General;  Laterality: N/A;   TOTAL KNEE ARTHROPLASTY Left 10/19/2022 Procedure: LEFT TOTAL KNEE ARTHROPLASTY;  Surgeon: Jerri Kay HERO, MD;  Location: [facility];  Service: Orthopedics;  Laterality: Left;   TUBAL LIGATION      TUMOR EXCISION        Sexual abuse: No  BOWEL MOVEMENT: Pain with bowel movement: Yes Type of bowel movement:Type (Bristol Stool Scale) 5-6, Frequency 2-3x daily, and Strain yes Fully empty rectum: No Leakage: Yes: very short warning and will loss full bowels if can't make it quickly enough  Pads: Yes: 3-4x daily, no longer wearing at night  Fiber supplement/laxative No  URINATION: Pain with urination: yes - at start of treatments was exteremly painful but  now a little  Fully empty bladder: Yes:  Stream:  Strong Urgency: Yes  Frequency: every 2 hours, usually 3-4x nightly  Leakage: none Pads: No  INTERCOURSE: not active   PREGNANCY: Vaginal deliveries 3 Tearing Yes: big babies PROLAPSE: None   OBJECTIVE:  Note: Objective measures were completed at Evaluation unless otherwise noted.  DIAGNOSTIC FINDINGS:  Anal cancer  PATIENT SURVEYS:    PFIQ-7: 0 04/10/24: 179  COGNITION: Overall cognitive status: Within functional limits for tasks assessed     SENSATION: Light touch: Appears intact  LUMBAR SPECIAL TESTS:  Straight leg raise test: Negative  FUNCTIONAL TESTS:  5 times sit to stand: 13 seconds  GAIT: Assistive device utilized: None Comments: antalgic  POSTURE: rounded shoulders and forward head   LUMBARAROM/PROM: full   LOWER EXTREMITY ROM: full  LOWER EXTREMITY MMT: right hip 4-/5 flexion, left hip 4/5  PALPATION:   General: upper chest breathing, no tightness bilateral hip scars, low abdominal tone  Pelvic Alignment: even  Abdominal: abdominal doming supra umbilical, low abdominal tone                 External Perineal Exam: mild dryness noticed                             Internal Pelvic Floor: tight and tender throughout  Patient confirms identification and approves PT to assess internal pelvic floor and treatment yes Patient confirms identification and approves PT to assess internal pelvic floor and treatment Yes No emotional/communication barriers or cognitive limitation. Patient is motivated to learn. Patient understands and agrees with treatment goals and plan. PT explains patient will be examined in standing, sitting, and lying down to see how their muscles and joints work. When they are ready, they will be asked to remove their underwear so PT can examine their perineum. The patient is also given the option of providing their own chaperone as one is not provided in our facility. The patient also has the right and is explained the right to defer  or refuse any part of the evaluation or treatment including the internal exam. With the patient's consent, PT will use one gloved finger to gently assess the muscles of the pelvic floor, seeing how well it contracts and relaxes and if there is muscle symmetry. After, the patient will get dressed and PT and patient will discuss exam findings and plan of care. PT and patient discuss plan of care, schedule, attendance policy and HEP activities. - 04/24/2024     PELVIC MMT:   MMT eval  Vaginal 3/5  Internal Anal Sphincter   External Anal Sphincter   Puborectalis   Diastasis Recti 1 finger at and under umbilicus, doming above umbilicus  (Blank rows = not tested)        TONE: high  PROLAPSE: None noticed in hooklying  TODAY'S TREATMENT:  DATE:   05/18/24: Manual - diaphragm release bil for improved rib mobility and decreased tension at pelvis  Fascial release at abdominal quadrants for decreased tension at pelvic floor and bladder Single knee to chest 3x30s Butterfly stretch 3x30s Ant/post quad rocking with alt hip IR/ER x10 Seated on blue yoga ball pelvic circles x10 rt/lt, pelvic tilts x10 Lumbar roll outs x10 Rt/Lt/Ct    05/10/24: X5 lumbar rotation X10 windshield wipers Single knee to chest 2x30s Hamstring stretch with strap 3x30s  each Unilateral happy baby 2x30s Seated happy baby 2x30s Ball sitting with x10 diaphragmatic breathing with pelvic drops, x10 pelvic circles each way, ant/post pelvic tilts x10  Reviewed diaphragmatic breathing with voiding bowels and bladder to decreased pain with emptying, mechanics   04/25/2024 Internal pelvic floor muscle assessment There acts- Education on dilators Neurom reed- open books Review of HEP Diaphragmatic breathing with pelvic floor drop Single knee to chest 3x10 sec Hip rotations 15 reps QL stretch  bilateral 10 reps PATIENT EDUCATION:  Education details: relevant anatomy, exam findings, expectations of PT, chemo, radiation Person educated: Patient Education method: Explanation, Demonstration, Tactile cues, Verbal cues, and Handouts Education comprehension: verbalized understanding, returned demonstration, verbal cues required, tactile cues required, and needs further education  HOME EXERCISE PROGRAM: Access Code: [email or web address] URL: [email or web address] Date: 04/17/2024 Prepared by: Darryle  Exercises - Walk for 30 mins total a day as able  - 1 x daily - 7 x weekly - Sidelying Thoracic Rotation with Open Book  - 1 x daily - 7 x weekly - 1 sets - 10 reps - Supine Hip Internal and External Rotation  - 1 x daily - 7 x weekly - 1 sets - 10 reps - Single Knee to Chest Stretch  - 1 x daily - 7 x weekly - 1 sets - 3 reps - 30s holds - Supported Teacher, music with Pelvic Floor Relaxation  - 1 x daily - 7 x weekly - 1 sets - 3 reps - 30s holds - Unilateral Supported Supine Butterfly Stretch  - 1 x daily - 7 x weekly - 1 sets - 3 reps - 30s holds  Patient Education - Bowel Emptying Techniques - Abdominal Massage for Constipation  ASSESSMENT:  CLINICAL IMPRESSION: Patient was seen today for treatment of anal cancer rehab. Patient tolerated session well without increased pain but does demonstrate continued tension with hips and reports tightness in pelvic floor. Pt declined internal manual in clinic but reports she will continue dilator at home this week..  pt does continue to report pain with voids and mild urinary incontinence with urge. Patient is progressing well towards goals and will benefit from continued PT to address deficits, reduce pain with bowel movements and bladder voids and improve quality of life.       Last session Patient is a 69 y.o. F who was seen today for physical therapy  treatment for anal cancer. Pt present for follow up with currently no longer having  cancer treatments. Has completed radiation and needed to stop chemo about 3/4 of treatment due to reaction. Pt reports increased urgency, frequency of bowels with incontinence, pain with bowel and bladder voids, and continued weakness and balance deficits from evaluation. Pt reports she has been doing walking program daily but hasn't been doing HEP much due to difficulty on her knees, HEP updated today to remove these exercises. All HEP exercises completed during session today and pt demonstrated good technique with minimal cues and denied pain but does endorse tightness felt.  Pt cued throughout for relaxation, decreased gripping in abdomen and glutes, and for dropping shoulders. Pt would benefit from additional PT to further address deficits.    OBJECTIVE IMPAIRMENTS: Abnormal gait, decreased activity tolerance, decreased balance, decreased coordination, decreased endurance, difficulty walking, decreased strength, and impaired tone.   ACTIVITY LIMITATIONS: squatting and caring for others  PARTICIPATION LIMITATIONS: community activity and yard work  PERSONAL FACTORS: 1 comorbidity: anal cancer are also affecting patient's functional outcome.   REHAB POTENTIAL: Good  CLINICAL DECISION MAKING: Stable/uncomplicated  EVALUATION COMPLEXITY: Low   GOALS: Goals reviewed with patient? Yes  SHORT TERM GOALS: Target date: 4 weeks after her treatment ends    Pt will be independent with HEP.   Baseline: Goal status: INITIAL   LONG TERM GOALS: Target date: 04/20/2024 updated 08/01/2024    Pt will be independent with advanced HEP.   Baseline:  Goal status: progressing  2.  Pt will be able to participate in at least 45 mins exercise session without increased fatigue Baseline:  Goal status: INITIAL  3.  Pt will dem at least 12 sec for 5xSTS to demonstrate good hip strength to help with ease of transfers in and out of the car , bed and chair Baseline:  Goal status: INITIAL  4.  Pt will dem  good balance to reduce risk of fall Baseline:  Goal status: progressing  5.  Pt will have 0 pelvic pain, be able to contract/ relax and bulge her pelvic floor with good strength and coordination Baseline:  Goal status: INITIAL   PLAN:  PT FREQUENCY: 1-2x/week  PT DURATION: 12 weeks  PLANNED INTERVENTIONS: 97110-Therapeutic exercises, 97530- Therapeutic activity, 97112- Neuromuscular re-education, 97535- Self Care, [id number]- Manual therapy, 97032- Electrical stimulation (manual), Taping, Dry Needling, Joint mobilization, Joint manipulation, Spinal manipulation, Spinal mobilization, Manual lymph drainage, Compression bandaging, Cryotherapy, Moist heat, and Biofeedback  PLAN FOR NEXT SESSION: re- eval, internal pelvic floor muscle assessment, develop HEP, strengthening post tx  Darryle Navy, PT, DPT 05/18/2510:46 AM  [facility] [address] [place], [place] [address] Phone # [phone] Fax [phone]

## 2024-05-24 ENCOUNTER — Ambulatory Visit: Attending: Hematology | Admitting: Physical Therapy

## 2024-05-24 DIAGNOSIS — M6281 Muscle weakness (generalized): Secondary | ICD-10-CM | POA: Diagnosis present

## 2024-05-24 DIAGNOSIS — M62838 Other muscle spasm: Secondary | ICD-10-CM | POA: Diagnosis present

## 2024-05-24 DIAGNOSIS — R293 Abnormal posture: Secondary | ICD-10-CM | POA: Diagnosis present

## 2024-05-24 NOTE — Therapy (Signed)
 OUTPATIENT PHYSICAL THERAPY FEMALE PELVIC TREATMENT   Patient Name: Andrea [name] MRN: [id number] DOB:11/25/1955, 68 y.o., female Today's Date: 05/24/2024  END OF SESSION:  PT End of Session - 05/24/24 1107     Visit Number 8    Date for PT Re-Evaluation 08/01/24    Authorization Type Medicare    Progress Note Due on Visit 10    PT Start Time 1104    PT Stop Time 1142    PT Time Calculation (min) 38 min    Activity Tolerance Patient tolerated treatment well    Behavior During Therapy WFL for tasks assessed/performed           Past Medical History:  Diagnosis Date   Anemia    Anginal pain (HCC)    Arthritis    Chest discomfort    Disorder of immune system (HCC)    Dyspnea    Dyspnea on exertion    Hypercholesterolemia    Hypertension    Migraine    Non-ulcer dyspepsia    Osteopenia of spine    Peptic ulcer disease    Pneumonia    Polyp of rectum    cancerous   Pre-diabetes    Skin cancer    Ulcerative colitis (HCC)    Past Surgical History:  Procedure Laterality Date   ANKLE FRACTURE SURGERY Left    CARDIAC CATHETERIZATION     COLONOSCOPY     ESOPHAGOGASTRODUODENOSCOPY     hip replacement Bilateral    LEFT HEART CATH AND CORONARY ANGIOGRAPHY N/A 01/21/2022   Procedure: LEFT HEART CATH AND CORONARY ANGIOGRAPHY;  Surgeon: Verlin Lonni JONETTA, MD;  Location: [facility];  Service: Cardiovascular;  Laterality: N/A;   LEFT HEART CATH AND CORONARY ANGIOGRAPHY N/A 02/03/2024   Procedure: LEFT HEART CATH AND CORONARY ANGIOGRAPHY;  Surgeon: Darron Deatrice LABOR, MD;  Location: [facility];  Service: Cardiovascular;  Laterality: N/A;   RECTAL BIOPSY N/A 05/04/2022   Procedure: BIOPSY RECTAL VS EXCISION OF PERIANAL LESION UNDER ANOSCOPY;  Surgeon: Teresa Lonni HERO, MD;  Location: [facility];  Service: General;  Laterality: N/A;   TOTAL KNEE ARTHROPLASTY Left 10/19/2022   Procedure: LEFT TOTAL KNEE ARTHROPLASTY;  Surgeon: Jerri Kay HERO, MD;  Location: [facility];  Service: Orthopedics;  Laterality: Left;   TUBAL LIGATION     TUMOR EXCISION N/A 01/14/2024   Procedure: TRANSRECTAL TUMOR EXCISION;  Surgeon: Teresa Lonni HERO, MD;  Location: [facility];  Service: General;  Laterality: N/A;  Transanal excision of distal rectal and anal lesion (wide local)   Patient Active Problem List   Diagnosis Date Noted   Anal cancer (HCC) 02/28/2024   Coronary vasospasm (HCC) 02/28/2024   Myopericarditis 02/04/2024   Primary hypertension 02/04/2024   Non-ST elevation (NSTEMI) myocardial infarction (HCC) 02/03/2024   Chest pain 02/03/2024   Pleuritic chest pain 02/02/2024   Coronary artery disease due to lipid rich plaque 02/02/2024   Aortic atherosclerosis (HCC) 02/02/2024   Port-A-Cath in place 01/31/2024   Anal squamous cell carcinoma (HCC) 01/20/2024   AIN III (anal intraepithelial neoplasia III) 11/09/2023   Anal irritation 11/09/2023   History of anal cancer 11/09/2023   Status post total left knee replacement 10/19/2022   Primary osteoarthritis of left knee 09/30/2022   Chest pain of uncertain etiology    Ulcerative colitis (HCC) 07/06/2019   PUD (peptic ulcer disease) 07/06/2019    PCP: Marvene Prentice SAUNDERS, FNP  REFERRING PROVIDER:  Lanny Callander, MD  REFERRING DIAG:  C21.0 (ICD-10-CM) - Anal cancer (HCC)  THERAPY DIAG:  Muscle weakness (generalized)  Abnormal posture  Other muscle spasm  Rationale for Evaluation and Treatment: Rehabilitation  ONSET DATE: 12/2023  SUBJECTIVE:                                                                                                                                                                                           SUBJECTIVE STATEMENT: Has moved up to vaginal dilator second size did have 3/10 pain with penetration and mobility, reports most pain with length/depth. Does still have pain with bowel movements but improving  4/10 pain now, urination is usually not painful now but 2-3x  this past week had spasms with urine and this is painful.  Also has had ~2-3 instance this past week pelvic floor fecal incontinence, not that loose but not fully formed but can't get there quickly enough. Decreased to 1 stool softener yesterday.     Fluid intake: water - 1 quart, coffee in morning 1 cups, tea daily 1x  PAIN: no 9/3 PAIN:  Are you having pain? Yes NPRS scale: 7/10 Pain location: rectum  Pain orientation: Other: rectal   PAIN TYPE: burning and sharp Pain description: constant  Aggravating factors: passing stool, goes away a few mins after emptying Relieving factors: just finishing bowel movement    PRECAUTIONS: None  RED FLAGS: None   WEIGHT BEARING RESTRICTIONS: No  FALLS:  Has patient fallen in last 6 months? No  OCCUPATION: retired  ACTIVITY LEVEL : not active in the last 3 weeks  PLOF: Independent  PATIENT GOALS: does not know  PERTINENT HISTORY:  ANKLE FRACTURE SURGERY Left     CARDIAC CATHETERIZATION      COLONOSCOPY      ESOPHAGOGASTRODUODENOSCOPY      hip replacement Bilateral     LEFT HEART CATH AND CORONARY ANGIOGRAPHY N/A 01/21/2022 Procedure: LEFT HEART CATH AND CORONARY ANGIOGRAPHY;  Surgeon: Verlin Lonni BIRCH, MD;  Location: [facility];  Service: Cardiovascular;  Laterality: N/A;   RECTAL BIOPSY N/A 05/04/2022 Procedure: BIOPSY RECTAL VS EXCISION OF PERIANAL LESION UNDER ANOSCOPY;  Surgeon: Teresa Lonni HERO, MD;  Location: [facility];  Service: General;  Laterality: N/A;   TOTAL KNEE ARTHROPLASTY Left 10/19/2022 Procedure: LEFT TOTAL KNEE ARTHROPLASTY;  Surgeon: Jerri Kay HERO, MD;  Location: [facility];  Service: Orthopedics;  Laterality: Left;   TUBAL LIGATION      TUMOR EXCISION        Sexual abuse: No  BOWEL MOVEMENT: Pain with bowel movement: Yes Type of bowel movement:Type (Bristol Stool Scale) 5-6, Frequency 2-3x daily, and Strain yes Fully empty  rectum: No Leakage: Yes: very short warning and will loss full bowels  if can't make it quickly enough  Pads: Yes: 3-4x daily, no longer wearing at night  Fiber supplement/laxative No  URINATION: Pain with urination: yes - at start of treatments was exteremly painful but now a little  Fully empty bladder: Yes:  Stream: Strong Urgency: Yes  Frequency: every 2 hours, usually 3-4x nightly  Leakage: none Pads: No  INTERCOURSE: not active   PREGNANCY: Vaginal deliveries 3 Tearing Yes: big babies PROLAPSE: None   OBJECTIVE:  Note: Objective measures were completed at Evaluation unless otherwise noted.  DIAGNOSTIC FINDINGS:  Anal cancer  PATIENT SURVEYS:    PFIQ-7: 0 04/10/24: 179  COGNITION: Overall cognitive status: Within functional limits for tasks assessed     SENSATION: Light touch: Appears intact  LUMBAR SPECIAL TESTS:  Straight leg raise test: Negative  FUNCTIONAL TESTS:  5 times sit to stand: 13 seconds  GAIT: Assistive device utilized: None Comments: antalgic  POSTURE: rounded shoulders and forward head   LUMBARAROM/PROM: full   LOWER EXTREMITY ROM: full  LOWER EXTREMITY MMT: right hip 4-/5 flexion, left hip 4/5  PALPATION:   General: upper chest breathing, no tightness bilateral hip scars, low abdominal tone  Pelvic Alignment: even  Abdominal: abdominal doming supra umbilical, low abdominal tone                 External Perineal Exam: mild dryness noticed                             Internal Pelvic Floor: tight and tender throughout  Patient confirms identification and approves PT to assess internal pelvic floor and treatment yes Patient confirms identification and approves PT to assess internal pelvic floor and treatment Yes No emotional/communication barriers or cognitive limitation. Patient is motivated to learn. Patient understands and agrees with treatment goals and plan. PT explains patient will be examined in standing, sitting, and lying down to see how their muscles and joints work. When they are ready,  they will be asked to remove their underwear so PT can examine their perineum. The patient is also given the option of providing their own chaperone as one is not provided in our facility. The patient also has the right and is explained the right to defer or refuse any part of the evaluation or treatment including the internal exam. With the patient's consent, PT will use one gloved finger to gently assess the muscles of the pelvic floor, seeing how well it contracts and relaxes and if there is muscle symmetry. After, the patient will get dressed and PT and patient will discuss exam findings and plan of care. PT and patient discuss plan of care, schedule, attendance policy and HEP activities. - 04/24/2024     PELVIC MMT:   MMT eval  Vaginal 3/5  Internal Anal Sphincter   External Anal Sphincter   Puborectalis   Diastasis Recti 1 finger at and under umbilicus, doming above umbilicus  (Blank rows = not tested)        TONE: high  PROLAPSE: None noticed in hooklying  TODAY'S TREATMENT:  DATE:  05/24/24: Lumbar roll outs x10 Rt/Lt/Ct  Seated on ball pelvic circles x10 each, hip shift 3x30s each, pelvic drops with diaphragmatic breathing x10 Standing L with hip IR rotation 3x30s Elevated lunge with trunk rotation 3x30s each Modified cat/cow x10 with moderate cues for techniques  Wind shield wipers x15 each Single knee to chest 3x30s 2x10 bridges  Hooklying blue band hip abduction 2x10  05/18/24: Manual - diaphragm release bil for improved rib mobility and decreased tension at pelvis  Fascial release at abdominal quadrants for decreased tension at pelvic floor and bladder Single knee to chest 3x30s Butterfly stretch 3x30s Ant/post quad rocking with alt hip IR/ER x10 Seated on blue yoga ball pelvic circles x10 rt/lt, pelvic tilts x10 Lumbar roll outs x10 Rt/Lt/Ct     05/10/24: X5 lumbar rotation X10 windshield wipers Single knee to chest 2x30s Hamstring stretch with strap 3x30s  each Unilateral happy baby 2x30s Seated happy baby 2x30s Ball sitting with x10 diaphragmatic breathing with pelvic drops, x10 pelvic circles each way, ant/post pelvic tilts x10  Reviewed diaphragmatic breathing with voiding bowels and bladder to decreased pain with emptying, mechanics  PATIENT EDUCATION:  Education details: relevant anatomy, exam findings, expectations of PT, chemo, radiation Person educated: Patient Education method: Explanation, Demonstration, Tactile cues, Verbal cues, and Handouts Education comprehension: verbalized understanding, returned demonstration, verbal cues required, tactile cues required, and needs further education  HOME EXERCISE PROGRAM: Access Code: [email or web address] URL: [email or web address] Date: 04/17/2024 Prepared by: Darryle  Exercises - Walk for 30 mins total a day as able  - 1 x daily - 7 x weekly - Sidelying Thoracic Rotation with Open Book  - 1 x daily - 7 x weekly - 1 sets - 10 reps - Supine Hip Internal and External Rotation  - 1 x daily - 7 x weekly - 1 sets - 10 reps - Single Knee to Chest Stretch  - 1 x daily - 7 x weekly - 1 sets - 3 reps - 30s holds - Supported Teacher, music with Pelvic Floor Relaxation  - 1 x daily - 7 x weekly - 1 sets - 3 reps - 30s holds - Unilateral Supported Supine Butterfly Stretch  - 1 x daily - 7 x weekly - 1 sets - 3 reps - 30s holds  Patient Education - Bowel Emptying Techniques - Abdominal Massage for Constipation  ASSESSMENT:  CLINICAL IMPRESSION: Patient was seen today for treatment of anal cancer rehab. Patient tolerated session well without increased pain but does demonstrate continued tension with hips and reports tightness in pelvic floor but improving overall and decreased symptoms, does still have fecal and urinary incontinence and pain with emptying. Pt declined  internal manual in clinic but reports she will continue dilator at home this week. Pt tolerated implementing strengthening well. Patient is progressing well towards goals and will benefit from continued PT to address deficits, reduce pain with bowel movements and bladder voids and improve quality of life.       Last session Patient is a 68 y.o. F who was seen today for physical therapy  treatment for anal cancer. Pt present for follow up with currently no longer having cancer treatments. Has completed radiation and needed to stop chemo about 3/4 of treatment due to reaction. Pt reports increased urgency, frequency of bowels with incontinence, pain with bowel and bladder voids, and continued weakness and balance deficits from evaluation. Pt reports she has been doing walking program daily but hasn't been doing HEP much due to difficulty  on her knees, HEP updated today to remove these exercises. All HEP exercises completed during session today and pt demonstrated good technique with minimal cues and denied pain but does endorse tightness felt. Pt cued throughout for relaxation, decreased gripping in abdomen and glutes, and for dropping shoulders. Pt would benefit from additional PT to further address deficits.    OBJECTIVE IMPAIRMENTS: Abnormal gait, decreased activity tolerance, decreased balance, decreased coordination, decreased endurance, difficulty walking, decreased strength, and impaired tone.   ACTIVITY LIMITATIONS: squatting and caring for others  PARTICIPATION LIMITATIONS: community activity and yard work  PERSONAL FACTORS: 1 comorbidity: anal cancer are also affecting patient's functional outcome.   REHAB POTENTIAL: Good  CLINICAL DECISION MAKING: Stable/uncomplicated  EVALUATION COMPLEXITY: Low   GOALS: Goals reviewed with patient? Yes  SHORT TERM GOALS: Target date: 4 weeks after her treatment ends    Pt will be independent with HEP.   Baseline: Goal status: INITIAL   LONG  TERM GOALS: Target date: 04/20/2024 updated 08/01/2024    Pt will be independent with advanced HEP.   Baseline:  Goal status: progressing  2.  Pt will be able to participate in at least 45 mins exercise session without increased fatigue Baseline:  Goal status: INITIAL  3.  Pt will dem at least 12 sec for 5xSTS to demonstrate good hip strength to help with ease of transfers in and out of the car , bed and chair Baseline:  Goal status: INITIAL  4.  Pt will dem good balance to reduce risk of fall Baseline:  Goal status: progressing  5.  Pt will have 0 pelvic pain, be able to contract/ relax and bulge her pelvic floor with good strength and coordination Baseline:  Goal status: INITIAL   PLAN:  PT FREQUENCY: 1-2x/week  PT DURATION: 12 weeks  PLANNED INTERVENTIONS: 97110-Therapeutic exercises, 97530- Therapeutic activity, 97112- Neuromuscular re-education, 97535- Self Care, [id number]- Manual therapy, 97032- Electrical stimulation (manual), Taping, Dry Needling, Joint mobilization, Joint manipulation, Spinal manipulation, Spinal mobilization, Manual lymph drainage, Compression bandaging, Cryotherapy, Moist heat, and Biofeedback  PLAN FOR NEXT SESSION: re- eval, internal pelvic floor muscle assessment, develop HEP, strengthening post tx  Darryle Navy, PT, DPT 05/25/2511:10 PM  [facility] [place] [address], [address] [place], [place] [address] Phone # [phone] Fax [phone]

## 2024-05-25 ENCOUNTER — Other Ambulatory Visit: Payer: Self-pay

## 2024-05-31 ENCOUNTER — Ambulatory Visit: Admitting: Physical Therapy

## 2024-05-31 DIAGNOSIS — R293 Abnormal posture: Secondary | ICD-10-CM

## 2024-05-31 DIAGNOSIS — M62838 Other muscle spasm: Secondary | ICD-10-CM

## 2024-05-31 DIAGNOSIS — M6281 Muscle weakness (generalized): Secondary | ICD-10-CM

## 2024-05-31 NOTE — Therapy (Signed)
 OUTPATIENT PHYSICAL THERAPY FEMALE PELVIC TREATMENT   Patient Name: Andrea [name] MRN: [id number] DOB:[birth year], 68 y.o., female Today's Date: 05/31/2024  END OF SESSION:  PT End of Session - 05/31/24 1107     Visit Number 9    Date for PT Re-Evaluation 08/01/24    Authorization Type Medicare    Progress Note Due on Visit 10    PT Start Time 1102    PT Stop Time 1142    PT Time Calculation (min) 40 min    Activity Tolerance Patient tolerated treatment well    Behavior During Therapy WFL for tasks assessed/performed           Past Medical History:  Diagnosis Date   Anemia    Anginal pain (HCC)    Arthritis    Chest discomfort    Disorder of immune system (HCC)    Dyspnea    Dyspnea on exertion    Hypercholesterolemia    Hypertension    Migraine    Non-ulcer dyspepsia    Osteopenia of spine    Peptic ulcer disease    Pneumonia    Polyp of rectum    cancerous   Pre-diabetes    Skin cancer    Ulcerative colitis (HCC)    Past Surgical History:  Procedure Laterality Date   ANKLE FRACTURE SURGERY Left    CARDIAC CATHETERIZATION     COLONOSCOPY     ESOPHAGOGASTRODUODENOSCOPY     hip replacement Bilateral    LEFT HEART CATH AND CORONARY ANGIOGRAPHY N/A 01/21/2022   Procedure: LEFT HEART CATH AND CORONARY ANGIOGRAPHY;  Surgeon: Verlin Lonni JONETTA, MD;  Location: [facility];  Service: Cardiovascular;  Laterality: N/A;   LEFT HEART CATH AND CORONARY ANGIOGRAPHY N/A 02/03/2024   Procedure: LEFT HEART CATH AND CORONARY ANGIOGRAPHY;  Surgeon: Darron Deatrice LABOR, MD;  Location: [facility];  Service: Cardiovascular;  Laterality: N/A;   RECTAL BIOPSY N/A 05/04/2022   Procedure: BIOPSY RECTAL VS EXCISION OF PERIANAL LESION UNDER ANOSCOPY;  Surgeon: Teresa Lonni HERO, MD;  Location: [facility];  Service: General;  Laterality: N/A;   TOTAL KNEE ARTHROPLASTY Left 10/19/2022   Procedure: LEFT TOTAL KNEE ARTHROPLASTY;  Surgeon: Jerri Kay HERO, MD;  Location: [facility];  Service: Orthopedics;  Laterality: Left;   TUBAL LIGATION     TUMOR EXCISION N/A 01/14/2024   Procedure: TRANSRECTAL TUMOR EXCISION;  Surgeon: Teresa Lonni HERO, MD;  Location: [facility];  Service: General;  Laterality: N/A;  Transanal excision of distal rectal and anal lesion (wide local)   Patient Active Problem List   Diagnosis Date Noted   Anal cancer (HCC) 02/28/2024   Coronary vasospasm (HCC) 02/28/2024   Myopericarditis 02/04/2024   Primary hypertension 02/04/2024   Non-ST elevation (NSTEMI) myocardial infarction (HCC) 02/03/2024   Chest pain 02/03/2024   Pleuritic chest pain 02/02/2024   Coronary artery disease due to lipid rich plaque 02/02/2024   Aortic atherosclerosis (HCC) 02/02/2024   Port-A-Cath in place 01/31/2024   Anal squamous cell carcinoma (HCC) 01/20/2024   AIN III (anal intraepithelial neoplasia III) 11/09/2023   Anal irritation 11/09/2023   History of anal cancer 11/09/2023   Status post total left knee replacement 10/19/2022   Primary osteoarthritis of left knee 09/30/2022   Chest pain of uncertain etiology    Ulcerative colitis (HCC) 07/06/2019   PUD (peptic ulcer disease) 07/06/2019    PCP: Marvene Prentice SAUNDERS, FNP  REFERRING PROVIDER:  Lanny Callander, MD  REFERRING DIAG:  C21.0 (ICD-10-CM) - Anal cancer (HCC)  THERAPY DIAG:  Other muscle spasm  Abnormal posture  Muscle weakness (generalized)  Rationale for Evaluation and Treatment: Rehabilitation  ONSET DATE: 12/2023  SUBJECTIVE:                                                                                                                                                                                           SUBJECTIVE STATEMENT: Pt reports she had lasagna and had very sudden looser bowel movements, with incontinence yesterday, did have accidents with this but has had 3 outside of this as well and moderate amounts with urgency.  Accidents are type 7, usually bowel movements  are 5-6. Bowel movements still painful though improving.   Fluid intake: water - 1 quart, coffee in morning 1 cups, tea daily 1x  PAIN: no 9/10 PAIN:  Are you having pain? Yes NPRS scale: 7/10 Pain location: rectum  Pain orientation: Other: rectal   PAIN TYPE: burning and sharp Pain description: constant  Aggravating factors: passing stool, goes away a few mins after emptying Relieving factors: just finishing bowel movement    PRECAUTIONS: None  RED FLAGS: None   WEIGHT BEARING RESTRICTIONS: No  FALLS:  Has patient fallen in last 6 months? No  OCCUPATION: retired  ACTIVITY LEVEL : not active in the last 3 weeks  PLOF: Independent  PATIENT GOALS: does not know  PERTINENT HISTORY:  ANKLE FRACTURE SURGERY Left     CARDIAC CATHETERIZATION      COLONOSCOPY      ESOPHAGOGASTRODUODENOSCOPY      hip replacement Bilateral     LEFT HEART CATH AND CORONARY ANGIOGRAPHY N/A 01/21/2022 Procedure: LEFT HEART CATH AND CORONARY ANGIOGRAPHY;  Surgeon: Verlin Lonni BIRCH, MD;  Location: [facility];  Service: Cardiovascular;  Laterality: N/A;   RECTAL BIOPSY N/A 05/04/2022 Procedure: BIOPSY RECTAL VS EXCISION OF PERIANAL LESION UNDER ANOSCOPY;  Surgeon: Teresa Lonni HERO, MD;  Location: [facility];  Service: General;  Laterality: N/A;   TOTAL KNEE ARTHROPLASTY Left 10/19/2022 Procedure: LEFT TOTAL KNEE ARTHROPLASTY;  Surgeon: Jerri Kay HERO, MD;  Location: [facility];  Service: Orthopedics;  Laterality: Left;   TUBAL LIGATION      TUMOR EXCISION        Sexual abuse: No  BOWEL MOVEMENT: Pain with bowel movement: Yes Type of bowel movement:Type (Bristol Stool Scale) 5-6, Frequency 2-3x daily, and Strain yes Fully empty rectum: No Leakage: Yes: very short warning and will loss full bowels if can't make it quickly enough  Pads: Yes: 3-4x daily, no longer wearing at night  Fiber supplement/laxative No  URINATION: Pain with urination:  yes - at start of treatments was exteremly  painful but now a little  Fully empty bladder: Yes:  Stream: Strong Urgency: Yes  Frequency: every 2 hours, usually 3-4x nightly  Leakage: none Pads: No  INTERCOURSE: not active   PREGNANCY: Vaginal deliveries 3 Tearing Yes: big babies PROLAPSE: None   OBJECTIVE:  Note: Objective measures were completed at Evaluation unless otherwise noted.  DIAGNOSTIC FINDINGS:  Anal cancer  PATIENT SURVEYS:    PFIQ-7: 0 04/10/24: 179  COGNITION: Overall cognitive status: Within functional limits for tasks assessed     SENSATION: Light touch: Appears intact  LUMBAR SPECIAL TESTS:  Straight leg raise test: Negative  FUNCTIONAL TESTS:  5 times sit to stand: 13 seconds  GAIT: Assistive device utilized: None Comments: antalgic  POSTURE: rounded shoulders and forward head   LUMBARAROM/PROM: full   LOWER EXTREMITY ROM: full  LOWER EXTREMITY MMT: right hip 4-/5 flexion, left hip 4/5  PALPATION:   General: upper chest breathing, no tightness bilateral hip scars, low abdominal tone  Pelvic Alignment: even  Abdominal: abdominal doming supra umbilical, low abdominal tone                 External Perineal Exam: mild dryness noticed                             Internal Pelvic Floor: tight and tender throughout  Patient confirms identification and approves PT to assess internal pelvic floor and treatment yes Patient confirms identification and approves PT to assess internal pelvic floor and treatment Yes No emotional/communication barriers or cognitive limitation. Patient is motivated to learn. Patient understands and agrees with treatment goals and plan. PT explains patient will be examined in standing, sitting, and lying down to see how their muscles and joints work. When they are ready, they will be asked to remove their underwear so PT can examine their perineum. The patient is also given the option of providing their own chaperone as one is not provided in our facility. The  patient also has the right and is explained the right to defer or refuse any part of the evaluation or treatment including the internal exam. With the patient's consent, PT will use one gloved finger to gently assess the muscles of the pelvic floor, seeing how well it contracts and relaxes and if there is muscle symmetry. After, the patient will get dressed and PT and patient will discuss exam findings and plan of care. PT and patient discuss plan of care, schedule, attendance policy and HEP activities. - 04/24/2024     PELVIC MMT:   MMT eval  Vaginal 3/5  Internal Anal Sphincter   External Anal Sphincter   Puborectalis   Diastasis Recti 1 finger at and under umbilicus, doming above umbilicus  (Blank rows = not tested)        TONE: high  PROLAPSE: None noticed in hooklying  TODAY'S TREATMENT:  DATE:   05/31/24: Pt consented to internal pelvic floor assessment and treatment rectally today. Found to have restrictions in tissue mobility in all directions but worse at lt and most painful at Lt though bil. Initially manual to external pelvic floor for improved tolerance to internal, did have external tension at bil superficial transverse perineal muscles, perineal body, and bil obturators. Moderate release here. With pt consent, internal manual progressed. Gentle light manual in all directions with cues for relaxation and diaphragmatic breathing, improved effect. Pt did have 5/10 pain with palpation but did not increase and by end 3/10.  Vibration plate [id number] in standing, low setting cues for pelvic drops for improved tissue mobility and relaxation    05/24/24: Lumbar roll outs x10 Rt/Lt/Ct  Seated on ball pelvic circles x10 each, hip shift 3x30s each, pelvic drops with diaphragmatic breathing x10 Standing L with hip IR rotation 3x30s Elevated lunge with trunk rotation  3x30s each Modified cat/cow x10 with moderate cues for techniques  Wind shield wipers x15 each Single knee to chest 3x30s 2x10 bridges  Hooklying blue band hip abduction 2x10  05/18/24: Manual - diaphragm release bil for improved rib mobility and decreased tension at pelvis  Fascial release at abdominal quadrants for decreased tension at pelvic floor and bladder Single knee to chest 3x30s Butterfly stretch 3x30s Ant/post quad rocking with alt hip IR/ER x10 Seated on blue yoga ball pelvic circles x10 rt/lt, pelvic tilts x10 Lumbar roll outs x10 Rt/Lt/Ct    05/10/24: X5 lumbar rotation X10 windshield wipers Single knee to chest 2x30s Hamstring stretch with strap 3x30s  each Unilateral happy baby 2x30s Seated happy baby 2x30s Ball sitting with x10 diaphragmatic breathing with pelvic drops, x10 pelvic circles each way, ant/post pelvic tilts x10  Reviewed diaphragmatic breathing with voiding bowels and bladder to decreased pain with emptying, mechanics  PATIENT EDUCATION:  Education details: relevant anatomy, exam findings, expectations of PT, chemo, radiation Person educated: Patient Education method: Explanation, Demonstration, Tactile cues, Verbal cues, and Handouts Education comprehension: verbalized understanding, returned demonstration, verbal cues required, tactile cues required, and needs further education  HOME EXERCISE PROGRAM: Access Code: [email or web address] URL: [email or web address] Date: 04/17/2024 Prepared by: Darryle  Exercises - Walk for 30 mins total a day as able  - 1 x daily - 7 x weekly - Sidelying Thoracic Rotation with Open Book  - 1 x daily - 7 x weekly - 1 sets - 10 reps - Supine Hip Internal and External Rotation  - 1 x daily - 7 x weekly - 1 sets - 10 reps - Single Knee to Chest Stretch  - 1 x daily - 7 x weekly - 1 sets - 3 reps - 30s holds - Supported Teacher, music with Pelvic Floor Relaxation  - 1 x daily - 7 x weekly - 1 sets - 3 reps - 30s  holds - Unilateral Supported Supine Butterfly Stretch  - 1 x daily - 7 x weekly - 1 sets - 3 reps - 30s holds  Patient Education - Bowel Emptying Techniques - Abdominal Massage for Constipation  ASSESSMENT:  CLINICAL IMPRESSION: Patient was seen today for treatment of anal cancer rehab.pt continues to have pain and impaired bowel habits. Pt consented to internal pelvic floor treatment today and does have tension and TTP at rectal pelvic floor. Manual tolerated well with NMRE for cues of relaxation and coordination with breathing. Pt tolerated well without increase in pain and did have lowered pain at end of session. Responded well with reports of  feeling less pain and tension with vibration plate. Patient is progressing well towards goals and will benefit from continued PT to address deficits, reduce pain with bowel movements and bladder voids and improve quality of life.       OBJECTIVE IMPAIRMENTS: Abnormal gait, decreased activity tolerance, decreased balance, decreased coordination, decreased endurance, difficulty walking, decreased strength, and impaired tone.   ACTIVITY LIMITATIONS: squatting and caring for others  PARTICIPATION LIMITATIONS: community activity and yard work  PERSONAL FACTORS: 1 comorbidity: anal cancer are also affecting patient's functional outcome.   REHAB POTENTIAL: Good  CLINICAL DECISION MAKING: Stable/uncomplicated  EVALUATION COMPLEXITY: Low   GOALS: Goals reviewed with patient? Yes  SHORT TERM GOALS: Target date: 4 weeks after her treatment ends    Pt will be independent with HEP.   Baseline: Goal status: INITIAL   LONG TERM GOALS: Target date: 04/20/2024 updated 08/01/2024    Pt will be independent with advanced HEP.   Baseline:  Goal status: progressing  2.  Pt will be able to participate in at least 45 mins exercise session without increased fatigue Baseline:  Goal status: INITIAL  3.  Pt will dem at least 12 sec for 5xSTS to  demonstrate good hip strength to help with ease of transfers in and out of the car , bed and chair Baseline:  Goal status: INITIAL  4.  Pt will dem good balance to reduce risk of fall Baseline:  Goal status: progressing  5.  Pt will have 0 pelvic pain, be able to contract/ relax and bulge her pelvic floor with good strength and coordination Baseline:  Goal status: INITIAL   PLAN:  PT FREQUENCY: 1-2x/week  PT DURATION: 12 weeks  PLANNED INTERVENTIONS: 97110-Therapeutic exercises, 97530- Therapeutic activity, 97112- Neuromuscular re-education, 97535- Self Care, [id number]- Manual therapy, 97032- Electrical stimulation (manual), Taping, Dry Needling, Joint mobilization, Joint manipulation, Spinal manipulation, Spinal mobilization, Manual lymph drainage, Compression bandaging, Cryotherapy, Moist heat, and Biofeedback  PLAN FOR NEXT SESSION: re- eval, internal pelvic floor muscle assessment, develop HEP, strengthening post tx  Darryle Navy, PT, DPT 05/31/2510:44 AM  [facility] [address] [place], [place] [address] Phone # [phone] Fax [phone]

## 2024-06-07 ENCOUNTER — Encounter: Admitting: Physical Therapy

## 2024-06-12 ENCOUNTER — Ambulatory Visit (HOSPITAL_COMMUNITY)
Admission: RE | Admit: 2024-06-12 | Discharge: 2024-06-12 | Disposition: A | Discharge status: Home / Self Care | Source: Ambulatory Visit | Attending: Hematology | Admitting: Hematology

## 2024-06-12 DIAGNOSIS — I700 Atherosclerosis of aorta: Secondary | ICD-10-CM | POA: Diagnosis not present

## 2024-06-12 DIAGNOSIS — R590 Localized enlarged lymph nodes: Secondary | ICD-10-CM | POA: Diagnosis not present

## 2024-06-12 DIAGNOSIS — C210 Malignant neoplasm of anus, unspecified: Secondary | ICD-10-CM | POA: Diagnosis present

## 2024-06-12 LAB — GLUCOSE, CAPILLARY: Glucose-Capillary: 105 mg/dL — ABNORMAL HIGH (ref 70–99)

## 2024-06-12 MED ORDER — FLUDEOXYGLUCOSE F - 18 (FDG) INJECTION
9.0000 | Freq: Once | INTRAVENOUS | Status: AC | PRN
Start: 2024-06-12 — End: 2024-06-12
  Administered 2024-06-12: 9 via INTRAVENOUS

## 2024-06-15 ENCOUNTER — Inpatient Hospital Stay: Attending: Hematology

## 2024-06-15 DIAGNOSIS — C210 Malignant neoplasm of anus, unspecified: Secondary | ICD-10-CM | POA: Diagnosis present

## 2024-06-15 LAB — CMP (CANCER CENTER ONLY)
ALT: 11 U/L (ref 0–44)
AST: 18 U/L (ref 15–41)
Albumin: 3.9 g/dL (ref 3.5–5.0)
Alkaline Phosphatase: 68 U/L (ref 38–126)
Anion gap: 3 — ABNORMAL LOW (ref 5–15)
BUN: 17 mg/dL (ref 8–23)
CO2: 26 mmol/L (ref 22–32)
Calcium: 9.2 mg/dL (ref 8.9–10.3)
Chloride: 111 mmol/L (ref 98–111)
Creatinine: 0.85 mg/dL (ref 0.44–1.00)
GFR, Estimated: >60 mL/min (ref >60)
Glucose, Bld: 109 mg/dL — ABNORMAL HIGH (ref 70–99)
Potassium: 4 mmol/L (ref 3.5–5.1)
Sodium: 140 mmol/L (ref 135–145)
Total Bilirubin: 0.4 mg/dL (ref 0.0–1.2)
Total Protein: 6.5 g/dL (ref 6.5–8.1)

## 2024-06-15 LAB — CBC WITH DIFFERENTIAL (CANCER CENTER ONLY)
Abs Immature Granulocytes: 0 K/uL (ref 0.00–0.07)
Basophils Absolute: 0 K/uL (ref 0.0–0.1)
Basophils Relative: 1 %
Eosinophils Absolute: 0.1 K/uL (ref 0.0–0.5)
Eosinophils Relative: 2 %
HCT: 30.8 % — ABNORMAL LOW (ref 36.0–46.0)
Hemoglobin: 10.3 g/dL — ABNORMAL LOW (ref 12.0–15.0)
Immature Granulocytes: 0 %
Lymphocytes Relative: 21 %
Lymphs Abs: 0.7 K/uL (ref 0.7–4.0)
MCH: 30.2 pg (ref 26.0–34.0)
MCHC: 33.4 g/dL (ref 30.0–36.0)
MCV: 90.3 fL (ref 80.0–100.0)
Monocytes Absolute: 0.5 K/uL (ref 0.1–1.0)
Monocytes Relative: 13 %
Neutro Abs: 2.2 K/uL (ref 1.7–7.7)
Neutrophils Relative %: 63 %
Platelet Count: 180 K/uL (ref 150–400)
RBC: 3.41 MIL/uL — ABNORMAL LOW (ref 3.87–5.11)
RDW: 13.2 % (ref 11.5–15.5)
WBC Count: 3.5 K/uL — ABNORMAL LOW (ref 4.0–10.5)
nRBC: 0 % (ref 0.0–0.2)

## 2024-06-15 LAB — CEA (ACCESS): CEA (CHCC): 2.57 ng/mL (ref 0.00–5.00)

## 2024-06-20 NOTE — Progress Notes (Signed)
 PROVIDER:  LONNI OZELL PIZZA, MD  MRN: [id number] DOB: Sep 12, 1956 DATE OF ENCOUNTER: 06/20/2024  Subjective   Chief Complaint: Anal cancer, follow-up  History of Present Illness: Andrea [name] is a 68 y.o. female with history of UC (follows with Dr. Legrand) - she was referred to see us  for some anal irritation/white like skin changes noted on examination in the office with them back 11/2021.  She was first diagnosed with ulcerative colitis back in March 2005.  Colonoscopy 06/2020 showed a small 4 mm rectal hyperplastic polyp and sigmoid diverticulosis.  She was noted to be stable on infliximab  as well as her low-dose azathioprine.  She was seen in their office back in March with a several week history of perianal irritation when she has bowel movements.  She was taking a stool softener at that time.  She describes an itching/burning sensation.  She has tried a variety of over-the-counter topical ointments including Neosporin, Vaseline, Preparation H, Aquaphor.  She reports that she first noticed all the symptoms somewhere back in December/January and now having had them for 5 to 6 months.  They have not gotten better nor worse.  Nothing seems to be progressive at this juncture.  She denies any issue with incontinence to gas, liquid, or solid stool.  OR 05/04/22 - Transanal excision of posterior midline anal canal lesion under endoscopic guidance Punch biopsy x2 of perianal skin, left lateral Anorectal exam under anesthesia  OR FINDINGS: Posterior midline white plaque anal lesion 1 x 1 cm in size, visualized under anoscopy - fully excised. Very sharp margin area of redness at junction of the anoderm and anal margin. This was punch biopsied x 2 including portion of normal skin, submitted as separate specimen from posterior midline tissue. Small internal hemorrhoids.  PATH: A. ANAL LESION, POSTERIOR MIDLINE, EXCISION:  -  Superficially invasive squamous cell carcinoma in the background of   extensive high-grade squamous intraepithelial lesion/anal  intraepithelial neoplasia 3 of 3 (HGSIL/AIN 3)   B. SKIN, ANAL MARGIN, LEFT LATERAL, BIOPSY:  -  High-grade squamous intraepithelial lesion/anal intraepithelial  neoplasia 3 of 3 (HGSIL/AIN 3).  Skin lesion/skin cancer removed from her chest.  She met back with her gastroenterologist in [facility].  She indicates that they believe this could be related to side effects of Remicade  and or changing her medication regimen.  No other complaints at present.  No lumps bumps or issues with the perianal area.  She did complete treatment with Aldara.  Cscope 01/21/23: - Normal examined ileum - Normal mucosa in entire examined colon - 2 diminutive polyps were removed - Diverticulosis in the entire examined colon - External and internal hemorrhoids - Exam otherwise normal - Several random biopsies were obtained - Colitis remains in complete mucosal remission  PATH -Tubular adenoma without dysplasia; all random biopsies demonstrate mucosa without diagnostic alteration   Initially lost for follow-up but returned for follow-up.  She reports she has had for at least the last 3 - 4 months some perianal itching and occasional tiny drops of blood when wiping.  She has follow-up with GI whom had instructed her to return here to be seen given her history.  She has been switched from Remicade  to Entyvio .  Otherwise, she denies any changes in her health or health history.  She is not currently taking any blood thinners.  She denies any history of heart attack or stroke.   OR 01/14/24 -perianal lesion punch biopsy x 4, EUA   OR FINDINGS: Posteriorly based anal  lesion, friable, glistening/pink (see photo), sharp demarcation. Extends into anal canal to the level of dentate. 2 firm areas - right posterior and left posterior - punch biopsies obtained to rule out underlying squamous cell carcinoma.   Path: A. PERIANAL, RIGHT POSTERIOR, BIOPSY:        Invasive squamous cell carcinoma, moderately differentiated.       Suspicious for lymphovascular invasion.   B. PERIANAL, LEFT POSTERIOR, BIOPSY:       High-grade squamous intraepithelial lesion (HSIL / AIN 3).       No evidence of definitive invasion.   CT PET 01/25/24 -  1. Long segment anorectal hypermetabolic activity consistent with known primary malignancy. 2. Hypermetabolic right inguinal and external iliac lymph nodes consistent with nodal metastases. 3. No evidence of distant metastatic disease. 4.  Aortic Atherosclerosis (ICD10-I70.0).  Case presented at [facility].   Consensus recommendation was for chemoradiation therapy.  Had PICC line placed and began therapy.  She subsequent developed Myo versus pericarditis.  She had presented to hospital for this 02/02/2024.  She was discharged 02/04/2024.  Got over pericarditis - has been off the infusional chemo but continued her XRT. Started 01/31/24 with planned 5.5 wk course. No new perianal pains or bleeding. Has recovered well from punch biopsy perspective  Completed chemoradiation for anal cancer with approximately 80% of the planned chemotherapy dose due to coronary artery spasm.  Started pelvic floor PT.  INTERVAL HX Restaging PET ordered with Dr. Lanny for 06/12/24 with office visit 06/2024.  PET 06/12/24 1. Interval resolution of previously demonstrated anorectal hypermetabolic activity consistent with response to therapy. 2. Interval resolution of previously demonstrated hypermetabolic right inguinal and right pelvic lymph nodes. 3. Mildly progressive hypermetabolic retroperitoneal adenopathy in the low abdomen and false pelvis, suspicious for metastatic disease. New small left supraclavicular lymph node with low level metabolic activity, indeterminate. 4. No evidence of extranodal metastatic disease. 5.  Aortic Atherosclerosis (ICD10-I70.0).  She sees me today in follow-up with her son and daughter-in-law. Other son present on  speaker phone.  She has been doing reasonably well.  She still has some pain with defecation although this is gradually improving after completing radiation 03/16/24.  Continues to improve today.  She has been using some suppositories which have helped.  Denies any evident masses or lumps.  No tissue prolapse.  Does report some degree of fecal incontinence with accidents throughout the day and will wear a diaper.  She is seeing pelvic floor physical therapy.  She is not currently taking any stool softeners, laxatives, nor antidiarrheals.    PMH: UC (on Entyvio  now with Dr. Legrand)  PSH: As above, otherwise, denies any prior anorectal procedures or surgeries  FHx: Denies any known family history of colorectal, breast, endometrial or ovarian cancer  Social Hx: Denies use of tobacco/EtOH/illicit drug.  She is happily retired.  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  Helps care for a special needs grandchild who weighs approximately 40 pounds   Medical History: Past Medical History:  Diagnosis Date  . Blood in urine   . Chest discomfort    history of   . Chest pressure   . Hyperlipidemia   . Hypertension   . Iron deficiency    borderline   . Nervous stomach    history of   . Nocturia   . ULCERATIVE COLITIS   . Ulcerative colitis (CMS/HHS-HCC)   . Unspecified vitamin D deficiency   . Weight gain     Patient Active Problem  List  Diagnosis  . Nervous stomach  . Nocturia  . Essential hypertension  . Vitamin D deficiency  . Hyperlipidemia  . Blood in urine  . Ulcerative colitis (CMS/HHS-HCC)  . Weight loss  . Seasonal allergies  . Encounter for long-term (current) use of medications  . Benign breast lumps  . Acute anxiety  . Iron deficiency anemia  . Heartburn  . H. pylori infection  . Elevated fasting glucose  . CRI (chronic renal insufficiency), unspecified stage  . Hypercholesterolemia  . Pain of both hip joints  . Seasonal allergic rhinitis  .  Incomplete tear of rotator cuff  . Oral thrush    Past Surgical History:  Procedure Laterality Date  . JOINT REPLACEMENT Right 03/2016   hip replacement  . 3 children through vaginal deliveries and one miscarriage    . 4/12 patient fall trauma to right lateral chest wall, right knee with swelling    . narrow oropharynx    . OTHER SURGERY     Hip replacement  . status post bilateral tubal ligation       Allergies  Allergen Reactions  . Codeine Vomiting    Current Outpatient Medications on File Prior to Visit  Medication Sig Dispense Refill  . acetaminophen  (TYLENOL ) 500 MG tablet Take 1,000 mg by mouth 3 (three) times daily as needed for Pain.    . atorvastatin  (LIPITOR) 10 MG tablet TAKE 1 TABLET BY MOUTH ONCE DAILY 30 tablet 0  . azaTHIOprine (IMURAN) 50 mg tablet TAKE 2 TABLETS BY MOUTH EVERY DAY 180 tablet 3  . calcium  citrate-vitamin D3 250-200 mg-unit Tab Take 1 tablet by mouth once daily.     . cholecalciferol (VITAMIN D3) 2,000 unit tablet Take 4,000 Units by mouth once daily.     SABRA co-enzyme Q-10, ubiquinone, 200 mg capsule Take 200 mg by mouth once daily.    . Herbal Supplement 1 tablet once daily. Turmeric + black pepper      . inFLIXimab  (REMICADE ) 100 mg injection REMICADE  IV EVERY 8 WEEKS    . lidocaine  (LIDOCAINE ) 2 % solution 5 ml gargle and spit q 6 hours prn for mouth soreness 150 mL 0  . MV,CA,MIN/IRON/FA/GUARANA/CAFF (ONE-A-DAY WOMEN'S ACTIVE ORAL) Take 1 tablet by mouth once daily.    . PARoxetine  (PAXIL ) 20 MG tablet TAKE 1 TABLET(20 MG) BY MOUTH EVERY DAY 30 tablet 5  . predniSONE  (DELTASONE ) 10 MG tablet 30 mg po qam x 5days          20 mg po qam x 5days          10 mg po qam x10 days (Patient not taking: Reported on 09/22/2016) 35 tablet 0  . triamterene -hydrochlorothiazide  (DYAZIDE ) 37.5-25 mg capsule TAKE 1 CAPSULE BY MOUTH ONCE DAILY 30 capsule 0   No current facility-administered medications on file prior to visit.    Family History  Problem  Relation Age of Onset  . Coronary Artery Disease (Blocked arteries around heart) Mother   . Heart disease Mother   . Coronary Artery Disease (Blocked arteries around heart) Brother   . Heart disease Brother      Social History   Tobacco Use  Smoking Status Never  Smokeless Tobacco Never     Social History   Socioeconomic History  . Marital status: Divorced  Tobacco Use  . Smoking status: Never  . Smokeless tobacco: Never  Vaping Use  . Vaping status: Unknown  Substance and Sexual Activity  . Alcohol use: No  .  Drug use: No  . Sexual activity: Not Currently   Social Drivers of Health   Food Insecurity: No Food Insecurity (03/21/2024)   Received from [facility]   Hunger Vital Sign   . Within the past 12 months, you worried that your food would run out before you got the money to buy more.: Never true   . Within the past 12 months, the food you bought just didn't last and you didn't have money to get more.: Never true  Transportation Needs: No Transportation Needs (03/21/2024)   Received from [facility] - Transportation   . In the past 12 months, has lack of transportation kept you from medical appointments or from getting medications?: No   . In the past 12 months, has lack of transportation kept you from meetings, work, or from getting things needed for daily living?: No  Social Connections: Moderately Integrated (02/28/2024)   Received from [facility]   Social Connection and Isolation Panel   . In a typical week, how many times do you talk on the phone with family, friends, or neighbors?: More than three times a week   . How often do you get together with friends or relatives?: More than three times a week   . How often do you attend church or religious services?: More than 4 times per year   . Do you belong to any clubs or organizations such as church groups, unions, fraternal or athletic groups, or school groups?: Yes   . How often do you attend meetings of the  clubs or organizations you belong to?: More than 4 times per year   . Are you married, widowed, divorced, separated, never married, or living with a partner?: Divorced  Housing Stability: Unknown (12/21/2023)   Housing Stability Vital Sign   . Homeless in the Last Year: No    Objective:    Vitals:   06/20/24 1125  Pulse: 99  Resp: 18  Temp: 36.8 C (98.2 F)  SpO2: 98%  Weight: 73.8 kg (162 lb 9.6 oz)  Height: 165.1 cm (5' 5)  PainSc: 0-No pain     Body mass index is 27.06 kg/m.  Constitutional: NAD; conversant Eyes: Moist conjunctiva; anicteric Lungs: Normal respiratory effort CV: RRR Anorectal: Posteriorly, completely closed skin, no open wounds, mass or ulceration in anal margin.  DRE-normal tone; no significant stricturing but is still sensitive.  Smooth anal canal.  No palpable masses. Anoscopy: Circumferential anoscopy demonstrates healthy appearing anoderm without any visible masses or ulcers. MSK: Normal range of motion of extremities Psychiatric: Appropriate affect; alert and oriented x3  Procedure: Anoscopy We spent time reviewing the procedure, material risks (including but not limited to perianal pain, bleeding) benefits and alternatives.  Witnessed, informed consent was obtained.  Narrative:  The lubricated anoscope was gently inserted into the anal canal. Circumferential anoscopy demonstrates findings noted above  A chaperone, Chemira Jones CMA, was present for this entire examination  Assessment and Plan:  Diagnoses and all orders for this visit:  Anal cancer (CMS/HHS-HCC)   Andrea [name] is a very pleasant 68 y.o. female with hx of longstanding UC with hx of excised HGSIL perianally but lost to follow-up; developed evident recurrence   OR 01/14/24 - punch biopsy x4 perianal lesion, EUA PATH -invasive squamous cell carcinoma CT PET demonstrates evident nodal metastases in both the inguinal and external iliac chain  [facility]T --> consensus for  cXRT  Pericarditis vs coronary artery spasm presumably 2/2 chemotherapy? - following with  Dr. Lanny  XRT 01/31/24 --> 5.5 wks -received approximately 80% of the prescribed chemotherapy secondary to coronary artery spasm - completed 03/16/24  - Doing well - Posterior midline portion of the anal canal and anal margin show no open wounds or ulceration.  Did discuss potentially trialing a tablet of Imodium in the morning plus or minus the evening as well.  Continue with pelvic floor physical therapy.  PET 06/12/24 -resolution of the hypermetabolic activity previously seen in the anal canal.  Resolution of hypermetabolic right inguinal and pelvic lymph nodes. - Mildly progressive hypermetabolic retroperitoneal adenopathy in the lower abdomen and pelvis, suspicious for metastatic disease.  New small left supraclavicular lymph node with low metabolic activity, indeterminate. - She has follow-up established with Dr. Lanny later this week to further discuss and evaluate - did review this with her and her family today.  Reviewed the somewhat indeterminant findings as well as the mildly progressive hypermetabolic retroperitoneal adenopathy.  -Will tentatively plan to see her back for follow-up in 3 months or sooner if any other issues or concerns arise.  All of her and her family's questions were answered to their satisfaction, they expressed understanding, and agreement with the plan.  She has been encouraged to call with any questions or concerns in the interim.   Return in about 3 months (around 09/19/2024).  I spent a total of 45 minutes in both face-to-face and non-face-to-face activities, excluding procedures performed, for this visit on the date of this encounter.   Lonni Pizza, MD [facility], [facility]

## 2024-06-22 ENCOUNTER — Inpatient Hospital Stay: Attending: Hematology | Admitting: Hematology

## 2024-06-22 ENCOUNTER — Other Ambulatory Visit: Payer: Self-pay

## 2024-06-22 ENCOUNTER — Inpatient Hospital Stay

## 2024-06-22 ENCOUNTER — Encounter (HOSPITAL_COMMUNITY): Payer: Self-pay

## 2024-06-22 ENCOUNTER — Telehealth: Payer: Self-pay

## 2024-06-22 VITALS — BP 138/62 | HR 86 | Temp 98.0°F | Resp 16 | Ht 65.0 in | Wt 160.7 lb

## 2024-06-22 DIAGNOSIS — R630 Anorexia: Secondary | ICD-10-CM | POA: Diagnosis not present

## 2024-06-22 DIAGNOSIS — K5190 Ulcerative colitis, unspecified, without complications: Secondary | ICD-10-CM | POA: Insufficient documentation

## 2024-06-22 DIAGNOSIS — F419 Anxiety disorder, unspecified: Secondary | ICD-10-CM | POA: Diagnosis not present

## 2024-06-22 DIAGNOSIS — C778 Secondary and unspecified malignant neoplasm of lymph nodes of multiple regions: Secondary | ICD-10-CM | POA: Insufficient documentation

## 2024-06-22 DIAGNOSIS — C210 Malignant neoplasm of anus, unspecified: Secondary | ICD-10-CM

## 2024-06-22 DIAGNOSIS — Z79899 Other long term (current) drug therapy: Secondary | ICD-10-CM | POA: Diagnosis not present

## 2024-06-22 DIAGNOSIS — R159 Full incontinence of feces: Secondary | ICD-10-CM | POA: Diagnosis not present

## 2024-06-22 DIAGNOSIS — R1909 Other intra-abdominal and pelvic swelling, mass and lump: Secondary | ICD-10-CM

## 2024-06-22 DIAGNOSIS — R634 Abnormal weight loss: Secondary | ICD-10-CM | POA: Insufficient documentation

## 2024-06-22 DIAGNOSIS — D649 Anemia, unspecified: Secondary | ICD-10-CM | POA: Insufficient documentation

## 2024-06-22 LAB — MISCELLANEOUS TEST

## 2024-06-22 NOTE — Assessment & Plan Note (Signed)
 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  -PET Jan 25, 2024 showed long segment anorectal hypermetabolic activity, and hypermetabolic right inguinal and external iliac lymph nodes metastasis.  No distant metastasis. -she started concurrent chemoradiation with 5-FU and mitomycin  on Jan 31, 2024. -she was hospitalized for pericarditis on 02/02/2024, clinically suspicious for 5-fu induced coronary artery spasm. Chemo held on day 4 -She was admitted to hospital for cycle 2 chemotherapy with cardiology on board, she still developed a significant recurrent chest pain and 5-FU infusion was stopped on day 4 -RT completed on 03/16/24 -PET 06/12/2024 showed resolved hypermetabolic activity in the anus and right inguinal and pelvic lymph nodes, however she has mildly progressive hypermetabolic retroperitoneal adenopathy. Will do MRD test and contact IR to see if biopsy is feasible

## 2024-06-22 NOTE — Progress Notes (Signed)
 [facility]   [phone] [phone]   Clinic Follow up Note   Patient Care Team: Marvene Prentice SAUNDERS, FNP as PCP - General (Family Medicine) Shlomo Wilbert SAUNDERS, MD as PCP - Cardiology (Cardiology) Ardis Evalene CROME, RN as Oncology Nurse Navigator Lanny Callander, MD as Consulting Physician (Hematology and Oncology)  Date of Service:  06/22/2024  CHIEF COMPLAINT: f/u of anal cancer   CURRENT THERAPY:  Cancer surveillance  Oncology History   Anal squamous cell carcinoma (HCC) cT2N1M0, stage IIIA -previous history of superficially invasive squamous cell carcinoma of anus, status post surgical resection in August 2023, presented with rectal discomfort for several months, and a palpable 2 to 3 cm mass in the anal canal, biopsy confirmed invasive squamous cell carcinoma in April 2025.  -PET Jan 25, 2024 showed long segment anorectal hypermetabolic activity, and hypermetabolic right inguinal and external iliac lymph nodes metastasis.  No distant metastasis. -she started concurrent chemoradiation with 5-FU and mitomycin  on Jan 31, 2024. -she was hospitalized for pericarditis on 02/02/2024, clinically suspicious for 5-fu induced coronary artery spasm. Chemo held on day 4 -She was admitted to hospital for cycle 2 chemotherapy with cardiology on board, she still developed a significant recurrent chest pain and 5-FU infusion was stopped on day 4 -RT completed on 03/16/24 -PET 06/12/2024 showed resolved hypermetabolic activity in the anus and right inguinal and pelvic lymph nodes, however she has mildly progressive hypermetabolic retroperitoneal adenopathy. Will do MRD test and contact IR to see if biopsy is feasible   Assessment & Plan Anal cancer in remission with suspicious retroperitoneal and left clavicular lymphadenopathy (possible recurrence) Anal cancer is in remission with no visible primary tumor or pelvic lymph nodes on PET scan. Suspicious lymph nodes in the  retroperitoneum and left clavicular area suggest possible recurrence. Differential diagnosis includes cancer recurrence, reactive lymphadenopathy, or ulcerative colitis. The retroperitoneal lymph nodes are difficult to biopsy, while the left clavicular lymph node is small but accessible. - Consult interventional radiology for biopsy feasibility of retroperitoneal and left clavicular lymph nodes. - Order circulating tumor DNA blood test (Oncodetect or Signatera) to assess for minimal residual disease. - Proceed with biopsy if feasible and safe. - Repeat imaging in 2-3 months if biopsy is not feasible. - Discuss potential treatment options if recurrence is confirmed, including chemotherapy, immunotherapy, and possibly radiation if localized. - Schedule blood test for next week and follow up on results in 4-5 weeks.  Bowel incontinence Bowel incontinence likely due to chemoradiation, with symptoms of urgency and soft stools, but not diarrhea. Currently undergoing pelvic physical therapy. - Continue pelvic physical therapy to strengthen pelvic muscles and improve bowel control.  Unintentional weight loss and fatigue Experiencing unintentional weight loss from 167 lbs to 162 lbs and fatigue. Reports early satiety without pain. Symptoms could be related to cancer recurrence, but lymph nodes are not large enough to cause symptoms. - Recommend nutritional supplements such as Ensure, Boost, Glucerna, or protein powder smoothies between meals. - Monitor weight and nutritional intake.  Anemia, currently improved Anemia has improved with current blood counts, but fatigue persists. - Recommend over-the-counter prenatal vitamins to address anemia and fatigue.  Plan - I personally reviewed her restaging PET scan with patient and her son and daughter.  I will reach out to IR to see if they can biopsy her left supraclavicular or lofty retroperitoneal lymph nodes. - Will obtain MRD test with Oncodetect  -f/u  after biopsy    SUMMARY OF ONCOLOGIC HISTORY: Oncology  History  Anal squamous cell carcinoma (HCC)  12/29/2023 Pathology Results   A. PERIANAL, RIGHT POSTERIOR, BIOPSY:       Invasive squamous cell carcinoma, moderately differentiated. Suspicious for lymphovascular invasion.  B. PERIANAL, LEFT POSTERIOR, BIOPSY:       High-grade squamous intraepithelial lesion (HSIL / AIN 3).  No evidence of definitive invasion.    01/20/2024 Initial Diagnosis   Anal cancer (HCC)   01/26/2024 Cancer Staging   Staging form: Anus, AJCC 8th Edition - Clinical: Stage IIIA (cT2, cN1c, cM0) - Signed by Lanny Callander, MD on 01/26/2024   01/31/2024 -  Chemotherapy   Patient is on Treatment Plan : ANUS Mitomycin  D1,28 + 5FU D1-4, 28-31 q32d        Discussed the use of AI scribe software for clinical note transcription with the patient, who gave verbal consent to proceed.  History of Present Illness Andrea [name] is a 68 year old female with anal cancer who presents for follow-up.  She experiences bowel control issues following chemoradiation, requiring immediate restroom access. Bowel movements occur two to three times daily and are soft. She is undergoing pelvic physical therapy to improve bowel control.  Her recent PET scan revealed new lymph nodes in the retroperitoneal area. She has a history of ulcerative colitis.  She experiences early satiety and weight loss, decreasing from 167 pounds to 162 pounds since her last visit. She feels fullness and pressure without pain when eating. She is not taking multivitamins or nutritional supplements.  She experiences shortness of breath. There are no recent viral infections, diarrhea, or stomach upset. She was exposed to COVID-19 three weeks ago but did not test positive or develop symptoms.     All other systems were reviewed with the patient and are negative.  MEDICAL HISTORY:  Past Medical History:  Diagnosis Date   Anemia    Anginal pain    Arthritis     Chest discomfort    Disorder of immune system    Dyspnea    Dyspnea on exertion    Hypercholesterolemia    Hypertension    Migraine    Non-ulcer dyspepsia    Osteopenia of spine    Peptic ulcer disease    Pneumonia    Polyp of rectum    cancerous   Pre-diabetes    Skin cancer    Ulcerative colitis (HCC)     SURGICAL HISTORY: Past Surgical History:  Procedure Laterality Date   ANKLE FRACTURE SURGERY Left    CARDIAC CATHETERIZATION     COLONOSCOPY     ESOPHAGOGASTRODUODENOSCOPY     hip replacement Bilateral    LEFT HEART CATH AND CORONARY ANGIOGRAPHY N/A 01/21/2022   Procedure: LEFT HEART CATH AND CORONARY ANGIOGRAPHY;  Surgeon: Verlin Lonni JONETTA, MD;  Location: [facility];  Service: Cardiovascular;  Laterality: N/A;   LEFT HEART CATH AND CORONARY ANGIOGRAPHY N/A 02/03/2024   Procedure: LEFT HEART CATH AND CORONARY ANGIOGRAPHY;  Surgeon: Darron Deatrice LABOR, MD;  Location: [facility];  Service: Cardiovascular;  Laterality: N/A;   RECTAL BIOPSY N/A 05/04/2022   Procedure: BIOPSY RECTAL VS EXCISION OF PERIANAL LESION UNDER ANOSCOPY;  Surgeon: Teresa Lonni HERO, MD;  Location: [facility];  Service: General;  Laterality: N/A;   TOTAL KNEE ARTHROPLASTY Left 10/19/2022   Procedure: LEFT TOTAL KNEE ARTHROPLASTY;  Surgeon: Jerri Kay HERO, MD;  Location: [facility];  Service: Orthopedics;  Laterality: Left;   TUBAL LIGATION     TUMOR EXCISION N/A 01/14/2024  Procedure: TRANSRECTAL TUMOR EXCISION;  Surgeon: Teresa Lonni HERO, MD;  Location: [facility];  Service: General;  Laterality: N/A;  Transanal excision of distal rectal and anal lesion (wide local)    I have reviewed the social history and family history with the patient and they are unchanged from previous note.  ALLERGIES:  is allergic to codeine.  MEDICATIONS:  Current Outpatient Medications  Medication Sig Dispense Refill   acetaminophen  (TYLENOL ) 500 MG tablet Take 1,000 mg by mouth every 6  (six) hours as needed for moderate pain.     albuterol  (VENTOLIN  HFA) 108 (90 Base) MCG/ACT inhaler Inhale 1-2 puffs into the lungs every 4 (four) hours as needed for wheezing or shortness of breath. 8 g 0   amLODipine  (NORVASC ) 5 MG tablet Take 1 tablet (5 mg total) by mouth daily. 90 tablet 3   ammonium lactate (LAC-HYDRIN) 12 % lotion Apply 1 Application topically daily.     atorvastatin  (LIPITOR) 80 MG tablet TAKE ONE TABLET BY MOUTH DAILY 90 tablet 1   ezetimibe  (ZETIA ) 10 MG tablet TAKE 1 TABLET BY MOUTH DAILY 90 tablet 3   famotidine  (PEPCID ) 20 MG tablet Take 1 tablet (20 mg total) by mouth 2 (two) times daily as needed for heartburn or indigestion. 60 tablet 1   fluticasone  (FLONASE ) 50 MCG/ACT nasal spray Place 2 sprays into both nostrils as needed for allergies or rhinitis.     hydrocortisone  (ANUSOL -HC) 25 MG suppository Place 1 suppository (25 mg total) rectally 2 (two) times daily. 12 suppository 2   nitroGLYCERIN  (NITROSTAT ) 0.4 MG SL tablet Place 1 tablet (0.4 mg total) under the tongue every 5 (five) minutes as needed for chest pain. 20 tablet 12   ondansetron  (ZOFRAN ) 8 MG tablet Take 1 tablet (8 mg total) by mouth every 8 (eight) hours as needed for nausea or vomiting. 30 tablet 1   oxyCODONE  (OXY IR/ROXICODONE ) 5 MG immediate release tablet Take 0.5-1 tablets (2.5-5 mg total) by mouth every 4 (four) hours as needed for severe pain (pain score 7-10). 60 tablet 0   PARoxetine  (PAXIL ) 20 MG tablet Take 10 mg by mouth daily.     silver  sulfADIAZINE  (SILVADENE ) 1 % cream Apply 1 Application topically 3 (three) times daily as needed. Apply to buttocks area as needed 50 g 1   SUMAtriptan (IMITREX) 25 MG tablet Take 25 mg by mouth every 2 (two) hours as needed for migraine.     vedolizumab  (ENTYVIO ) 300 MG injection Inject 300 mg into the vein every 8 (eight) weeks.     No current facility-administered medications for this visit.    PHYSICAL EXAMINATION: ECOG PERFORMANCE STATUS: 1 -  Symptomatic but completely ambulatory  Vitals:   06/22/24 0926  BP: 138/62  Pulse: 86  Resp: 16  Temp: 98 F (36.7 C)  SpO2: 99%   Wt Readings from Last 3 Encounters:  06/22/24 160 lb 11.2 oz (72.9 kg)  05/11/24 167 lb 3.2 oz (75.8 kg)  04/28/24 164 lb 12.8 oz (74.8 kg)     GENERAL:alert, no distress and comfortable SKIN: skin color, texture, turgor are normal, no rashes or significant lesions EYES: normal, Conjunctiva are pink and non-injected, sclera clear NECK: supple, thyroid normal size, non-tender, without nodularity LYMPH:  no palpable lymphadenopathy in the cervical, axillary  LUNGS: clear to auscultation and percussion with normal breathing effort HEART: regular rate & rhythm and no murmurs and no lower extremity edema ABDOMEN:abdomen soft, non-tender and normal bowel sounds Musculoskeletal:no cyanosis of digits  and no clubbing  NEURO: alert & oriented x 3 with fluent speech, no focal motor/sensory deficits  Physical Exam MEASUREMENTS: Weight- 162.  LABORATORY DATA:  I have reviewed the data as listed    Latest Ref Rng & Units 06/15/2024    9:36 AM 04/18/2024    9:27 AM 03/21/2024    9:36 AM  CBC  WBC 4.0 - 10.5 K/uL 3.5  4.0  3.7   Hemoglobin 12.0 - 15.0 g/dL 89.6  9.7  8.9   Hematocrit 36.0 - 46.0 % 30.8  29.0  25.4   Platelets 150 - 400 K/uL 180  203  148         Latest Ref Rng & Units 06/15/2024    9:36 AM 04/18/2024    9:27 AM 03/21/2024    9:36 AM  CMP  Glucose 70 - 99 mg/dL 890  896  879   BUN 8 - 23 mg/dL 17  13  10    Creatinine 0.44 - 1.00 mg/dL 9.14  9.13  9.21   Sodium 135 - 145 mmol/L 140  141  141   Potassium 3.5 - 5.1 mmol/L 4.0  4.3  3.5   Chloride 98 - 111 mmol/L 111  110  109   CO2 22 - 32 mmol/L 26  26  24    Calcium  8.9 - 10.3 mg/dL 9.2  9.2  8.9   Total Protein 6.5 - 8.1 g/dL 6.5  6.7  6.1   Total Bilirubin 0.0 - 1.2 mg/dL 0.4  0.6  0.4   Alkaline Phos 38 - 126 U/L 68  71  61   AST 15 - 41 U/L 18  20  19    ALT 0 - 44 U/L 11  11  13         RADIOGRAPHIC STUDIES: I have personally reviewed the radiological images as listed and agreed with the findings in the report. No results found.    No orders of the defined types were placed in this encounter.  All questions were answered. The patient knows to call the clinic with any problems, questions or concerns. No barriers to learning was detected. The total time spent in the appointment was 40 minutes, including review of chart and various tests results, discussions about plan of care and coordination of care plan     Onita Mattock, MD 06/22/2024

## 2024-06-22 NOTE — Telephone Encounter (Signed)
 LVM stating that Dr. Lanny ordered a biopsy as a result of pt's recent PET Scan results.  Stated that the appt is scheduled on 07/03/2024 at 1pm with an arrival time of 12:30pm at [facility].  Stated pt will  come to [facility] entrance in the front of the hospital right beside of the ER. Stated someone from that team will give the pt a call the day prior to their procedure and go over instructions and directions.  Instructed pt to be on the look out for them giving her a call the day before.  Instructed pt to contact Dr. Demetra office should she have additional questions or concerns.

## 2024-06-22 NOTE — Progress Notes (Signed)
 Verbal order w/readback from Dr. Lanny for CT Biopsy of abdomen based off pt's recent PET Scan results.  Biopsy to be done w/in the next week per conversation with Dr. Lanny and Dr. Juliene Balder via Secure Chat.  Ordered placed and scheduling message sent to Central Scheduling Biopsy schedulers for PA and processing.  Pt made aware of biopsy order.  Stated someone from our central scheduling team will be in contact with the pt to get him scheduled.

## 2024-06-22 NOTE — Progress Notes (Signed)
 Verbal order w/readback order from Dr. Lanny for Oncodetect to be drawn on 06/22/2024. Order placed in EPIC and in [employer] portal.  Oncodetect kit and requisition given to [employer].

## 2024-06-22 NOTE — Progress Notes (Signed)
 Biopsy approval from Dr. Philip

## 2024-06-30 NOTE — Progress Notes (Signed)
 Patient for US  guided FNA abd mass biopsy on Monday 07/03/24, I called and LVM for the patient on the phone and gave pre-procedure instructions. VM made the patient aware to be here at 12:30p and check in at the [facility] registration desk. Called 06/30/24

## 2024-07-03 ENCOUNTER — Ambulatory Visit
Admission: RE | Admit: 2024-07-03 | Discharge: 2024-07-03 | Disposition: A | Discharge status: Home / Self Care | Source: Ambulatory Visit | Attending: Hematology | Admitting: Hematology

## 2024-07-03 DIAGNOSIS — C210 Malignant neoplasm of anus, unspecified: Secondary | ICD-10-CM

## 2024-07-03 DIAGNOSIS — R1909 Other intra-abdominal and pelvic swelling, mass and lump: Secondary | ICD-10-CM

## 2024-07-05 ENCOUNTER — Other Ambulatory Visit: Payer: Self-pay

## 2024-07-05 NOTE — Progress Notes (Signed)
 Patient for US  guided FNA soft tissue biopsy on Thurs 07/06/24, I called and spoke with the patient on the phone and gave pre-procedure instructions. Pt was made aware to be here at 1:30p and check in at the [facility] registration desk. Pt stated understanding. Called 07/05/24

## 2024-07-06 ENCOUNTER — Ambulatory Visit
Admission: RE | Admit: 2024-07-06 | Discharge: 2024-07-06 | Disposition: A | Discharge status: Home / Self Care | Source: Ambulatory Visit | Attending: Hematology | Admitting: Hematology

## 2024-07-06 ENCOUNTER — Other Ambulatory Visit: Payer: Self-pay | Admitting: Hematology

## 2024-07-06 ENCOUNTER — Ambulatory Visit

## 2024-07-06 DIAGNOSIS — R1909 Other intra-abdominal and pelvic swelling, mass and lump: Secondary | ICD-10-CM | POA: Diagnosis present

## 2024-07-06 DIAGNOSIS — C210 Malignant neoplasm of anus, unspecified: Secondary | ICD-10-CM | POA: Insufficient documentation

## 2024-07-06 MED ORDER — LIDOCAINE HCL (PF) 1 % IJ SOLN
5.0000 mL | Freq: Once | INTRAMUSCULAR | Status: AC
  Administered 2024-07-06: 5 mL via INTRADERMAL

## 2024-07-06 MED ORDER — VEDOLIZUMAB 300 MG IV SOLR
300.0000 mg | Freq: Once | INTRAVENOUS | Status: DC
  Filled 2024-07-06: qty 5

## 2024-07-06 NOTE — Procedures (Signed)
 Vascular and Interventional Radiology Procedure Note  Patient: Andrea [name] DOB: 09-Dec-1955 Medical Record Number: [id number] Note Date/Time: 07/06/24 3:06 PM   Performing Physician: Thom Hall, MD Assistant(s): None  Diagnosis: Hx anal CA. HM L supraclavicular cervical LN   Procedure: LEFT SUPRACLAVICULAR LYMPH NODE BIOPSY  Anesthesia: Local Anesthetic Complications: None Estimated Blood Loss: Minimal Specimens: Sent for Pathology  Findings:  Successful Ultrasound-guided biopsy of a L supraclavicular cervical LN . A total of 3 samples were obtained. Hemostasis of the tract was achieved using Manual Pressure.  Plan: Bed rest for 0 hours.  See detailed procedure note with images in PACS. The patient tolerated the procedure well without incident or complication and was returned to Recovery in stable condition.    Thom Hall, MD [facility]   Pager. [phone] Clinic. [phone]

## 2024-07-07 ENCOUNTER — Telehealth: Payer: Self-pay

## 2024-07-07 LAB — SURGICAL PATHOLOGY

## 2024-07-07 NOTE — Telephone Encounter (Signed)
 Spoke with pt via telephone to schedule telephone visit for 07/12/2024 @815  to discuss recent biopsy results. Pt confirmed appt while on the phone.  Pt is aware that Dr. Lanny may call at anytime throughout the day.

## 2024-07-10 ENCOUNTER — Ambulatory Visit: Attending: Hematology | Admitting: Physical Therapy

## 2024-07-10 DIAGNOSIS — M6281 Muscle weakness (generalized): Secondary | ICD-10-CM | POA: Insufficient documentation

## 2024-07-10 DIAGNOSIS — M62838 Other muscle spasm: Secondary | ICD-10-CM | POA: Insufficient documentation

## 2024-07-10 NOTE — Therapy (Signed)
 OUTPATIENT PHYSICAL THERAPY FEMALE PELVIC TREATMENT   Patient Name: Andrea [name] MRN: [id number] DOB:[birth year], 68 y.o., female Today's Date: 07/10/2024  END OF SESSION:  PT End of Session - 07/10/24 0805     Visit Number 10    Date for Recertification  08/01/24    Authorization Type Medicare    Progress Note Due on Visit 20    PT Start Time 0802    PT Stop Time 0842    PT Time Calculation (min) 40 min    Activity Tolerance Patient tolerated treatment well    Behavior During Therapy WFL for tasks assessed/performed           Past Medical History:  Diagnosis Date   Anemia    Anginal pain    Arthritis    Chest discomfort    Disorder of immune system    Dyspnea    Dyspnea on exertion    Hypercholesterolemia    Hypertension    Migraine    Non-ulcer dyspepsia    Osteopenia of spine    Peptic ulcer disease    Pneumonia    Polyp of rectum    cancerous   Pre-diabetes    Skin cancer    Ulcerative colitis (HCC)    Past Surgical History:  Procedure Laterality Date   ANKLE FRACTURE SURGERY Left    CARDIAC CATHETERIZATION     COLONOSCOPY     ESOPHAGOGASTRODUODENOSCOPY     hip replacement Bilateral    LEFT HEART CATH AND CORONARY ANGIOGRAPHY N/A 01/21/2022   Procedure: LEFT HEART CATH AND CORONARY ANGIOGRAPHY;  Surgeon: Verlin Lonni JONETTA, MD;  Location: [facility];  Service: Cardiovascular;  Laterality: N/A;   LEFT HEART CATH AND CORONARY ANGIOGRAPHY N/A 02/03/2024   Procedure: LEFT HEART CATH AND CORONARY ANGIOGRAPHY;  Surgeon: Darron Deatrice LABOR, MD;  Location: [facility];  Service: Cardiovascular;  Laterality: N/A;   RECTAL BIOPSY N/A 05/04/2022   Procedure: BIOPSY RECTAL VS EXCISION OF PERIANAL LESION UNDER ANOSCOPY;  Surgeon: Teresa Lonni HERO, MD;  Location: [facility];  Service: General;  Laterality: N/A;   TOTAL KNEE ARTHROPLASTY Left 10/19/2022   Procedure: LEFT TOTAL KNEE ARTHROPLASTY;  Surgeon: Jerri Kay HERO, MD;  Location: [facility];   Service: Orthopedics;  Laterality: Left;   TUBAL LIGATION     TUMOR EXCISION N/A 01/14/2024   Procedure: TRANSRECTAL TUMOR EXCISION;  Surgeon: Teresa Lonni HERO, MD;  Location: [facility];  Service: General;  Laterality: N/A;  Transanal excision of distal rectal and anal lesion (wide local)   Patient Active Problem List   Diagnosis Date Noted   Anal cancer (HCC) 02/28/2024   Coronary vasospasm 02/28/2024   Myopericarditis 02/04/2024   Primary hypertension 02/04/2024   Non-ST elevation (NSTEMI) myocardial infarction (HCC) 02/03/2024   Chest pain 02/03/2024   Pleuritic chest pain 02/02/2024   Coronary artery disease due to lipid rich plaque 02/02/2024   Aortic atherosclerosis 02/02/2024   Port-A-Cath in place 01/31/2024   Anal squamous cell carcinoma (HCC) 01/20/2024   AIN III (anal intraepithelial neoplasia III) 11/09/2023   Anal irritation 11/09/2023   History of anal cancer 11/09/2023   Status post total left knee replacement 10/19/2022   Primary osteoarthritis of left knee 09/30/2022   Chest pain of uncertain etiology    Ulcerative colitis (HCC) 07/06/2019   PUD (peptic ulcer disease) 07/06/2019    PCP: Marvene Prentice SAUNDERS, FNP  REFERRING PROVIDER:  Lanny Callander, MD  REFERRING DIAG: C21.0 (ICD-10-CM) - Anal  cancer [facility])  THERAPY DIAG:  Other muscle spasm  Muscle weakness (generalized)  Rationale for Evaluation and Treatment: Rehabilitation  ONSET DATE: 12/2023  SUBJECTIVE:                                                                                                                                                                                           SUBJECTIVE STATEMENT: Urinary frequency still increased at night (5-6x), bowels are still urgent. Small bowel incontinence noted regularly (slightly more than smearing). Wears pads during the day and notes cream colored discharge from vagina per pt, also changing 3-4x due to stool leakage no urinary leakage  during day, does think she has a little during the night.  Pain has been getting better, no pain with bowel movements, but sometimes will have a little pain with passing stool and has had scant blood on tissue with wiping but thinks this is from tearing as stools are getting more formed.   Fluid intake: water - 1 quart, coffee in morning 1 cups, tea daily 1x  PAIN: no 10/20 PAIN:  Are you having pain? Yes NPRS scale: 7/10 Pain location: rectum  Pain orientation: Other: rectal   PAIN TYPE: burning and sharp Pain description: constant  Aggravating factors: passing stool, goes away a few mins after emptying Relieving factors: just finishing bowel movement    PRECAUTIONS: None  RED FLAGS: None   WEIGHT BEARING RESTRICTIONS: No  FALLS:  Has patient fallen in last 6 months? No  OCCUPATION: retired  ACTIVITY LEVEL : not active in the last 3 weeks  PLOF: Independent  PATIENT GOALS: does not know  PERTINENT HISTORY:  ANKLE FRACTURE SURGERY Left     CARDIAC CATHETERIZATION      COLONOSCOPY      ESOPHAGOGASTRODUODENOSCOPY      hip replacement Bilateral     LEFT HEART CATH AND CORONARY ANGIOGRAPHY N/A 01/21/2022 Procedure: LEFT HEART CATH AND CORONARY ANGIOGRAPHY;  Surgeon: Verlin Lonni BIRCH, MD;  Location: [facility];  Service: Cardiovascular;  Laterality: N/A;   RECTAL BIOPSY N/A 05/04/2022 Procedure: BIOPSY RECTAL VS EXCISION OF PERIANAL LESION UNDER ANOSCOPY;  Surgeon: Teresa Lonni HERO, MD;  Location: [facility];  Service: General;  Laterality: N/A;   TOTAL KNEE ARTHROPLASTY Left 10/19/2022 Procedure: LEFT TOTAL KNEE ARTHROPLASTY;  Surgeon: Jerri Kay HERO, MD;  Location: [facility];  Service: Orthopedics;  Laterality: Left;   TUBAL LIGATION      TUMOR EXCISION        Sexual abuse: No  BOWEL MOVEMENT: Pain with bowel movement: Yes Type of bowel movement:Type ([place] Stool Scale) 5-6, Frequency 2-3x daily, and Strain  yes Fully empty rectum: No Leakage: Yes: very  short warning and will loss full bowels if can't make it quickly enough  Pads: Yes: 3-4x daily, no longer wearing at night  Fiber supplement/laxative No  URINATION: Pain with urination: yes - at start of treatments was exteremly painful but now a little  Fully empty bladder: Yes:  Stream: Strong Urgency: Yes  Frequency: every 2 hours, usually 3-4x nightly  Leakage: none Pads: No  INTERCOURSE: not active   PREGNANCY: Vaginal deliveries 3 Tearing Yes: big babies PROLAPSE: None   OBJECTIVE:  Note: Objective measures were completed at Evaluation unless otherwise noted.  DIAGNOSTIC FINDINGS:  Anal cancer  PATIENT SURVEYS:    PFIQ-7: 0 04/10/24: 179  COGNITION: Overall cognitive status: Within functional limits for tasks assessed     SENSATION: Light touch: Appears intact  LUMBAR SPECIAL TESTS:  Straight leg raise test: Negative  FUNCTIONAL TESTS:  5 times sit to stand: 13 seconds  GAIT: Assistive device utilized: None Comments: antalgic  POSTURE: rounded shoulders and forward head   LUMBARAROM/PROM: full   LOWER EXTREMITY ROM: full  LOWER EXTREMITY MMT: right hip 4-/5 flexion, left hip 4/5  PALPATION:   General: upper chest breathing, no tightness bilateral hip scars, low abdominal tone  Pelvic Alignment: even  Abdominal: abdominal doming supra umbilical, low abdominal tone                 External Perineal Exam: mild dryness noticed                             Internal Pelvic Floor: tight and tender throughout  Patient confirms identification and approves PT to assess internal pelvic floor and treatment yes Patient confirms identification and approves PT to assess internal pelvic floor and treatment Yes No emotional/communication barriers or cognitive limitation. Patient is motivated to learn. Patient understands and agrees with treatment goals and plan. PT explains patient will be examined in standing, sitting, and lying down to see how their  muscles and joints work. When they are ready, they will be asked to remove their underwear so PT can examine their perineum. The patient is also given the option of providing their own chaperone as one is not provided in our facility. The patient also has the right and is explained the right to defer or refuse any part of the evaluation or treatment including the internal exam. With the patient's consent, PT will use one gloved finger to gently assess the muscles of the pelvic floor, seeing how well it contracts and relaxes and if there is muscle symmetry. After, the patient will get dressed and PT and patient will discuss exam findings and plan of care. PT and patient discuss plan of care, schedule, attendance policy and HEP activities. - 04/24/2024     PELVIC MMT:   MMT eval  Vaginal 3/5  Internal Anal Sphincter   External Anal Sphincter   Puborectalis   Diastasis Recti 1 finger at and under umbilicus, doming above umbilicus  (Blank rows = not tested)        TONE: high  PROLAPSE: None noticed in hooklying  TODAY'S TREATMENT:  DATE:   07/10/24: Pt educated on urge drill for day and night  Manual at low back, left PSIS TTP and had tissue tightness surrounding this and slightly improvement with STM Seated happy baby 3x30s Piriformis stretch sitting 2x30s each Seated hip flexion blue band 2x10 each Transverse abdominis activation in sitting with ball squeeze 2x10 Seated hip flexion and abduction blue band x10 each  05/31/24: Pt consented to internal pelvic floor assessment and treatment rectally today. Found to have restrictions in tissue mobility in all directions but worse at lt and most painful at Lt though bil. Initially manual to external pelvic floor for improved tolerance to internal, did have external tension at bil superficial transverse perineal muscles,  perineal body, and bil obturators. Moderate release here. With pt consent, internal manual progressed. Gentle light manual in all directions with cues for relaxation and diaphragmatic breathing, improved effect. Pt did have 5/10 pain with palpation but did not increase and by end 3/10.  Vibration plate [id number] in standing, low setting cues for pelvic drops for improved tissue mobility and relaxation    05/24/24: Lumbar roll outs x10 Rt/Lt/Ct  Seated on ball pelvic circles x10 each, hip shift 3x30s each, pelvic drops with diaphragmatic breathing x10 Standing L with hip IR rotation 3x30s Elevated lunge with trunk rotation 3x30s each Modified cat/cow x10 with moderate cues for techniques  Wind shield wipers x15 each Single knee to chest 3x30s 2x10 bridges  Hooklying blue band hip abduction 2x10  PATIENT EDUCATION:  Education details: relevant anatomy, exam findings, expectations of PT, chemo, radiation Person educated: Patient Education method: Explanation, Demonstration, Tactile cues, Verbal cues, and Handouts Education comprehension: verbalized understanding, returned demonstration, verbal cues required, tactile cues required, and needs further education  HOME EXERCISE PROGRAM: Access Code: [email or web address] URL: [email or web address] Date: 04/17/2024 Prepared by: Darryle  Exercises - Walk for 30 mins total a day as able  - 1 x daily - 7 x weekly - Sidelying Thoracic Rotation with Open Book  - 1 x daily - 7 x weekly - 1 sets - 10 reps - Supine Hip Internal and External Rotation  - 1 x daily - 7 x weekly - 1 sets - 10 reps - Single Knee to Chest Stretch  - 1 x daily - 7 x weekly - 1 sets - 3 reps - 30s holds - Supported Teacher, music with Pelvic Floor Relaxation  - 1 x daily - 7 x weekly - 1 sets - 3 reps - 30s holds - Unilateral Supported Supine Butterfly Stretch  - 1 x daily - 7 x weekly - 1 sets - 3 reps - 30s holds  Patient Education - Bowel Emptying Techniques - Abdominal  Massage for Constipation  ASSESSMENT:  CLINICAL IMPRESSION: Patient was seen today for treatment of anal cancer rehab.pt continues to impaired bowel habits. Pt tolerated session well does continue  to have bowel incontinence and increased urgency. Also increased nighttime frequency. Pain has resolved with bowel movement. Pt does have continued weakness in bil hips and core for sessions to progress toward. Patient is progressing well towards goals and will benefit from continued PT to address deficits, reduce pain with bowel movements and bladder voids and improve quality of life.       OBJECTIVE IMPAIRMENTS: Abnormal gait, decreased activity tolerance, decreased balance, decreased coordination, decreased endurance, difficulty walking, decreased strength, and impaired tone.   ACTIVITY LIMITATIONS: squatting and caring for others  PARTICIPATION LIMITATIONS: community activity and yard work  PERSONAL FACTORS: 1 comorbidity: anal cancer  are also affecting patient's functional outcome.   REHAB POTENTIAL: Good  CLINICAL DECISION MAKING: Stable/uncomplicated  EVALUATION COMPLEXITY: Low   GOALS: Goals reviewed with patient? Yes  SHORT TERM GOALS: Target date: 4 weeks after her treatment ends    Pt will be independent with HEP.   Baseline: Goal status: MET   LONG TERM GOALS: Target date: 04/20/2024 updated 08/01/2024    Pt will be independent with advanced HEP.   Baseline:  Goal status: progressing  2.  Pt will be able to participate in at least 45 mins exercise session without increased fatigue Baseline:  Goal status: on going  3.  Pt will dem at least 12 sec for 5xSTS to demonstrate good hip strength to help with ease of transfers in and out of the car , bed and chair Baseline:  Goal status: on going   4.  Pt will dem good balance to reduce risk of fall Baseline:  Goal status: progressing  5.  Pt will have 0 pelvic pain, be able to contract/ relax and bulge her pelvic  floor with good strength and coordination Baseline:  Goal status: on going   PLAN:  PT FREQUENCY: 1-2x/week  PT DURATION: 12 weeks  PLANNED INTERVENTIONS: 97110-Therapeutic exercises, 97530- Therapeutic activity, 97112- Neuromuscular re-education, 97535- Self Care, [id number]- Manual therapy, 97032- Electrical stimulation (manual), Taping, Dry Needling, Joint mobilization, Joint manipulation, Spinal manipulation, Spinal mobilization, Manual lymph drainage, Compression bandaging, Cryotherapy, Moist heat, and Biofeedback  PLAN FOR NEXT SESSION: re- eval, internal pelvic floor muscle assessment, develop HEP, strengthening post tx  Darryle Navy, PT, DPT 10/20/259:34 AM  [facility] [address], [address] [place], [place] [address] Phone # [phone] Fax [phone]

## 2024-07-10 NOTE — Patient Instructions (Signed)
   Urge Incontinence  Ideal urination frequency is every 2-4 wakeful hours, which equates to 5-8 times within a 24-hour period.   Urge incontinence is leakage that occurs when the bladder muscle contracts, creating a sudden need to go before getting to the bathroom.   Going too often when your bladder isn't actually full can disrupt the body's automatic signals to store and hold urine longer, which will increase urgency/frequency.  In this case, the bladder "is running the show" and strategies can be learned to retrain this pattern.   One should be able to control the first urge to urinate, at around .  The bladder can hold up to a "grande latte," or . To help you gain control, practice the Urge Drill below when urgency strikes.  This drill will help retrain your bladder signals and allow you to store and hold urine longer.  The overall goal is to stretch out your time between voids to reach a more manageable voiding schedule.    Practice your "quick flicks" often throughout the day (each waking hour) even when you don't need feel the urge to go.  This will help strengthen your pelvic floor muscles, making them more effective in controlling leakage.  Urge Drill  When you feel an urge to go, follow these steps to regain control: Stop what you are doing and be still Take one deep breath, directing your air into your abdomen Think an affirming thought, such as "I've got this." Do 5 quick flicks of your pelvic floor Walk with control to the bathroom to void, or delay voiding

## 2024-07-11 ENCOUNTER — Ambulatory Visit: Admitting: *Deleted

## 2024-07-11 VITALS — BP 138/70 | HR 75 | Temp 98.3°F | Resp 16 | Ht 65.0 in | Wt 162.8 lb

## 2024-07-11 DIAGNOSIS — K5130 Ulcerative (chronic) rectosigmoiditis without complications: Secondary | ICD-10-CM

## 2024-07-11 MED ORDER — VEDOLIZUMAB 300 MG IV SOLR
300.0000 mg | Freq: Once | INTRAVENOUS | Status: AC
  Administered 2024-07-11: 300 mg via INTRAVENOUS
  Filled 2024-07-11: qty 5

## 2024-07-11 NOTE — Progress Notes (Signed)
 Diagnosis: Ulcerative Colitis  Provider:  Lonna Coder MD  Procedure: IV Infusion  IV Type: Peripheral, IV Location: L Antecubital  Entyvio  (Vedolizumab ), Dose: 300 mg  Infusion Start Time: 1609  Infusion Stop Time: 1642  Post Infusion IV Care: Peripheral IV Discontinued  Discharge: Condition: Good, Destination: Home . AVS Declined  Performed by:  Waddell LOISE Freshwater, RN

## 2024-07-11 NOTE — Progress Notes (Signed)
 DISCONTINUE ON PATHWAY REGIMEN - Anal Carcinoma     One cycle, concurrent with RT:     Fluorouracil       Mitomycin    **Always confirm dose/schedule in your pharmacy ordering system**  PRIOR TREATMENT: ANLOS01: Fluorouracil  1,000 mg/m2/day CIV D1-4, 29-32 + Mitomycin  10 mg/m2 D1, 29 + Concurrent Radiation Therapy  START ON PATHWAY REGIMEN - Anal Carcinoma     Cycles 1 through 6: A cycle is every 28 days:     Retifanlimab-dlwr      Paclitaxel      Carboplatin    Cycles 7 through 13: A cycle is every 28 days:     Retifanlimab-dlwr   **Always confirm dose/schedule in your pharmacy ordering system**  Patient Characteristics: Anal Canal Tumors, Distant Metastases / Local Recurrence - Unresectable, First Line Therapeutic Status: Distant Metastases Line of therapy: First Line Intent of Therapy: Non-Curative / Palliative Intent, Discussed with Patient

## 2024-07-11 NOTE — Assessment & Plan Note (Addendum)
 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  -PET Jan 25, 2024 showed long segment anorectal hypermetabolic activity, and hypermetabolic right inguinal and external iliac lymph nodes metastasis.  No distant metastasis. -she started concurrent chemoradiation with 5-FU and mitomycin  on Jan 31, 2024. -she was hospitalized for pericarditis on 02/02/2024, clinically suspicious for 5-fu induced coronary artery spasm. Chemo held on day 4 -She was admitted to hospital for cycle 2 chemotherapy with cardiology on board, she still developed a significant recurrent chest pain and 5-FU infusion was stopped on day 4 -RT completed on 03/16/24 -PET 06/12/2024 showed resolved hypermetabolic activity in the anus and right inguinal and pelvic lymph nodes, however she has mildly progressive hypermetabolic retroperitoneal and new left [place] adenopathy. Biopsy of the left [place] node confirmed metastatic anal cancer.  -I recommend chemo carbo taxol and immunotherapy zynyz

## 2024-07-12 ENCOUNTER — Inpatient Hospital Stay (HOSPITAL_BASED_OUTPATIENT_CLINIC_OR_DEPARTMENT_OTHER): Admitting: Hematology

## 2024-07-12 ENCOUNTER — Other Ambulatory Visit: Payer: Self-pay

## 2024-07-12 ENCOUNTER — Encounter: Payer: Self-pay | Admitting: Hematology

## 2024-07-12 DIAGNOSIS — C210 Malignant neoplasm of anus, unspecified: Secondary | ICD-10-CM | POA: Diagnosis not present

## 2024-07-12 DIAGNOSIS — C778 Secondary and unspecified malignant neoplasm of lymph nodes of multiple regions: Secondary | ICD-10-CM | POA: Diagnosis not present

## 2024-07-12 MED ORDER — ONDANSETRON HCL 8 MG PO TABS: 8.0000 mg | ORAL_TABLET | Freq: Three times a day (TID) | ORAL | 1 refills | Status: DC | PRN

## 2024-07-12 MED ORDER — LIDOCAINE-PRILOCAINE 2.5-2.5 % EX CREA: TOPICAL_CREAM | CUTANEOUS | 3 refills | Status: AC

## 2024-07-12 MED ORDER — PROCHLORPERAZINE MALEATE 10 MG PO TABS: 10.0000 mg | ORAL_TABLET | Freq: Four times a day (QID) | ORAL | 1 refills | Status: DC | PRN

## 2024-07-12 NOTE — Assessment & Plan Note (Signed)
 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  -PET Jan 25, 2024 showed long segment anorectal hypermetabolic activity, and hypermetabolic right inguinal and external iliac lymph nodes metastasis.  No distant metastasis. -she started concurrent chemoradiation with 5-FU and mitomycin  on Jan 31, 2024. -she was hospitalized for pericarditis on 02/02/2024, clinically suspicious for 5-fu induced coronary artery spasm. Chemo held on day 4 -She was admitted to hospital for cycle 2 chemotherapy with cardiology on board, she still developed a significant recurrent chest pain and 5-FU infusion was stopped on day 4 -RT completed on 03/16/24 -PET 06/12/2024 showed resolved hypermetabolic activity in the anus and right inguinal and pelvic lymph nodes, however she has mildly progressive hypermetabolic retroperitoneal and new left [place] adenopathy. Biopsy of the left [place] node confirmed metastatic anal cancer.  -I recommend chemo carbo taxol and immunotherapy zynyz

## 2024-07-12 NOTE — Addendum Note (Signed)
 Addended by: LANNY CALLANDER on: 07/12/2024 11:43 AM   Modules accepted: Orders

## 2024-07-12 NOTE — Progress Notes (Signed)
 [facility]   [phone] Fax:([phone]) [phone]   Clinic Follow up Note   Patient Care Team: Marvene Prentice SAUNDERS, FNP as PCP - General (Family Medicine) Shlomo Wilbert SAUNDERS, MD as PCP - Cardiology (Cardiology) Ardis Evalene CROME, RN as Oncology Nurse Navigator Lanny Callander, MD as Consulting Physician (Hematology and Oncology) 07/12/2024  I connected with Andrea [name] on 07/12/24 at  8:15 AM EDT by telephone and verified that I am speaking with the correct person using two identifiers.   I discussed the limitations, risks, security and privacy concerns of performing an evaluation and management service by telephone and the availability of in person appointments. I also discussed with the patient that there may be a patient responsible charge related to this service. The patient expressed understanding and agreed to proceed.   Patient's location:  Home  Provider's location:  Office    CHIEF COMPLAINT: Follow-up biopsy results   CURRENT THERAPY: Pending chemotherapy carboplatin, paclitaxel and Zynyz  Oncology history Anal squamous cell carcinoma (HCC) cT2N1M0, stage IIIA -previous history of superficially invasive squamous cell carcinoma of anus, status post surgical resection in August 2023, presented with rectal discomfort for several months, and a palpable 2 to 3 cm mass in the anal canal, biopsy confirmed invasive squamous cell carcinoma in April 2025.  -PET Jan 25, 2024 showed long segment anorectal hypermetabolic activity, and hypermetabolic right inguinal and external iliac lymph nodes metastasis.  No distant metastasis. -she started concurrent chemoradiation with 5-FU and mitomycin  on Jan 31, 2024. -she was hospitalized for pericarditis on 02/02/2024, clinically suspicious for 5-fu induced coronary artery spasm. Chemo held on day 4 -She was admitted to hospital for cycle 2 chemotherapy with cardiology on board, she still developed a significant recurrent chest pain  and 5-FU infusion was stopped on day 4 -RT completed on 03/16/24 -PET 06/12/2024 showed resolved hypermetabolic activity in the anus and right inguinal and pelvic lymph nodes, however she has mildly progressive hypermetabolic retroperitoneal and new left [place] adenopathy. Biopsy of the left [place] node confirmed metastatic anal cancer.  -I recommend chemo carbo taxol and immunotherapy zynyz  Assessment & Plan Metastatic squamous cell carcinoma of anus with lymph node involvement (cervical and intra-abdominal) Biopsy on July 06, 2024, confirmed metastatic squamous cell carcinoma of the anus with lymph node involvement in the cervical and intra-abdominal regions. The cancer is poorly differentiated, indicating a more aggressive form compared to the initial moderate differentiation. The disease is treatable but not curable, with the goal being disease control and life prolongation. Without treatment, life expectancy is estimated at 6-12 months, while treatment could extend life to 2-3 years. Approximately 5% of patients may survive long-term with treatment. - Initiate chemotherapy with carboplatin and paclitaxel. - Administer immunotherapy with Zyanide. - Schedule port placement for chemotherapy administration. - Perform PET scan in three months to assess treatment response. - Provide lidocaine  cream for port site. - Prescribe anti-nausea medication. - Discuss wig options due to potential hair loss. - Monitor for side effects every other week during treatment.    Goal of care discussion  -We discussed the incurable nature of her cancer, and the overall poor prognosis, especially if she does not have good response to chemotherapy or progress on chemo -The patient understands the goal of care is palliative.   Plan - I reviewed her left supraclavicular lymph node biopsy result with patient and her husband, which unfortunately confirmed metastatic recurrence of her anal cancer. - I recommend starting  chemotherapy carboplatin, paclitaxel  and Zynyz.  Benefit and potential side effects reviewed with patient, she agrees to proceed.  Plan to start in 1 to 2 weeks. - Port placement by IR as soon as possible - Follow-up with first cycle treatment.   SUMMARY OF ONCOLOGIC HISTORY: Oncology History  Anal squamous cell carcinoma (HCC)  12/29/2023 Pathology Results   A. PERIANAL, RIGHT POSTERIOR, BIOPSY:       Invasive squamous cell carcinoma, moderately differentiated. Suspicious for lymphovascular invasion.  B. PERIANAL, LEFT POSTERIOR, BIOPSY:       High-grade squamous intraepithelial lesion (HSIL / AIN 3).  No evidence of definitive invasion.    01/20/2024 Initial Diagnosis   Anal cancer (HCC)   01/26/2024 Cancer Staging   Staging form: Anus, AJCC 8th Edition - Clinical: Stage IIIA (cT2, cN1c, cM0) - Signed by Lanny Callander, MD on 01/26/2024   01/31/2024 - 02/28/2024 Chemotherapy   Patient is on Treatment Plan : ANUS Mitomycin  D1,28 + 5FU D1-4, 28-31 q32d     07/21/2024 -  Chemotherapy   Patient is on Treatment Plan : ANUS Retifanlimab + Paclitaxel (80) + Carboplatin (5) / Retifanlimab       Discussed the use of AI scribe software for clinical note transcription with the patient, who gave verbal consent to proceed.  History of Present Illness Andrea [name] is a 68 year old female with a history of anal cancer who presents for a phone visit to review her biopsy results. She is accompanied by Norleen, who appears to be a family member or caregiver.  She has a history of squamous cell carcinoma of the anus, previously treated with radiation and chemotherapy. Despite initial treatment success, metastasis has occurred. A recent biopsy on July 06, 2024, of the left collarbone lymph nodes was performed. A PET scan indicates more significant activity in the lymph nodes behind the stomach compared to the collarbone area.  She expresses concerns about her heart due to poor tolerance of the previous  chemotherapy regimen, which was discontinued early. She seeks information on the potential cardiac effects of the new chemotherapy regimen.  Currently, she is asymptomatic with no cancer-related symptoms.     REVIEW OF SYSTEMS:   Constitutional: Denies fevers, chills or abnormal weight loss Eyes: Denies blurriness of vision Ears, nose, mouth, throat, and face: Denies mucositis or sore throat Respiratory: Denies cough, dyspnea or wheezes Cardiovascular: Denies palpitation, chest discomfort or lower extremity swelling Gastrointestinal:  Denies nausea, heartburn or change in bowel habits Skin: Denies abnormal skin rashes Lymphatics: Denies new lymphadenopathy or easy bruising Neurological:Denies numbness, tingling or new weaknesses Behavioral/Psych: Mood is stable, no new changes  All other systems were reviewed with the patient and are negative.  MEDICAL HISTORY:  Past Medical History:  Diagnosis Date   Anemia    Anginal pain    Arthritis    Chest discomfort    Disorder of immune system    Dyspnea    Dyspnea on exertion    Hypercholesterolemia    Hypertension    Migraine    Non-ulcer dyspepsia    Osteopenia of spine    Peptic ulcer disease    Pneumonia    Polyp of rectum    cancerous   Pre-diabetes    Skin cancer    Ulcerative colitis (HCC)     SURGICAL HISTORY: Past Surgical History:  Procedure Laterality Date   ANKLE FRACTURE SURGERY Left    CARDIAC CATHETERIZATION     COLONOSCOPY     ESOPHAGOGASTRODUODENOSCOPY  hip replacement Bilateral    LEFT HEART CATH AND CORONARY ANGIOGRAPHY N/A 01/21/2022   Procedure: LEFT HEART CATH AND CORONARY ANGIOGRAPHY;  Surgeon: Verlin Lonni BIRCH, MD;  Location: [facility];  Service: Cardiovascular;  Laterality: N/A;   LEFT HEART CATH AND CORONARY ANGIOGRAPHY N/A 02/03/2024   Procedure: LEFT HEART CATH AND CORONARY ANGIOGRAPHY;  Surgeon: Darron Deatrice LABOR, MD;  Location: [facility];  Service: Cardiovascular;   Laterality: N/A;   RECTAL BIOPSY N/A 05/04/2022   Procedure: BIOPSY RECTAL VS EXCISION OF PERIANAL LESION UNDER ANOSCOPY;  Surgeon: Teresa Lonni HERO, MD;  Location: [facility];  Service: General;  Laterality: N/A;   TOTAL KNEE ARTHROPLASTY Left 10/19/2022   Procedure: LEFT TOTAL KNEE ARTHROPLASTY;  Surgeon: Jerri Kay HERO, MD;  Location: [facility];  Service: Orthopedics;  Laterality: Left;   TUBAL LIGATION     TUMOR EXCISION N/A 01/14/2024   Procedure: TRANSRECTAL TUMOR EXCISION;  Surgeon: Teresa Lonni HERO, MD;  Location: [facility];  Service: General;  Laterality: N/A;  Transanal excision of distal rectal and anal lesion (wide local)    I have reviewed the social history and family history with the patient and they are unchanged from previous note.  ALLERGIES:  is allergic to codeine.  MEDICATIONS:  Current Outpatient Medications  Medication Sig Dispense Refill   acetaminophen  (TYLENOL ) 500 MG tablet Take 1,000 mg by mouth every 6 (six) hours as needed for moderate pain.     albuterol  (VENTOLIN  HFA) 108 (90 Base) MCG/ACT inhaler Inhale 1-2 puffs into the lungs every 4 (four) hours as needed for wheezing or shortness of breath. 8 g 0   amLODipine  (NORVASC ) 5 MG tablet Take 1 tablet (5 mg total) by mouth daily. 90 tablet 3   ammonium lactate (LAC-HYDRIN) 12 % lotion Apply 1 Application topically daily.     atorvastatin  (LIPITOR) 80 MG tablet TAKE ONE TABLET BY MOUTH DAILY 90 tablet 1   ezetimibe  (ZETIA ) 10 MG tablet TAKE 1 TABLET BY MOUTH DAILY 90 tablet 3   famotidine  (PEPCID ) 20 MG tablet Take 1 tablet (20 mg total) by mouth 2 (two) times daily as needed for heartburn or indigestion. 60 tablet 1   fluticasone  (FLONASE ) 50 MCG/ACT nasal spray Place 2 sprays into both nostrils as needed for allergies or rhinitis.     hydrocortisone  (ANUSOL -HC) 25 MG suppository Place 1 suppository (25 mg total) rectally 2 (two) times daily. 12 suppository 2   lidocaine -prilocaine  (EMLA ) cream  Apply to affected area once 30 g 3   nitroGLYCERIN  (NITROSTAT ) 0.4 MG SL tablet Place 1 tablet (0.4 mg total) under the tongue every 5 (five) minutes as needed for chest pain. 20 tablet 12   ondansetron  (ZOFRAN ) 8 MG tablet Take 1 tablet (8 mg total) by mouth every 8 (eight) hours as needed for nausea or vomiting. Start on the third day after carboplatin. 30 tablet 1   oxyCODONE  (OXY IR/ROXICODONE ) 5 MG immediate release tablet Take 0.5-1 tablets (2.5-5 mg total) by mouth every 4 (four) hours as needed for severe pain (pain score 7-10). 60 tablet 0   PARoxetine  (PAXIL ) 20 MG tablet Take 10 mg by mouth daily.     prochlorperazine  (COMPAZINE ) 10 MG tablet Take 1 tablet (10 mg total) by mouth every 6 (six) hours as needed for nausea or vomiting. 30 tablet 1   silver  sulfADIAZINE  (SILVADENE ) 1 % cream Apply 1 Application topically 3 (three) times daily as needed. Apply to buttocks area as needed 50  g 1   SUMAtriptan (IMITREX) 25 MG tablet Take 25 mg by mouth every 2 (two) hours as needed for migraine.     vedolizumab  (ENTYVIO ) 300 MG injection Inject 300 mg into the vein every 8 (eight) weeks.     No current facility-administered medications for this visit.    PHYSICAL EXAMINATION: Not performed   LABORATORY DATA:  I have reviewed the data as listed    Latest Ref Rng & Units 06/15/2024    9:36 AM 04/18/2024    9:27 AM 03/21/2024    9:36 AM  CBC  WBC 4.0 - 10.5 K/uL 3.5  4.0  3.7   Hemoglobin 12.0 - 15.0 g/dL 89.6  9.7  8.9   Hematocrit 36.0 - 46.0 % 30.8  29.0  25.4   Platelets 150 - 400 K/uL 180  203  148         Latest Ref Rng & Units 06/15/2024    9:36 AM 04/18/2024    9:27 AM 03/21/2024    9:36 AM  CMP  Glucose 70 - 99 mg/dL 890  896  879   BUN 8 - 23 mg/dL 17  13  10    Creatinine 0.44 - 1.00 mg/dL 9.14  9.13  9.21   Sodium 135 - 145 mmol/L 140  141  141   Potassium 3.5 - 5.1 mmol/L 4.0  4.3  3.5   Chloride 98 - 111 mmol/L 111  110  109   CO2 22 - 32 mmol/L 26  26  24    Calcium   8.9 - 10.3 mg/dL 9.2  9.2  8.9   Total Protein 6.5 - 8.1 g/dL 6.5  6.7  6.1   Total Bilirubin 0.0 - 1.2 mg/dL 0.4  0.6  0.4   Alkaline Phos 38 - 126 U/L 68  71  61   AST 15 - 41 U/L 18  20  19    ALT 0 - 44 U/L 11  11  13        RADIOGRAPHIC STUDIES: I have personally reviewed the radiological images as listed and agreed with the findings in the report. No results found.     I discussed the assessment and treatment plan with the patient. The patient was provided an opportunity to ask questions and all were answered. The patient agreed with the plan and demonstrated an understanding of the instructions.   The patient was advised to call back or seek an in-person evaluation if the symptoms worsen or if the condition fails to improve as anticipated.  I provided 40 minutes of non face-to-face telephone visit time during this encounter, including review of chart and various tests results, discussions about plan of care and coordination of care plan.    Onita Mattock, MD 07/12/24

## 2024-07-13 ENCOUNTER — Inpatient Hospital Stay: Admitting: Hematology

## 2024-07-13 ENCOUNTER — Telehealth: Payer: Self-pay

## 2024-07-13 VITALS — BP 138/78 | HR 104 | Temp 98.3°F | Resp 17 | Wt 162.5 lb

## 2024-07-13 DIAGNOSIS — C210 Malignant neoplasm of anus, unspecified: Secondary | ICD-10-CM | POA: Diagnosis not present

## 2024-07-13 DIAGNOSIS — C778 Secondary and unspecified malignant neoplasm of lymph nodes of multiple regions: Secondary | ICD-10-CM | POA: Diagnosis not present

## 2024-07-13 MED ORDER — ALPRAZOLAM 0.25 MG PO TABS: 0.2500 mg | ORAL_TABLET | Freq: Two times a day (BID) | ORAL | 0 refills | Status: DC | PRN

## 2024-07-13 NOTE — Progress Notes (Addendum)
 [facility]   Telephone:([phone]) [phone] Fax:([phone]   Clinic Follow up Note   Patient Care Team: Marvene Prentice SAUNDERS, FNP as PCP - General (Family Medicine) Shlomo Wilbert SAUNDERS, MD as PCP - Cardiology (Cardiology) Ardis Evalene CROME, RN as Oncology Nurse Navigator Lanny Callander, MD as Consulting Physician (Hematology and Oncology)  Date of Service:  07/13/2024  CHIEF COMPLAINT: f/u of metastatic anal cancer  CURRENT THERAPY:  Pending first-line chemotherapy carboplatin, paclitaxel and Zynyz  Oncology History   Anal squamous cell carcinoma (HCC) cT2N1M0, stage IIIA -previous history of superficially invasive squamous cell carcinoma of anus, status post surgical resection in August 2023, presented with rectal discomfort for several months, and a palpable 2 to 3 cm mass in the anal canal, biopsy confirmed invasive squamous cell carcinoma in April 2025.  -PET Jan 25, 2024 showed long segment anorectal hypermetabolic activity, and hypermetabolic right inguinal and external iliac lymph nodes metastasis.  No distant metastasis. -she started concurrent chemoradiation with 5-FU and mitomycin  on Jan 31, 2024. -she was hospitalized for pericarditis on 02/02/2024, clinically suspicious for 5-fu induced coronary artery spasm. Chemo held on day 4 -She was admitted to hospital for cycle 2 chemotherapy with cardiology on board, she still developed a significant recurrent chest pain and 5-FU infusion was stopped on day 4 -RT completed on 03/16/24 -PET 06/12/2024 showed resolved hypermetabolic activity in the anus and right inguinal and pelvic lymph nodes, however she has mildly progressive hypermetabolic retroperitoneal and new left [place] adenopathy. Biopsy of the left [place] node confirmed metastatic anal cancer.  -I recommend chemo carbo taxol and immunotherapy zynyz  Assessment & Plan Metastatic anal squamous cell carcinoma (stage IV) to nodes  Metastatic recurrence with spread to supraclavicular  and retroperitoneal lymph nodes. Biopsy confirmed poorly differentiated squamous cell carcinoma. Previous treatment with mitomycin  and 5-FU showed good response in primary and inguinal lymph nodes but not in metastatic sites. Current stage IV due to distant lymph node involvement. - Initiate chemotherapy with carboplatin and paclitaxel. -I recommend adding ZYNYZ immunotherapy based on phase III trial data showing improved overall survival, and NCCN guideline. - Emphasize treatment aims for disease control, not cure, with potential for long-term survival in a minority of cases. - Discuss side effects including fatigue, low appetite, nausea, diarrhea, neuropathy, and immunotherapy-related organ effects. - Emphasize importance of quality of life and potential need for dose adjustments based on tolerance. - Discuss potential for disease stabilization in two-thirds of patients and overall survival benefit of six months with immunotherapy addition. - Alternative treatment FOLFOX not recommended due to intolerance to 5-FU (chest pain). - Schedule port placement on November 3rd. - Begin treatment the week of November 3rd. - Patient requests second opinion at [facility], I will refer to Dr. Jia at [facility] for potential clinical trials. - Perform tumor sequencing for potential targeted therapy options. - Schedule scans every three months to assess treatment efficacy.  Ulcerative colitis Currently managed with infusions every eight weeks. Potential interaction with immunotherapy as it may exacerbate autoimmune conditions. -We discussed the possibility of flare of her colitis if we started her on immunotherapy. - Continue current ulcerative colitis infusions. - Consult with Dr. Marinda, GI specialist, regarding management during immunotherapy.  Anxiety disorder Experiencing anxiety with symptoms of hand tremors and stomach discomfort. - Prescribe Xanax as needed for anxiety. - Monitor anxiety symptoms and adjust  treatment as necessary.  Plan - I personally reviewed her multiple scan images, biopsy results with the patient and her children. -  I again discussed the benefit and potential side effect of chemotherapy and immunotherapy, she agrees to proceed - Per her request, I will refer her to [facility] medical oncologist Dr. Jia for second opinion and clinical trial options - She is scheduled for port placement in 2 weeks and plan to start chemo after that.  Plan -I have reviewed her case with her GI Dr. Marinda regarding immunotherapy and her treatment of ulcerative colitis.  We recommend her to continue Entyvio  which is a monoclonal antibody that targets the specific inflammation in the GI track and hopefully will minimize the GI side effects from immunotherapy Zynyz.  I think it is okay for her to try [facility] and will monitor her UC closely when she is on treatment.    SUMMARY OF ONCOLOGIC HISTORY: Oncology History  Anal squamous cell carcinoma (HCC)  12/29/2023 Pathology Results   A. PERIANAL, RIGHT POSTERIOR, BIOPSY:       Invasive squamous cell carcinoma, moderately differentiated. Suspicious for lymphovascular invasion.  B. PERIANAL, LEFT POSTERIOR, BIOPSY:       High-grade squamous intraepithelial lesion (HSIL / AIN 3).  No evidence of definitive invasion.    01/20/2024 Initial Diagnosis   Anal cancer (HCC)   01/26/2024 Cancer Staging   Staging form: Anus, AJCC 8th Edition - Clinical: Stage IIIA (cT2, cN1c, cM0) - Signed by Lanny Callander, MD on 01/26/2024   01/31/2024 - 02/28/2024 Chemotherapy   Patient is on Treatment Plan : ANUS Mitomycin  D1,28 + 5FU D1-4, 28-31 q32d     07/26/2024 -  Chemotherapy   Patient is on Treatment Plan : ANUS Retifanlimab + Paclitaxel (80) + Carboplatin (5) / Retifanlimab        Discussed the use of AI scribe software for clinical note transcription with the patient, who gave verbal consent to proceed.  History of Present Illness Andrea [name] is a 68 year old female with  metastatic recurrence of anal cancer who presents for follow-up.  She has metastasis to the lymph nodes, including left supraclavicular and retroperitoneal nodes. A biopsy on October 16 confirmed poorly differentiated squamous cell carcinoma. Previous chemotherapy and radiation showed a great response in the primary tumor and inguinal lymph nodes, but the disease progressed in other areas. A PET scan on September 22 showed resolution of the primary rectal tumor and inguinal lymph nodes, with new findings in retroperitoneal and left supraclavicular lymph nodes.  Her chemotherapy regimen included mitomycin  and 5FU, which did not completely eradicate cancer cells, leading to disease progression after treatment cessation. She experienced a coronary artery spasm with 5FU, potentially limiting the full course of treatment.  She is on medications for ulcerative colitis, including immunosuppressive drugs, with infusions every eight weeks. There is concern about interactions between these medications and potential new cancer treatments.  She experiences anxiety with hand tremors and a 'jumpy' stomach, managed with Xanax. She reports fatigue, low appetite, and anxiety. She takes medications for cholesterol and anxiety.     All other systems were reviewed with the patient and are negative.  MEDICAL HISTORY:  Past Medical History:  Diagnosis Date   Anemia    Anginal pain    Arthritis    Chest discomfort    Disorder of immune system    Dyspnea    Dyspnea on exertion    Hypercholesterolemia    Hypertension    Migraine    Non-ulcer dyspepsia    Osteopenia of spine    Peptic ulcer disease    Pneumonia  Polyp of rectum    cancerous   Pre-diabetes    Skin cancer    Ulcerative colitis (HCC)     SURGICAL HISTORY: Past Surgical History:  Procedure Laterality Date   ANKLE FRACTURE SURGERY Left    CARDIAC CATHETERIZATION     COLONOSCOPY     ESOPHAGOGASTRODUODENOSCOPY     hip replacement  Bilateral    LEFT HEART CATH AND CORONARY ANGIOGRAPHY N/A 01/21/2022   Procedure: LEFT HEART CATH AND CORONARY ANGIOGRAPHY;  Surgeon: Verlin Lonni BIRCH, MD;  Location: [facility];  Service: Cardiovascular;  Laterality: N/A;   LEFT HEART CATH AND CORONARY ANGIOGRAPHY N/A 02/03/2024   Procedure: LEFT HEART CATH AND CORONARY ANGIOGRAPHY;  Surgeon: Darron Deatrice LABOR, MD;  Location: [facility];  Service: Cardiovascular;  Laterality: N/A;   RECTAL BIOPSY N/A 05/04/2022   Procedure: BIOPSY RECTAL VS EXCISION OF PERIANAL LESION UNDER ANOSCOPY;  Surgeon: Teresa Lonni HERO, MD;  Location: [facility];  Service: General;  Laterality: N/A;   TOTAL KNEE ARTHROPLASTY Left 10/19/2022   Procedure: LEFT TOTAL KNEE ARTHROPLASTY;  Surgeon: Jerri Kay HERO, MD;  Location: [facility] OR;  Service: Orthopedics;  Laterality: Left;   TUBAL LIGATION     TUMOR EXCISION N/A 01/14/2024   Procedure: TRANSRECTAL TUMOR EXCISION;  Surgeon: Teresa Lonni HERO, MD;  Location: [facility];  Service: General;  Laterality: N/A;  Transanal excision of distal rectal and anal lesion (wide local)    I have reviewed the social history and family history with the patient and they are unchanged from previous note.  ALLERGIES:  is allergic to codeine.  MEDICATIONS:  Current Outpatient Medications  Medication Sig Dispense Refill   ALPRAZolam (XANAX) 0.25 MG tablet Take 1 tablet (0.25 mg total) by mouth 2 (two) times daily as needed for anxiety. 30 tablet 0   acetaminophen  (TYLENOL ) 500 MG tablet Take 1,000 mg by mouth every 6 (six) hours as needed for moderate pain.     albuterol  (VENTOLIN  HFA) 108 (90 Base) MCG/ACT inhaler Inhale 1-2 puffs into the lungs every 4 (four) hours as needed for wheezing or shortness of breath. 8 g 0   amLODipine  (NORVASC ) 5 MG tablet Take 1 tablet (5 mg total) by mouth daily. 90 tablet 3   ammonium lactate (LAC-HYDRIN) 12 % lotion Apply 1 Application topically daily.     atorvastatin   (LIPITOR) 80 MG tablet TAKE ONE TABLET BY MOUTH DAILY 90 tablet 1   ezetimibe  (ZETIA ) 10 MG tablet TAKE 1 TABLET BY MOUTH DAILY 90 tablet 3   famotidine  (PEPCID ) 20 MG tablet Take 1 tablet (20 mg total) by mouth 2 (two) times daily as needed for heartburn or indigestion. 60 tablet 1   fluticasone  (FLONASE ) 50 MCG/ACT nasal spray Place 2 sprays into both nostrils as needed for allergies or rhinitis.     hydrocortisone  (ANUSOL -HC) 25 MG suppository Place 1 suppository (25 mg total) rectally 2 (two) times daily. 12 suppository 2   lidocaine -prilocaine  (EMLA ) cream Apply to affected area once 30 g 3   nitroGLYCERIN  (NITROSTAT ) 0.4 MG SL tablet Place 1 tablet (0.4 mg total) under the tongue every 5 (five) minutes as needed for chest pain. 20 tablet 12   ondansetron  (ZOFRAN ) 8 MG tablet Take 1 tablet (8 mg total) by mouth every 8 (eight) hours as needed for nausea or vomiting. Start on the third day after carboplatin. 30 tablet 1   oxyCODONE  (OXY IR/ROXICODONE ) 5 MG immediate release tablet Take 0.5-1 tablets (2.5-5 mg total)  by mouth every 4 (four) hours as needed for severe pain (pain score 7-10). 60 tablet 0   PARoxetine  (PAXIL ) 20 MG tablet Take 10 mg by mouth daily.     prochlorperazine  (COMPAZINE ) 10 MG tablet Take 1 tablet (10 mg total) by mouth every 6 (six) hours as needed for nausea or vomiting. 30 tablet 1   silver  sulfADIAZINE  (SILVADENE ) 1 % cream Apply 1 Application topically 3 (three) times daily as needed. Apply to buttocks area as needed 50 g 1   SUMAtriptan (IMITREX) 25 MG tablet Take 25 mg by mouth every 2 (two) hours as needed for migraine.     vedolizumab  (ENTYVIO ) 300 MG injection Inject 300 mg into the vein every 8 (eight) weeks.     No current facility-administered medications for this visit.    PHYSICAL EXAMINATION: ECOG PERFORMANCE STATUS: 0 - Asymptomatic  Vitals:   07/13/24 1020  BP: 138/78  Pulse: (!) 104  Resp: 17  Temp: 98.3 F (36.8 C)  SpO2: 99%   Wt Readings  from Last 3 Encounters:  07/13/24 162 lb 8 oz (73.7 kg)  07/11/24 162 lb 12.8 oz (73.8 kg)  06/22/24 160 lb 11.2 oz (72.9 kg)     GENERAL:alert, no distress and comfortable SKIN: skin color, texture, turgor are normal, no rashes or significant lesions EYES: normal, Conjunctiva are pink and non-injected, sclera clear Musculoskeletal:no cyanosis of digits and no clubbing  NEURO: alert & oriented x 3 with fluent speech, no focal motor/sensory deficits  Physical Exam    LABORATORY DATA:  I have reviewed the data as listed    Latest Ref Rng & Units 06/15/2024    9:36 AM 04/18/2024    9:27 AM 03/21/2024    9:36 AM  CBC  WBC 4.0 - 10.5 K/uL 3.5  4.0  3.7   Hemoglobin 12.0 - 15.0 g/dL 89.6  9.7  8.9   Hematocrit 36.0 - 46.0 % 30.8  29.0  25.4   Platelets 150 - 400 K/uL 180  203  148         Latest Ref Rng & Units 06/15/2024    9:36 AM 04/18/2024    9:27 AM 03/21/2024    9:36 AM  CMP  Glucose 70 - 99 mg/dL 890  896  879   BUN 8 - 23 mg/dL 17  13  10    Creatinine 0.44 - 1.00 mg/dL 9.14  9.13  9.21   Sodium 135 - 145 mmol/L 140  141  141   Potassium 3.5 - 5.1 mmol/L 4.0  4.3  3.5   Chloride 98 - 111 mmol/L 111  110  109   CO2 22 - 32 mmol/L 26  26  24    Calcium  8.9 - 10.3 mg/dL 9.2  9.2  8.9   Total Protein 6.5 - 8.1 g/dL 6.5  6.7  6.1   Total Bilirubin 0.0 - 1.2 mg/dL 0.4  0.6  0.4   Alkaline Phos 38 - 126 U/L 68  71  61   AST 15 - 41 U/L 18  20  19    ALT 0 - 44 U/L 11  11  13        RADIOGRAPHIC STUDIES: I have personally reviewed the radiological images as listed and agreed with the findings in the report. No results found.    No orders of the defined types were placed in this encounter.  All questions were answered. The patient knows to call the clinic with any problems, questions or concerns. No  barriers to learning was detected. The total time spent in the appointment was 55 minutes, including review of chart and various tests results, discussions about plan of care  and coordination of care plan     Onita Mattock, MD 07/13/2024

## 2024-07-13 NOTE — Telephone Encounter (Signed)
 Per Dr. Lanny - Faxed office visit note, PET report, Biopsy report, patient demo/insurance, and driver's license to Referral Coordinator @[facility]  Dr. Campbell Kilts T# [id number] F# [phone]. Confirmation Received.

## 2024-07-17 ENCOUNTER — Ambulatory Visit: Admitting: Physical Therapy

## 2024-07-17 ENCOUNTER — Telehealth: Payer: Self-pay | Admitting: Physical Therapy

## 2024-07-17 NOTE — Telephone Encounter (Signed)
 PT called pt about this morning's appointment at 1230. PT spoke to pt and pt didn't have this appointment down and needs to cancel her next appointment due to medical needs. Pt will call back for rescheduling when able.    Darryle Navy, PT, DPT 07/18/2511:50 PM  [facility] [address], [address] [place], [place] [address] Phone # [phone] Fax [phone]

## 2024-07-19 ENCOUNTER — Telehealth: Payer: Self-pay | Admitting: Gastroenterology

## 2024-07-19 NOTE — Telephone Encounter (Signed)
 Patient called to update that she has been diagnosed as Stage 4 and will be starting chemotherapy and immunotherapy. Her question is, should she pause the Entyvio  during  these new treatments. Please advise. Thank you.

## 2024-07-19 NOTE — Telephone Encounter (Signed)
 Inbound call from patient requesting a call to discuss concerns she has regarding medical treatment. Patient did not wish to discuss further detail with me. Please advise, thank you

## 2024-07-19 NOTE — Telephone Encounter (Signed)
 Thanks for the note - her oncologist Dr Lanny also contacted me with this question recently.  It is unknown whether or not the immunotherapy will affect her inflammatory bowel disease.   Entyvio  does not cause systemic immune suppression the way anti TNF therapy (like her prior infliximab ) can cause.  I recommend that Calani continue the Entyvio  for her colitis.  - Dr. Legrand

## 2024-07-20 ENCOUNTER — Other Ambulatory Visit: Payer: Self-pay | Admitting: Radiology

## 2024-07-20 NOTE — Progress Notes (Addendum)
 Pharmacist Chemotherapy Monitoring - Initial Assessment    Anticipated start date: 07/27/24   The following has been reviewed per standard work regarding the patient's treatment regimen: The patient's diagnosis, treatment plan and drug doses, and organ/hematologic function Lab orders and baseline tests specific to treatment regimen  The treatment plan start date, drug sequencing, and pre-medications Prior authorization status  Patient's documented medication list, including drug-drug interaction screen and prescriptions for anti-emetics and supportive care specific to the treatment regimen The drug concentrations, fluid compatibility, administration routes, and timing of the medications to be used The patient's access for treatment and lifetime cumulative dose history, if applicable  The patient's medication allergies and previous infusion related reactions, if applicable   Changes made to treatment plan:  treatment plan date  Follow up needed:  1) Labs and day of treatment   Bridgett Leotis Helling, RPH, BCPS, BCOP 07/20/2024   12:06 PM

## 2024-07-20 NOTE — H&P (Signed)
 Chief Complaint: Metastatic squamous cell carcinoma of the anus;  referred for Port-A-Cath placement to assist with treatment  Referring Provider(s): Feng,Y  Supervising Physician: Jenna Hacker  Patient Status: [facility] - Out-pt  History of Present Illness: [name] is a 68 y.o. female with past medical history significant for anemia, arthritis, hyperlipidemia, hypertension, migraine headaches, peptic ulcer disease, skin cancer, ulcerative colitis and anal cancer originally diagnosed in 2023 with prior chemoradiation.  Recent biopsy on 07/06/2024 revealed metastatic squamous cell carcinoma of the anus with lymph node involvement in the cervical and intra-abdominal regions.  She is scheduled today for Port-A-Cath placement to assist with treatment.  She is known to IR team from PICC placements on 01/31/2024 and 02/28/2024 along with left supraclavicular lymph node biopsy on 07/06/2024.   Patient is Full Code  Past Medical History:  Diagnosis Date   Anemia    Anginal pain    Arthritis    Chest discomfort    Disorder of immune system    Dyspnea    Dyspnea on exertion    Hypercholesterolemia    Hypertension    Migraine    Non-ulcer dyspepsia    Osteopenia of spine    Peptic ulcer disease    Pneumonia    Polyp of rectum    cancerous   Pre-diabetes    Skin cancer    Ulcerative colitis (HCC)     Past Surgical History:  Procedure Laterality Date   ANKLE FRACTURE SURGERY Left    CARDIAC CATHETERIZATION     COLONOSCOPY     ESOPHAGOGASTRODUODENOSCOPY     hip replacement Bilateral    LEFT HEART CATH AND CORONARY ANGIOGRAPHY N/A 01/21/2022   Procedure: LEFT HEART CATH AND CORONARY ANGIOGRAPHY;  Surgeon: Verlin Lonni JONETTA, MD;  Location: [facility];  Service: Cardiovascular;  Laterality: N/A;   LEFT HEART CATH AND CORONARY ANGIOGRAPHY N/A 02/03/2024   Procedure: LEFT HEART CATH AND CORONARY ANGIOGRAPHY;  Surgeon: Darron Deatrice LABOR, MD;  Location: [facility]  [facility];  Service: Cardiovascular;  Laterality: N/A;   RECTAL BIOPSY N/A 05/04/2022   Procedure: BIOPSY RECTAL VS EXCISION OF PERIANAL LESION UNDER ANOSCOPY;  Surgeon: Teresa Lonni HERO, MD;  Location: [facility] ORS;  Service: General;  Laterality: N/A;   TOTAL KNEE ARTHROPLASTY Left 10/19/2022   Procedure: LEFT TOTAL KNEE ARTHROPLASTY;  Surgeon: Jerri Kay HERO, MD;  Location: [facility] OR;  Service: Orthopedics;  Laterality: Left;   TUBAL LIGATION     TUMOR EXCISION N/A 01/14/2024   Procedure: TRANSRECTAL TUMOR EXCISION;  Surgeon: Teresa Lonni HERO, MD;  Location: [facility];  Service: General;  Laterality: N/A;  Transanal excision of distal rectal and anal lesion (wide local)    Allergies: Codeine  Medications: Prior to Admission medications   Medication Sig Start Date End Date Taking? Authorizing Provider  acetaminophen  (TYLENOL ) 500 MG tablet Take 1,000 mg by mouth every 6 (six) hours as needed for moderate pain.    [provider]  albuterol  (VENTOLIN  HFA) 108 (90 Base) MCG/ACT inhaler Inhale 1-2 puffs into the lungs every 4 (four) hours as needed for wheezing or shortness of breath. 06/20/23   Antonette Angeline ORN, NP  ALPRAZolam (XANAX) 0.25 MG tablet Take 1 tablet (0.25 mg total) by mouth 2 (two) times daily as needed for anxiety. 07/13/24   Lanny Callander, MD  amLODipine  (NORVASC ) 5 MG tablet Take 1 tablet (5 mg total) by mouth daily. 04/28/24   Zenaida Morene PARAS, MD  ammonium lactate (  LAC-HYDRIN) 12 % lotion Apply 1 Application topically daily.    [provider]  atorvastatin  (LIPITOR) 80 MG tablet TAKE ONE TABLET BY MOUTH DAILY 02/04/23   Shlomo Wilbert SAUNDERS, MD  ezetimibe  (ZETIA ) 10 MG tablet TAKE 1 TABLET BY MOUTH DAILY 04/20/24   Shlomo Wilbert SAUNDERS, MD  famotidine  (PEPCID ) 20 MG tablet Take 1 tablet (20 mg total) by mouth 2 (two) times daily as needed for heartburn or indigestion. 03/06/24   Boscia, Heather E, NP  fluticasone  (FLONASE ) 50 MCG/ACT nasal spray Place 2 sprays into  both nostrils as needed for allergies or rhinitis.    [provider]  hydrocortisone  (ANUSOL -HC) 25 MG suppository Place 1 suppository (25 mg total) rectally 2 (two) times daily. 04/24/24   Lanell Donald Stagger, PA-C  lidocaine -prilocaine  (EMLA ) cream Apply to affected area once 07/12/24   Lanny Callander, MD  nitroGLYCERIN  (NITROSTAT ) 0.4 MG SL tablet Place 1 tablet (0.4 mg total) under the tongue every 5 (five) minutes as needed for chest pain. 03/03/24   Cloretta Arley NOVAK, MD  ondansetron  (ZOFRAN ) 8 MG tablet Take 1 tablet (8 mg total) by mouth every 8 (eight) hours as needed for nausea or vomiting. Start on the third day after carboplatin. 07/12/24   Lanny Callander, MD  oxyCODONE  (OXY IR/ROXICODONE ) 5 MG immediate release tablet Take 0.5-1 tablets (2.5-5 mg total) by mouth every 4 (four) hours as needed for severe pain (pain score 7-10). 03/09/24   Lanell Donald Stagger, PA-C  PARoxetine  (PAXIL ) 20 MG tablet Take 10 mg by mouth daily.    [provider]  prochlorperazine  (COMPAZINE ) 10 MG tablet Take 1 tablet (10 mg total) by mouth every 6 (six) hours as needed for nausea or vomiting. 07/12/24   Lanny Callander, MD  silver  sulfADIAZINE  (SILVADENE ) 1 % cream Apply 1 Application topically 3 (three) times daily as needed. Apply to buttocks area as needed 03/21/24   Lanny Callander, MD  SUMAtriptan (IMITREX) 25 MG tablet Take 25 mg by mouth every 2 (two) hours as needed for migraine.    [provider]  vedolizumab  (ENTYVIO ) 300 MG injection Inject 300 mg into the vein every 8 (eight) weeks.    [provider]     Family History  Problem Relation Age of Onset   CAD Mother 79   Heart attack Mother    Heart attack Father 29   CAD Brother 69   Skin cancer Brother    Other Son        gluten sensitivity   Colon cancer Neg Hx    Esophageal cancer Neg Hx    Rectal cancer Neg Hx    Stomach cancer Neg Hx     Social History   Socioeconomic History   Marital status: Divorced    Spouse  name: Not on file   Number of children: 3   Years of education: Not on file   Highest education level: Not on file  Occupational History   Occupation: retired  Tobacco Use   Smoking status: Never   Smokeless tobacco: Never  Vaping Use   Vaping status: Never Used  Substance and Sexual Activity   Alcohol use: No   Drug use: No   Sexual activity: Yes  Other Topics Concern   Not on file  Social History Narrative   Not on file   Social Drivers of Health   Financial Resource Strain: Low Risk  (07/18/2024)   Received from [facility]   Overall Financial Resource Strain (CARDIA)  How hard is it for you to pay for the very basics like food, housing, medical care, and heating?: Not hard at all  Food Insecurity: No Food Insecurity (07/18/2024)   Received from [facility]   Hunger Vital Sign    Within the past 12 months, you worried that your food would run out before you got the money to buy more.: Never true    Within the past 12 months, the food you bought just didn't last and you didn't have money to get more.: Never true  Transportation Needs: No Transportation Needs (07/18/2024)   Received from [facility] - Transportation    Lack of Transportation (Medical): No    Lack of Transportation (Non-Medical): No  Physical Activity: Not on file  Stress: Not on file  Social Connections: Moderately Integrated (02/28/2024)   Social Connection and Isolation Panel    Frequency of Communication with Friends and Family: More than three times a week    Frequency of Social Gatherings with Friends and Family: More than three times a week    Attends Religious Services: More than 4 times per year    Active Member of [employer] or Organizations: Yes    Attends Engineer, Structural: More than 4 times per year    Marital Status: Divorced       Review of Systems: denies fever, HA,CP,dyspnea, abd/back pain,N/V or bleeding; she does have occ cough  Vital Signs: Vitals:    07/24/24 0743  BP: 139/67  Pulse: 82  Resp: 16  Temp: 98.8 F (37.1 C)  SpO2: 98%      Advance Care Plan: no documents on file    Physical Exam: awake/alert; chest- CTA bilat; heart- RRR; abd-soft,+BS,NT; no sig LE edema  Imaging: US  CORE BIOPSY (LYMPH NODES) Result Date: 07/06/2024 INDICATION: Mildly progressive hypermetabolic retroperitoneal adenopathy in the low abdomen and false pelvis, suspicious for metastatic disease. New small left supraclavicular lymph node with low level metabolic activity. History of anal cancer. EXAM: ULTRASOUND-GUIDED LEFT SUPRACLAVICULAR CERVICAL LYMPH NODE BIOPSY COMPARISON:  PET-CT, 06/12/2024.  CTA chest, 02/02/2024. MEDICATIONS: None ANESTHESIA/SEDATION: Local anesthetic was administered. COMPLICATIONS: None immediate. TECHNIQUE: Informed written consent was obtained from the patient after a discussion of the risks, benefits and alternatives to treatment. Questions regarding the procedure were encouraged and answered. Initial ultrasound scanning demonstrated an enlarged LEFT supraclavicular cervical lymph node. An ultrasound image was saved for documentation purposes. The procedure was planned. A timeout was performed prior to the initiation of the procedure. The operative was prepped and draped in the usual sterile fashion, and a sterile drape was applied covering the operative field. A timeout was performed prior to the initiation of the procedure. Local anesthesia was provided with 1% lidocaine  with epinephrine . Under direct ultrasound guidance, an 18 gauge core needle device was utilized to obtain to obtain 3 core needle biopsies of the LEFT supraclavicular cervical lymph node. The samples were placed in saline and submitted to pathology. The needle was removed and superficial hemostasis was achieved with manual compression. Post procedure scan was negative for significant hematoma. A dressing was applied. The patient tolerated the procedure well without  immediate postprocedural complication. IMPRESSION: Successful ultrasound guided biopsy of an enlarged LEFT supraclavicular cervical lymph node. Thom Hall, MD [facility] Electronically Signed   By: Thom Hall M.D.   On: 07/06/2024 16:25    Labs:  CBC: Recent Labs    03/14/24 1006 03/21/24 0936 04/18/24 9072  06/15/24 0936  WBC 2.3* 3.7* 4.0 3.5*  HGB 8.5* 8.9* 9.7* 10.3*  HCT 24.4* 25.4* 29.0* 30.8*  PLT 125* 148* 203 180    COAGS: No results for input(s): INR, APTT in the last 8760 hours.  BMP: Recent Labs    03/14/24 1006 03/21/24 0936 04/18/24 0927 06/15/24 0936  NA 138 141 141 140  K 3.5 3.5 4.3 4.0  CL 104 109 110 111  CO2 28 24 26 26   GLUCOSE 126* 120* 103* 109*  BUN 12 10 13 17   CALCIUM  8.7* 8.9 9.2 9.2  CREATININE 1.03* 0.78 0.86 0.85  GFRNONAA 59* >60 >60 >60    LIVER FUNCTION TESTS: Recent Labs    03/14/24 1006 03/21/24 0936 04/18/24 0927 06/15/24 0936  BILITOT 0.4 0.4 0.6 0.4  AST 13* 19 20 18   ALT 10 13 11 11   ALKPHOS 48 61 71 68  PROT 5.9* 6.1* 6.7 6.5  ALBUMIN 3.2* 3.4* 3.6 3.9    TUMOR MARKERS: Recent Labs    03/14/24 1006 03/21/24 0936 04/18/24 0927 06/15/24 0936  CEA 5.10* 6.09* 3.65 2.57    Assessment and Plan: 68 y.o. female with past medical history significant for anemia, arthritis, hyperlipidemia, hypertension, migraine headaches, peptic ulcer disease, skin cancer, ulcerative colitis and anal cancer originally diagnosed in 2023 with prior chemoradiation.  Recent biopsy on 07/06/2024 revealed metastatic squamous cell carcinoma of the anus with lymph node involvement in the cervical and intra-abdominal regions.  She is scheduled today for Port-A-Cath placement to assist with treatment.  She is known to IR team from PICC placements on 01/31/2024 and 02/28/2024 along with left supraclavicular lymph node biopsy on 07/06/2024.Risks and benefits of image guided  port-a-catheter placement was discussed with the patient including, but not limited to bleeding, infection, pneumothorax, or fibrin sheath development and need for additional procedures.  All of the patient's questions were answered, patient is agreeable to proceed. Consent signed and in chart.    Thank you for allowing our service to participate in Andrea [name] 's care.  Electronically Signed: D. Franky Rakers, PA-C   07/20/2024, 4:28 PM      I spent a total of 20 minutes    in face to face in clinical consultation, greater than 50% of which was counseling/coordinating care for port a cath placement

## 2024-07-20 NOTE — Telephone Encounter (Signed)
 Spoke with patient and provided Dr Legrand' recommendation. Patient voiced understanding.

## 2024-07-24 ENCOUNTER — Encounter: Payer: Self-pay | Admitting: Radiology

## 2024-07-24 ENCOUNTER — Ambulatory Visit: Admitting: Physical Therapy

## 2024-07-24 ENCOUNTER — Ambulatory Visit (HOSPITAL_COMMUNITY)
Admission: RE | Admit: 2024-07-24 | Discharge: 2024-07-24 | Disposition: A | Discharge status: Home / Self Care | Source: Ambulatory Visit | Attending: Hematology | Admitting: Hematology

## 2024-07-24 ENCOUNTER — Other Ambulatory Visit: Payer: Self-pay

## 2024-07-24 ENCOUNTER — Encounter (HOSPITAL_COMMUNITY): Payer: Self-pay

## 2024-07-24 DIAGNOSIS — Z85828 Personal history of other malignant neoplasm of skin: Secondary | ICD-10-CM | POA: Insufficient documentation

## 2024-07-24 DIAGNOSIS — Z923 Personal history of irradiation: Secondary | ICD-10-CM | POA: Diagnosis not present

## 2024-07-24 DIAGNOSIS — E785 Hyperlipidemia, unspecified: Secondary | ICD-10-CM | POA: Diagnosis not present

## 2024-07-24 DIAGNOSIS — Z9221 Personal history of antineoplastic chemotherapy: Secondary | ICD-10-CM | POA: Diagnosis not present

## 2024-07-24 DIAGNOSIS — Z79899 Other long term (current) drug therapy: Secondary | ICD-10-CM | POA: Diagnosis not present

## 2024-07-24 DIAGNOSIS — C210 Malignant neoplasm of anus, unspecified: Secondary | ICD-10-CM | POA: Insufficient documentation

## 2024-07-24 DIAGNOSIS — I10 Essential (primary) hypertension: Secondary | ICD-10-CM | POA: Diagnosis not present

## 2024-07-24 HISTORY — PX: IR IMAGING GUIDED PORT INSERTION: IMG5740

## 2024-07-24 MED ORDER — FENTANYL CITRATE (PF) 100 MCG/2ML IJ SOLN
INTRAMUSCULAR | Status: AC | PRN
  Administered 2024-07-24 (×2): 50 ug via INTRAVENOUS

## 2024-07-24 MED ORDER — FENTANYL CITRATE (PF) 100 MCG/2ML IJ SOLN
INTRAMUSCULAR | Status: AC
  Filled 2024-07-24: qty 2

## 2024-07-24 MED ORDER — MIDAZOLAM HCL 2 MG/2ML IJ SOLN
INTRAMUSCULAR | Status: AC
  Filled 2024-07-24: qty 2

## 2024-07-24 MED ORDER — MIDAZOLAM HCL (PF) 2 MG/2ML IJ SOLN
INTRAMUSCULAR | Status: AC | PRN
Start: 2024-07-24 — End: 2024-07-24
  Administered 2024-07-24 (×2): 1 mg via INTRAVENOUS

## 2024-07-24 MED ORDER — SODIUM CHLORIDE 0.9 % IV SOLN: INTRAVENOUS | Status: DC

## 2024-07-24 MED ORDER — LIDOCAINE-EPINEPHRINE 1 %-1:100000 IJ SOLN
INTRAMUSCULAR | Status: AC
  Filled 2024-07-24: qty 1

## 2024-07-24 MED ORDER — HEPARIN SOD (PORK) LOCK FLUSH 100 UNIT/ML IV SOLN
INTRAVENOUS | Status: AC
  Filled 2024-07-24: qty 5

## 2024-07-24 MED ORDER — LIDOCAINE-EPINEPHRINE 1 %-1:100000 IJ SOLN
20.0000 mL | Freq: Once | INTRAMUSCULAR | Status: AC
  Administered 2024-07-24: 20 mL via INTRADERMAL

## 2024-07-24 MED ORDER — HEPARIN SOD (PORK) LOCK FLUSH 100 UNIT/ML IV SOLN
500.0000 [IU] | Freq: Once | INTRAVENOUS | Status: AC
  Administered 2024-07-24: 500 [IU] via INTRAVENOUS

## 2024-07-24 NOTE — Discharge Instructions (Signed)
*  rest for the next 24 hours *you can remove the dressing tomorrow and may shower as well *drink plenty of fluid

## 2024-07-24 NOTE — Procedures (Signed)
 Interventional Radiology Procedure Note  Procedure: Chest port placement  Complications: None  Estimated Blood Loss: < 10 mL  Findings: RIJ sl chest port placed.  Tip at cavoatrial junction.  Cordella DELENA Banner, MD

## 2024-07-25 ENCOUNTER — Encounter: Payer: Self-pay | Admitting: Hematology

## 2024-07-26 ENCOUNTER — Encounter: Payer: Self-pay | Admitting: Hematology

## 2024-07-27 ENCOUNTER — Inpatient Hospital Stay

## 2024-07-27 ENCOUNTER — Inpatient Hospital Stay: Attending: Hematology

## 2024-07-27 ENCOUNTER — Inpatient Hospital Stay (HOSPITAL_BASED_OUTPATIENT_CLINIC_OR_DEPARTMENT_OTHER): Admitting: Hematology

## 2024-07-27 VITALS — BP 136/60 | HR 95 | Temp 98.0°F | Resp 19 | Ht 65.0 in | Wt 157.2 lb

## 2024-07-27 VITALS — BP 130/64 | HR 74 | Resp 16

## 2024-07-27 DIAGNOSIS — T451X5A Adverse effect of antineoplastic and immunosuppressive drugs, initial encounter: Secondary | ICD-10-CM | POA: Insufficient documentation

## 2024-07-27 DIAGNOSIS — Z79899 Other long term (current) drug therapy: Secondary | ICD-10-CM | POA: Insufficient documentation

## 2024-07-27 DIAGNOSIS — D701 Agranulocytosis secondary to cancer chemotherapy: Secondary | ICD-10-CM | POA: Diagnosis not present

## 2024-07-27 DIAGNOSIS — C778 Secondary and unspecified malignant neoplasm of lymph nodes of multiple regions: Secondary | ICD-10-CM | POA: Diagnosis present

## 2024-07-27 DIAGNOSIS — Z5111 Encounter for antineoplastic chemotherapy: Secondary | ICD-10-CM | POA: Insufficient documentation

## 2024-07-27 DIAGNOSIS — C210 Malignant neoplasm of anus, unspecified: Secondary | ICD-10-CM | POA: Diagnosis not present

## 2024-07-27 LAB — CBC WITH DIFFERENTIAL (CANCER CENTER ONLY)
Abs Immature Granulocytes: 0.01 K/uL (ref 0.00–0.07)
Basophils Absolute: 0 K/uL (ref 0.0–0.1)
Basophils Relative: 0 %
Eosinophils Absolute: 0.1 K/uL (ref 0.0–0.5)
Eosinophils Relative: 2 %
HCT: 31 % — ABNORMAL LOW (ref 36.0–46.0)
Hemoglobin: 10.4 g/dL — ABNORMAL LOW (ref 12.0–15.0)
Immature Granulocytes: 0 %
Lymphocytes Relative: 18 %
Lymphs Abs: 0.8 K/uL (ref 0.7–4.0)
MCH: 28.4 pg (ref 26.0–34.0)
MCHC: 33.5 g/dL (ref 30.0–36.0)
MCV: 84.7 fL (ref 80.0–100.0)
Monocytes Absolute: 0.4 K/uL (ref 0.1–1.0)
Monocytes Relative: 9 %
Neutro Abs: 3.3 K/uL (ref 1.7–7.7)
Neutrophils Relative %: 71 %
Platelet Count: 222 K/uL (ref 150–400)
RBC: 3.66 MIL/uL — ABNORMAL LOW (ref 3.87–5.11)
RDW: 13.4 % (ref 11.5–15.5)
WBC Count: 4.7 K/uL (ref 4.0–10.5)
nRBC: 0 % (ref 0.0–0.2)

## 2024-07-27 LAB — CMP (CANCER CENTER ONLY)
ALT: 12 U/L (ref 0–44)
AST: 17 U/L (ref 15–41)
Albumin: 3.7 g/dL (ref 3.5–5.0)
Alkaline Phosphatase: 61 U/L (ref 38–126)
Anion gap: 8 (ref 5–15)
BUN: 12 mg/dL (ref 8–23)
CO2: 25 mmol/L (ref 22–32)
Calcium: 9.5 mg/dL (ref 8.9–10.3)
Chloride: 107 mmol/L (ref 98–111)
Creatinine: 0.8 mg/dL (ref 0.44–1.00)
GFR, Estimated: >60 mL/min (ref >60)
Glucose, Bld: 94 mg/dL (ref 70–99)
Potassium: 3.7 mmol/L (ref 3.5–5.1)
Sodium: 140 mmol/L (ref 135–145)
Total Bilirubin: 0.5 mg/dL (ref 0.0–1.2)
Total Protein: 7 g/dL (ref 6.5–8.1)

## 2024-07-27 LAB — TSH: TSH: 1.9 u[IU]/mL (ref 0.350–4.500)

## 2024-07-27 MED ORDER — APREPITANT 130 MG/18ML IV EMUL
130.0000 mg | Freq: Once | INTRAVENOUS | Status: AC
  Administered 2024-07-27: 130 mg via INTRAVENOUS
  Filled 2024-07-27: qty 18

## 2024-07-27 MED ORDER — SODIUM CHLORIDE 0.9 % IV SOLN: INTRAVENOUS | Status: DC

## 2024-07-27 MED ORDER — SODIUM CHLORIDE 0.9 % IV SOLN
500.0000 mg | Freq: Once | INTRAVENOUS | Status: AC
  Administered 2024-07-27: 500 mg via INTRAVENOUS
  Filled 2024-07-27: qty 20

## 2024-07-27 MED ORDER — SODIUM CHLORIDE 0.9 % IV SOLN
80.0000 mg/m2 | Freq: Once | INTRAVENOUS | Status: AC
  Administered 2024-07-27: 150 mg via INTRAVENOUS
  Filled 2024-07-27: qty 25

## 2024-07-27 MED ORDER — SODIUM CHLORIDE 0.9 % IV SOLN
489.0000 mg | Freq: Once | INTRAVENOUS | Status: AC
  Administered 2024-07-27: 490 mg via INTRAVENOUS
  Filled 2024-07-27: qty 49

## 2024-07-27 MED ORDER — ACETAMINOPHEN 325 MG PO TABS
650.0000 mg | ORAL_TABLET | Freq: Once | ORAL | Status: AC
  Administered 2024-07-27: 650 mg via ORAL
  Filled 2024-07-27: qty 2

## 2024-07-27 MED ORDER — PALONOSETRON HCL INJECTION 0.25 MG/5ML
0.2500 mg | Freq: Once | INTRAVENOUS | Status: AC
  Administered 2024-07-27: 0.25 mg via INTRAVENOUS
  Filled 2024-07-27: qty 5

## 2024-07-27 MED ORDER — DIPHENHYDRAMINE HCL 50 MG/ML IJ SOLN
25.0000 mg | Freq: Once | INTRAMUSCULAR | Status: AC
  Administered 2024-07-27: 25 mg via INTRAVENOUS
  Filled 2024-07-27: qty 1

## 2024-07-27 MED ORDER — FAMOTIDINE IN NACL 20-0.9 MG/50ML-% IV SOLN
20.0000 mg | Freq: Once | INTRAVENOUS | Status: AC
  Administered 2024-07-27: 20 mg via INTRAVENOUS
  Filled 2024-07-27: qty 50

## 2024-07-27 NOTE — Progress Notes (Signed)
 [facility]   [phone] [phone]   Clinic Follow up Note   Patient Care Team: Marvene Prentice SAUNDERS, FNP as PCP - General (Family Medicine) Shlomo Wilbert SAUNDERS, MD as PCP - Cardiology (Cardiology) Ardis Evalene CROME, RN as Oncology Nurse Navigator Lanny Callander, MD as Consulting Physician (Hematology and Oncology) Legrand Victory CROME DOUGLAS, MD as Consulting Physician (Gastroenterology)  Date of Service:  07/27/2024  CHIEF COMPLAINT: f/u of recurrent anal cancer  CURRENT THERAPY:  First-line chemotherapy carbo, paclitaxel and Zynyz   Oncology History   Anal squamous cell carcinoma (HCC) cT2N1M0, stage IIIA -previous history of superficially invasive squamous cell carcinoma of anus, status post surgical resection in August 2023, presented with rectal discomfort for several months, and a palpable 2 to 3 cm mass in the anal canal, biopsy confirmed invasive squamous cell carcinoma in April 2025.  -PET Jan 25, 2024 showed long segment anorectal hypermetabolic activity, and hypermetabolic right inguinal and external iliac lymph nodes metastasis.  No distant metastasis. -she started concurrent chemoradiation with 5-FU and mitomycin  on Jan 31, 2024. -she was hospitalized for pericarditis on 02/02/2024, clinically suspicious for 5-fu induced coronary artery spasm. Chemo held on day 4 -She was admitted to hospital for cycle 2 chemotherapy with cardiology on board, she still developed a significant recurrent chest pain and 5-FU infusion was stopped on day 4 -RT completed on 03/16/24 -PET 06/12/2024 showed resolved hypermetabolic activity in the anus and right inguinal and pelvic lymph nodes, however she has mildly progressive hypermetabolic retroperitoneal and new left [place] adenopathy. Biopsy of the left [place] node confirmed metastatic anal cancer.  -I recommend chemo carbo taxol and immunotherapy zynyz  Assessment & Plan Recurrent anal cancer Undergoing treatment with chemotherapy and  immunotherapy. Initial week of treatment is most challenging due to two chemotherapy drugs, carboplatin and paclitaxel, and one immunotherapy drug. Anticipated side effects include fatigue, low appetite, and potential dehydration. Blood counts are stable with normal white count, hemoglobin at 10.4, and normal platelet count. Chemotherapy dose will be adjusted based on lab results, including kidney and liver function, height, and weight. - Administered premedications: Benadryl , Pepcid , Aloxi, and Emand. - Administered chemotherapy: carboplatin and paclitaxel. - Administered immunotherapy on the first day of each cycle. - Monitor for side effects: fatigue, low appetite, and dehydration. - Encouraged hydration and nutrition, especially if experiencing diarrhea. - Scheduled follow-up appointments closely during the first cycle, then every other week if stable. - Will repeat scan after three cycles to assess treatment efficacy.  Ulcerative colitis in remission Ulcerative colitis is in remission. Risk of flare-up with immunotherapy treatment. Current medication is specific to the colon and may mitigate immunotherapy-related issues. Baseline bowel movements are soft and occur three times a day. - Monitor bowel movements and cramps. - Advised to report if diarrhea worsens or occurs more than five to six times a day. - Encouraged hydration, especially if experiencing diarrhea. - Continue current colitis medication.   Plan - Lab reviewed, adequate for treatment, will proceed for cycle carboplatin, paclitaxel and Zynyz today - Lab, follow-up and paclitaxel for next 2 weeks - I will order p16 on her biopsy  SUMMARY OF ONCOLOGIC HISTORY: Oncology History  Anal squamous cell carcinoma (HCC)  12/29/2023 Pathology Results   A. PERIANAL, RIGHT POSTERIOR, BIOPSY:       Invasive squamous cell carcinoma, moderately differentiated. Suspicious for lymphovascular invasion.  B. PERIANAL, LEFT POSTERIOR, BIOPSY:        High-grade squamous intraepithelial lesion (HSIL / AIN 3).  No evidence of definitive invasion.    01/20/2024 Initial Diagnosis   Anal cancer (HCC)   01/26/2024 Cancer Staging   Staging form: Anus, AJCC 8th Edition - Clinical: Stage IIIA (cT2, cN1c, cM0) - Signed by Lanny Callander, MD on 01/26/2024   01/31/2024 - 02/28/2024 Chemotherapy   Patient is on Treatment Plan : ANUS Mitomycin  D1,28 + 5FU D1-4, 28-31 q32d     07/27/2024 -  Chemotherapy   Patient is on Treatment Plan : ANUS Retifanlimab + Paclitaxel (80) + Carboplatin (5) / Retifanlimab        Discussed the use of AI scribe software for clinical note transcription with the patient, who gave verbal consent to proceed.  History of Present Illness Andrea [name] is a 68 year old female with recurrent anal cancer who presents for follow-up. She was referred by Dr. Vita for consideration of a clinical trial.  She is currently undergoing chemotherapy and immunotherapy for recurrent anal cancer. Today, she is scheduled to receive two chemotherapy drugs and one immunotherapy drug, with plans to receive one chemotherapy drug in the following two weeks.  She experiences soft, non-diarrheal bowel movements approximately three times a day since radiation therapy, with no recent diarrhea. Her history of colitis remains stable.  She takes Benadryl , Pepcid , Aloxi, and Emend for nausea management. She has ondansetron  at home and recently picked up Xanax from the pharmacy. She is unsure about her supply of Compazine , which was recently prescribed.     All other systems were reviewed with the patient and are negative.  MEDICAL HISTORY:  Past Medical History:  Diagnosis Date   Anemia    Anginal pain    Arthritis    Chest discomfort    Disorder of immune system    Dyspnea    Dyspnea on exertion    Hypercholesterolemia    Hypertension    Migraine    Non-ulcer dyspepsia    Osteopenia of spine    Peptic ulcer disease    Pneumonia    Polyp of  rectum    cancerous   Pre-diabetes    Skin cancer    Ulcerative colitis (HCC)     SURGICAL HISTORY: Past Surgical History:  Procedure Laterality Date   ANKLE FRACTURE SURGERY Left    CARDIAC CATHETERIZATION     COLONOSCOPY     ESOPHAGOGASTRODUODENOSCOPY     hip replacement Bilateral    IR IMAGING GUIDED PORT INSERTION  07/24/2024   LEFT HEART CATH AND CORONARY ANGIOGRAPHY N/A 01/21/2022   Procedure: LEFT HEART CATH AND CORONARY ANGIOGRAPHY;  Surgeon: Verlin Lonni JONETTA, MD;  Location: [facility];  Service: Cardiovascular;  Laterality: N/A;   LEFT HEART CATH AND CORONARY ANGIOGRAPHY N/A 02/03/2024   Procedure: LEFT HEART CATH AND CORONARY ANGIOGRAPHY;  Surgeon: Darron Deatrice LABOR, MD;  Location: [facility];  Service: Cardiovascular;  Laterality: N/A;   RECTAL BIOPSY N/A 05/04/2022   Procedure: BIOPSY RECTAL VS EXCISION OF PERIANAL LESION UNDER ANOSCOPY;  Surgeon: Teresa Lonni HERO, MD;  Location: [facility];  Service: General;  Laterality: N/A;   TOTAL KNEE ARTHROPLASTY Left 10/19/2022   Procedure: LEFT TOTAL KNEE ARTHROPLASTY;  Surgeon: Jerri Kay HERO, MD;  Location: [facility];  Service: Orthopedics;  Laterality: Left;   TUBAL LIGATION     TUMOR EXCISION N/A 01/14/2024   Procedure: TRANSRECTAL TUMOR EXCISION;  Surgeon: Teresa Lonni HERO, MD;  Location: [facility];  Service: General;  Laterality: N/A;  Transanal excision of distal rectal and  anal lesion (wide local)    I have reviewed the social history and family history with the patient and they are unchanged from previous note.  ALLERGIES:  is allergic to codeine.  MEDICATIONS:  Current Outpatient Medications  Medication Sig Dispense Refill   acetaminophen  (TYLENOL ) 500 MG tablet Take 1,000 mg by mouth every 6 (six) hours as needed for moderate pain.     albuterol  (VENTOLIN  HFA) 108 (90 Base) MCG/ACT inhaler Inhale 1-2 puffs into the lungs every 4 (four) hours as needed for wheezing or shortness of  breath. 8 g 0   ALPRAZolam (XANAX) 0.25 MG tablet Take 1 tablet (0.25 mg total) by mouth 2 (two) times daily as needed for anxiety. 30 tablet 0   amLODipine  (NORVASC ) 5 MG tablet Take 1 tablet (5 mg total) by mouth daily. 90 tablet 3   ammonium lactate (LAC-HYDRIN) 12 % lotion Apply 1 Application topically daily.     atorvastatin  (LIPITOR) 80 MG tablet TAKE ONE TABLET BY MOUTH DAILY 90 tablet 1   ezetimibe  (ZETIA ) 10 MG tablet TAKE 1 TABLET BY MOUTH DAILY 90 tablet 3   famotidine  (PEPCID ) 20 MG tablet Take 1 tablet (20 mg total) by mouth 2 (two) times daily as needed for heartburn or indigestion. 60 tablet 1   fluticasone  (FLONASE ) 50 MCG/ACT nasal spray Place 2 sprays into both nostrils as needed for allergies or rhinitis.     hydrocortisone  (ANUSOL -HC) 25 MG suppository Place 1 suppository (25 mg total) rectally 2 (two) times daily. 12 suppository 2   lidocaine -prilocaine  (EMLA ) cream Apply to affected area once 30 g 3   nitroGLYCERIN  (NITROSTAT ) 0.4 MG SL tablet Place 1 tablet (0.4 mg total) under the tongue every 5 (five) minutes as needed for chest pain. 20 tablet 12   ondansetron  (ZOFRAN ) 8 MG tablet Take 1 tablet (8 mg total) by mouth every 8 (eight) hours as needed for nausea or vomiting. Start on the third day after carboplatin. 30 tablet 1   oxyCODONE  (OXY IR/ROXICODONE ) 5 MG immediate release tablet Take 0.5-1 tablets (2.5-5 mg total) by mouth every 4 (four) hours as needed for severe pain (pain score 7-10). 60 tablet 0   PARoxetine  (PAXIL ) 20 MG tablet Take 10 mg by mouth daily.     prochlorperazine  (COMPAZINE ) 10 MG tablet Take 1 tablet (10 mg total) by mouth every 6 (six) hours as needed for nausea or vomiting. 30 tablet 1   silver  sulfADIAZINE  (SILVADENE ) 1 % cream Apply 1 Application topically 3 (three) times daily as needed. Apply to buttocks area as needed 50 g 1   SUMAtriptan (IMITREX) 25 MG tablet Take 25 mg by mouth every 2 (two) hours as needed for migraine.     vedolizumab   (ENTYVIO ) 300 MG injection Inject 300 mg into the vein every 8 (eight) weeks.     No current facility-administered medications for this visit.   Facility-Administered Medications Ordered in Other Visits  Medication Dose Route Frequency Provider Last Rate Last Admin   0.9 %  sodium chloride  infusion   Intravenous Continuous Lanny Callander, MD 10 mL/hr at 07/27/24 1205 New Bag at 07/27/24 1205   CARBOplatin (PARAPLATIN) 490 mg in sodium chloride  0.9 % 250 mL chemo infusion  490 mg Intravenous Once Lanny Callander, MD       PACLitaxel (TAXOL) 150 mg in sodium chloride  0.9 % 250 mL chemo infusion (</= 80mg /m2)  80 mg/m2 (Treatment Plan Recorded) Intravenous Once Lanny Callander, MD       retifanlimab-dlwr (ZYNYZ) 500  mg in sodium chloride  0.9 % 100 mL chemo infusion  500 mg Intravenous Once Lanny Callander, MD        PHYSICAL EXAMINATION: ECOG PERFORMANCE STATUS: 1 - Symptomatic but completely ambulatory  Vitals:   07/27/24 1058  BP: 136/60  Pulse: 95  Resp: 19  Temp: 98 F (36.7 C)  SpO2: 94%   Wt Readings from Last 3 Encounters:  07/27/24 157 lb 3.2 oz (71.3 kg)  07/13/24 162 lb 8 oz (73.7 kg)  07/11/24 162 lb 12.8 oz (73.8 kg)     GENERAL:alert, no distress and comfortable SKIN: skin color, texture, turgor are normal, no rashes or significant lesions EYES: normal, Conjunctiva are pink and non-injected, sclera clear NECK: supple, thyroid normal size, non-tender, without nodularity LYMPH:  no palpable lymphadenopathy in the cervical, axillary  LUNGS: clear to auscultation and percussion with normal breathing effort HEART: regular rate & rhythm and no murmurs and no lower extremity edema ABDOMEN:abdomen soft, non-tender and normal bowel sounds Musculoskeletal:no cyanosis of digits and no clubbing  NEURO: alert & oriented x 3 with fluent speech, no focal motor/sensory deficits  Physical Exam    LABORATORY DATA:  I have reviewed the data as listed    Latest Ref Rng & Units 07/27/2024   10:20 AM  06/15/2024    9:36 AM 04/18/2024    9:27 AM  CBC  WBC 4.0 - 10.5 K/uL 4.7  3.5  4.0   Hemoglobin 12.0 - 15.0 g/dL 89.5  89.6  9.7   Hematocrit 36.0 - 46.0 % 31.0  30.8  29.0   Platelets 150 - 400 K/uL 222  180  203         Latest Ref Rng & Units 07/27/2024   10:20 AM 06/15/2024    9:36 AM 04/18/2024    9:27 AM  CMP  Glucose 70 - 99 mg/dL 94  890  896   BUN 8 - 23 mg/dL 12  17  13    Creatinine 0.44 - 1.00 mg/dL 9.19  9.14  9.13   Sodium 135 - 145 mmol/L 140  140  141   Potassium 3.5 - 5.1 mmol/L 3.7  4.0  4.3   Chloride 98 - 111 mmol/L 107  111  110   CO2 22 - 32 mmol/L 25  26  26    Calcium  8.9 - 10.3 mg/dL 9.5  9.2  9.2   Total Protein 6.5 - 8.1 g/dL 7.0  6.5  6.7   Total Bilirubin 0.0 - 1.2 mg/dL 0.5  0.4  0.6   Alkaline Phos 38 - 126 U/L 61  68  71   AST 15 - 41 U/L 17  18  20    ALT 0 - 44 U/L 12  11  11        RADIOGRAPHIC STUDIES: I have personally reviewed the radiological images as listed and agreed with the findings in the report. No results found.    Orders Placed This Encounter  Procedures   CBC with Differential (Cancer Center Only)    Standing Status:   Future    Expected Date:   08/24/2024    Expiration Date:   08/24/2025   CMP (Cancer Center only)    Standing Status:   Future    Expected Date:   08/24/2024    Expiration Date:   08/24/2025   CBC with Differential (Cancer Center Only)    Standing Status:   Future    Expected Date:   08/31/2024    Expiration Date:  08/31/2025   CMP (Cancer Center only)    Standing Status:   Future    Expected Date:   08/31/2024    Expiration Date:   08/31/2025   CBC with Differential (Cancer Center Only)    Standing Status:   Future    Expected Date:   09/07/2024    Expiration Date:   09/07/2025   CMP (Cancer Center only)    Standing Status:   Future    Expected Date:   09/07/2024    Expiration Date:   09/07/2025   CBC with Differential (Cancer Center Only)    Standing Status:   Future    Expected Date:   09/21/2024     Expiration Date:   09/21/2025   CMP (Cancer Center only)    Standing Status:   Future    Expected Date:   09/21/2024    Expiration Date:   09/21/2025   T4    Standing Status:   Future    Expected Date:   09/21/2024    Expiration Date:   09/21/2025   TSH    Standing Status:   Future    Expected Date:   09/21/2024    Expiration Date:   09/21/2025   CBC with Differential (Cancer Center Only)    Standing Status:   Future    Expected Date:   09/28/2024    Expiration Date:   09/28/2025   CMP (Cancer Center only)    Standing Status:   Future    Expected Date:   09/28/2024    Expiration Date:   09/28/2025   CBC with Differential (Cancer Center Only)    Standing Status:   Future    Expected Date:   10/05/2024    Expiration Date:   10/05/2025   CMP (Cancer Center only)    Standing Status:   Future    Expected Date:   10/05/2024    Expiration Date:   10/05/2025   All questions were answered. The patient knows to call the clinic with any problems, questions or concerns. No barriers to learning was detected. The total time spent in the appointment was 30 minutes, including review of chart and various tests results, discussions about plan of care and coordination of care plan     Onita Mattock, MD 07/27/2024

## 2024-07-27 NOTE — Patient Instructions (Signed)
 [facility] - A DEPT OF [facility]   Discharge Instructions: Thank you for choosing [facility] to provide your oncology and hematology care.   If you have a lab appointment with the Cancer Center, please go directly to the Cancer Center and check in at the registration area.   Wear comfortable clothing and clothing appropriate for easy access to any Portacath or PICC line.   We strive to give you quality time with your provider. You may need to reschedule your appointment if you arrive late (15 or more minutes).  Arriving late affects you and other patients whose appointments are after yours.  Also, if you miss three or more appointments without notifying the office, you may be dismissed from the clinic at the provider's discretion.      For prescription refill requests, have your pharmacy contact our office and allow 72 hours for refills to be completed.    Today you received the following chemotherapy and/or immunotherapy agents: Retifanlimab-dlwr (ZYNYZ), paclitaxel (Taxol),and carboplatin (Paraplatin)       To help prevent nausea and vomiting after your treatment, we encourage you to take your nausea medication as directed.  BELOW ARE SYMPTOMS THAT SHOULD BE REPORTED IMMEDIATELY: *FEVER GREATER THAN 100.4 F (38 C) OR HIGHER *CHILLS OR SWEATING *NAUSEA AND VOMITING THAT IS NOT CONTROLLED WITH YOUR NAUSEA MEDICATION *UNUSUAL SHORTNESS OF BREATH *UNUSUAL BRUISING OR BLEEDING *URINARY PROBLEMS (pain or burning when urinating, or frequent urination) *BOWEL PROBLEMS (unusual diarrhea, constipation, pain near the anus) TENDERNESS IN MOUTH AND THROAT WITH OR WITHOUT PRESENCE OF ULCERS (sore throat, sores in mouth, or a toothache) UNUSUAL RASH, SWELLING OR PAIN  UNUSUAL VAGINAL DISCHARGE OR ITCHING   Items with * indicate a potential emergency and should be followed up as soon as possible or go to the Emergency Department if any problems should  occur.  Please show the CHEMOTHERAPY ALERT CARD or IMMUNOTHERAPY ALERT CARD at check-in to the Emergency Department and triage nurse.  Should you have questions after your visit or need to cancel or reschedule your appointment, please contact [facility] - A DEPT OF [facility]  Dept: [phone]  and follow the prompts.  Office hours are 8:00 a.m. to 4:30 p.m. Monday - Friday. Please note that voicemails left after 4:00 p.m. may not be returned until the following business day.  We are closed weekends and major holidays. You have access to a nurse at all times for urgent questions. Please call the main number to the clinic Dept: [phone] and follow the prompts.   For any non-urgent questions, you may also contact your provider using MyChart. We now offer e-Visits for anyone 62 and older to request care online for non-urgent symptoms. For details visit [email or web address].   Also download the MyChart app! Go to the app store, search MyChart, open the app, select [place], and log in with your MyChart username and password.

## 2024-07-27 NOTE — Assessment & Plan Note (Signed)
 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  -PET Jan 25, 2024 showed long segment anorectal hypermetabolic activity, and hypermetabolic right inguinal and external iliac lymph nodes metastasis.  No distant metastasis. -she started concurrent chemoradiation with 5-FU and mitomycin  on Jan 31, 2024. -she was hospitalized for pericarditis on 02/02/2024, clinically suspicious for 5-fu induced coronary artery spasm. Chemo held on day 4 -She was admitted to hospital for cycle 2 chemotherapy with cardiology on board, she still developed a significant recurrent chest pain and 5-FU infusion was stopped on day 4 -RT completed on 03/16/24 -PET 06/12/2024 showed resolved hypermetabolic activity in the anus and right inguinal and pelvic lymph nodes, however she has mildly progressive hypermetabolic retroperitoneal and new left [place] adenopathy. Biopsy of the left [place] node confirmed metastatic anal cancer.  -I recommend chemo carbo taxol and immunotherapy zynyz

## 2024-07-28 ENCOUNTER — Ambulatory Visit: Payer: Self-pay | Admitting: *Deleted

## 2024-07-28 ENCOUNTER — Other Ambulatory Visit: Payer: Self-pay | Admitting: *Deleted

## 2024-07-28 ENCOUNTER — Other Ambulatory Visit: Payer: Self-pay

## 2024-07-28 ENCOUNTER — Encounter: Payer: Self-pay | Admitting: Hematology

## 2024-07-28 LAB — T4: T4, Total: 7.7 ug/dL (ref 4.5–12.0)

## 2024-08-02 ENCOUNTER — Inpatient Hospital Stay (HOSPITAL_BASED_OUTPATIENT_CLINIC_OR_DEPARTMENT_OTHER): Admitting: Hematology

## 2024-08-02 ENCOUNTER — Inpatient Hospital Stay

## 2024-08-02 VITALS — BP 143/77 | HR 77 | Temp 98.4°F | Resp 18

## 2024-08-02 VITALS — BP 136/62 | HR 101 | Temp 98.3°F | Resp 16 | Ht 65.0 in | Wt 155.9 lb

## 2024-08-02 DIAGNOSIS — Z5111 Encounter for antineoplastic chemotherapy: Secondary | ICD-10-CM | POA: Diagnosis not present

## 2024-08-02 DIAGNOSIS — C210 Malignant neoplasm of anus, unspecified: Secondary | ICD-10-CM | POA: Diagnosis not present

## 2024-08-02 LAB — CBC WITH DIFFERENTIAL (CANCER CENTER ONLY)
Abs Immature Granulocytes: 0.01 K/uL (ref 0.00–0.07)
Basophils Absolute: 0 K/uL (ref 0.0–0.1)
Basophils Relative: 1 %
Eosinophils Absolute: 0 K/uL (ref 0.0–0.5)
Eosinophils Relative: 2 %
HCT: 31.3 % — ABNORMAL LOW (ref 36.0–46.0)
Hemoglobin: 10.6 g/dL — ABNORMAL LOW (ref 12.0–15.0)
Immature Granulocytes: 1 %
Lymphocytes Relative: 21 %
Lymphs Abs: 0.5 K/uL — ABNORMAL LOW (ref 0.7–4.0)
MCH: 28.6 pg (ref 26.0–34.0)
MCHC: 33.9 g/dL (ref 30.0–36.0)
MCV: 84.4 fL (ref 80.0–100.0)
Monocytes Absolute: 0.2 K/uL (ref 0.1–1.0)
Monocytes Relative: 7 %
Neutro Abs: 1.5 K/uL — ABNORMAL LOW (ref 1.7–7.7)
Neutrophils Relative %: 68 %
Platelet Count: 167 K/uL (ref 150–400)
RBC: 3.71 MIL/uL — ABNORMAL LOW (ref 3.87–5.11)
RDW: 13.2 % (ref 11.5–15.5)
WBC Count: 2.2 K/uL — ABNORMAL LOW (ref 4.0–10.5)
nRBC: 0 % (ref 0.0–0.2)

## 2024-08-02 LAB — CMP (CANCER CENTER ONLY)
ALT: 22 U/L (ref 0–44)
AST: 33 U/L (ref 15–41)
Albumin: 3.9 g/dL (ref 3.5–5.0)
Alkaline Phosphatase: 62 U/L (ref 38–126)
Anion gap: 7 (ref 5–15)
BUN: 18 mg/dL (ref 8–23)
CO2: 25 mmol/L (ref 22–32)
Calcium: 9.6 mg/dL (ref 8.9–10.3)
Chloride: 105 mmol/L (ref 98–111)
Creatinine: 0.73 mg/dL (ref 0.44–1.00)
GFR, Estimated: >60 mL/min (ref >60)
Glucose, Bld: 97 mg/dL (ref 70–99)
Potassium: 3.6 mmol/L (ref 3.5–5.1)
Sodium: 137 mmol/L (ref 135–145)
Total Bilirubin: 0.5 mg/dL (ref 0.0–1.2)
Total Protein: 7.1 g/dL (ref 6.5–8.1)

## 2024-08-02 LAB — CEA (ACCESS): CEA (CHCC): 2.38 ng/mL (ref 0.00–5.00)

## 2024-08-02 MED ORDER — DIPHENHYDRAMINE HCL 50 MG/ML IJ SOLN
25.0000 mg | Freq: Once | INTRAMUSCULAR | Status: AC
  Administered 2024-08-02: 25 mg via INTRAVENOUS
  Filled 2024-08-02: qty 1

## 2024-08-02 MED ORDER — FAMOTIDINE IN NACL 20-0.9 MG/50ML-% IV SOLN
20.0000 mg | Freq: Once | INTRAVENOUS | Status: AC
  Administered 2024-08-02: 20 mg via INTRAVENOUS
  Filled 2024-08-02: qty 50

## 2024-08-02 MED ORDER — SODIUM CHLORIDE 0.9 % IV SOLN: INTRAVENOUS | Status: DC

## 2024-08-02 MED ORDER — SODIUM CHLORIDE 0.9 % IV SOLN
80.0000 mg/m2 | Freq: Once | INTRAVENOUS | Status: AC
  Administered 2024-08-02: 150 mg via INTRAVENOUS
  Filled 2024-08-02: qty 25

## 2024-08-02 NOTE — Assessment & Plan Note (Signed)
 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  -PET Jan 25, 2024 showed long segment anorectal hypermetabolic activity, and hypermetabolic right inguinal and external iliac lymph nodes metastasis.  No distant metastasis. -she started concurrent chemoradiation with 5-FU and mitomycin  on Jan 31, 2024. -she was hospitalized for pericarditis on 02/02/2024, clinically suspicious for 5-fu induced coronary artery spasm. Chemo held on day 4 -She was admitted to hospital for cycle 2 chemotherapy with cardiology on board, she still developed a significant recurrent chest pain and 5-FU infusion was stopped on day 4 -RT completed on 03/16/24 -PET 06/12/2024 showed resolved hypermetabolic activity in the anus and right inguinal and pelvic lymph nodes, however she has mildly progressive hypermetabolic retroperitoneal and new left [place] adenopathy. Biopsy of the left [place] node confirmed metastatic anal cancer.  -I recommend chemo carbo taxol and immunotherapy zynyz, she started on 07/27/2024

## 2024-08-02 NOTE — Progress Notes (Signed)
 [facility]   Telephone:(336) [phone] [phone]   Clinic Follow up Note   Patient Care Team: Marvene Prentice SAUNDERS, FNP as PCP - General (Family Medicine) Shlomo Wilbert SAUNDERS, MD as PCP - Cardiology (Cardiology) Ardis Evalene CROME, RN as Oncology Nurse Navigator Lanny Callander, MD as Consulting Physician (Hematology and Oncology) Legrand Victory CROME DOUGLAS, MD as Consulting Physician (Gastroenterology)  Date of Service:  08/02/2024  CHIEF COMPLAINT: f/u of metastatic anal cancer  CURRENT THERAPY:  First-line chemotherapy carboplatin, paclitaxel and Zynyz every 4 weeks   Oncology History   Anal squamous cell carcinoma (HCC) cT2N1M0, stage IIIA -previous history of superficially invasive squamous cell carcinoma of anus, status post surgical resection in August 2023, presented with rectal discomfort for several months, and a palpable 2 to 3 cm mass in the anal canal, biopsy confirmed invasive squamous cell carcinoma in April 2025.  -PET Jan 25, 2024 showed long segment anorectal hypermetabolic activity, and hypermetabolic right inguinal and external iliac lymph nodes metastasis.  No distant metastasis. -she started concurrent chemoradiation with 5-FU and mitomycin  on Jan 31, 2024. -she was hospitalized for pericarditis on 02/02/2024, clinically suspicious for 5-fu induced coronary artery spasm. Chemo held on day 4 -She was admitted to hospital for cycle 2 chemotherapy with cardiology on board, she still developed a significant recurrent chest pain and 5-FU infusion was stopped on day 4 -RT completed on 03/16/24 -PET 06/12/2024 showed resolved hypermetabolic activity in the anus and right inguinal and pelvic lymph nodes, however she has mildly progressive hypermetabolic retroperitoneal and new left [place] adenopathy. Biopsy of the left [place] node confirmed metastatic anal cancer.  -I recommend chemo carbo taxol and immunotherapy zynyz, she started on 07/27/2024  Assessment & Plan Metastatic anal  cancer undergoing chemotherapy Currently undergoing chemotherapy with a regimen of three drugs last week and one drug this week. Experiencing fatigue as a side effect. Blood counts show low white count, hemoglobin at 10.6, and platelet count at 5. Neutrophil count is low and may drop further next week. Oncodetect test is positive, indicating circulating tumor DNA, which will be used as a baseline to monitor treatment response. A scan is planned in three months to evaluate treatment response. - Continue chemotherapy regimen as scheduled. - Monitor blood counts and will consider growth factor shot if neutrophil count drops significantly next week. - Will repeat Oncodetect test every 8 weeks to monitor circulating tumor DNA. - Will schedule scan in three months to evaluate treatment response.  Chemotherapy-induced constipation Experiencing constipation likely due to nausea medication given before chemotherapy. Reports painful bowel movements and difficulty passing stool. - Recommended use of Miralax, available over the counter, to alleviate constipation. - Advised use of a one-time enema if needed, with caution due to colitis. - Instructed to monitor response to Miralax and adjust dosage if necessary.  Chemotherapy-induced fatigue Experiencing fatigue as a side effect of chemotherapy. Able to perform daily activities but at a slower pace. - Advised to maintain a slower pace with activities to manage fatigue.  Chemotherapy-induced neutropenia Neutrophil count is low, which may drop further next week. Monitoring is necessary to determine if a growth factor shot is needed. - Will monitor neutrophil count and will consider growth factor shot if count drops significantly next week.  Plan - He tolerated cycle 1 day 1 treatment well last week, except constipation - Lab reviewed, adequate for treatment, will proceed cycle 1 day 8 paclitaxel today - She will return next week for cycle 1 day 15  paclitaxel. - Will add G-CSF on day 15 for her neutropenia.   SUMMARY OF ONCOLOGIC HISTORY: Oncology History  Anal squamous cell carcinoma (HCC)  12/29/2023 Pathology Results   A. PERIANAL, RIGHT POSTERIOR, BIOPSY:       Invasive squamous cell carcinoma, moderately differentiated. Suspicious for lymphovascular invasion.  B. PERIANAL, LEFT POSTERIOR, BIOPSY:       High-grade squamous intraepithelial lesion (HSIL / AIN 3).  No evidence of definitive invasion.    01/20/2024 Initial Diagnosis   Anal cancer (HCC)   01/26/2024 Cancer Staging   Staging form: Anus, AJCC 8th Edition - Clinical: Stage IIIA (cT2, cN1c, cM0) - Signed by Lanny Callander, MD on 01/26/2024   01/31/2024 - 02/28/2024 Chemotherapy   Patient is on Treatment Plan : ANUS Mitomycin  D1,28 + 5FU D1-4, 28-31 q32d     07/27/2024 -  Chemotherapy   Patient is on Treatment Plan : ANUS Retifanlimab + Paclitaxel (80) + Carboplatin (5) / Retifanlimab        Discussed the use of AI scribe software for clinical note transcription with the patient, who gave verbal consent to proceed.  History of Present Illness GRIER CZERWINSKI is a 68 year old female with metastatic anal cancer who presents for follow-up after starting chemotherapy.  She started chemotherapy last week and experiences fatigue, which slows her daily activities but does not prevent her from caring for herself or walking her dog. Constipation is present, with an episode of bowel movement described as extremely painful. She has not tried any remedies for constipation. She has started integrative therapy, including curcumin supplements and mistletoe injections, administered three times a week.     All other systems were reviewed with the patient and are negative.  MEDICAL HISTORY:  Past Medical History:  Diagnosis Date   Anemia    Anginal pain    Arthritis    Chest discomfort    Disorder of immune system    Dyspnea    Dyspnea on exertion    Hypercholesterolemia     Hypertension    Migraine    Non-ulcer dyspepsia    Osteopenia of spine    Peptic ulcer disease    Pneumonia    Polyp of rectum    cancerous   Pre-diabetes    Skin cancer    Ulcerative colitis (HCC)     SURGICAL HISTORY: Past Surgical History:  Procedure Laterality Date   ANKLE FRACTURE SURGERY Left    CARDIAC CATHETERIZATION     COLONOSCOPY     ESOPHAGOGASTRODUODENOSCOPY     hip replacement Bilateral    IR IMAGING GUIDED PORT INSERTION  07/24/2024   LEFT HEART CATH AND CORONARY ANGIOGRAPHY N/A 01/21/2022   Procedure: LEFT HEART CATH AND CORONARY ANGIOGRAPHY;  Surgeon: Verlin Lonni JONETTA, MD;  Location: [facility];  Service: Cardiovascular;  Laterality: N/A;   LEFT HEART CATH AND CORONARY ANGIOGRAPHY N/A 02/03/2024   Procedure: LEFT HEART CATH AND CORONARY ANGIOGRAPHY;  Surgeon: Darron Deatrice LABOR, MD;  Location: [facility];  Service: Cardiovascular;  Laterality: N/A;   RECTAL BIOPSY N/A 05/04/2022   Procedure: BIOPSY RECTAL VS EXCISION OF PERIANAL LESION UNDER ANOSCOPY;  Surgeon: Teresa Lonni HERO, MD;  Location: [facility];  Service: General;  Laterality: N/A;   TOTAL KNEE ARTHROPLASTY Left 10/19/2022   Procedure: LEFT TOTAL KNEE ARTHROPLASTY;  Surgeon: Jerri Kay HERO, MD;  Location: [facility];  Service: Orthopedics;  Laterality: Left;   TUBAL LIGATION     TUMOR EXCISION N/A 01/14/2024  Procedure: TRANSRECTAL TUMOR EXCISION;  Surgeon: Teresa Lonni HERO, MD;  Location: [facility];  Service: General;  Laterality: N/A;  Transanal excision of distal rectal and anal lesion (wide local)    I have reviewed the social history and family history with the patient and they are unchanged from previous note.  ALLERGIES:  is allergic to codeine.  MEDICATIONS:  Current Outpatient Medications  Medication Sig Dispense Refill   acetaminophen  (TYLENOL ) 500 MG tablet Take 1,000 mg by mouth every 6 (six) hours as needed for moderate pain.     albuterol  (VENTOLIN   HFA) 108 (90 Base) MCG/ACT inhaler Inhale 1-2 puffs into the lungs every 4 (four) hours as needed for wheezing or shortness of breath. 8 g 0   ALPRAZolam (XANAX) 0.25 MG tablet Take 1 tablet (0.25 mg total) by mouth 2 (two) times daily as needed for anxiety. 30 tablet 0   amLODipine  (NORVASC ) 5 MG tablet Take 1 tablet (5 mg total) by mouth daily. 90 tablet 3   ammonium lactate (LAC-HYDRIN) 12 % lotion Apply 1 Application topically daily.     atorvastatin  (LIPITOR) 80 MG tablet TAKE ONE TABLET BY MOUTH DAILY 90 tablet 1   ezetimibe  (ZETIA ) 10 MG tablet TAKE 1 TABLET BY MOUTH DAILY 90 tablet 3   famotidine  (PEPCID ) 20 MG tablet Take 1 tablet (20 mg total) by mouth 2 (two) times daily as needed for heartburn or indigestion. 60 tablet 1   fluticasone  (FLONASE ) 50 MCG/ACT nasal spray Place 2 sprays into both nostrils as needed for allergies or rhinitis.     hydrocortisone  (ANUSOL -HC) 25 MG suppository Place 1 suppository (25 mg total) rectally 2 (two) times daily. 12 suppository 2   lidocaine -prilocaine  (EMLA ) cream Apply to affected area once 30 g 3   nitroGLYCERIN  (NITROSTAT ) 0.4 MG SL tablet Place 1 tablet (0.4 mg total) under the tongue every 5 (five) minutes as needed for chest pain. 20 tablet 12   ondansetron  (ZOFRAN ) 8 MG tablet Take 1 tablet (8 mg total) by mouth every 8 (eight) hours as needed for nausea or vomiting. Start on the third day after carboplatin. 30 tablet 1   oxyCODONE  (OXY IR/ROXICODONE ) 5 MG immediate release tablet Take 0.5-1 tablets (2.5-5 mg total) by mouth every 4 (four) hours as needed for severe pain (pain score 7-10). 60 tablet 0   PARoxetine  (PAXIL ) 20 MG tablet Take 10 mg by mouth daily.     prochlorperazine  (COMPAZINE ) 10 MG tablet Take 1 tablet (10 mg total) by mouth every 6 (six) hours as needed for nausea or vomiting. 30 tablet 1   silver  sulfADIAZINE  (SILVADENE ) 1 % cream Apply 1 Application topically 3 (three) times daily as needed. Apply to buttocks area as needed 50  g 1   SUMAtriptan (IMITREX) 25 MG tablet Take 25 mg by mouth every 2 (two) hours as needed for migraine.     vedolizumab  (ENTYVIO ) 300 MG injection Inject 300 mg into the vein every 8 (eight) weeks.     No current facility-administered medications for this visit.   Facility-Administered Medications Ordered in Other Visits  Medication Dose Route Frequency Provider Last Rate Last Admin   0.9 %  sodium chloride  infusion   Intravenous Continuous Lanny Callander, MD   Stopped at 08/02/24 1601    PHYSICAL EXAMINATION: ECOG PERFORMANCE STATUS: 1 - Symptomatic but completely ambulatory  Vitals:   08/02/24 1257  BP: 136/62  Pulse: (!) 101  Resp: 16  Temp: 98.3 F (36.8 C)  SpO2: 97%  Wt Readings from Last 3 Encounters:  08/02/24 155 lb 14.4 oz (70.7 kg)  07/27/24 157 lb 3.2 oz (71.3 kg)  07/13/24 162 lb 8 oz (73.7 kg)     GENERAL:alert, no distress and comfortable SKIN: skin color, texture, turgor are normal, no rashes or significant lesions EYES: normal, Conjunctiva are pink and non-injected, sclera clear NECK: supple, thyroid normal size, non-tender, without nodularity LYMPH:  no palpable lymphadenopathy in the cervical, axillary  LUNGS: clear to auscultation and percussion with normal breathing effort HEART: regular rate & rhythm and no murmurs and no lower extremity edema ABDOMEN:abdomen soft, non-tender and normal bowel sounds Musculoskeletal:no cyanosis of digits and no clubbing  NEURO: alert & oriented x 3 with fluent speech, no focal motor/sensory deficits  Physical Exam    LABORATORY DATA:  I have reviewed the data as listed    Latest Ref Rng & Units 08/02/2024   12:31 PM 07/27/2024   10:20 AM 06/15/2024    9:36 AM  CBC  WBC 4.0 - 10.5 K/uL 2.2  4.7  3.5   Hemoglobin 12.0 - 15.0 g/dL 89.3  89.5  89.6   Hematocrit 36.0 - 46.0 % 31.3  31.0  30.8   Platelets 150 - 400 K/uL 167  222  180         Latest Ref Rng & Units 08/02/2024   12:31 PM 07/27/2024   10:20 AM  06/15/2024    9:36 AM  CMP  Glucose 70 - 99 mg/dL 97  94  890   BUN 8 - 23 mg/dL 18  12  17    Creatinine 0.44 - 1.00 mg/dL 9.26  9.19  9.14   Sodium 135 - 145 mmol/L 137  140  140   Potassium 3.5 - 5.1 mmol/L 3.6  3.7  4.0   Chloride 98 - 111 mmol/L 105  107  111   CO2 22 - 32 mmol/L 25  25  26    Calcium  8.9 - 10.3 mg/dL 9.6  9.5  9.2   Total Protein 6.5 - 8.1 g/dL 7.1  7.0  6.5   Total Bilirubin 0.0 - 1.2 mg/dL 0.5  0.5  0.4   Alkaline Phos 38 - 126 U/L 62  61  68   AST 15 - 41 U/L 33  17  18   ALT 0 - 44 U/L 22  12  11        RADIOGRAPHIC STUDIES: I have personally reviewed the radiological images as listed and agreed with the findings in the report. No results found.    No orders of the defined types were placed in this encounter.  All questions were answered. The patient knows to call the clinic with any problems, questions or concerns. No barriers to learning was detected. The total time spent in the appointment was 25 minutes, including review of chart and various tests results, discussions about plan of care and coordination of care plan     Onita Mattock, MD 08/02/2024

## 2024-08-02 NOTE — Patient Instructions (Signed)
 [facility] - A DEPT OF [facility]  Discharge Instructions: Thank you for choosing [facility] to provide your oncology and hematology care.   If you have a lab appointment with the Cancer Center, please go directly to the Cancer Center and check in at the registration area.   Wear comfortable clothing and clothing appropriate for easy access to any Portacath or PICC line.   We strive to give you quality time with your provider. You may need to reschedule your appointment if you arrive late (15 or more minutes).  Arriving late affects you and other patients whose appointments are after yours.  Also, if you miss three or more appointments without notifying the office, you may be dismissed from the clinic at the provider's discretion.      For prescription refill requests, have your pharmacy contact our office and allow 72 hours for refills to be completed.    Today you received the following chemotherapy and/or immunotherapy agents:Taxol (Paclitaxel)   To help prevent nausea and vomiting after your treatment, we encourage you to take your nausea medication as directed.  BELOW ARE SYMPTOMS THAT SHOULD BE REPORTED IMMEDIATELY: *FEVER GREATER THAN 100.4 F (38 C) OR HIGHER *CHILLS OR SWEATING *NAUSEA AND VOMITING THAT IS NOT CONTROLLED WITH YOUR NAUSEA MEDICATION *UNUSUAL SHORTNESS OF BREATH *UNUSUAL BRUISING OR BLEEDING *URINARY PROBLEMS (pain or burning when urinating, or frequent urination) *BOWEL PROBLEMS (unusual diarrhea, constipation, pain near the anus) TENDERNESS IN MOUTH AND THROAT WITH OR WITHOUT PRESENCE OF ULCERS (sore throat, sores in mouth, or a toothache) UNUSUAL RASH, SWELLING OR PAIN  UNUSUAL VAGINAL DISCHARGE OR ITCHING   Items with * indicate a potential emergency and should be followed up as soon as possible or go to the Emergency Department if any problems should occur.  Please show the CHEMOTHERAPY ALERT CARD or  IMMUNOTHERAPY ALERT CARD at check-in to the Emergency Department and triage nurse.  Should you have questions after your visit or need to cancel or reschedule your appointment, please contact [facility] - [facility] DEPT [facility]  Dept: [phone]  and follow the prompts.  Office hours are 8:00 a.m. to 4:30 p.m. Monday - Friday. Please note that voicemails left after 4:00 p.m. may not be returned until the following business day.  We are closed weekends and major holidays. You have access to a nurse at all times for urgent questions. Please call the main number to the clinic Dept: [phone] and follow the prompts.   For any non-urgent questions, you may also contact your provider using MyChart. We now offer e-Visits for anyone 71 and older to request care online for non-urgent symptoms. For details visit [email or web address].   Also download the MyChart app! Go to the app store, search MyChart, open the app, select [place], and log in with your MyChart username and password.

## 2024-08-03 ENCOUNTER — Other Ambulatory Visit: Payer: Self-pay

## 2024-08-04 ENCOUNTER — Encounter: Payer: Self-pay | Admitting: Hematology

## 2024-08-07 ENCOUNTER — Encounter: Payer: Self-pay | Admitting: Hematology

## 2024-08-08 ENCOUNTER — Other Ambulatory Visit: Payer: Self-pay

## 2024-08-08 NOTE — Assessment & Plan Note (Signed)
 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  -PET Jan 25, 2024 showed long segment anorectal hypermetabolic activity, and hypermetabolic right inguinal and external iliac lymph nodes metastasis.  No distant metastasis. -she started concurrent chemoradiation with 5-FU and mitomycin  on Jan 31, 2024. -she was hospitalized for pericarditis on 02/02/2024, clinically suspicious for 5-fu induced coronary artery spasm. Chemo held on day 4 -She was admitted to hospital for cycle 2 chemotherapy with cardiology on board, she still developed a significant recurrent chest pain and 5-FU infusion was stopped on day 4 -RT completed on 03/16/24 -PET 06/12/2024 showed resolved hypermetabolic activity in the anus and right inguinal and pelvic lymph nodes, however she has mildly progressive hypermetabolic retroperitoneal and new left [place] adenopathy. Biopsy of the left [place] node confirmed metastatic anal cancer.  -I recommend chemo carbo taxol and immunotherapy zynyz, she started on 07/27/2024

## 2024-08-09 ENCOUNTER — Inpatient Hospital Stay

## 2024-08-09 ENCOUNTER — Encounter: Payer: Self-pay | Admitting: Hematology

## 2024-08-09 ENCOUNTER — Inpatient Hospital Stay (HOSPITAL_BASED_OUTPATIENT_CLINIC_OR_DEPARTMENT_OTHER): Admitting: Hematology

## 2024-08-09 VITALS — BP 123/70 | HR 86 | Temp 98.4°F | Resp 15 | Ht 65.0 in | Wt 154.8 lb

## 2024-08-09 DIAGNOSIS — Z5111 Encounter for antineoplastic chemotherapy: Secondary | ICD-10-CM | POA: Diagnosis not present

## 2024-08-09 DIAGNOSIS — C210 Malignant neoplasm of anus, unspecified: Secondary | ICD-10-CM | POA: Diagnosis not present

## 2024-08-09 LAB — CMP (CANCER CENTER ONLY)
ALT: 21 U/L (ref 0–44)
AST: 25 U/L (ref 15–41)
Albumin: 4 g/dL (ref 3.5–5.0)
Alkaline Phosphatase: 77 U/L (ref 38–126)
Anion gap: 11 (ref 5–15)
BUN: 13 mg/dL (ref 8–23)
CO2: 23 mmol/L (ref 22–32)
Calcium: 9.5 mg/dL (ref 8.9–10.3)
Chloride: 104 mmol/L (ref 98–111)
Creatinine: 0.71 mg/dL (ref 0.44–1.00)
GFR, Estimated: >60 mL/min (ref >60)
Glucose, Bld: 109 mg/dL — ABNORMAL HIGH (ref 70–99)
Potassium: 3.7 mmol/L (ref 3.5–5.1)
Sodium: 138 mmol/L (ref 135–145)
Total Bilirubin: 0.5 mg/dL (ref 0.0–1.2)
Total Protein: 6.7 g/dL (ref 6.5–8.1)

## 2024-08-09 LAB — CBC WITH DIFFERENTIAL (CANCER CENTER ONLY)
Abs Immature Granulocytes: 0 K/uL (ref 0.00–0.07)
Basophils Absolute: 0 K/uL (ref 0.0–0.1)
Basophils Relative: 1 %
Eosinophils Absolute: 0 K/uL (ref 0.0–0.5)
Eosinophils Relative: 1 %
HCT: 26.2 % — ABNORMAL LOW (ref 36.0–46.0)
Hemoglobin: 9.2 g/dL — ABNORMAL LOW (ref 12.0–15.0)
Immature Granulocytes: 0 %
Lymphocytes Relative: 26 %
Lymphs Abs: 0.5 K/uL — ABNORMAL LOW (ref 0.7–4.0)
MCH: 29 pg (ref 26.0–34.0)
MCHC: 35.1 g/dL (ref 30.0–36.0)
MCV: 82.6 fL (ref 80.0–100.0)
Monocytes Absolute: 0.3 K/uL (ref 0.1–1.0)
Monocytes Relative: 14 %
Neutro Abs: 1.2 K/uL — ABNORMAL LOW (ref 1.7–7.7)
Neutrophils Relative %: 58 %
Platelet Count: 81 K/uL — ABNORMAL LOW (ref 150–400)
RBC: 3.17 MIL/uL — ABNORMAL LOW (ref 3.87–5.11)
RDW: 13.1 % (ref 11.5–15.5)
WBC Count: 2 K/uL — ABNORMAL LOW (ref 4.0–10.5)
nRBC: 0 % (ref 0.0–0.2)

## 2024-08-09 LAB — CEA (ACCESS): CEA (CHCC): 4.04 ng/mL (ref 0.00–5.00)

## 2024-08-09 MED ORDER — SODIUM CHLORIDE 0.9 % IV SOLN: INTRAVENOUS | Status: DC

## 2024-08-09 MED ORDER — FAMOTIDINE IN NACL 20-0.9 MG/50ML-% IV SOLN
20.0000 mg | Freq: Once | INTRAVENOUS | Status: AC
  Administered 2024-08-09: 20 mg via INTRAVENOUS
  Filled 2024-08-09: qty 50

## 2024-08-09 MED ORDER — DIPHENHYDRAMINE HCL 50 MG/ML IJ SOLN
25.0000 mg | Freq: Once | INTRAMUSCULAR | Status: AC
  Administered 2024-08-09: 25 mg via INTRAVENOUS
  Filled 2024-08-09: qty 1

## 2024-08-09 MED ORDER — SODIUM CHLORIDE 0.9 % IV SOLN
80.0000 mg/m2 | Freq: Once | INTRAVENOUS | Status: AC
  Administered 2024-08-09: 150 mg via INTRAVENOUS
  Filled 2024-08-09: qty 25

## 2024-08-09 NOTE — Patient Instructions (Signed)
 [facility] - A DEPT OF [facility]  Discharge Instructions: Thank you for choosing [facility] to provide your oncology and hematology care.   If you have a lab appointment with the Cancer Center, please go directly to the Cancer Center and check in at the registration area.   Wear comfortable clothing and clothing appropriate for easy access to any Portacath or PICC line.   We strive to give you quality time with your provider. You may need to reschedule your appointment if you arrive late (15 or more minutes).  Arriving late affects you and other patients whose appointments are after yours.  Also, if you miss three or more appointments without notifying the office, you may be dismissed from the clinic at the provider's discretion.      For prescription refill requests, have your pharmacy contact our office and allow 72 hours for refills to be completed.    Today you received the following chemotherapy and/or immunotherapy agent: PACLitaxel  (TAXOL )      To help prevent nausea and vomiting after your treatment, we encourage you to take your nausea medication as directed.  BELOW ARE SYMPTOMS THAT SHOULD BE REPORTED IMMEDIATELY: *FEVER GREATER THAN 100.4 F (38 C) OR HIGHER *CHILLS OR SWEATING *NAUSEA AND VOMITING THAT IS NOT CONTROLLED WITH YOUR NAUSEA MEDICATION *UNUSUAL SHORTNESS OF BREATH *UNUSUAL BRUISING OR BLEEDING *URINARY PROBLEMS (pain or burning when urinating, or frequent urination) *BOWEL PROBLEMS (unusual diarrhea, constipation, pain near the anus) TENDERNESS IN MOUTH AND THROAT WITH OR WITHOUT PRESENCE OF ULCERS (sore throat, sores in mouth, or a toothache) UNUSUAL RASH, SWELLING OR PAIN  UNUSUAL VAGINAL DISCHARGE OR ITCHING   Items with * indicate a potential emergency and should be followed up as soon as possible or go to the Emergency Department if any problems should occur.  Please show the CHEMOTHERAPY ALERT CARD or  IMMUNOTHERAPY ALERT CARD at check-in to the Emergency Department and triage nurse.  Should you have questions after your visit or need to cancel or reschedule your appointment, please contact [facility]  Dept: [phone]  and follow the prompts.  Office hours are 8:00 a.m. to 4:30 p.m. Monday - Friday. Please note that voicemails left after 4:00 p.m. may not be returned until the following business day.  We are closed weekends and major holidays. You have access to a nurse at all times for urgent questions. Please call the main number to the clinic Dept: [phone] and follow the prompts.   For any non-urgent questions, you may also contact your provider using MyChart. We now offer e-Visits for anyone 66 and older to request care online for non-urgent symptoms. For details visit [email or web address].   Also download the MyChart app! Go to the app store, search MyChart, open the app, select [place], and log in with your MyChart username and password.

## 2024-08-09 NOTE — Progress Notes (Signed)
 [facility]   Telephone:([phone]) [phone] [phone]) [phone]   Clinic Follow up Note   Patient Care Team: Marvene Prentice SAUNDERS, FNP as PCP - General (Family Medicine) Shlomo Wilbert SAUNDERS, MD as PCP - Cardiology (Cardiology) Ardis Evalene CROME, RN as Oncology Nurse Navigator Lanny Callander, MD as Consulting Physician (Hematology and Oncology) Legrand Victory CROME DOUGLAS, MD as Consulting Physician (Gastroenterology)  Date of Service:  08/09/2024  CHIEF COMPLAINT: f/u of anal cancer  CURRENT THERAPY:  First-line chemotherapy carboplatin , gemcitabine and Zynyz   Oncology History   Anal squamous cell carcinoma (HCC) cT2N1M0, stage IIIA -previous history of superficially invasive squamous cell carcinoma of anus, status post surgical resection in August 2023, presented with rectal discomfort for several months, and a palpable 2 to 3 cm mass in the anal canal, biopsy confirmed invasive squamous cell carcinoma in April 2025.  -PET Jan 25, 2024 showed long segment anorectal hypermetabolic activity, and hypermetabolic right inguinal and external iliac lymph nodes metastasis.  No distant metastasis. -she started concurrent chemoradiation with 5-FU and mitomycin  on Jan 31, 2024. -she was hospitalized for pericarditis on 02/02/2024, clinically suspicious for 5-fu induced coronary artery spasm. Chemo held on day 4 -She was admitted to hospital for cycle 2 chemotherapy with cardiology on board, she still developed a significant recurrent chest pain and 5-FU infusion was stopped on day 4 -RT completed on 03/16/24 -PET 06/12/2024 showed resolved hypermetabolic activity in the anus and right inguinal and pelvic lymph nodes, however she has mildly progressive hypermetabolic retroperitoneal and new left [place] adenopathy. Biopsy of the left [place] node confirmed metastatic anal cancer.  -I recommend chemo carbo taxol  and immunotherapy zynyz , she started on 07/27/2024  Assessment & Plan Anal cancer undergoing  chemotherapy Currently undergoing chemotherapy with Taxol  on weeks two and three. Experiencing fatigue, common in the first week of treatment. No significant nausea or joint pain reported. Blood counts show low white blood cell count, expected with treatment. PET scan planned for cycle three to assess treatment response. - Continue chemotherapy regimen with Taxol  on weeks two and three. - Will schedule PET scan for cycle three to assess treatment response. - Will administer Udenica injection to boost white blood cell count. - Advised taking Claritin once daily for five days to manage potential bone pain from [place]den[place]. - Encouraged maintaining hydration and fiber intake to manage bowel movements.  Chemotherapy-induced neutropenia White blood cell count is low, expected with chemotherapy. Udenica injection planned to stimulate bone marrow to produce more white blood cells. Claritin recommended to manage potential bone pain from [place]. - Administered Udenica injection to boost white blood cell count. - Advised taking Claritin once daily for five days to manage potential bone pain from [place].  Constipation with rectal bleeding Reports constipation with rectal bleeding, possibly due to tissue irritation. Currently taking Miralax. Bowel movements occur daily or every other day. No abdominal pain reported. - Increase Miralax dosage to ensure softer bowel movements. - Encourage increased water and fiber intake.  Colitis No flare of colitis observed during the first cycle of chemotherapy. Continues colitis treatment to maintain control, especially with upcoming immunotherapy. - Continue current colitis treatment regimen.  Plan - She is tolerating chemotherapy well, lab reviewed, ANC 1.2, CMP still pending, if adequate we will proceed cycle 1 day 15 Taxol  - She will return in 2 weeks to start cycle 2 chemotherapy - Udenyca  injection in 2 days, she knows to take a Claritin for 5 days - Follow-up in  2 weeks - Will  repeat OncoDetect every 6 weeks    SUMMARY OF ONCOLOGIC HISTORY: Oncology History  Anal squamous cell carcinoma (HCC)  12/29/2023 Pathology Results   A. PERIANAL, RIGHT POSTERIOR, BIOPSY:       Invasive squamous cell carcinoma, moderately differentiated. Suspicious for lymphovascular invasion.  B. PERIANAL, LEFT POSTERIOR, BIOPSY:       High-grade squamous intraepithelial lesion (HSIL / AIN 3).  No evidence of definitive invasion.    01/20/2024 Initial Diagnosis   Anal cancer (HCC)   01/26/2024 Cancer Staging   Staging form: Anus, AJCC 8th Edition - Clinical: Stage IIIA (cT2, cN1c, cM0) - Signed by Lanny Callander, MD on 01/26/2024   01/31/2024 - 02/28/2024 Chemotherapy   Patient is on Treatment Plan : ANUS Mitomycin  D1,28 + 5FU D1-4, 28-31 q32d     07/27/2024 -  Chemotherapy   Patient is on Treatment Plan : ANUS Retifanlimab  + Paclitaxel  (80) + Carboplatin  (5) / Retifanlimab         Discussed the use of AI scribe software for clinical note transcription with the patient, who gave verbal consent to proceed.  History of Present Illness Andrea [name] is a 68 year old female with anal cancer who presents for follow-up of her treatment.  She is undergoing treatment for anal cancer and is currently in the second week of her treatment cycle with Taxol . Fatigue was more pronounced during the first week but has improved. Constipation occurred last weekend, possibly causing tissue tearing and blood in the commode. She manages bowel movements with daily Miralax. Her white blood cell count is low. She has a history of colitis but has not experienced any flare-ups during this treatment cycle. No nausea, joint pain, skin rash, or stomach pain are present.     All other systems were reviewed with the patient and are negative.  MEDICAL HISTORY:  Past Medical History:  Diagnosis Date   Anemia    Anginal pain    Arthritis    Chest discomfort    Disorder of immune system    Dyspnea     Dyspnea on exertion    Hypercholesterolemia    Hypertension    Migraine    Non-ulcer dyspepsia    Osteopenia of spine    Peptic ulcer disease    Pneumonia    Polyp of rectum    cancerous   Pre-diabetes    Skin cancer    Ulcerative colitis (HCC)     SURGICAL HISTORY: Past Surgical History:  Procedure Laterality Date   ANKLE FRACTURE SURGERY Left    CARDIAC CATHETERIZATION     COLONOSCOPY     ESOPHAGOGASTRODUODENOSCOPY     hip replacement Bilateral    IR IMAGING GUIDED PORT INSERTION  07/24/2024   LEFT HEART CATH AND CORONARY ANGIOGRAPHY N/A 01/21/2022   Procedure: LEFT HEART CATH AND CORONARY ANGIOGRAPHY;  Surgeon: Verlin Lonni JONETTA, MD;  Location: [facility];  Service: Cardiovascular;  Laterality: N/A;   LEFT HEART CATH AND CORONARY ANGIOGRAPHY N/A 02/03/2024   Procedure: LEFT HEART CATH AND CORONARY ANGIOGRAPHY;  Surgeon: Darron Deatrice LABOR, MD;  Location: [facility];  Service: Cardiovascular;  Laterality: N/A;   RECTAL BIOPSY N/A 05/04/2022   Procedure: BIOPSY RECTAL VS EXCISION OF PERIANAL LESION UNDER ANOSCOPY;  Surgeon: Teresa Lonni HERO, MD;  Location: [facility];  Service: General;  Laterality: N/A;   TOTAL KNEE ARTHROPLASTY Left 10/19/2022   Procedure: LEFT TOTAL KNEE ARTHROPLASTY;  Surgeon: Jerri Kay HERO, MD;  Location: [facility];  Service: Orthopedics;  Laterality: Left;   TUBAL LIGATION     TUMOR EXCISION N/A 01/14/2024   Procedure: TRANSRECTAL TUMOR EXCISION;  Surgeon: Teresa Lonni HERO, MD;  Location: [facility];  Service: General;  Laterality: N/A;  Transanal excision of distal rectal and anal lesion (wide local)    I have reviewed the social history and family history with the patient and they are unchanged from previous note.  ALLERGIES:  is allergic to codeine.  MEDICATIONS:  Current Outpatient Medications  Medication Sig Dispense Refill   acetaminophen  (TYLENOL ) 500 MG tablet Take 1,000 mg by mouth every 6 (six) hours as  needed for moderate pain.     albuterol  (VENTOLIN  HFA) 108 (90 Base) MCG/ACT inhaler Inhale 1-2 puffs into the lungs every 4 (four) hours as needed for wheezing or shortness of breath. 8 g 0   ALPRAZolam (XANAX) 0.25 MG tablet Take 1 tablet (0.25 mg total) by mouth 2 (two) times daily as needed for anxiety. 30 tablet 0   amLODipine  (NORVASC ) 5 MG tablet Take 1 tablet (5 mg total) by mouth daily. 90 tablet 3   ammonium lactate (LAC-HYDRIN) 12 % lotion Apply 1 Application topically daily.     atorvastatin  (LIPITOR) 80 MG tablet TAKE ONE TABLET BY MOUTH DAILY 90 tablet 1   ezetimibe  (ZETIA ) 10 MG tablet TAKE 1 TABLET BY MOUTH DAILY 90 tablet 3   famotidine  (PEPCID ) 20 MG tablet Take 1 tablet (20 mg total) by mouth 2 (two) times daily as needed for heartburn or indigestion. 60 tablet 1   fluticasone  (FLONASE ) 50 MCG/ACT nasal spray Place 2 sprays into both nostrils as needed for allergies or rhinitis.     hydrocortisone  (ANUSOL -HC) 25 MG suppository Place 1 suppository (25 mg total) rectally 2 (two) times daily. 12 suppository 2   lidocaine -prilocaine  (EMLA ) cream Apply to affected area once 30 g 3   nitroGLYCERIN  (NITROSTAT ) 0.4 MG SL tablet Place 1 tablet (0.4 mg total) under the tongue every 5 (five) minutes as needed for chest pain. 20 tablet 12   ondansetron  (ZOFRAN ) 8 MG tablet Take 1 tablet (8 mg total) by mouth every 8 (eight) hours as needed for nausea or vomiting. Start on the third day after carboplatin. 30 tablet 1   oxyCODONE  (OXY IR/ROXICODONE ) 5 MG immediate release tablet Take 0.5-1 tablets (2.5-5 mg total) by mouth every 4 (four) hours as needed for severe pain (pain score 7-10). 60 tablet 0   PARoxetine  (PAXIL ) 20 MG tablet Take 10 mg by mouth daily.     prochlorperazine  (COMPAZINE ) 10 MG tablet Take 1 tablet (10 mg total) by mouth every 6 (six) hours as needed for nausea or vomiting. 30 tablet 1   silver  sulfADIAZINE  (SILVADENE ) 1 % cream Apply 1 Application topically 3 (three) times  daily as needed. Apply to buttocks area as needed 50 g 1   SUMAtriptan (IMITREX) 25 MG tablet Take 25 mg by mouth every 2 (two) hours as needed for migraine.     vedolizumab  (ENTYVIO ) 300 MG injection Inject 300 mg into the vein every 8 (eight) weeks.     No current facility-administered medications for this visit.    PHYSICAL EXAMINATION: ECOG PERFORMANCE STATUS: 1 - Symptomatic but completely ambulatory  Vitals:   08/09/24 0840 08/09/24 0846  BP: (!) 150/68 123/70  Pulse: 86   Resp: 15   Temp: 98.4 F (36.9 C)   SpO2: 97%    Wt Readings from Last 3 Encounters:  08/09/24 154 lb 12.8 oz (70.2 kg)  08/02/24 155 lb 14.4 oz (70.7 kg)  07/27/24 157 lb 3.2 oz (71.3 kg)     GENERAL:alert, no distress and comfortable SKIN: skin color, texture, turgor are normal, no rashes or significant lesions EYES: normal, Conjunctiva are pink and non-injected, sclera clear Musculoskeletal:no cyanosis of digits and no clubbing  NEURO: alert & oriented x 3 with fluent speech, no focal motor/sensory deficits  Physical Exam    LABORATORY DATA:  I have reviewed the data as listed    Latest Ref Rng & Units 08/09/2024    8:21 AM 08/02/2024   12:31 PM 07/27/2024   10:20 AM  CBC  WBC 4.0 - 10.5 K/uL 2.0  2.2  4.7   Hemoglobin 12.0 - 15.0 g/dL 9.2  89.3  89.5   Hematocrit 36.0 - 46.0 % 26.2  31.3  31.0   Platelets 150 - 400 K/uL 81  167  222         Latest Ref Rng & Units 08/02/2024   12:31 PM 07/27/2024   10:20 AM 06/15/2024    9:36 AM  CMP  Glucose 70 - 99 mg/dL 97  94  890   BUN 8 - 23 mg/dL 18  12  17    Creatinine 0.44 - 1.00 mg/dL 9.26  9.19  9.14   Sodium 135 - 145 mmol/L 137  140  140   Potassium 3.5 - 5.1 mmol/L 3.6  3.7  4.0   Chloride 98 - 111 mmol/L 105  107  111   CO2 22 - 32 mmol/L 25  25  26    Calcium  8.9 - 10.3 mg/dL 9.6  9.5  9.2   Total Protein 6.5 - 8.1 g/dL 7.1  7.0  6.5   Total Bilirubin 0.0 - 1.2 mg/dL 0.5  0.5  0.4   Alkaline Phos 38 - 126 U/L 62  61  68   AST  15 - 41 U/L 33  17  18   ALT 0 - 44 U/L 22  12  11        RADIOGRAPHIC STUDIES: I have personally reviewed the radiological images as listed and agreed with the findings in the report. No results found.    Orders Placed This Encounter  Procedures   CBC with Differential (Cancer Center Only)    Standing Status:   Future    Expected Date:   10/19/2024    Expiration Date:   10/19/2025   CMP (Cancer Center only)    Standing Status:   Future    Expected Date:   10/19/2024    Expiration Date:   10/19/2025   CBC with Differential (Cancer Center Only)    Standing Status:   Future    Expected Date:   10/26/2024    Expiration Date:   10/26/2025   CMP (Cancer Center only)    Standing Status:   Future    Expected Date:   10/26/2024    Expiration Date:   10/26/2025   CBC with Differential (Cancer Center Only)    Standing Status:   Future    Expected Date:   11/02/2024    Expiration Date:   11/02/2025   CMP (Cancer Center only)    Standing Status:   Future    Expected Date:   11/02/2024    Expiration Date:   11/02/2025   CBC with Differential (Cancer Center Only)    Standing Status:   Future    Expected Date:   11/16/2024    Expiration Date:   11/16/2025  CMP (Cancer Center only)    Standing Status:   Future    Expected Date:   11/16/2024    Expiration Date:   11/16/2025   CBC with Differential (Cancer Center Only)    Standing Status:   Future    Expected Date:   11/23/2024    Expiration Date:   11/23/2025   CMP (Cancer Center only)    Standing Status:   Future    Expected Date:   11/23/2024    Expiration Date:   11/23/2025   CBC with Differential (Cancer Center Only)    Standing Status:   Future    Expected Date:   11/30/2024    Expiration Date:   11/30/2025   CMP (Cancer Center only)    Standing Status:   Future    Expected Date:   11/30/2024    Expiration Date:   11/30/2025   All questions were answered. The patient knows to call the clinic with any problems, questions or concerns. No barriers to  learning was detected. The total time spent in the appointment was 25 minutes, including review of chart and various tests results, discussions about plan of care and coordination of care plan     Onita Mattock, MD 08/09/2024

## 2024-08-10 ENCOUNTER — Other Ambulatory Visit: Payer: Self-pay

## 2024-08-11 ENCOUNTER — Inpatient Hospital Stay

## 2024-08-11 DIAGNOSIS — Z5111 Encounter for antineoplastic chemotherapy: Secondary | ICD-10-CM | POA: Diagnosis not present

## 2024-08-11 DIAGNOSIS — C210 Malignant neoplasm of anus, unspecified: Secondary | ICD-10-CM

## 2024-08-11 MED ORDER — PEGFILGRASTIM-CBQV 6 MG/0.6ML ~~LOC~~ SOSY
6.0000 mg | PREFILLED_SYRINGE | Freq: Once | SUBCUTANEOUS | Status: AC
  Administered 2024-08-11: 6 mg via SUBCUTANEOUS
  Filled 2024-08-11: qty 0.6

## 2024-08-14 ENCOUNTER — Other Ambulatory Visit (HOSPITAL_COMMUNITY): Payer: Self-pay

## 2024-08-14 ENCOUNTER — Other Ambulatory Visit: Payer: Self-pay

## 2024-08-21 ENCOUNTER — Inpatient Hospital Stay

## 2024-08-24 ENCOUNTER — Inpatient Hospital Stay

## 2024-08-24 ENCOUNTER — Inpatient Hospital Stay: Attending: Hematology

## 2024-08-24 ENCOUNTER — Inpatient Hospital Stay: Admitting: Hematology

## 2024-08-24 VITALS — BP 122/62 | HR 72 | Resp 14

## 2024-08-24 VITALS — BP 130/63 | HR 83 | Temp 97.9°F | Resp 18 | Ht 65.0 in | Wt 153.7 lb

## 2024-08-24 DIAGNOSIS — D6481 Anemia due to antineoplastic chemotherapy: Secondary | ICD-10-CM

## 2024-08-24 DIAGNOSIS — C210 Malignant neoplasm of anus, unspecified: Secondary | ICD-10-CM

## 2024-08-24 DIAGNOSIS — Z5111 Encounter for antineoplastic chemotherapy: Secondary | ICD-10-CM | POA: Diagnosis present

## 2024-08-24 DIAGNOSIS — Z5189 Encounter for other specified aftercare: Secondary | ICD-10-CM | POA: Insufficient documentation

## 2024-08-24 DIAGNOSIS — T451X5A Adverse effect of antineoplastic and immunosuppressive drugs, initial encounter: Secondary | ICD-10-CM | POA: Diagnosis not present

## 2024-08-24 DIAGNOSIS — C778 Secondary and unspecified malignant neoplasm of lymph nodes of multiple regions: Secondary | ICD-10-CM | POA: Diagnosis present

## 2024-08-24 DIAGNOSIS — Z79899 Other long term (current) drug therapy: Secondary | ICD-10-CM | POA: Insufficient documentation

## 2024-08-24 LAB — CBC WITH DIFFERENTIAL (CANCER CENTER ONLY)
Abs Immature Granulocytes: 0.04 K/uL (ref 0.00–0.07)
Basophils Absolute: 0 K/uL (ref 0.0–0.1)
Basophils Relative: 1 %
Eosinophils Absolute: 0.1 K/uL (ref 0.0–0.5)
Eosinophils Relative: 1 %
HCT: 25.8 % — ABNORMAL LOW (ref 36.0–46.0)
Hemoglobin: 8.6 g/dL — ABNORMAL LOW (ref 12.0–15.0)
Immature Granulocytes: 1 %
Lymphocytes Relative: 19 %
Lymphs Abs: 0.8 K/uL (ref 0.7–4.0)
MCH: 28.2 pg (ref 26.0–34.0)
MCHC: 33.3 g/dL (ref 30.0–36.0)
MCV: 84.6 fL (ref 80.0–100.0)
Monocytes Absolute: 0.5 K/uL (ref 0.1–1.0)
Monocytes Relative: 11 %
Neutro Abs: 2.9 K/uL (ref 1.7–7.7)
Neutrophils Relative %: 67 %
Platelet Count: 133 K/uL — ABNORMAL LOW (ref 150–400)
RBC: 3.05 MIL/uL — ABNORMAL LOW (ref 3.87–5.11)
RDW: 15.5 % (ref 11.5–15.5)
WBC Count: 4.3 K/uL (ref 4.0–10.5)
nRBC: 0 % (ref 0.0–0.2)

## 2024-08-24 LAB — CMP (CANCER CENTER ONLY)
ALT: 16 U/L (ref 0–44)
AST: 23 U/L (ref 15–41)
Albumin: 4 g/dL (ref 3.5–5.0)
Alkaline Phosphatase: 82 U/L (ref 38–126)
Anion gap: 9 (ref 5–15)
BUN: 17 mg/dL (ref 8–23)
CO2: 24 mmol/L (ref 22–32)
Calcium: 9.5 mg/dL (ref 8.9–10.3)
Chloride: 107 mmol/L (ref 98–111)
Creatinine: 0.65 mg/dL (ref 0.44–1.00)
GFR, Estimated: >60 mL/min (ref >60)
Glucose, Bld: 100 mg/dL — ABNORMAL HIGH (ref 70–99)
Potassium: 3.8 mmol/L (ref 3.5–5.1)
Sodium: 140 mmol/L (ref 135–145)
Total Bilirubin: 0.4 mg/dL (ref 0.0–1.2)
Total Protein: 6.9 g/dL (ref 6.5–8.1)

## 2024-08-24 LAB — CEA (ACCESS): CEA (CHCC): 2.33 ng/mL (ref 0.00–5.00)

## 2024-08-24 MED ORDER — SODIUM CHLORIDE 0.9 % IV SOLN
80.0000 mg/m2 | Freq: Once | INTRAVENOUS | Status: AC
  Administered 2024-08-24: 150 mg via INTRAVENOUS
  Filled 2024-08-24: qty 25

## 2024-08-24 MED ORDER — APREPITANT 130 MG/18ML IV EMUL
130.0000 mg | Freq: Once | INTRAVENOUS | Status: AC
  Administered 2024-08-24: 130 mg via INTRAVENOUS
  Filled 2024-08-24: qty 18

## 2024-08-24 MED ORDER — DIPHENHYDRAMINE HCL 50 MG/ML IJ SOLN
25.0000 mg | Freq: Once | INTRAMUSCULAR | Status: AC
  Administered 2024-08-24: 25 mg via INTRAVENOUS
  Filled 2024-08-24: qty 1

## 2024-08-24 MED ORDER — SODIUM CHLORIDE 0.9 % IV SOLN
391.0500 mg | Freq: Once | INTRAVENOUS | Status: AC
  Administered 2024-08-24: 390 mg via INTRAVENOUS
  Filled 2024-08-24: qty 39

## 2024-08-24 MED ORDER — SODIUM CHLORIDE 0.9 % IV SOLN: INTRAVENOUS | Status: DC

## 2024-08-24 MED ORDER — PALONOSETRON HCL INJECTION 0.25 MG/5ML
0.2500 mg | Freq: Once | INTRAVENOUS | Status: AC
  Administered 2024-08-24: 0.25 mg via INTRAVENOUS
  Filled 2024-08-24: qty 5

## 2024-08-24 MED ORDER — FAMOTIDINE IN NACL 20-0.9 MG/50ML-% IV SOLN
20.0000 mg | Freq: Once | INTRAVENOUS | Status: AC
  Administered 2024-08-24: 20 mg via INTRAVENOUS
  Filled 2024-08-24: qty 50

## 2024-08-24 MED ORDER — SODIUM CHLORIDE 0.9 % IV SOLN
500.0000 mg | Freq: Once | INTRAVENOUS | Status: AC
  Administered 2024-08-24: 500 mg via INTRAVENOUS
  Filled 2024-08-24: qty 20

## 2024-08-24 NOTE — Patient Instructions (Signed)
 [facility] - A DEPT OF [facility]  Discharge Instructions: Thank you for choosing [facility] to provide your oncology and hematology care.   If you have a lab appointment with the Cancer Center, please go directly to the Cancer Center and check in at the registration area.   Wear comfortable clothing and clothing appropriate for easy access to any Portacath or PICC line.   We strive to give you quality time with your provider. You may need to reschedule your appointment if you arrive late (15 or more minutes).  Arriving late affects you and other patients whose appointments are after yours.  Also, if you miss three or more appointments without notifying the office, you may be dismissed from the clinic at the provider's discretion.      For prescription refill requests, have your pharmacy contact our office and allow 72 hours for refills to be completed.    Today you received the following chemotherapy and/or immunotherapy agents: Zynyz /Taxol /Carboplatin       To help prevent nausea and vomiting after your treatment, we encourage you to take your nausea medication as directed.  BELOW ARE SYMPTOMS THAT SHOULD BE REPORTED IMMEDIATELY: *FEVER GREATER THAN 100.4 F (38 C) OR HIGHER *CHILLS OR SWEATING *NAUSEA AND VOMITING THAT IS NOT CONTROLLED WITH YOUR NAUSEA MEDICATION *UNUSUAL SHORTNESS OF BREATH *UNUSUAL BRUISING OR BLEEDING *URINARY PROBLEMS (pain or burning when urinating, or frequent urination) *BOWEL PROBLEMS (unusual diarrhea, constipation, pain near the anus) TENDERNESS IN MOUTH AND THROAT WITH OR WITHOUT PRESENCE OF ULCERS (sore throat, sores in mouth, or a toothache) UNUSUAL RASH, SWELLING OR PAIN  UNUSUAL VAGINAL DISCHARGE OR ITCHING   Items with * indicate a potential emergency and should be followed up as soon as possible or go to the Emergency Department if any problems should occur.  Please show the CHEMOTHERAPY ALERT CARD or  IMMUNOTHERAPY ALERT CARD at check-in to the Emergency Department and triage nurse.  Should you have questions after your visit or need to cancel or reschedule your appointment, please contact [facility] - A DEPT OF [facility]  Dept: [phone]  and follow the prompts.  Office hours are 8:00 a.m. to 4:30 p.m. Monday - Friday. Please note that voicemails left after 4:00 p.m. may not be returned until the following business day.  We are closed weekends and major holidays. You have access to a nurse at all times for urgent questions. Please call the main number to the clinic Dept: [phone] and follow the prompts.   For any non-urgent questions, you may also contact your provider using MyChart. We now offer e-Visits for anyone 1 and older to request care online for non-urgent symptoms. For details visit [email or web address].   Also download the MyChart app! Go to the app store, search MyChart, open the app, select [place], and log in with your MyChart username and password.

## 2024-08-24 NOTE — Progress Notes (Signed)
 [facility]   Telephone:([phone]) [phone] Fax:([phone]) [phone]   Clinic Follow up Note   Patient Care Team: Marvene Prentice SAUNDERS, FNP as PCP - General (Family Medicine) Shlomo Wilbert SAUNDERS, MD as PCP - Cardiology (Cardiology) Ardis Evalene CROME, RN as Oncology Nurse Navigator Lanny Callander, MD as Consulting Physician (Hematology and Oncology) Legrand Victory CROME DOUGLAS, MD as Consulting Physician (Gastroenterology)  Date of Service:  08/24/2024  CHIEF COMPLAINT: f/u of anal cancer  CURRENT THERAPY:  First-line chemotherapy carboplatin , paclitaxel  and Zynyz   Oncology History   Anal squamous cell carcinoma (HCC) cT2N1M0, stage IIIA -previous history of superficially invasive squamous cell carcinoma of anus, status post surgical resection in August 2023, presented with rectal discomfort for several months, and a palpable 2 to 3 cm mass in the anal canal, biopsy confirmed invasive squamous cell carcinoma in April 2025.  -PET Jan 25, 2024 showed long segment anorectal hypermetabolic activity, and hypermetabolic right inguinal and external iliac lymph nodes metastasis.  No distant metastasis. -she started concurrent chemoradiation with 5-FU and mitomycin  on Jan 31, 2024. -she was hospitalized for pericarditis on 02/02/2024, clinically suspicious for 5-fu induced coronary artery spasm. Chemo held on day 4 -She was admitted to hospital for cycle 2 chemotherapy with cardiology on board, she still developed a significant recurrent chest pain and 5-FU infusion was stopped on day 4 -RT completed on 03/16/24 -PET 06/12/2024 showed resolved hypermetabolic activity in the anus and right inguinal and pelvic lymph nodes, however she has mildly progressive hypermetabolic retroperitoneal and new left [place] adenopathy. Biopsy of the left [place] node confirmed metastatic anal cancer.  -I recommend chemo carbo taxol  and immunotherapy zynyz , she started on 07/27/2024  Assessment & Plan Metastatic anal cancer Undergoing intensive  treatment with chemotherapy and immunotherapy. Currently receiving carboplatin , paclitaxel , and immunotherapy. Treatment schedule includes a week off after the first week of treatment, followed by paclitaxel  alone for the next two weeks, and then a week off around Christmas. Blood counts have recovered after a week off, but hemoglobin has dropped, indicating anemia. Kidney and liver functions are normal, allowing continuation of treatment. - Continue current chemotherapy and immunotherapy regimen. - Reduced carboplatin  dose by 10% due to rapid drop in blood counts. - Scheduled next treatment for December 18th, 2025. - Will test ABO blood type in preparation for potential blood transfusion. - Will send blood sample to blood bank for potential transfusion.  Chemotherapy-induced anemia Hemoglobin level has dropped to 8.6, likely due to chemotherapy. Anemia is causing fatigue. If hemoglobin drops below 8, a blood transfusion will be necessary. - Monitor hemoglobin levels closely. - Will prepare for potential blood transfusion if hemoglobin drops below 8.  Chemotherapy-related fatigue and myalgia Experiencing fatigue and muscle tightness, particularly in the shoulders and neck area, likely related to chemotherapy. Paclitaxel  can cause muscle cramps. Encouraged to stay active to counteract fatigue. - Use heating pad or massage for muscle tightness. - Encouraged staying active and engaging in light exercise.  Rectal pain with bowel movements Experiencing rectal pain during bowel movements, which has improved since completing radiation therapy but remains uncomfortable. Bowel movements are formed and not constipated. - Consider using Miralax or stool softener to ease bowel movements.  Plan - Lab reviewed, adequate for treatment, will proceed cycle 2-day 1 chemotherapy  - Due to rapid developing anemia, I will reduce carboplatin  AUC from 5 to 4.5 today -f/u in 2 weeks before chemo    SUMMARY OF  ONCOLOGIC HISTORY: Oncology History  Anal squamous cell carcinoma (  HCC)  12/29/2023 Pathology Results   A. PERIANAL, RIGHT POSTERIOR, BIOPSY:       Invasive squamous cell carcinoma, moderately differentiated. Suspicious for lymphovascular invasion.  B. PERIANAL, LEFT POSTERIOR, BIOPSY:       High-grade squamous intraepithelial lesion (HSIL / AIN 3).  No evidence of definitive invasion.    01/20/2024 Initial Diagnosis   Anal cancer (HCC)   01/26/2024 Cancer Staging   Staging form: Anus, AJCC 8th Edition - Clinical: Stage IIIA (cT2, cN1c, cM0) - Signed by Lanny Callander, MD on 01/26/2024   01/31/2024 - 02/28/2024 Chemotherapy   Patient is on Treatment Plan : ANUS Mitomycin  D1,28 + 5FU D1-4, 28-31 q32d     07/27/2024 -  Chemotherapy   Patient is on Treatment Plan : ANUS Retifanlimab  + Paclitaxel  (80) + Carboplatin  (5) / Retifanlimab         Discussed the use of AI scribe software for clinical note transcription with the patient, who gave verbal consent to proceed.  History of Present Illness Andrea [name] is a 68 year old female with metastatic anal cancer who presents for follow-up.  She is undergoing chemotherapy and radiation for metastatic anal cancer, with a recent week-long treatment break. She has painful but formed daily bowel movements localized to defecation, without constipation. Pain was worse during radiation and has improved with pelvic floor therapy.  She feels fatigued. Her hemoglobin has declined from 9.2 to 8.6 g/dL, and she has never received a transfusion. Platelet and white blood cell counts have recovered after the treatment break.  She has tightness in her shoulders and neck and brief episodes of sudden weakness while walking. She remains active and walks in her neighborhood with her dog.     All other systems were reviewed with the patient and are negative.  MEDICAL HISTORY:  Past Medical History:  Diagnosis Date   Anemia    Anginal pain    Arthritis    Chest  discomfort    Disorder of immune system    Dyspnea    Dyspnea on exertion    Hypercholesterolemia    Hypertension    Migraine    Non-ulcer dyspepsia    Osteopenia of spine    Peptic ulcer disease    Pneumonia    Polyp of rectum    cancerous   Pre-diabetes    Skin cancer    Ulcerative colitis (HCC)     SURGICAL HISTORY: Past Surgical History:  Procedure Laterality Date   ANKLE FRACTURE SURGERY Left    CARDIAC CATHETERIZATION     COLONOSCOPY     ESOPHAGOGASTRODUODENOSCOPY     hip replacement Bilateral    IR IMAGING GUIDED PORT INSERTION  07/24/2024   LEFT HEART CATH AND CORONARY ANGIOGRAPHY N/A 01/21/2022   Procedure: LEFT HEART CATH AND CORONARY ANGIOGRAPHY;  Surgeon: Verlin Lonni JONETTA, MD;  Location: [facility];  Service: Cardiovascular;  Laterality: N/A;   LEFT HEART CATH AND CORONARY ANGIOGRAPHY N/A 02/03/2024   Procedure: LEFT HEART CATH AND CORONARY ANGIOGRAPHY;  Surgeon: Darron Deatrice LABOR, MD;  Location: [facility];  Service: Cardiovascular;  Laterality: N/A;   RECTAL BIOPSY N/A 05/04/2022   Procedure: BIOPSY RECTAL VS EXCISION OF PERIANAL LESION UNDER ANOSCOPY;  Surgeon: Teresa Lonni HERO, MD;  Location: [facility];  Service: General;  Laterality: N/A;   TOTAL KNEE ARTHROPLASTY Left 10/19/2022   Procedure: LEFT TOTAL KNEE ARTHROPLASTY;  Surgeon: Jerri Kay HERO, MD;  Location: [facility];  Service: Orthopedics;  Laterality: Left;  TUBAL LIGATION     TUMOR EXCISION N/A 01/14/2024   Procedure: TRANSRECTAL TUMOR EXCISION;  Surgeon: Teresa Lonni HERO, MD;  Location: [facility];  Service: General;  Laterality: N/A;  Transanal excision of distal rectal and anal lesion (wide local)    I have reviewed the social history and family history with the patient and they are unchanged from previous note.  ALLERGIES:  is allergic to codeine.  MEDICATIONS:  Current Outpatient Medications  Medication Sig Dispense Refill   ivermectin (STROMECTOL) 3 MG  TABS tablet Take 12 mg by mouth once.     acetaminophen  (TYLENOL ) 500 MG tablet Take 1,000 mg by mouth every 6 (six) hours as needed for moderate pain.     albuterol  (VENTOLIN  HFA) 108 (90 Base) MCG/ACT inhaler Inhale 1-2 puffs into the lungs every 4 (four) hours as needed for wheezing or shortness of breath. 8 g 0   ALPRAZolam  (XANAX ) 0.25 MG tablet Take 1 tablet (0.25 mg total) by mouth 2 (two) times daily as needed for anxiety. 30 tablet 0   amLODipine  (NORVASC ) 5 MG tablet Take 1 tablet (5 mg total) by mouth daily. 90 tablet 3   ammonium lactate (LAC-HYDRIN) 12 % lotion Apply 1 Application topically daily.     atorvastatin  (LIPITOR) 80 MG tablet TAKE ONE TABLET BY MOUTH DAILY 90 tablet 1   ezetimibe  (ZETIA ) 10 MG tablet TAKE 1 TABLET BY MOUTH DAILY 90 tablet 3   famotidine  (PEPCID ) 20 MG tablet Take 1 tablet (20 mg total) by mouth 2 (two) times daily as needed for heartburn or indigestion. 60 tablet 1   fluticasone  (FLONASE ) 50 MCG/ACT nasal spray Place 2 sprays into both nostrils as needed for allergies or rhinitis.     hydrocortisone  (ANUSOL -HC) 25 MG suppository Place 1 suppository (25 mg total) rectally 2 (two) times daily. 12 suppository 2   lidocaine -prilocaine  (EMLA ) cream Apply to affected area once 30 g 3   nitroGLYCERIN  (NITROSTAT ) 0.4 MG SL tablet Place 1 tablet (0.4 mg total) under the tongue every 5 (five) minutes as needed for chest pain. 20 tablet 12   ondansetron  (ZOFRAN ) 8 MG tablet Take 1 tablet (8 mg total) by mouth every 8 (eight) hours as needed for nausea or vomiting. Start on the third day after carboplatin . 30 tablet 1   oxyCODONE  (OXY IR/ROXICODONE ) 5 MG immediate release tablet Take 0.5-1 tablets (2.5-5 mg total) by mouth every 4 (four) hours as needed for severe pain (pain score 7-10). 60 tablet 0   PARoxetine  (PAXIL ) 20 MG tablet Take 10 mg by mouth daily.     prochlorperazine  (COMPAZINE ) 10 MG tablet Take 1 tablet (10 mg total) by mouth every 6 (six) hours as needed  for nausea or vomiting. 30 tablet 1   silver  sulfADIAZINE  (SILVADENE ) 1 % cream Apply 1 Application topically 3 (three) times daily as needed. Apply to buttocks area as needed 50 g 1   SUMAtriptan (IMITREX) 25 MG tablet Take 25 mg by mouth every 2 (two) hours as needed for migraine.     vedolizumab  (ENTYVIO ) 300 MG injection Inject 300 mg into the vein every 8 (eight) weeks.     No current facility-administered medications for this visit.   Facility-Administered Medications Ordered in Other Visits  Medication Dose Route Frequency Provider Last Rate Last Admin   0.9 %  sodium chloride  infusion   Intravenous Continuous Lanny Callander, MD   Stopped at 08/24/24 1410    PHYSICAL EXAMINATION: ECOG PERFORMANCE STATUS: 1 - Symptomatic  but completely ambulatory  Vitals:   08/24/24 0945 08/24/24 0948  BP: (!) 143/63 130/63  Pulse: 93 83  Resp: 18   Temp: 97.9 F (36.6 C)   SpO2: 97% 100%   Wt Readings from Last 3 Encounters:  08/24/24 153 lb 11.2 oz (69.7 kg)  08/09/24 154 lb 12.8 oz (70.2 kg)  08/02/24 155 lb 14.4 oz (70.7 kg)     GENERAL:alert, no distress and comfortable SKIN: skin color, texture, turgor are normal, no rashes or significant lesions EYES: normal, Conjunctiva are pink and non-injected, sclera clear NECK: supple, thyroid normal size, non-tender, without nodularity LYMPH:  no palpable lymphadenopathy in the cervical, axillary  LUNGS: clear to auscultation and percussion with normal breathing effort HEART: regular rate & rhythm and no murmurs and no lower extremity edema ABDOMEN:abdomen soft, non-tender and normal bowel sounds Musculoskeletal:no cyanosis of digits and no clubbing  NEURO: alert & oriented x 3 with fluent speech, no focal motor/sensory deficits  Physical Exam    LABORATORY DATA:  I have reviewed the data as listed    Latest Ref Rng & Units 08/24/2024    9:22 AM 08/09/2024    8:21 AM 08/02/2024   12:31 PM  CBC  WBC 4.0 - 10.5 K/uL 4.3  2.0  2.2    Hemoglobin 12.0 - 15.0 g/dL 8.6  9.2  89.3   Hematocrit 36.0 - 46.0 % 25.8  26.2  31.3   Platelets 150 - 400 K/uL 133  81  167         Latest Ref Rng & Units 08/24/2024    9:22 AM 08/09/2024    8:21 AM 08/02/2024   12:31 PM  CMP  Glucose 70 - 99 mg/dL 899  890  97   BUN 8 - 23 mg/dL 17  13  18    Creatinine 0.44 - 1.00 mg/dL 9.34  9.28  9.26   Sodium 135 - 145 mmol/L 140  138  137   Potassium 3.5 - 5.1 mmol/L 3.8  3.7  3.6   Chloride 98 - 111 mmol/L 107  104  105   CO2 22 - 32 mmol/L 24  23  25    Calcium  8.9 - 10.3 mg/dL 9.5  9.5  9.6   Total Protein 6.5 - 8.1 g/dL 6.9  6.7  7.1   Total Bilirubin 0.0 - 1.2 mg/dL 0.4  0.5  0.5   Alkaline Phos 38 - 126 U/L 82  77  62   AST 15 - 41 U/L 23  25  33   ALT 0 - 44 U/L 16  21  22        RADIOGRAPHIC STUDIES: I have personally reviewed the radiological images as listed and agreed with the findings in the report. No results found.    Orders Placed This Encounter  Procedures   Sample to Blood Bank    Standing Status:   Standing    Number of Occurrences:   20    Next Expected Occurrence:   08/25/2024    Expiration Date:   08/24/2025   ABO/RH    Standing Status:   Future    Expected Date:   08/31/2024    Expiration Date:   08/24/2025   All questions were answered. The patient knows to call the clinic with any problems, questions or concerns. No barriers to learning was detected. The total time spent in the appointment was 30 minutes, including review of chart and various tests results, discussions about plan of care and  coordination of care plan     Onita Mattock, MD 08/24/2024

## 2024-08-24 NOTE — Assessment & Plan Note (Signed)
 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  -PET Jan 25, 2024 showed long segment anorectal hypermetabolic activity, and hypermetabolic right inguinal and external iliac lymph nodes metastasis.  No distant metastasis. -she started concurrent chemoradiation with 5-FU and mitomycin  on Jan 31, 2024. -she was hospitalized for pericarditis on 02/02/2024, clinically suspicious for 5-fu induced coronary artery spasm. Chemo held on day 4 -She was admitted to hospital for cycle 2 chemotherapy with cardiology on board, she still developed a significant recurrent chest pain and 5-FU infusion was stopped on day 4 -RT completed on 03/16/24 -PET 06/12/2024 showed resolved hypermetabolic activity in the anus and right inguinal and pelvic lymph nodes, however she has mildly progressive hypermetabolic retroperitoneal and new left [place] adenopathy. Biopsy of the left [place] node confirmed metastatic anal cancer.  -I recommend chemo carbo taxol and immunotherapy zynyz, she started on 07/27/2024

## 2024-08-27 ENCOUNTER — Other Ambulatory Visit: Payer: Self-pay

## 2024-08-31 ENCOUNTER — Inpatient Hospital Stay

## 2024-08-31 ENCOUNTER — Other Ambulatory Visit: Payer: Self-pay

## 2024-08-31 VITALS — BP 160/63 | HR 98 | Temp 98.3°F | Resp 16 | Wt 151.2 lb

## 2024-08-31 DIAGNOSIS — C210 Malignant neoplasm of anus, unspecified: Secondary | ICD-10-CM

## 2024-08-31 DIAGNOSIS — T451X5A Adverse effect of antineoplastic and immunosuppressive drugs, initial encounter: Secondary | ICD-10-CM

## 2024-08-31 DIAGNOSIS — Z5111 Encounter for antineoplastic chemotherapy: Secondary | ICD-10-CM | POA: Diagnosis not present

## 2024-08-31 LAB — CMP (CANCER CENTER ONLY)
ALT: 21 U/L (ref 0–44)
AST: 29 U/L (ref 15–41)
Albumin: 4 g/dL (ref 3.5–5.0)
Alkaline Phosphatase: 74 U/L (ref 38–126)
Anion gap: 10 (ref 5–15)
BUN: 17 mg/dL (ref 8–23)
CO2: 22 mmol/L (ref 22–32)
Calcium: 9.4 mg/dL (ref 8.9–10.3)
Chloride: 107 mmol/L (ref 98–111)
Creatinine: 0.68 mg/dL (ref 0.44–1.00)
GFR, Estimated: >60 mL/min (ref >60)
Glucose, Bld: 94 mg/dL (ref 70–99)
Potassium: 3.9 mmol/L (ref 3.5–5.1)
Sodium: 139 mmol/L (ref 135–145)
Total Bilirubin: 0.4 mg/dL (ref 0.0–1.2)
Total Protein: 6.8 g/dL (ref 6.5–8.1)

## 2024-08-31 LAB — CBC WITH DIFFERENTIAL (CANCER CENTER ONLY)
Abs Immature Granulocytes: 0.01 K/uL (ref 0.00–0.07)
Basophils Absolute: 0 K/uL (ref 0.0–0.1)
Basophils Relative: 0 %
Eosinophils Absolute: 0 K/uL (ref 0.0–0.5)
Eosinophils Relative: 2 %
HCT: 23.1 % — ABNORMAL LOW (ref 36.0–46.0)
Hemoglobin: 7.9 g/dL — ABNORMAL LOW (ref 12.0–15.0)
Immature Granulocytes: 0 %
Lymphocytes Relative: 28 %
Lymphs Abs: 0.7 K/uL (ref 0.7–4.0)
MCH: 29.2 pg (ref 26.0–34.0)
MCHC: 34.2 g/dL (ref 30.0–36.0)
MCV: 85.2 fL (ref 80.0–100.0)
Monocytes Absolute: 0.2 K/uL (ref 0.1–1.0)
Monocytes Relative: 10 %
Neutro Abs: 1.4 K/uL — ABNORMAL LOW (ref 1.7–7.7)
Neutrophils Relative %: 60 %
Platelet Count: 169 K/uL (ref 150–400)
RBC: 2.71 MIL/uL — ABNORMAL LOW (ref 3.87–5.11)
RDW: 16 % — ABNORMAL HIGH (ref 11.5–15.5)
WBC Count: 2.4 K/uL — ABNORMAL LOW (ref 4.0–10.5)
nRBC: 0 % (ref 0.0–0.2)

## 2024-08-31 LAB — SAMPLE TO BLOOD BANK

## 2024-08-31 LAB — CEA (ACCESS): CEA (CHCC): 2.77 ng/mL (ref 0.00–5.00)

## 2024-08-31 LAB — ABO/RH: ABO/RH(D): O POS

## 2024-08-31 MED ORDER — SODIUM CHLORIDE 0.9 % IV SOLN
80.0000 mg/m2 | Freq: Once | INTRAVENOUS | Status: AC
  Administered 2024-08-31: 150 mg via INTRAVENOUS
  Filled 2024-08-31: qty 25

## 2024-08-31 MED ORDER — SODIUM CHLORIDE 0.9 % IV SOLN: INTRAVENOUS | Status: DC

## 2024-08-31 MED ORDER — FAMOTIDINE IN NACL 20-0.9 MG/50ML-% IV SOLN
20.0000 mg | Freq: Once | INTRAVENOUS | Status: AC
  Administered 2024-08-31: 20 mg via INTRAVENOUS
  Filled 2024-08-31: qty 50

## 2024-08-31 MED ORDER — SODIUM CHLORIDE 0.9% FLUSH: 10.0000 mL | INTRAVENOUS | Status: DC | PRN

## 2024-08-31 MED ORDER — DIPHENHYDRAMINE HCL 50 MG/ML IJ SOLN
25.0000 mg | Freq: Once | INTRAMUSCULAR | Status: AC
  Administered 2024-08-31: 25 mg via INTRAVENOUS
  Filled 2024-08-31: qty 1

## 2024-08-31 NOTE — Patient Instructions (Signed)

## 2024-09-03 ENCOUNTER — Other Ambulatory Visit: Payer: Self-pay | Admitting: Hematology

## 2024-09-05 ENCOUNTER — Ambulatory Visit (INDEPENDENT_AMBULATORY_CARE_PROVIDER_SITE_OTHER)

## 2024-09-05 VITALS — BP 145/67 | HR 94 | Temp 98.0°F | Resp 18 | Ht 65.0 in | Wt 150.4 lb

## 2024-09-05 DIAGNOSIS — K5130 Ulcerative (chronic) rectosigmoiditis without complications: Secondary | ICD-10-CM

## 2024-09-05 MED ORDER — VEDOLIZUMAB 300 MG IV SOLR
300.0000 mg | Freq: Once | INTRAVENOUS | Status: AC
  Administered 2024-09-05: 10:00:00 300 mg via INTRAVENOUS
  Filled 2024-09-05: qty 5

## 2024-09-05 NOTE — Progress Notes (Signed)
 Diagnosis: Ulcerative Rectosigmoditis  Provider:  Mannam, Praveen MD  Procedure: IV Infusion  IV Type: Peripheral, IV Location: L Antecubital  Entyvio  (Vedolizumab ), Dose: 300 mg  Infusion Start Time: 0952  Infusion Stop Time: 1026  Post Infusion IV Care: Peripheral IV Discontinued  Discharge: Condition: Good, Destination: Home . AVS Declined  Performed by:  Almer Littleton, RN

## 2024-09-06 ENCOUNTER — Other Ambulatory Visit: Payer: Self-pay

## 2024-09-07 ENCOUNTER — Inpatient Hospital Stay

## 2024-09-07 ENCOUNTER — Inpatient Hospital Stay: Admitting: Hematology

## 2024-09-07 VITALS — BP 140/67 | HR 88 | Resp 16

## 2024-09-07 VITALS — BP 146/71 | HR 86 | Temp 97.7°F | Resp 17 | Wt 154.7 lb

## 2024-09-07 DIAGNOSIS — D649 Anemia, unspecified: Secondary | ICD-10-CM

## 2024-09-07 DIAGNOSIS — C210 Malignant neoplasm of anus, unspecified: Secondary | ICD-10-CM

## 2024-09-07 DIAGNOSIS — Z5111 Encounter for antineoplastic chemotherapy: Secondary | ICD-10-CM | POA: Diagnosis not present

## 2024-09-07 DIAGNOSIS — D6481 Anemia due to antineoplastic chemotherapy: Secondary | ICD-10-CM | POA: Diagnosis not present

## 2024-09-07 DIAGNOSIS — T451X5A Adverse effect of antineoplastic and immunosuppressive drugs, initial encounter: Secondary | ICD-10-CM | POA: Diagnosis not present

## 2024-09-07 LAB — CBC WITH DIFFERENTIAL (CANCER CENTER ONLY)
Abs Immature Granulocytes: 0.01 K/uL (ref 0.00–0.07)
Basophils Absolute: 0 K/uL (ref 0.0–0.1)
Basophils Relative: 1 %
Eosinophils Absolute: 0 K/uL (ref 0.0–0.5)
Eosinophils Relative: 1 %
HCT: 20.3 % — ABNORMAL LOW (ref 36.0–46.0)
Hemoglobin: 6.9 g/dL — CL (ref 12.0–15.0)
Immature Granulocytes: 1 %
Lymphocytes Relative: 32 %
Lymphs Abs: 0.6 K/uL — ABNORMAL LOW (ref 0.7–4.0)
MCH: 28.8 pg (ref 26.0–34.0)
MCHC: 34 g/dL (ref 30.0–36.0)
MCV: 84.6 fL (ref 80.0–100.0)
Monocytes Absolute: 0.3 K/uL (ref 0.1–1.0)
Monocytes Relative: 18 %
Neutro Abs: 0.9 K/uL — ABNORMAL LOW (ref 1.7–7.7)
Neutrophils Relative %: 47 %
Platelet Count: 136 K/uL — ABNORMAL LOW (ref 150–400)
RBC: 2.4 MIL/uL — ABNORMAL LOW (ref 3.87–5.11)
RDW: 16.5 % — ABNORMAL HIGH (ref 11.5–15.5)
WBC Count: 1.9 K/uL — ABNORMAL LOW (ref 4.0–10.5)
nRBC: 0 % (ref 0.0–0.2)

## 2024-09-07 LAB — CMP (CANCER CENTER ONLY)
ALT: 20 U/L (ref 0–44)
AST: 32 U/L (ref 15–41)
Albumin: 4 g/dL (ref 3.5–5.0)
Alkaline Phosphatase: 69 U/L (ref 38–126)
Anion gap: 9 (ref 5–15)
BUN: 16 mg/dL (ref 8–23)
CO2: 23 mmol/L (ref 22–32)
Calcium: 9.4 mg/dL (ref 8.9–10.3)
Chloride: 107 mmol/L (ref 98–111)
Creatinine: 0.73 mg/dL (ref 0.44–1.00)
GFR, Estimated: >60 mL/min (ref >60)
Glucose, Bld: 95 mg/dL (ref 70–99)
Potassium: 3.9 mmol/L (ref 3.5–5.1)
Sodium: 139 mmol/L (ref 135–145)
Total Bilirubin: 0.4 mg/dL (ref 0.0–1.2)
Total Protein: 6.6 g/dL (ref 6.5–8.1)

## 2024-09-07 LAB — PREPARE RBC (CROSSMATCH)

## 2024-09-07 LAB — SAMPLE TO BLOOD BANK

## 2024-09-07 MED ORDER — SODIUM CHLORIDE 0.9 % IV SOLN
80.0000 mg/m2 | Freq: Once | INTRAVENOUS | Status: AC
  Administered 2024-09-07: 15:00:00 150 mg via INTRAVENOUS
  Filled 2024-09-07: qty 25

## 2024-09-07 MED ORDER — FAMOTIDINE IN NACL 20-0.9 MG/50ML-% IV SOLN
20.0000 mg | Freq: Once | INTRAVENOUS | Status: AC
  Administered 2024-09-07: 14:00:00 20 mg via INTRAVENOUS
  Filled 2024-09-07: qty 50

## 2024-09-07 MED ORDER — DIPHENHYDRAMINE HCL 50 MG/ML IJ SOLN
25.0000 mg | Freq: Once | INTRAMUSCULAR | Status: AC
  Administered 2024-09-07: 14:00:00 25 mg via INTRAVENOUS
  Filled 2024-09-07: qty 1

## 2024-09-07 MED ORDER — SODIUM CHLORIDE 0.9 % IV SOLN: INTRAVENOUS | Status: DC

## 2024-09-07 NOTE — Assessment & Plan Note (Signed)
 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  -PET Jan 25, 2024 showed long segment anorectal hypermetabolic activity, and hypermetabolic right inguinal and external iliac lymph nodes metastasis.  No distant metastasis. -she started concurrent chemoradiation with 5-FU and mitomycin  on Jan 31, 2024. -she was hospitalized for pericarditis on 02/02/2024, clinically suspicious for 5-fu induced coronary artery spasm. Chemo held on day 4 -She was admitted to hospital for cycle 2 chemotherapy with cardiology on board, she still developed a significant recurrent chest pain and 5-FU infusion was stopped on day 4 -RT completed on 03/16/24 -PET 06/12/2024 showed resolved hypermetabolic activity in the anus and right inguinal and pelvic lymph nodes, however she has mildly progressive hypermetabolic retroperitoneal and new left [place] adenopathy. Biopsy of the left [place] node confirmed metastatic anal cancer.  -I recommend chemo carbo taxol and immunotherapy zynyz, she started on 07/27/2024

## 2024-09-07 NOTE — Progress Notes (Signed)
 [facility]   [phone] Fax:([phone]   Clinic Follow up Note   Patient Care Team: Marvene Prentice SAUNDERS, FNP as PCP - General (Family Medicine) Shlomo Wilbert SAUNDERS, MD as PCP - Cardiology (Cardiology) Ardis Evalene CROME, RN as Oncology Nurse Navigator Lanny Callander, MD as Consulting Physician (Hematology and Oncology) Legrand Victory CROME DOUGLAS, MD as Consulting Physician (Gastroenterology)  Date of Service:  09/07/2024  CHIEF COMPLAINT: f/u of recurrent anal cancer  CURRENT THERAPY:  First-line chemotherapy carboplatin , paclitaxel  and zynyz   Oncology History   Anal squamous cell carcinoma (HCC) cT2N1M0, stage IIIA -previous history of superficially invasive squamous cell carcinoma of anus, status post surgical resection in August 2023, presented with rectal discomfort for several months, and a palpable 2 to 3 cm mass in the anal canal, biopsy confirmed invasive squamous cell carcinoma in April 2025.  -PET Jan 25, 2024 showed long segment anorectal hypermetabolic activity, and hypermetabolic right inguinal and external iliac lymph nodes metastasis.  No distant metastasis. -she started concurrent chemoradiation with 5-FU and mitomycin  on Jan 31, 2024. -she was hospitalized for pericarditis on 02/02/2024, clinically suspicious for 5-fu induced coronary artery spasm. Chemo held on day 4 -She was admitted to hospital for cycle 2 chemotherapy with cardiology on board, she still developed a significant recurrent chest pain and 5-FU infusion was stopped on day 4 -RT completed on 03/16/24 -PET 06/12/2024 showed resolved hypermetabolic activity in the anus and right inguinal and pelvic lymph nodes, however she has mildly progressive hypermetabolic retroperitoneal and new left [place] adenopathy. Biopsy of the left [place] node confirmed metastatic anal cancer.  -I recommend chemo carbo taxol  and immunotherapy zynyz , she started on 07/27/2024  Assessment & Plan Recurrent anal squamous cell  carcinoma She is undergoing chemotherapy for recurrent anal squamous cell carcinoma, currently in the third week of her cycle, with expected side effects. Plans are in place for interval imaging after the next cycle to assess response, including evaluation for spinal involvement. Circulating tumor DNA was previously positive; repeat testing is planned to monitor disease status. - Schedule treatment break next week. - Order repeat circulating tumor DNA ([employer]) testing next week. - Plan interval imaging after next chemotherapy cycle to assess response - Follow-up with nurse practitioner on January 2nd and me on January 15th.  Chemotherapy-induced anemia She has significant chemotherapy-induced anemia (hemoglobin 6.9 g/dL) with associated fatigue and weakness, consistent with her current treatment phase. Blood transfusion is indicated to improve symptoms and support ongoing therapy. - Schedule blood transfusion (one unit) for December 20th; attempt to arrange for two units if possible, or one unit today and one unit Saturday depending on availability. - Advise her to avoid exertion until transfusion is completed. - Coordinate transfusion logistics with infusion/charging nurse.  Chemotherapy-induced neutropenia She has chemotherapy-induced neutropenia. - Administered white cell growth factor injection as scheduled.  Rectal bleeding She reports intermittent rectal bleeding, typically with bowel movements, likely secondary to hemorrhoids or mucosal tearing exacerbated by chemotherapy-induced mucositis. She also reported a single episode of possible hematuria, source unclear. No evidence of severe or ongoing bleeding. - If rectal bleeding recurs frequently, refer to colorectal surgery for evaluation (including possible fistula or other causes).  Plan - She is tolerating chemotherapy moderately well overall, will proceed cycle 2-day 15 paclitaxel  today - Udenyca  and the 2 units of blood  transfusion on December 20 - She will return in 2 weeks for cycle 3-day 1 treatment. - Plan to repeat a PET scan in 5 weeks -  Will repeat OncDetect on next lab    SUMMARY OF ONCOLOGIC HISTORY: Oncology History  Anal squamous cell carcinoma (HCC)  12/29/2023 Pathology Results   A. PERIANAL, RIGHT POSTERIOR, BIOPSY:       Invasive squamous cell carcinoma, moderately differentiated. Suspicious for lymphovascular invasion.  B. PERIANAL, LEFT POSTERIOR, BIOPSY:       High-grade squamous intraepithelial lesion (HSIL / AIN 3).  No evidence of definitive invasion.    01/20/2024 Initial Diagnosis   Anal cancer (HCC)   01/26/2024 Cancer Staging   Staging form: Anus, AJCC 8th Edition - Clinical: Stage IIIA (cT2, cN1c, cM0) - Signed by Lanny Callander, MD on 01/26/2024   01/31/2024 - 02/28/2024 Chemotherapy   Patient is on Treatment Plan : ANUS Mitomycin  D1,28 + 5FU D1-4, 28-31 q32d     07/27/2024 -  Chemotherapy   Patient is on Treatment Plan : ANUS Retifanlimab  + Paclitaxel  (80) + Carboplatin  (5) / Retifanlimab         Discussed the use of AI scribe software for clinical note transcription with the patient, who gave verbal consent to proceed.  History of Present Illness Andrea [name] is a 68 year old female with recurrent anal squamous cell carcinoma who presents for follow-up of chemotherapy-induced cytopenias and rectal bleeding.  She is in the final week of a Taxol -based chemotherapy cycle. She has persistent fatigue and significant lower extremity weakness with intermittent brief improvement, requiring daytime naps. Her hemoglobin is 6.9 g/dL and she is scheduled for a blood transfusion later this week. She is not on anticoagulation and eats meat regularly for iron intake.  She has intermittent rectal bleeding with blood on tissue and in the toilet after bowel movements, with associated discomfort and mild pain that is less severe than during prior radiation therapy. She believes this is from  hemorrhoids or mucosal tearing. Since her last visit she has had one episode of hematuria and one episode of hematochezia without increased frequency. She denies fever, chills, bowel habit changes, or colitis flare.  She denies infectious symptoms. Circulating tumor DNA testing was previously positive and repeat testing is planned.     All other systems were reviewed with the patient and are negative.  MEDICAL HISTORY:  Past Medical History:  Diagnosis Date   Anemia    Anginal pain    Arthritis    Chest discomfort    Disorder of immune system    Dyspnea    Dyspnea on exertion    Hypercholesterolemia    Hypertension    Migraine    Non-ulcer dyspepsia    Osteopenia of spine    Peptic ulcer disease    Pneumonia    Polyp of rectum    cancerous   Pre-diabetes    Skin cancer    Ulcerative colitis (HCC)     SURGICAL HISTORY: Past Surgical History:  Procedure Laterality Date   ANKLE FRACTURE SURGERY Left    CARDIAC CATHETERIZATION     COLONOSCOPY     ESOPHAGOGASTRODUODENOSCOPY     hip replacement Bilateral    IR IMAGING GUIDED PORT INSERTION  07/24/2024   LEFT HEART CATH AND CORONARY ANGIOGRAPHY N/A 01/21/2022   Procedure: LEFT HEART CATH AND CORONARY ANGIOGRAPHY;  Surgeon: Verlin Lonni JONETTA, MD;  Location: [facility];  Service: Cardiovascular;  Laterality: N/A;   LEFT HEART CATH AND CORONARY ANGIOGRAPHY N/A 02/03/2024   Procedure: LEFT HEART CATH AND CORONARY ANGIOGRAPHY;  Surgeon: Darron Deatrice LABOR, MD;  Location: [facility];  Service: Cardiovascular;  Laterality:  N/A;   RECTAL BIOPSY N/A 05/04/2022   Procedure: BIOPSY RECTAL VS EXCISION OF PERIANAL LESION UNDER ANOSCOPY;  Surgeon: Teresa Lonni HERO, MD;  Location: [facility];  Service: General;  Laterality: N/A;   TOTAL KNEE ARTHROPLASTY Left 10/19/2022   Procedure: LEFT TOTAL KNEE ARTHROPLASTY;  Surgeon: Jerri Kay HERO, MD;  Location: [facility];  Service: Orthopedics;  Laterality: Left;   TUBAL LIGATION      TUMOR EXCISION N/A 01/14/2024   Procedure: TRANSRECTAL TUMOR EXCISION;  Surgeon: Teresa Lonni HERO, MD;  Location: [facility];  Service: General;  Laterality: N/A;  Transanal excision of distal rectal and anal lesion (wide local)    I have reviewed the social history and family history with the patient and they are unchanged from previous note.  ALLERGIES:  is allergic to codeine.  MEDICATIONS:  Current Outpatient Medications  Medication Sig Dispense Refill   acetaminophen  (TYLENOL ) 500 MG tablet Take 1,000 mg by mouth every 6 (six) hours as needed for moderate pain.     albuterol  (VENTOLIN  HFA) 108 (90 Base) MCG/ACT inhaler Inhale 1-2 puffs into the lungs every 4 (four) hours as needed for wheezing or shortness of breath. 8 g 0   amLODipine  (NORVASC ) 5 MG tablet Take 1 tablet (5 mg total) by mouth daily. 90 tablet 3   ammonium lactate (LAC-HYDRIN) 12 % lotion Apply 1 Application topically daily.     atorvastatin  (LIPITOR) 80 MG tablet TAKE ONE TABLET BY MOUTH DAILY 90 tablet 1   ezetimibe  (ZETIA ) 10 MG tablet TAKE 1 TABLET BY MOUTH DAILY 90 tablet 3   famotidine  (PEPCID ) 20 MG tablet Take 1 tablet (20 mg total) by mouth 2 (two) times daily as needed for heartburn or indigestion. 60 tablet 1   fluticasone  (FLONASE ) 50 MCG/ACT nasal spray Place 2 sprays into both nostrils as needed for allergies or rhinitis.     ivermectin (STROMECTOL) 3 MG TABS tablet Take 12 mg by mouth once.     lidocaine -prilocaine  (EMLA ) cream Apply to affected area once 30 g 3   nitroGLYCERIN  (NITROSTAT ) 0.4 MG SL tablet Place 1 tablet (0.4 mg total) under the tongue every 5 (five) minutes as needed for chest pain. 20 tablet 12   PARoxetine  (PAXIL ) 20 MG tablet Take 10 mg by mouth daily.     SUMAtriptan (IMITREX) 25 MG tablet Take 25 mg by mouth every 2 (two) hours as needed for migraine.     vedolizumab  (ENTYVIO ) 300 MG injection Inject 300 mg into the vein every 8 (eight) weeks.     ALPRAZolam  (XANAX )  0.25 MG tablet Take 1 tablet (0.25 mg total) by mouth 2 (two) times daily as needed for anxiety. 30 tablet 0   hydrocortisone  (ANUSOL -HC) 25 MG suppository Place 1 suppository (25 mg total) rectally 2 (two) times daily. 12 suppository 2   ondansetron  (ZOFRAN ) 8 MG tablet Take 1 tablet (8 mg total) by mouth every 8 (eight) hours as needed for nausea or vomiting. Start on the third day after carboplatin . 30 tablet 1   oxyCODONE  (OXY IR/ROXICODONE ) 5 MG immediate release tablet Take 0.5-1 tablets (2.5-5 mg total) by mouth every 4 (four) hours as needed for severe pain (pain score 7-10). 60 tablet 0   prochlorperazine  (COMPAZINE ) 10 MG tablet Take 1 tablet (10 mg total) by mouth every 6 (six) hours as needed for nausea or vomiting. 30 tablet 1   silver  sulfADIAZINE  (SILVADENE ) 1 % cream Apply 1 Application topically 3 (three) times daily as needed. Apply  to buttocks area as needed 50 g 1   No current facility-administered medications for this visit.   Facility-Administered Medications Ordered in Other Visits  Medication Dose Route Frequency Provider Last Rate Last Admin   0.9 %  sodium chloride  infusion   Intravenous Continuous Lanny Callander, MD 10 mL/hr at 09/07/24 1347 New Bag at 09/07/24 1347   PACLitaxel  (TAXOL ) 150 mg in sodium chloride  0.9 % 250 mL chemo infusion (</= 80mg /m2)  80 mg/m2 (Treatment Plan Recorded) Intravenous Once Lanny Callander, MD        PHYSICAL EXAMINATION: ECOG PERFORMANCE STATUS: 2 - Symptomatic, <50% confined to bed  Vitals:   09/07/24 1309  BP: (!) 146/71  Pulse: 86  Resp: 17  Temp: 97.7 F (36.5 C)  SpO2: 98%   Wt Readings from Last 3 Encounters:  09/07/24 154 lb 11.2 oz (70.2 kg)  09/05/24 150 lb 6.4 oz (68.2 kg)  08/31/24 151 lb 4 oz (68.6 kg)     GENERAL:alert, no distress and comfortable SKIN: skin color, texture, turgor are normal, no rashes or significant lesions EYES: normal, Conjunctiva are pink and non-injected, sclera clear NECK: supple, thyroid  normal  size, non-tender, without nodularity LYMPH:  no palpable lymphadenopathy in the cervical, axillary  LUNGS: clear to auscultation and percussion with normal breathing effort HEART: regular rate & rhythm and no murmurs and no lower extremity edema ABDOMEN:abdomen soft, non-tender and normal bowel sounds Musculoskeletal:no cyanosis of digits and no clubbing  NEURO: alert & oriented x 3 with fluent speech, no focal motor/sensory deficits  Physical Exam    LABORATORY DATA:  I have reviewed the data as listed    Latest Ref Rng & Units 09/07/2024   12:38 PM 08/31/2024   12:31 PM 08/24/2024    9:22 AM  CBC  WBC 4.0 - 10.5 K/uL 1.9  2.4  4.3   Hemoglobin 12.0 - 15.0 g/dL 6.9  7.9  8.6   Hematocrit 36.0 - 46.0 % 20.3  23.1  25.8   Platelets 150 - 400 K/uL 136  169  133         Latest Ref Rng & Units 09/07/2024   12:38 PM 08/31/2024   12:31 PM 08/24/2024    9:22 AM  CMP  Glucose 70 - 99 mg/dL 95  94  899   BUN 8 - 23 mg/dL 16  17  17    Creatinine 0.44 - 1.00 mg/dL 9.26  9.31  9.34   Sodium 135 - 145 mmol/L 139  139  140   Potassium 3.5 - 5.1 mmol/L 3.9  3.9  3.8   Chloride 98 - 111 mmol/L 107  107  107   CO2 22 - 32 mmol/L 23  22  24    Calcium  8.9 - 10.3 mg/dL 9.4  9.4  9.5   Total Protein 6.5 - 8.1 g/dL 6.6  6.8  6.9   Total Bilirubin 0.0 - 1.2 mg/dL 0.4  0.4  0.4   Alkaline Phos 38 - 126 U/L 69  74  82   AST 15 - 41 U/L 32  29  23   ALT 0 - 44 U/L 20  21  16        RADIOGRAPHIC STUDIES: I have personally reviewed the radiological images as listed and agreed with the findings in the report. No results found.    Orders Placed This Encounter  Procedures   Care order/instruction    Transfuse Parameters    Standing Status:   Future    Expiration  Date:   09/07/2025   Informed Consent Details: Physician/Practitioner Attestation; Transcribe to consent form and obtain patient signature    Physician/Practitioner attestation of informed consent for blood and or blood product  transfusion:   I, the physician/practitioner, attest that I have discussed with the patient the benefits, risks, side effects, alternatives, likelihood of achieving goals and potential problems during recovery for the procedure that I have provided informed consent.    Product(s):   All Product(s)   Type and screen         Standing Status:   Future    Number of Occurrences:   1    Expected Date:   09/07/2024    Expiration Date:   09/07/2025   Prepare RBC (crossmatch)    Standing Status:   Standing    Number of Occurrences:   1    # of Units:   2 units    Transfusion Indications:   Hemoglobin < 7 gm/dL and symptomatic    Number of Units to Keep Ahead:   NO units ahead    Instructions::   Transfuse    If emergent release call blood bank:   [facility] Law [phone]   All questions were answered. The patient knows to call the clinic with any problems, questions or concerns. No barriers to learning was detected. The total time spent in the appointment was 30 minutes, including review of chart and various tests results, discussions about plan of care and coordination of care plan     Onita Mattock, MD 09/07/2024

## 2024-09-07 NOTE — Patient Instructions (Signed)

## 2024-09-07 NOTE — Progress Notes (Signed)
 Critical lab value reported Hbg 6.9  Dr Lanny made aware and pt is scheduled to get blood transfusion on 09/09/2024

## 2024-09-09 ENCOUNTER — Inpatient Hospital Stay

## 2024-09-09 VITALS — BP 138/72 | HR 85 | Temp 98.0°F | Resp 18

## 2024-09-09 DIAGNOSIS — D649 Anemia, unspecified: Secondary | ICD-10-CM

## 2024-09-09 DIAGNOSIS — C210 Malignant neoplasm of anus, unspecified: Secondary | ICD-10-CM

## 2024-09-09 DIAGNOSIS — Z5111 Encounter for antineoplastic chemotherapy: Secondary | ICD-10-CM | POA: Diagnosis not present

## 2024-09-09 DIAGNOSIS — D6481 Anemia due to antineoplastic chemotherapy: Secondary | ICD-10-CM

## 2024-09-09 DIAGNOSIS — T451X5A Adverse effect of antineoplastic and immunosuppressive drugs, initial encounter: Secondary | ICD-10-CM

## 2024-09-09 MED ORDER — SODIUM CHLORIDE 0.9% IV SOLUTION
250.0000 mL | INTRAVENOUS | Status: DC
  Administered 2024-09-09: 100 mL via INTRAVENOUS

## 2024-09-09 MED ORDER — PEGFILGRASTIM-CBQV 6 MG/0.6ML ~~LOC~~ SOSY
6.0000 mg | PREFILLED_SYRINGE | Freq: Once | SUBCUTANEOUS | Status: AC
  Administered 2024-09-09: 6 mg via SUBCUTANEOUS
  Filled 2024-09-09: qty 0.6

## 2024-09-09 NOTE — Patient Instructions (Signed)

## 2024-09-11 ENCOUNTER — Telehealth: Payer: Self-pay | Admitting: *Deleted

## 2024-09-11 ENCOUNTER — Encounter: Payer: Self-pay | Admitting: Hematology

## 2024-09-11 LAB — BPAM RBC
Blood Product Expiration Date: 202601162359
Blood Product Unit Number: 202601152359
ISSUE DATE / TIME: 202512200848
PRODUCT CODE: 202512200848
PRODUCT CODE: 202601162359
Unit Type and Rh: 5100
Unit Type and Rh: 202601152359
Unit Type and Rh: 5100
Unit Type and Rh: 5100

## 2024-09-11 LAB — TYPE AND SCREEN
ABO/RH(D): O POS
Antibody Screen: NEGATIVE
Unit division: 0
Unit division: 0

## 2024-09-11 NOTE — Telephone Encounter (Signed)
 Called Dr. Lonni White's office to schedule an appointment @ [phone].  Spoke with [facility] and states patient is already scheduled for appointment on 10/25/24 @ 1530.  Patient is to arrive @1515 .  States the appointment should show up in patient's [facility] My Chart.

## 2024-09-12 ENCOUNTER — Other Ambulatory Visit: Payer: Self-pay

## 2024-09-12 DIAGNOSIS — C210 Malignant neoplasm of anus, unspecified: Secondary | ICD-10-CM

## 2024-09-12 NOTE — Progress Notes (Signed)
 Verbal order w/readback order from Dr. Lanny for Oncodetect to be drawn. Order placed in [employer] and in [employer] portal.  Oncodetect kit and requisition given to [employer].

## 2024-09-13 ENCOUNTER — Other Ambulatory Visit: Payer: Self-pay

## 2024-09-19 ENCOUNTER — Other Ambulatory Visit: Payer: Self-pay

## 2024-09-21 ENCOUNTER — Other Ambulatory Visit: Payer: Self-pay | Admitting: Nurse Practitioner

## 2024-09-21 DIAGNOSIS — D6481 Anemia due to antineoplastic chemotherapy: Secondary | ICD-10-CM

## 2024-09-21 DIAGNOSIS — C210 Malignant neoplasm of anus, unspecified: Secondary | ICD-10-CM

## 2024-09-21 NOTE — Assessment & Plan Note (Addendum)
 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  -PET Jan 25, 2024 showed long segment anorectal hypermetabolic activity, and hypermetabolic right inguinal and external iliac lymph nodes metastasis.  No distant metastasis. -she started concurrent chemoradiation with 5-FU and mitomycin  on Jan 31, 2024. -she was hospitalized for pericarditis on 02/02/2024, clinically suspicious for 5-fu induced coronary artery spasm. Chemo held on day 4 -She was admitted to hospital for cycle 2 chemotherapy with cardiology on board, she still developed a significant recurrent chest pain and 5-FU infusion was stopped on day 4 -RT completed on 03/16/24 -PET 06/12/2024 showed resolved hypermetabolic activity in the anus and right inguinal and pelvic lymph nodes, however she has mildly progressive hypermetabolic retroperitoneal and new left [place] adenopathy. Biopsy of the left [place] node confirmed metastatic anal cancer.  -I recommend chemo carbo taxol and immunotherapy zynyz, she started on 07/27/2024

## 2024-09-21 NOTE — Progress Notes (Signed)
 " Patient Care Team: Marvene Prentice SAUNDERS, FNP as PCP - General (Family Medicine) Shlomo Wilbert SAUNDERS, MD as PCP - Cardiology (Cardiology) Ardis, Evalene CROME, RN as Oncology Nurse Navigator Lanny Callander, MD as Consulting Physician (Hematology and Oncology) Legrand Victory CROME DOUGLAS, MD as Consulting Physician (Gastroenterology)  Clinic Day:  09/22/2024  Referring physician: Lanny Callander, MD  ASSESSMENT & PLAN:   Assessment & Plan: Anal squamous cell carcinoma (HCC) cT2N1M0, stage IIIA -previous history of superficially invasive squamous cell carcinoma of anus, status post surgical resection in August 2023, presented with rectal discomfort for several months, and a palpable 2 to 3 cm mass in the anal canal, biopsy confirmed invasive squamous cell carcinoma in April 2025.  -PET Jan 25, 2024 showed long segment anorectal hypermetabolic activity, and hypermetabolic right inguinal and external iliac lymph nodes metastasis.  No distant metastasis. -Andrea [name] started concurrent chemoradiation with 5-FU and mitomycin  on Jan 31, 2024. -Andrea [name] was hospitalized for pericarditis on 02/02/2024, clinically suspicious for 5-fu induced coronary artery spasm. Chemo held on day 4 -Andrea [name] was admitted to hospital for cycle 2 chemotherapy with cardiology on board, Andrea [name] still developed a significant recurrent chest pain and 5-FU infusion was stopped on day 4 -RT completed on 03/16/24 -PET 06/12/2024 showed resolved hypermetabolic activity in the anus and right inguinal and pelvic lymph nodes, however Andrea [name] has mildly progressive hypermetabolic retroperitoneal and new left [place] adenopathy. Biopsy of the left [place] node confirmed metastatic anal cancer.  -I recommend chemo carbo taxol  and immunotherapy zynyz , Andrea [name] started on 07/27/2024   Metastatic anal squamous cell carcinoma Currently receiving chemotherapy carbo, Taxol , and Zynyz .  Today is cycle 3-day 1 of treatment.  Reports improved fatigue.  Had family at Andrea [name] home during the holidays.  Was able to keep up  with grandchildren.  Cooked meals without difficulty.  Andrea [name] does report having some blood in Andrea [name] stool still.  This is unchanged.  Andrea [name] does have appoint with Dr. Teresa coming up on 10/25/2024.  Denies new symptoms.  Abnormal weight loss Patient reports feeling okay with fair appetite.  Andrea [name] has been trying to add protein powder to foods when possible.  Andrea [name] has had 8 pound weight loss.  Will meet with nutrition services to help manage caloric and protein needs during chemotherapy.  Anemia Improved and stable anemia today.  Hgb 10.2 and HCT 36.0.  No requirement for blood transfusion or iron infusion.  Will monitor prior to every treatment.  Plan Labs reviewed. - Mild anemia.  Mild thrombocytopenia. - Unremarkable CMP. Scheduled for surgical follow-up with Dr. Teresa on 10/25/2024. Labs and patient presentation are appropriate for treatment today. Proceed with cycle 3 day 1 chemotherapy carbo, Taxol , and Zynyz .  Labs/flush, follow-up, and treatment as scheduled.  The patient understands the plans discussed today and is in agreement with them.  Andrea [name] knows to contact our office if Andrea [name] develops concerns prior to Andrea [name] next appointment.  I provided 25 minutes of face-to-face time during this encounter and > 50% was spent counseling as documented under my assessment and plan.    Powell FORBES Lessen, NP  [facility] - A DEPT [facility] [address] [place] [address] Dept: [phone] Dept Fax: [phone]   Orders Placed This Encounter  Procedures   Miscellaneous test (send-out)      CHIEF COMPLAINT:  CC: Anal squamous cell carcinoma  Current Treatment: First-line chemotherapy carboplatin , paclitaxel , and  Zynyz   INTERVAL HISTORY:  Andrea [name] is here today for repeat clinical assessment.  Andrea [name] last saw Dr. Lanny on 09/07/2024.  Andrea [name] required a Udenyca  and 2 units of blood due to significant neutropenia and anemia from  chemotherapy.  Today, Andrea [name] presents for Cycle 3 day 1 chemotherapy carboplatin  and paclitaxel , and immunotherapy, Zynyz .  Andrea [name] reports feeling okay in general.  Has started adding protein powder to most foods, as Andrea [name] has been unable to tolerate protein shakes.  Reports improved energy.  Andrea [name] stool.  Denies nausea or vomiting.  Denies diarrhea.  Andrea [name] denies peripheral neuropathy.  Andrea [name] denies chest pain, chest pressure, or shortness of breath. Andrea [name] denies headaches or visual disturbances. He denies abdominal pain, nausea, vomiting, or changes in bowel or bladder habits.  Andrea [name] denies fevers or chills. Andrea [name] denies pain. Andrea [name] appetite is fair. Andrea [name] weight has decreased 8 pounds over last few weeks.  I have reviewed the past medical history, past surgical history, social history and family history with the patient and they are unchanged from previous note.  ALLERGIES:  is allergic to codeine.  MEDICATIONS:  Current Outpatient Medications  Medication Sig Dispense Refill   acetaminophen  (TYLENOL ) 500 MG tablet Take 1,000 mg by mouth every 6 (six) hours as needed for moderate pain.     albuterol  (VENTOLIN  HFA) 108 (90 Base) MCG/ACT inhaler Inhale 1-2 puffs into the lungs every 4 (four) hours as needed for wheezing or shortness of breath. 8 g 0   amLODipine  (NORVASC ) 5 MG tablet Take 1 tablet (5 mg total) by mouth daily. 90 tablet 3   ammonium lactate (LAC-HYDRIN) 12 % lotion Apply 1 Application topically daily.     atorvastatin  (LIPITOR) 80 MG tablet TAKE ONE TABLET BY MOUTH DAILY 90 tablet 1   ezetimibe  (ZETIA ) 10 MG tablet TAKE 1 TABLET BY MOUTH DAILY 90 tablet 3   famotidine  (PEPCID ) 20 MG tablet Take 1 tablet (20 mg total) by mouth 2 (two) times daily as needed for heartburn or indigestion. 60 tablet 1   fluticasone  (FLONASE ) 50 MCG/ACT nasal spray Place 2 sprays into both nostrils as needed for allergies or rhinitis.     ivermectin (STROMECTOL) 3 MG TABS tablet Take 12 mg by mouth once.     lidocaine -prilocaine   (EMLA ) cream Apply to affected area once 30 g 3   nitroGLYCERIN  (NITROSTAT ) 0.4 MG SL tablet Place 1 tablet (0.4 mg total) under the tongue every 5 (five) minutes as needed for chest pain. 20 tablet 12   PARoxetine  (PAXIL ) 20 MG tablet Take 10 mg by mouth daily.     SUMAtriptan (IMITREX) 25 MG tablet Take 25 mg by mouth every 2 (two) hours as needed for migraine.     vedolizumab  (ENTYVIO ) 300 MG injection Inject 300 mg into the vein every 8 (eight) weeks.     vedolizumab  (ENTYVIO ) 300 MG injection Inject 300 mg into the vein as directed.     ALPRAZolam  (XANAX ) 0.25 MG tablet Take 1 tablet (0.25 mg total) by mouth 2 (two) times daily as needed for anxiety. 30 tablet 0   hydrocortisone  (ANUSOL -HC) 25 MG suppository Place 1 suppository (25 mg total) rectally 2 (two) times daily. 12 suppository 2   ondansetron  (ZOFRAN ) 8 MG tablet Take 1 tablet (8 mg total) by mouth every 8 (eight) hours as needed for nausea or vomiting. Start on the third day after carboplatin . 30 tablet 1   oxyCODONE  (OXY IR/ROXICODONE ) 5 MG immediate release tablet Take 0.5-1 tablets (2.5-5 mg total) by mouth every 4 (four) hours as  needed for severe pain (pain score 7-10). 60 tablet 0   prochlorperazine  (COMPAZINE ) 10 MG tablet Take 1 tablet (10 mg total) by mouth every 6 (six) hours as needed for nausea or vomiting. 30 tablet 1   silver  sulfADIAZINE  (SILVADENE ) 1 % cream Apply 1 Application topically 3 (three) times daily as needed. Apply to buttocks area as needed 50 g 1   No current facility-administered medications for this visit.    HISTORY OF PRESENT ILLNESS:   Oncology History  Anal squamous cell carcinoma (HCC)  12/29/2023 Pathology Results   A. PERIANAL, RIGHT POSTERIOR, BIOPSY:       Invasive squamous cell carcinoma, moderately differentiated. Suspicious for lymphovascular invasion.  B. PERIANAL, LEFT POSTERIOR, BIOPSY:       High-grade squamous intraepithelial lesion (HSIL / AIN 3).  No evidence of definitive  invasion.    01/20/2024 Initial Diagnosis   Anal cancer (HCC)   01/26/2024 Cancer Staging   Staging form: Anus, AJCC 8th Edition - Clinical: Stage IIIA (cT2, cN1c, cM0) - Signed by Lanny Callander, MD on 01/26/2024   01/31/2024 - 02/28/2024 Chemotherapy   Patient is on Treatment Plan : ANUS Mitomycin  D1,28 + 5FU D1-4, 28-31 q32d     07/27/2024 -  Chemotherapy   Patient is on Treatment Plan : ANUS Retifanlimab  + Paclitaxel  (80) + Carboplatin  (5) / Retifanlimab          REVIEW OF SYSTEMS:   Constitutional: Denies fevers, chills or night sweats  Eyes: Denies blurriness of vision Ears, nose, mouth, throat, and face: Denies mucositis or sore throat Respiratory: Denies cough, dyspnea or wheezes Cardiovascular: Denies palpitation, chest discomfort or lower extremity swelling Gastrointestinal:  Denies nausea, heartburn or change in bowel habits. Continues to note presence of blood in Andrea [name] stool.  Skin: Denies abnormal skin rashes Lymphatics: Denies new lymphadenopathy or easy bruising Neurological:Denies numbness, tingling or new weaknesses Behavioral/Psych: Mood is stable, no new changes  All other systems were reviewed with the patient and are negative.   VITALS:   Today's Vitals   09/22/24 0945 09/22/24 1000  BP: (!) 146/70   Pulse: 95   Resp: 17   Temp: 98.8 F (37.1 C)   SpO2: 98%   Weight: 146 lb (66.2 kg)   Height: 5' 5 (1.651 m)   PainSc:  0-No pain   Body mass index is 24.3 kg/m.    Wt Readings from Last 3 Encounters:  10/05/24 148 lb (67.1 kg)  09/28/24 146 lb 6.4 oz (66.4 kg)  09/22/24 146 lb (66.2 kg)    Body mass index is 24.3 kg/m.  Performance status (ECOG): 1 - Symptomatic but completely ambulatory  PHYSICAL EXAM:   GENERAL:alert, no distress and comfortable SKIN: skin color, texture, turgor are normal, no rashes or significant lesions EYES: normal, Conjunctiva are pink and non-injected, sclera clear OROPHARYNX:no exudate, no erythema and lips, buccal mucosa,  and tongue normal  NECK: supple, thyroid  normal size, non-tender, without nodularity LYMPH:  no palpable lymphadenopathy in the cervical, axillary or inguinal LUNGS: clear to auscultation and percussion with normal breathing effort HEART: regular rate & rhythm and no murmurs and no lower extremity edema ABDOMEN:abdomen soft, non-tender and normal bowel sounds Musculoskeletal:no cyanosis of digits and no clubbing  NEURO: alert & oriented x 3 with fluent speech, no focal motor/sensory deficits  LABORATORY DATA:  I have reviewed the data as listed    Component Value Date/Time   NA 140 10/05/2024 0901   NA 140 01/15/2022 0907   K 4.0  10/05/2024 0901   CL 106 10/05/2024 0901   CO2 22 10/05/2024 0901   GLUCOSE 95 10/05/2024 0901   BUN 22 10/05/2024 0901   BUN 15 01/15/2022 0907   CREATININE 0.70 10/05/2024 0901   CALCIUM  9.3 10/05/2024 0901   PROT 6.6 10/05/2024 0901   PROT 6.8 10/10/2019 1030   ALBUMIN 4.1 10/05/2024 0901   ALBUMIN 4.4 10/10/2019 1030   AST 25 10/05/2024 0901   ALT 20 10/05/2024 0901   ALKPHOS 72 10/05/2024 0901   BILITOT 0.5 10/05/2024 0901   GFRNONAA >60 10/05/2024 0901   GFRAA 84 07/06/2019 1601     Lab Results  Component Value Date   WBC 1.9 (L) 10/05/2024   NEUTROABS 0.9 (L) 10/05/2024   HGB 8.4 (L) 10/05/2024   HCT 24.3 (L) 10/05/2024   MCV 87.4 10/05/2024   PLT 97 (L) 10/05/2024    Iron/TIBC/Ferritin/ %Sat    Component Value Date/Time   FERRITIN 461 (H) 09/28/2024 0819    "

## 2024-09-22 ENCOUNTER — Inpatient Hospital Stay

## 2024-09-22 ENCOUNTER — Inpatient Hospital Stay: Admitting: Nurse Practitioner

## 2024-09-22 ENCOUNTER — Inpatient Hospital Stay: Attending: Hematology

## 2024-09-22 VITALS — BP 146/70 | HR 95 | Temp 98.8°F | Resp 17 | Ht 65.0 in | Wt 146.0 lb

## 2024-09-22 DIAGNOSIS — Z5189 Encounter for other specified aftercare: Secondary | ICD-10-CM | POA: Insufficient documentation

## 2024-09-22 DIAGNOSIS — D649 Anemia, unspecified: Secondary | ICD-10-CM | POA: Insufficient documentation

## 2024-09-22 DIAGNOSIS — Z79899 Other long term (current) drug therapy: Secondary | ICD-10-CM | POA: Diagnosis not present

## 2024-09-22 DIAGNOSIS — C210 Malignant neoplasm of anus, unspecified: Secondary | ICD-10-CM | POA: Diagnosis not present

## 2024-09-22 DIAGNOSIS — D6481 Anemia due to antineoplastic chemotherapy: Secondary | ICD-10-CM

## 2024-09-22 DIAGNOSIS — Z5112 Encounter for antineoplastic immunotherapy: Secondary | ICD-10-CM | POA: Diagnosis present

## 2024-09-22 DIAGNOSIS — C778 Secondary and unspecified malignant neoplasm of lymph nodes of multiple regions: Secondary | ICD-10-CM | POA: Insufficient documentation

## 2024-09-22 DIAGNOSIS — Z79620 Long term (current) use of immunosuppressive biologic: Secondary | ICD-10-CM | POA: Insufficient documentation

## 2024-09-22 DIAGNOSIS — Z5111 Encounter for antineoplastic chemotherapy: Secondary | ICD-10-CM | POA: Insufficient documentation

## 2024-09-22 LAB — CBC WITH DIFFERENTIAL (CANCER CENTER ONLY)
Abs Immature Granulocytes: 0.01 K/uL (ref 0.00–0.07)
Basophils Absolute: 0 K/uL (ref 0.0–0.1)
Basophils Relative: 1 %
Eosinophils Absolute: 0.2 K/uL (ref 0.0–0.5)
Eosinophils Relative: 3 %
HCT: 30 % — ABNORMAL LOW (ref 36.0–46.0)
Hemoglobin: 10.2 g/dL — ABNORMAL LOW (ref 12.0–15.0)
Immature Granulocytes: 0 %
Lymphocytes Relative: 16 %
Lymphs Abs: 0.8 K/uL (ref 0.7–4.0)
MCH: 29.6 pg (ref 26.0–34.0)
MCHC: 34 g/dL (ref 30.0–36.0)
MCV: 87 fL (ref 80.0–100.0)
Monocytes Absolute: 0.6 K/uL (ref 0.1–1.0)
Monocytes Relative: 12 %
Neutro Abs: 3.3 K/uL (ref 1.7–7.7)
Neutrophils Relative %: 68 %
Platelet Count: 100 K/uL — ABNORMAL LOW (ref 150–400)
RBC: 3.45 MIL/uL — ABNORMAL LOW (ref 3.87–5.11)
RDW: 18.6 % — ABNORMAL HIGH (ref 11.5–15.5)
WBC Count: 4.8 K/uL (ref 4.0–10.5)
nRBC: 0 % (ref 0.0–0.2)

## 2024-09-22 LAB — CMP (CANCER CENTER ONLY)
ALT: 20 U/L (ref 0–44)
AST: 25 U/L (ref 15–41)
Albumin: 4.1 g/dL (ref 3.5–5.0)
Alkaline Phosphatase: 85 U/L (ref 38–126)
Anion gap: 9 (ref 5–15)
BUN: 18 mg/dL (ref 8–23)
CO2: 23 mmol/L (ref 22–32)
Calcium: 9.5 mg/dL (ref 8.9–10.3)
Chloride: 107 mmol/L (ref 98–111)
Creatinine: 0.7 mg/dL (ref 0.44–1.00)
GFR, Estimated: >60 mL/min
Glucose, Bld: 105 mg/dL — ABNORMAL HIGH (ref 70–99)
Potassium: 3.8 mmol/L (ref 3.5–5.1)
Sodium: 139 mmol/L (ref 135–145)
Total Bilirubin: 0.5 mg/dL (ref 0.0–1.2)
Total Protein: 6.8 g/dL (ref 6.5–8.1)

## 2024-09-22 LAB — TSH: TSH: 1.99 u[IU]/mL (ref 0.350–4.500)

## 2024-09-22 LAB — SAMPLE TO BLOOD BANK

## 2024-09-22 LAB — MISCELLANEOUS TEST

## 2024-09-22 MED ORDER — SODIUM CHLORIDE 0.9% FLUSH: 10.0000 mL | INTRAVENOUS | Status: DC | PRN

## 2024-09-22 MED ORDER — FAMOTIDINE IN NACL 20-0.9 MG/50ML-% IV SOLN
20.0000 mg | Freq: Once | INTRAVENOUS | Status: AC
  Administered 2024-09-22: 20 mg via INTRAVENOUS
  Filled 2024-09-22: qty 50

## 2024-09-22 MED ORDER — SODIUM CHLORIDE 0.9 % IV SOLN
80.0000 mg/m2 | Freq: Once | INTRAVENOUS | Status: AC
  Administered 2024-09-22: 150 mg via INTRAVENOUS
  Filled 2024-09-22: qty 25

## 2024-09-22 MED ORDER — SODIUM CHLORIDE 0.9 % IV SOLN
391.0500 mg | Freq: Once | INTRAVENOUS | Status: AC
  Administered 2024-09-22: 390 mg via INTRAVENOUS
  Filled 2024-09-22: qty 39

## 2024-09-22 MED ORDER — SODIUM CHLORIDE 0.9 % IV SOLN: INTRAVENOUS | Status: DC

## 2024-09-22 MED ORDER — SODIUM CHLORIDE 0.9 % IV SOLN
500.0000 mg | Freq: Once | INTRAVENOUS | Status: AC
  Administered 2024-09-22: 500 mg via INTRAVENOUS
  Filled 2024-09-22: qty 20

## 2024-09-22 MED ORDER — DIPHENHYDRAMINE HCL 50 MG/ML IJ SOLN
25.0000 mg | Freq: Once | INTRAMUSCULAR | Status: AC
  Administered 2024-09-22: 25 mg via INTRAVENOUS
  Filled 2024-09-22: qty 1

## 2024-09-22 MED ORDER — APREPITANT 130 MG/18ML IV EMUL
130.0000 mg | Freq: Once | INTRAVENOUS | Status: AC
  Administered 2024-09-22: 130 mg via INTRAVENOUS
  Filled 2024-09-22: qty 18

## 2024-09-22 MED ORDER — PALONOSETRON HCL INJECTION 0.25 MG/5ML
0.2500 mg | Freq: Once | INTRAVENOUS | Status: AC
  Administered 2024-09-22: 0.25 mg via INTRAVENOUS
  Filled 2024-09-22: qty 5

## 2024-09-22 NOTE — Patient Instructions (Signed)
 [facility] - A DEPT OF [facility]  Discharge Instructions: Thank you for choosing [facility] to provide your oncology and hematology care.   If you have a lab appointment with the Cancer Center, please go directly to the Cancer Center and check in at the registration area.   Wear comfortable clothing and clothing appropriate for easy access to any Portacath or PICC line.   We strive to give you quality time with your provider. You may need to reschedule your appointment if you arrive late (15 or more minutes).  Arriving late affects you and other patients whose appointments are after yours.  Also, if you miss three or more appointments without notifying the office, you may be dismissed from the clinic at the providers discretion.      For prescription refill requests, have your pharmacy contact our office and allow 72 hours for refills to be completed.    Today you received the following chemotherapy and/or immunotherapy agents zynyz  taxol  and carboplatin       To help prevent nausea and vomiting after your treatment, we encourage you to take your nausea medication as directed.  BELOW ARE SYMPTOMS THAT SHOULD BE REPORTED IMMEDIATELY: *FEVER GREATER THAN 100.4 F (38 C) OR HIGHER *CHILLS OR SWEATING *NAUSEA AND VOMITING THAT IS NOT CONTROLLED WITH YOUR NAUSEA MEDICATION *UNUSUAL SHORTNESS OF BREATH *UNUSUAL BRUISING OR BLEEDING *URINARY PROBLEMS (pain or burning when urinating, or frequent urination) *BOWEL PROBLEMS (unusual diarrhea, constipation, pain near the anus) TENDERNESS IN MOUTH AND THROAT WITH OR WITHOUT PRESENCE OF ULCERS (sore throat, sores in mouth, or a toothache) UNUSUAL RASH, SWELLING OR PAIN  UNUSUAL VAGINAL DISCHARGE OR ITCHING   Items with * indicate a potential emergency and should be followed up as soon as possible or go to the Emergency Department if any problems should occur.  Please show the CHEMOTHERAPY ALERT CARD  or IMMUNOTHERAPY ALERT CARD at check-in to the Emergency Department and triage nurse.  Should you have questions after your visit or need to cancel or reschedule your appointment, please contact [facility] - [facility] DEPT [facility]  Dept: [phone]  and follow the prompts.  Office hours are 8:00 a.m. to 4:30 p.m. Monday - Friday. Please note that voicemails left after 4:00 p.m. may not be returned until the following business day.  We are closed weekends and major holidays. You have access to a nurse at all times for urgent questions. Please call the main number to the clinic Dept: [phone] and follow the prompts.   For any non-urgent questions, you may also contact your provider using MyChart. We now offer e-Visits for anyone 64 and older to request care online for non-urgent symptoms. For details visit [email or web address].   Also download the MyChart app! Go to the app store, search MyChart, open the app, select [facility], and log in with your MyChart username and password.

## 2024-09-23 LAB — T4: T4, Total: 6.9 ug/dL (ref 4.5–12.0)

## 2024-09-25 ENCOUNTER — Other Ambulatory Visit: Payer: Self-pay

## 2024-09-26 ENCOUNTER — Other Ambulatory Visit: Payer: Self-pay

## 2024-09-27 ENCOUNTER — Other Ambulatory Visit: Payer: Self-pay

## 2024-09-28 ENCOUNTER — Other Ambulatory Visit: Payer: Self-pay | Admitting: Nurse Practitioner

## 2024-09-28 ENCOUNTER — Inpatient Hospital Stay

## 2024-09-28 ENCOUNTER — Other Ambulatory Visit: Payer: Self-pay

## 2024-09-28 DIAGNOSIS — D6481 Anemia due to antineoplastic chemotherapy: Secondary | ICD-10-CM

## 2024-09-28 DIAGNOSIS — C210 Malignant neoplasm of anus, unspecified: Secondary | ICD-10-CM

## 2024-09-28 DIAGNOSIS — Z5112 Encounter for antineoplastic immunotherapy: Secondary | ICD-10-CM | POA: Diagnosis not present

## 2024-09-28 LAB — CMP (CANCER CENTER ONLY)
ALT: 23 U/L (ref 0–44)
AST: 29 U/L (ref 15–41)
Albumin: 3.9 g/dL (ref 3.5–5.0)
Alkaline Phosphatase: 84 U/L (ref 38–126)
Anion gap: 7 (ref 5–15)
BUN: 20 mg/dL (ref 8–23)
CO2: 25 mmol/L (ref 22–32)
Calcium: 9.3 mg/dL (ref 8.9–10.3)
Chloride: 106 mmol/L (ref 98–111)
Creatinine: 0.6 mg/dL (ref 0.44–1.00)
GFR, Estimated: >60 mL/min
Glucose, Bld: 106 mg/dL — ABNORMAL HIGH (ref 70–99)
Potassium: 4.1 mmol/L (ref 3.5–5.1)
Sodium: 138 mmol/L (ref 135–145)
Total Bilirubin: 0.5 mg/dL (ref 0.0–1.2)
Total Protein: 6.4 g/dL — ABNORMAL LOW (ref 6.5–8.1)

## 2024-09-28 LAB — CBC WITH DIFFERENTIAL (CANCER CENTER ONLY)
Abs Immature Granulocytes: 0.01 K/uL (ref 0.00–0.07)
Basophils Absolute: 0 K/uL (ref 0.0–0.1)
Basophils Relative: 1 %
Eosinophils Absolute: 0.1 K/uL (ref 0.0–0.5)
Eosinophils Relative: 5 %
HCT: 25.8 % — ABNORMAL LOW (ref 36.0–46.0)
Hemoglobin: 8.8 g/dL — ABNORMAL LOW (ref 12.0–15.0)
Immature Granulocytes: 1 %
Lymphocytes Relative: 21 %
Lymphs Abs: 0.4 K/uL — ABNORMAL LOW (ref 0.7–4.0)
MCH: 29.7 pg (ref 26.0–34.0)
MCHC: 34.1 g/dL (ref 30.0–36.0)
MCV: 87.2 fL (ref 80.0–100.0)
Monocytes Absolute: 0.2 K/uL (ref 0.1–1.0)
Monocytes Relative: 10 %
Neutro Abs: 1.3 K/uL — ABNORMAL LOW (ref 1.7–7.7)
Neutrophils Relative %: 62 %
Platelet Count: 69 K/uL — ABNORMAL LOW (ref 150–400)
RBC: 2.96 MIL/uL — ABNORMAL LOW (ref 3.87–5.11)
RDW: 18.4 % — ABNORMAL HIGH (ref 11.5–15.5)
WBC Count: 2.1 K/uL — ABNORMAL LOW (ref 4.0–10.5)
nRBC: 0 % (ref 0.0–0.2)

## 2024-09-28 LAB — SAMPLE TO BLOOD BANK

## 2024-09-28 LAB — FERRITIN: Ferritin: 461 ng/mL — ABNORMAL HIGH (ref 11–307)

## 2024-09-28 NOTE — Progress Notes (Signed)
 Platelet count 69 today. Powell Lessen, NP notified. Order to [facility] treatment today. Patient notified and agreeable. Bleeding precautions reviewed with patient and family. Will follow-up as scheduled on 1/15.

## 2024-09-29 ENCOUNTER — Other Ambulatory Visit: Payer: Self-pay

## 2024-10-02 ENCOUNTER — Telehealth: Payer: Self-pay

## 2024-10-02 NOTE — Telephone Encounter (Signed)
 Auth Submission: NO AUTH NEEDED Site of care: Site of care: [facility] Payer: Medicare A/B with  BCBS supplement Medication & CPT/J Code(s) submitted: Entyvio  (Vedolizumab ) J3380 Diagnosis Code:  Route of submission (phone, fax, portal):  Phone # Fax # Auth type: Buy/Bill PB Units/visits requested: 300mg  x 6 doses Reference number:  Approval from: 10/02/24 to 10/21/25

## 2024-10-05 ENCOUNTER — Other Ambulatory Visit: Payer: Self-pay

## 2024-10-05 ENCOUNTER — Inpatient Hospital Stay: Admitting: Hematology

## 2024-10-05 ENCOUNTER — Inpatient Hospital Stay

## 2024-10-05 VITALS — BP 147/67 | HR 77 | Temp 97.6°F | Resp 15 | Ht 65.0 in | Wt 148.0 lb

## 2024-10-05 DIAGNOSIS — C210 Malignant neoplasm of anus, unspecified: Secondary | ICD-10-CM

## 2024-10-05 DIAGNOSIS — D6481 Anemia due to antineoplastic chemotherapy: Secondary | ICD-10-CM

## 2024-10-05 DIAGNOSIS — Z5112 Encounter for antineoplastic immunotherapy: Secondary | ICD-10-CM | POA: Diagnosis not present

## 2024-10-05 LAB — CBC WITH DIFFERENTIAL (CANCER CENTER ONLY)
Abs Immature Granulocytes: 0 K/uL (ref 0.00–0.07)
Basophils Absolute: 0 K/uL (ref 0.0–0.1)
Basophils Relative: 0 %
Eosinophils Absolute: 0.1 K/uL (ref 0.0–0.5)
Eosinophils Relative: 3 %
HCT: 24.3 % — ABNORMAL LOW (ref 36.0–46.0)
Hemoglobin: 8.4 g/dL — ABNORMAL LOW (ref 12.0–15.0)
Immature Granulocytes: 0 %
Lymphocytes Relative: 27 %
Lymphs Abs: 0.5 K/uL — ABNORMAL LOW (ref 0.7–4.0)
MCH: 30.2 pg (ref 26.0–34.0)
MCHC: 34.6 g/dL (ref 30.0–36.0)
MCV: 87.4 fL (ref 80.0–100.0)
Monocytes Absolute: 0.4 K/uL (ref 0.1–1.0)
Monocytes Relative: 23 %
Neutro Abs: 0.9 K/uL — ABNORMAL LOW (ref 1.7–7.7)
Neutrophils Relative %: 47 %
Platelet Count: 97 K/uL — ABNORMAL LOW (ref 150–400)
RBC: 2.78 MIL/uL — ABNORMAL LOW (ref 3.87–5.11)
RDW: 19.5 % — ABNORMAL HIGH (ref 11.5–15.5)
WBC Count: 1.9 K/uL — ABNORMAL LOW (ref 4.0–10.5)
nRBC: 0 % (ref 0.0–0.2)

## 2024-10-05 LAB — CMP (CANCER CENTER ONLY)
ALT: 20 U/L (ref 0–44)
AST: 25 U/L (ref 15–41)
Albumin: 4.1 g/dL (ref 3.5–5.0)
Alkaline Phosphatase: 72 U/L (ref 38–126)
Anion gap: 12 (ref 5–15)
BUN: 22 mg/dL (ref 8–23)
CO2: 22 mmol/L (ref 22–32)
Calcium: 9.3 mg/dL (ref 8.9–10.3)
Chloride: 106 mmol/L (ref 98–111)
Creatinine: 0.7 mg/dL (ref 0.44–1.00)
GFR, Estimated: >60 mL/min
Glucose, Bld: 95 mg/dL (ref 70–99)
Potassium: 4 mmol/L (ref 3.5–5.1)
Sodium: 140 mmol/L (ref 135–145)
Total Bilirubin: 0.5 mg/dL (ref 0.0–1.2)
Total Protein: 6.6 g/dL (ref 6.5–8.1)

## 2024-10-05 LAB — SAMPLE TO BLOOD BANK

## 2024-10-05 MED ORDER — SODIUM CHLORIDE 0.9 % IV SOLN: INTRAVENOUS | Status: DC

## 2024-10-05 MED ORDER — FAMOTIDINE IN NACL 20-0.9 MG/50ML-% IV SOLN
20.0000 mg | Freq: Once | INTRAVENOUS | Status: AC
  Administered 2024-10-05: 20 mg via INTRAVENOUS
  Filled 2024-10-05: qty 50

## 2024-10-05 MED ORDER — DIPHENHYDRAMINE HCL 50 MG/ML IJ SOLN
25.0000 mg | Freq: Once | INTRAMUSCULAR | Status: AC
  Administered 2024-10-05: 25 mg via INTRAVENOUS
  Filled 2024-10-05: qty 1

## 2024-10-05 MED ORDER — SODIUM CHLORIDE 0.9 % IV SOLN
80.0000 mg/m2 | Freq: Once | INTRAVENOUS | Status: AC
  Administered 2024-10-05: 150 mg via INTRAVENOUS
  Filled 2024-10-05: qty 25

## 2024-10-05 NOTE — Progress Notes (Signed)
 " [facility]   [phone] Fax:([phone]) [phone]   Clinic Follow up Note   Patient Care Team: Marvene Prentice SAUNDERS, FNP as PCP - General (Family Medicine) Shlomo Wilbert SAUNDERS, MD as PCP - Cardiology (Cardiology) Ardis Evalene CROME, RN as Oncology Nurse Navigator Lanny Callander, MD as Consulting Physician (Hematology and Oncology) Legrand Victory CROME DOUGLAS, MD as Consulting Physician (Gastroenterology)  Date of Service:  10/05/2024  CHIEF COMPLAINT: f/u of metastatic anal squamous cell carcinoma  CURRENT THERAPY:  carbo taxol  and immunotherapy zynyz   Oncology History   Anal squamous cell carcinoma (HCC) cT2N1M0, stage IIIA -previous history of superficially invasive squamous cell carcinoma of anus, status post surgical resection in August 2023, presented with rectal discomfort for several months, and a palpable 2 to 3 cm mass in the anal canal, biopsy confirmed invasive squamous cell carcinoma in April 2025.  -PET Jan 25, 2024 showed long segment anorectal hypermetabolic activity, and hypermetabolic right inguinal and external iliac lymph nodes metastasis.  No distant metastasis. -she started concurrent chemoradiation with 5-FU and mitomycin  on Jan 31, 2024. -she was hospitalized for pericarditis on 02/02/2024, clinically suspicious for 5-fu induced coronary artery spasm. Chemo held on day 4 -She was admitted to hospital for cycle 2 chemotherapy with cardiology on board, she still developed a significant recurrent chest pain and 5-FU infusion was stopped on day 4 -RT completed on 03/16/24 -PET 06/12/2024 showed resolved hypermetabolic activity in the anus and right inguinal and pelvic lymph nodes, however she has mildly progressive hypermetabolic retroperitoneal and new left [place] adenopathy. Biopsy of the left [place] node confirmed metastatic anal cancer.  -I recommend chemo carbo taxol  and immunotherapy zynyz , she started on 07/27/2024  Assessment & Plan Stage IV metastatic anal squamous cell  carcinoma -She is receiving combination chemotherapy (Taxol , carboplatin ) and immunotherapy. ctDNA (Signatera) has decreased from detectable to undetectable, indicating a favorable molecular response, though not confirming complete remission. Treatment is being modified to balance efficacy and tolerability due to delayed hematologic recovery. Prior chemoradiation courses were incomplete; current management is tailored to metastatic disease and her ability to tolerate therapy. - Administered scheduled Taxol  chemotherapy today. - Scheduled PET scan for next week to assess disease response, especially in previously involved sites. - Planned continuation of current chemotherapy and immunotherapy regimen, with possible dose reduction or transition to every-other-week dosing if cytopenias persist. - Discussed transition to immunotherapy alone after six cycles of chemotherapy, pending response and tolerability. - Discussed that dose reduction or omission is acceptable in metastatic setting to allow hematologic recovery, prioritizing disease control and survival. - Scheduled follow-up in two weeks to review PET scan results and reassess treatment plan.  Chemotherapy-induced neutropenia Persistent neutropenia (ANC 0.9) secondary to myelosuppressive chemotherapy, particularly carboplatin , resulting in increased infection risk and delayed neutrophil recovery, necessitating treatment modifications. - Held chemotherapy last week due to neutropenia. - Growth factor injection scheduled for Saturday to promote neutrophil recovery. - Monitored neutrophil counts and adjusted chemotherapy schedule accordingly. - Discussed possible future dose reduction or alteration of chemotherapy schedule to minimize recurrent neutropenia.  Chemotherapy-induced thrombocytopenia Thrombocytopenia following chemotherapy, with platelet count improved to 97, permitting resumption of chemotherapy. Platelet recovery remains incomplete but  adequate for treatment. - Monitored platelet counts and held chemotherapy last week due to thrombocytopenia. - Resumed chemotherapy this week as platelet count improved. - Continued to monitor platelet counts and adjust chemotherapy as indicated.  Chemotherapy-induced anemia Mild to moderate anemia (hemoglobin 8.4) secondary to chemotherapy. Iron levels previously normal. Blood transfusion provided  13 days prior to last ctDNA test. - Monitored hemoglobin and iron levels.  Plan - Day 8 chemotherapy with held last week due to her pancytopenia.  Her thrombocytopenia has improved today, neutropenia slightly worse.  Will proceed day 15 paclitaxel  today with G-CSF in 2 days - She will proceed with scheduled restaging PET scan next week - Follow-up in 2 weeks.  If she has had good response on PET scan, plan to change her chemotherapy to day 1 and day 15 every 28 days    SUMMARY OF ONCOLOGIC HISTORY: Oncology History  Anal squamous cell carcinoma (HCC)  12/29/2023 Pathology Results   A. PERIANAL, RIGHT POSTERIOR, BIOPSY:       Invasive squamous cell carcinoma, moderately differentiated. Suspicious for lymphovascular invasion.  B. PERIANAL, LEFT POSTERIOR, BIOPSY:       High-grade squamous intraepithelial lesion (HSIL / AIN 3).  No evidence of definitive invasion.    01/20/2024 Initial Diagnosis   Anal cancer (HCC)   01/26/2024 Cancer Staging   Staging form: Anus, AJCC 8th Edition - Clinical: Stage IIIA (cT2, cN1c, cM0) - Signed by Lanny Callander, MD on 01/26/2024   01/31/2024 - 02/28/2024 Chemotherapy   Patient is on Treatment Plan : ANUS Mitomycin  D1,28 + 5FU D1-4, 28-31 q32d     07/27/2024 -  Chemotherapy   Patient is on Treatment Plan : ANUS Retifanlimab  + Paclitaxel  (80) + Carboplatin  (5) / Retifanlimab         Discussed the use of AI scribe software for clinical note transcription with the patient, who gave verbal consent to proceed.  History of Present Illness Andrea [name] is a 69 year old  female with recurrent metastatic anal squamous cell carcinoma undergoing chemotherapy and immunotherapy who presents for follow-up of treatment and management of chemotherapy-induced cytopenias.  She is in month 3 of combination Taxol /carboplatin  and immunotherapy for metastatic disease to the clavicle and retroperitoneum. Recent treatment was held for severe cytopenias with WBC <100, ANC 0.69, and thrombocytopenia. Today her ANC is 0.9, platelets 97, and hemoglobin 8.4 with previously normal iron studies. She is scheduled to receive growth factor today.  Recent ctDNA (Signatera/Oncodetect) decreased from 6.18 in October to undetectable. She and her caregiver asked about the effect of a blood transfusion received 13 days before that test. She has a PET scan scheduled next week to reassess disease, focusing on prior metastatic sites.  She has no new symptoms and no abnormal bleeding or bruising.     All other systems were reviewed with the patient and are negative.  MEDICAL HISTORY:  Past Medical History:  Diagnosis Date   Anemia    Anginal pain    Arthritis    Chest discomfort    Disorder of immune system    Dyspnea    Dyspnea on exertion    Hypercholesterolemia    Hypertension    Migraine    Non-ulcer dyspepsia    Osteopenia of spine    Peptic ulcer disease    Pneumonia    Polyp of rectum    cancerous   Pre-diabetes    Skin cancer    Ulcerative colitis (HCC)     SURGICAL HISTORY: Past Surgical History:  Procedure Laterality Date   ANKLE FRACTURE SURGERY Left    CARDIAC CATHETERIZATION     COLONOSCOPY     ESOPHAGOGASTRODUODENOSCOPY     hip replacement Bilateral    IR IMAGING GUIDED PORT INSERTION  07/24/2024   LEFT HEART CATH AND CORONARY ANGIOGRAPHY N/A 01/21/2022   Procedure: LEFT HEART  CATH AND CORONARY ANGIOGRAPHY;  Surgeon: Verlin Lonni BIRCH, MD;  Location: [facility];  Service: Cardiovascular;  Laterality: N/A;   LEFT HEART CATH AND CORONARY  ANGIOGRAPHY N/A 02/03/2024   Procedure: LEFT HEART CATH AND CORONARY ANGIOGRAPHY;  Surgeon: Darron Deatrice LABOR, MD;  Location: [facility];  Service: Cardiovascular;  Laterality: N/A;   RECTAL BIOPSY N/A 05/04/2022   Procedure: BIOPSY RECTAL VS EXCISION OF PERIANAL LESION UNDER ANOSCOPY;  Surgeon: Teresa Lonni HERO, MD;  Location: [facility];  Service: General;  Laterality: N/A;   TOTAL KNEE ARTHROPLASTY Left 10/19/2022   Procedure: LEFT TOTAL KNEE ARTHROPLASTY;  Surgeon: Jerri Kay HERO, MD;  Location: [facility];  Service: Orthopedics;  Laterality: Left;   TUBAL LIGATION     TUMOR EXCISION N/A 01/14/2024   Procedure: TRANSRECTAL TUMOR EXCISION;  Surgeon: Teresa Lonni HERO, MD;  Location: [facility];  Service: General;  Laterality: N/A;  Transanal excision of distal rectal and anal lesion (wide local)    I have reviewed the social history and family history with the patient and they are unchanged from previous note.  ALLERGIES:  is allergic to codeine.  MEDICATIONS:  Current Outpatient Medications  Medication Sig Dispense Refill   acetaminophen  (TYLENOL ) 500 MG tablet Take 1,000 mg by mouth every 6 (six) hours as needed for moderate pain.     albuterol  (VENTOLIN  HFA) 108 (90 Base) MCG/ACT inhaler Inhale 1-2 puffs into the lungs every 4 (four) hours as needed for wheezing or shortness of breath. 8 g 0   ALPRAZolam  (XANAX ) 0.25 MG tablet Take 1 tablet (0.25 mg total) by mouth 2 (two) times daily as needed for anxiety. 30 tablet 0   amLODipine  (NORVASC ) 5 MG tablet Take 1 tablet (5 mg total) by mouth daily. 90 tablet 3   ammonium lactate (LAC-HYDRIN) 12 % lotion Apply 1 Application topically daily.     atorvastatin  (LIPITOR) 80 MG tablet TAKE ONE TABLET BY MOUTH DAILY 90 tablet 1   ezetimibe  (ZETIA ) 10 MG tablet TAKE 1 TABLET BY MOUTH DAILY 90 tablet 3   famotidine  (PEPCID ) 20 MG tablet Take 1 tablet (20 mg total) by mouth 2 (two) times daily as needed for heartburn or  indigestion. 60 tablet 1   fluticasone  (FLONASE ) 50 MCG/ACT nasal spray Place 2 sprays into both nostrils as needed for allergies or rhinitis.     hydrocortisone  (ANUSOL -HC) 25 MG suppository Place 1 suppository (25 mg total) rectally 2 (two) times daily. 12 suppository 2   ivermectin (STROMECTOL) 3 MG TABS tablet Take 12 mg by mouth once.     lidocaine -prilocaine  (EMLA ) cream Apply to affected area once 30 g 3   nitroGLYCERIN  (NITROSTAT ) 0.4 MG SL tablet Place 1 tablet (0.4 mg total) under the tongue every 5 (five) minutes as needed for chest pain. 20 tablet 12   ondansetron  (ZOFRAN ) 8 MG tablet Take 1 tablet (8 mg total) by mouth every 8 (eight) hours as needed for nausea or vomiting. Start on the third day after carboplatin . 30 tablet 1   oxyCODONE  (OXY IR/ROXICODONE ) 5 MG immediate release tablet Take 0.5-1 tablets (2.5-5 mg total) by mouth every 4 (four) hours as needed for severe pain (pain score 7-10). 60 tablet 0   PARoxetine  (PAXIL ) 20 MG tablet Take 10 mg by mouth daily.     prochlorperazine  (COMPAZINE ) 10 MG tablet Take 1 tablet (10 mg total) by mouth every 6 (six) hours as needed for nausea or vomiting. 30 tablet 1  silver  sulfADIAZINE  (SILVADENE ) 1 % cream Apply 1 Application topically 3 (three) times daily as needed. Apply to buttocks area as needed 50 g 1   SUMAtriptan (IMITREX) 25 MG tablet Take 25 mg by mouth every 2 (two) hours as needed for migraine.     vedolizumab  (ENTYVIO ) 300 MG injection Inject 300 mg into the vein every 8 (eight) weeks.     vedolizumab  (ENTYVIO ) 300 MG injection Inject 300 mg into the vein as directed.     No current facility-administered medications for this visit.   Facility-Administered Medications Ordered in Other Visits  Medication Dose Route Frequency Provider Last Rate Last Admin   0.9 %  sodium chloride  infusion   Intravenous Continuous Lanny Callander, MD   Stopped at 10/05/24 1230    PHYSICAL EXAMINATION: ECOG PERFORMANCE STATUS: 1 - Symptomatic  but completely ambulatory  Vitals:   10/05/24 0900 10/05/24 0925  BP: (!) 148/66 (!) 147/67  Pulse: 88 77  Resp: 15   Temp: 97.6 F (36.4 C)   SpO2: 100% 100%   Wt Readings from Last 3 Encounters:  10/05/24 148 lb (67.1 kg)  09/28/24 146 lb 6.4 oz (66.4 kg)  09/22/24 146 lb (66.2 kg)     GENERAL:alert, no distress and comfortable SKIN: skin color, texture, turgor are normal, no rashes or significant lesions EYES: normal, Conjunctiva are pink and non-injected, sclera clear NECK: supple, thyroid  normal size, non-tender, without nodularity LYMPH:  no palpable lymphadenopathy in the cervical, axillary  LUNGS: clear to auscultation and percussion with normal breathing effort HEART: regular rate & rhythm and no murmurs and no lower extremity edema ABDOMEN:abdomen soft, non-tender and normal bowel sounds Musculoskeletal:no cyanosis of digits and no clubbing  NEURO: alert & oriented x 3 with fluent speech, no focal motor/sensory deficits  Physical Exam    LABORATORY DATA:  I have reviewed the data as listed    Latest Ref Rng & Units 10/05/2024    9:01 AM 09/28/2024    8:19 AM 09/22/2024    9:33 AM  CBC  WBC 4.0 - 10.5 K/uL 1.9  2.1  4.8   Hemoglobin 12.0 - 15.0 g/dL 8.4  8.8  89.7   Hematocrit 36.0 - 46.0 % 24.3  25.8  30.0   Platelets 150 - 400 K/uL 97  69  100         Latest Ref Rng & Units 10/05/2024    9:01 AM 09/28/2024    8:19 AM 09/22/2024    9:33 AM  CMP  Glucose 70 - 99 mg/dL 95  893  894   BUN 8 - 23 mg/dL 22  20  18    Creatinine 0.44 - 1.00 mg/dL 9.29  9.39  9.29   Sodium 135 - 145 mmol/L 140  138  139   Potassium 3.5 - 5.1 mmol/L 4.0  4.1  3.8   Chloride 98 - 111 mmol/L 106  106  107   CO2 22 - 32 mmol/L 22  25  23    Calcium  8.9 - 10.3 mg/dL 9.3  9.3  9.5   Total Protein 6.5 - 8.1 g/dL 6.6  6.4  6.8   Total Bilirubin 0.0 - 1.2 mg/dL 0.5  0.5  0.5   Alkaline Phos 38 - 126 U/L 72  84  85   AST 15 - 41 U/L 25  29  25    ALT 0 - 44 U/L 20  23  20         RADIOGRAPHIC STUDIES: I have personally  reviewed the radiological images as listed and agreed with the findings in the report. No results found.    No orders of the defined types were placed in this encounter.  All questions were answered. The patient knows to call the clinic with any problems, questions or concerns. No barriers to learning was detected. The total time spent in the appointment was 30 minutes, including review of chart and various tests results, discussions about plan of care and coordination of care plan     Onita Mattock, MD 10/05/2024     "

## 2024-10-05 NOTE — Assessment & Plan Note (Signed)
 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  -PET Jan 25, 2024 showed long segment anorectal hypermetabolic activity, and hypermetabolic right inguinal and external iliac lymph nodes metastasis.  No distant metastasis. -she started concurrent chemoradiation with 5-FU and mitomycin  on Jan 31, 2024. -she was hospitalized for pericarditis on 02/02/2024, clinically suspicious for 5-fu induced coronary artery spasm. Chemo held on day 4 -She was admitted to hospital for cycle 2 chemotherapy with cardiology on board, she still developed a significant recurrent chest pain and 5-FU infusion was stopped on day 4 -RT completed on 03/16/24 -PET 06/12/2024 showed resolved hypermetabolic activity in the anus and right inguinal and pelvic lymph nodes, however she has mildly progressive hypermetabolic retroperitoneal and new left [place] adenopathy. Biopsy of the left [place] node confirmed metastatic anal cancer.  -I recommend chemo carbo taxol and immunotherapy zynyz, she started on 07/27/2024

## 2024-10-05 NOTE — Progress Notes (Signed)
 As per Dr. Lanny, contacted patient to make her aware her [employer] came back negative. Patient had no further questions or concerns.

## 2024-10-05 NOTE — Patient Instructions (Signed)

## 2024-10-06 ENCOUNTER — Encounter: Payer: Self-pay | Admitting: Gastroenterology

## 2024-10-06 ENCOUNTER — Encounter: Payer: Self-pay | Admitting: Hematology

## 2024-10-06 ENCOUNTER — Encounter: Payer: Self-pay | Admitting: Nurse Practitioner

## 2024-10-07 ENCOUNTER — Inpatient Hospital Stay

## 2024-10-07 VITALS — BP 156/64 | HR 103 | Temp 97.3°F | Resp 20

## 2024-10-07 DIAGNOSIS — C210 Malignant neoplasm of anus, unspecified: Secondary | ICD-10-CM

## 2024-10-07 DIAGNOSIS — Z5112 Encounter for antineoplastic immunotherapy: Secondary | ICD-10-CM | POA: Diagnosis not present

## 2024-10-07 MED ORDER — PEGFILGRASTIM-CBQV 6 MG/0.6ML ~~LOC~~ SOSY
6.0000 mg | PREFILLED_SYRINGE | Freq: Once | SUBCUTANEOUS | Status: AC
  Administered 2024-10-07: 6 mg via SUBCUTANEOUS

## 2024-10-10 ENCOUNTER — Encounter: Payer: Self-pay | Admitting: Hematology

## 2024-10-12 ENCOUNTER — Other Ambulatory Visit: Payer: Self-pay

## 2024-10-12 ENCOUNTER — Encounter (HOSPITAL_COMMUNITY)
Admission: RE | Admit: 2024-10-12 | Discharge: 2024-10-12 | Disposition: A | Discharge status: Home / Self Care | Source: Ambulatory Visit | Attending: Hematology | Admitting: Hematology

## 2024-10-12 DIAGNOSIS — C210 Malignant neoplasm of anus, unspecified: Secondary | ICD-10-CM | POA: Diagnosis present

## 2024-10-12 LAB — GLUCOSE, CAPILLARY: Glucose-Capillary: 92 mg/dL (ref 70–99)

## 2024-10-12 MED ORDER — FLUDEOXYGLUCOSE F - 18 (FDG) INJECTION
7.3000 | Freq: Once | INTRAVENOUS | Status: AC | PRN
  Administered 2024-10-12: 7 via INTRAVENOUS

## 2024-10-18 ENCOUNTER — Other Ambulatory Visit: Payer: Self-pay

## 2024-10-19 ENCOUNTER — Inpatient Hospital Stay

## 2024-10-19 ENCOUNTER — Inpatient Hospital Stay: Admitting: Hematology

## 2024-10-19 ENCOUNTER — Ambulatory Visit: Admitting: Cardiology

## 2024-10-19 ENCOUNTER — Other Ambulatory Visit: Payer: Self-pay

## 2024-10-19 VITALS — BP 130/63 | HR 86 | Temp 97.0°F | Resp 16 | Wt 144.7 lb

## 2024-10-19 DIAGNOSIS — Z5112 Encounter for antineoplastic immunotherapy: Secondary | ICD-10-CM | POA: Diagnosis not present

## 2024-10-19 DIAGNOSIS — C210 Malignant neoplasm of anus, unspecified: Secondary | ICD-10-CM

## 2024-10-19 DIAGNOSIS — T451X5A Adverse effect of antineoplastic and immunosuppressive drugs, initial encounter: Secondary | ICD-10-CM

## 2024-10-19 LAB — CMP (CANCER CENTER ONLY)
ALT: 17 U/L (ref 0–44)
AST: 22 U/L (ref 15–41)
Albumin: 4.1 g/dL (ref 3.5–5.0)
Alkaline Phosphatase: 91 U/L (ref 38–126)
Anion gap: 11 (ref 5–15)
BUN: 23 mg/dL (ref 8–23)
CO2: 22 mmol/L (ref 22–32)
Calcium: 9.3 mg/dL (ref 8.9–10.3)
Chloride: 107 mmol/L (ref 98–111)
Creatinine: 0.71 mg/dL (ref 0.44–1.00)
GFR, Estimated: >60 mL/min
Glucose, Bld: 103 mg/dL — ABNORMAL HIGH (ref 70–99)
Potassium: 4 mmol/L (ref 3.5–5.1)
Sodium: 139 mmol/L (ref 135–145)
Total Bilirubin: 0.4 mg/dL (ref 0.0–1.2)
Total Protein: 6.7 g/dL (ref 6.5–8.1)

## 2024-10-19 LAB — CBC WITH DIFFERENTIAL (CANCER CENTER ONLY)
Abs Immature Granulocytes: 0.02 10*3/uL (ref 0.00–0.07)
Basophils Absolute: 0 10*3/uL (ref 0.0–0.1)
Basophils Relative: 1 %
Eosinophils Absolute: 0.1 10*3/uL (ref 0.0–0.5)
Eosinophils Relative: 1 %
HCT: 25.7 % — ABNORMAL LOW (ref 36.0–46.0)
Hemoglobin: 8.7 g/dL — ABNORMAL LOW (ref 12.0–15.0)
Immature Granulocytes: 0 %
Lymphocytes Relative: 15 %
Lymphs Abs: 0.7 10*3/uL (ref 0.7–4.0)
MCH: 31 pg (ref 26.0–34.0)
MCHC: 33.9 g/dL (ref 30.0–36.0)
MCV: 91.5 fL (ref 80.0–100.0)
Monocytes Absolute: 0.5 10*3/uL (ref 0.1–1.0)
Monocytes Relative: 10 %
Neutro Abs: 3.5 10*3/uL (ref 1.7–7.7)
Neutrophils Relative %: 73 %
Platelet Count: 92 10*3/uL — ABNORMAL LOW (ref 150–400)
RBC: 2.81 MIL/uL — ABNORMAL LOW (ref 3.87–5.11)
RDW: 22.3 % — ABNORMAL HIGH (ref 11.5–15.5)
WBC Count: 4.8 10*3/uL (ref 4.0–10.5)
nRBC: 0 % (ref 0.0–0.2)

## 2024-10-19 LAB — FERRITIN: Ferritin: 280 ng/mL (ref 11–307)

## 2024-10-19 LAB — MISCELLANEOUS TEST

## 2024-10-19 LAB — SAMPLE TO BLOOD BANK

## 2024-10-19 MED ORDER — FAMOTIDINE IN NACL 20-0.9 MG/50ML-% IV SOLN
20.0000 mg | Freq: Once | INTRAVENOUS | Status: AC
  Administered 2024-10-19: 20 mg via INTRAVENOUS
  Filled 2024-10-19: qty 50

## 2024-10-19 MED ORDER — SODIUM CHLORIDE 0.9 % IV SOLN
80.0000 mg/m2 | Freq: Once | INTRAVENOUS | Status: AC
  Administered 2024-10-19: 150 mg via INTRAVENOUS
  Filled 2024-10-19: qty 25

## 2024-10-19 MED ORDER — APREPITANT 130 MG/18ML IV EMUL
130.0000 mg | Freq: Once | INTRAVENOUS | Status: AC
  Administered 2024-10-19: 130 mg via INTRAVENOUS
  Filled 2024-10-19: qty 18

## 2024-10-19 MED ORDER — SODIUM CHLORIDE 0.9 % IV SOLN
500.0000 mg | Freq: Once | INTRAVENOUS | Status: AC
  Administered 2024-10-19: 500 mg via INTRAVENOUS
  Filled 2024-10-19: qty 20

## 2024-10-19 MED ORDER — DIPHENHYDRAMINE HCL 50 MG/ML IJ SOLN
25.0000 mg | Freq: Once | INTRAMUSCULAR | Status: AC
  Administered 2024-10-19: 25 mg via INTRAVENOUS
  Filled 2024-10-19: qty 1

## 2024-10-19 MED ORDER — SODIUM CHLORIDE 0.9 % IV SOLN
344.0000 mg | Freq: Once | INTRAVENOUS | Status: AC
  Administered 2024-10-19: 340 mg via INTRAVENOUS
  Filled 2024-10-19: qty 34

## 2024-10-19 MED ORDER — PALONOSETRON HCL INJECTION 0.25 MG/5ML
0.2500 mg | Freq: Once | INTRAVENOUS | Status: AC
  Administered 2024-10-19: 0.25 mg via INTRAVENOUS
  Filled 2024-10-19: qty 5

## 2024-10-19 MED ORDER — SODIUM CHLORIDE 0.9 % IV SOLN: INTRAVENOUS | Status: DC

## 2024-10-19 NOTE — Progress Notes (Signed)
 " [name]   [phone] [phone]   Clinic Follow up Note   Patient Care Team: Marvene Prentice SAUNDERS, FNP as PCP - General (Family Medicine) Shlomo Wilbert SAUNDERS, MD as PCP - Cardiology (Cardiology) Ardis Evalene CROME, RN as Oncology Nurse Navigator Lanny Callander, MD as Consulting Physician (Hematology and Oncology) Legrand Victory CROME DOUGLAS, MD as Consulting Physician (Gastroenterology)  Date of Service:  10/19/2024  CHIEF COMPLAINT: f/u of anal cancer  CURRENT THERAPY:  Chemotherapy carboplatin , paclitaxel  and Zynyz   Oncology History   Anal squamous cell carcinoma (HCC) cT2N1M0, stage IIIA -previous history of superficially invasive squamous cell carcinoma of anus, status post surgical resection in August 2023, presented with rectal discomfort for several months, and a palpable 2 to 3 cm mass in the anal canal, biopsy confirmed invasive squamous cell carcinoma in April 2025.  -PET Jan 25, 2024 showed long segment anorectal hypermetabolic activity, and hypermetabolic right inguinal and external iliac lymph nodes metastasis.  No distant metastasis. -she started concurrent chemoradiation with 5-FU and mitomycin  on Jan 31, 2024. -she was hospitalized for pericarditis on 02/02/2024, clinically suspicious for 5-fu induced coronary artery spasm. Chemo held on day 4 -She was admitted to hospital for cycle 2 chemotherapy with cardiology on board, she still developed a significant recurrent chest pain and 5-FU infusion was stopped on day 4 -RT completed on 03/16/24 -PET 06/12/2024 showed resolved hypermetabolic activity in the anus and right inguinal and pelvic lymph nodes, however she has mildly progressive hypermetabolic retroperitoneal and new left [name] adenopathy. Biopsy of the left [place] node confirmed metastatic anal cancer.  -I recommend chemo carbo taxol  and immunotherapy zynyz , she started on 07/27/2024 -PET 10/12/2024 showed complete response, will continue treatment    Assessment & Plan Metastatic anal squamous cell carcinoma Recurrent, metastatic anal squamous cell carcinoma with prior biopsy-proven lymph node involvement. She has achieved a complete radiographic response in previously involved lymph nodes, indicating an excellent response. No evidence of new metastatic disease, but risk of microscopic residual disease persists. Prognosis remains guarded but is improved with current response. Chemotherapy is unlikely to be curative, but ongoing therapy is warranted due to recurrence risk. She is tolerating therapy with fatigue and cytopenias, and will transition to immunotherapy alone after 6 cycles if response and tolerability are adequate. - Continued current chemotherapy and immunotherapy regimen, with plan to complete 6 cycles of chemotherapy. - Changed chemotherapy schedule to every other week to allow hematologic recovery and reduce fatigue. - Plan to discontinue chemotherapy after 6 cycles and continue immunotherapy (Zynyz , PD-1 inhibitor) as monotherapy every month for up to 2 years, contingent on absence of disease progression and tolerability. - Provided education regarding limitations of imaging in detecting microscopic disease and possibility of remission if sustained response is maintained for 2 years. - Continued follow-up with Dr. Teresa (surgical oncology/colorectal surgery).  Chemotherapy-induced cytopenias She has experienced neutropenia, thrombocytopenia, and anemia secondary to chemotherapy, with current hemoglobin 8.7 g/dL and platelets 07,999/O. Cytopenias have contributed to fatigue and dyspnea. Chemotherapy has been held or dose-reduced as needed. Ongoing risk of cytopenias with continued therapy. - Monitored CBC and organ function at each visit. - Reduced carboplatin  dose and administered chemotherapy every other week to allow hematologic recovery.  Rectal bleeding She reports recent episodes of rectal bleeding with passage of blood  clots, including one large clot and ongoing smaller clots daily, without associated rectal pain. Ongoing risk of bleeding due to local disease and cytopenias. - Performed rectal examination to assess for  local causes of bleeding. -f/u colorectal surgeon (Dr. Teresa) for endoscopic evaluation of rectum and anus to assess for fistula, hemorrhoids, or other sources of bleeding.  Fatigue and shortness of breath due to chemotherapy She reports progressive fatigue and exertional dyspnea, with chest tightness and shortness of breath on minimal exertion. Symptoms have worsened with ongoing chemotherapy and are most likely related to chemotherapy-induced anemia and overall treatment fatigue. Cardiac etiology is also being considered given her pericarditis and ongoing chest symptoms. PET scan shows no evidence of pulmonary disease or pneumonitis. - Referred to cardiology (Dr. Shlomo) and cardio-oncology (Dr. Zenaida) for evaluation of possible cardiac contribution to symptoms. - Reduced chemotherapy frequency and carboplatin  dose to mitigate fatigue and dyspnea. - Monitored symptoms and adjusted treatment schedule as needed based on tolerance and symptom burden.  Plan - I personally reviewed her restaging PET scan, which showed complete response to treatment. - Lab reviewed, adequate for treatment, will proceed chemo today.  Due to her fatigue and cytopenias, I will reduce carboplatin  to AUC 4, and cancel paclitaxel  on day 8.  She will be on chemo every other week. - Will message her cardiologist to see if they can see her sooner for her dyspnea, rule out a cardiac cause.  She did not desat when she walked in my office for 5 to 6 minutes. - Follow-up in 2 weeks before next tx. if her ANC normal next time, we will cancel her injection on day 17.   SUMMARY OF ONCOLOGIC HISTORY: Oncology History  Anal squamous cell carcinoma (HCC)  12/29/2023 Pathology Results   A. PERIANAL, RIGHT POSTERIOR, BIOPSY:        Invasive squamous cell carcinoma, moderately differentiated. Suspicious for lymphovascular invasion.  B. PERIANAL, LEFT POSTERIOR, BIOPSY:       High-grade squamous intraepithelial lesion (HSIL / AIN 3).  No evidence of definitive invasion.    01/20/2024 Initial Diagnosis   Anal cancer (HCC)   01/26/2024 Cancer Staging   Staging form: Anus, AJCC 8th Edition - Clinical: Stage IIIA (cT2, cN1c, cM0) - Signed by Lanny Callander, MD on 01/26/2024   01/31/2024 - 02/28/2024 Chemotherapy   Patient is on Treatment Plan : ANUS Mitomycin  D1,28 + 5FU D1-4, 28-31 q32d     07/27/2024 -  Chemotherapy   Patient is on Treatment Plan : ANUS Retifanlimab  + Paclitaxel  (80) + Carboplatin  (5) / Retifanlimab         Discussed the use of AI scribe software for clinical note transcription with the patient, who gave verbal consent to proceed.  History of Present Illness Andrea [name] is a 69 year old female with metastatic anal squamous cell carcinoma who presents for follow-up of disease status and evaluation of new rectal bleeding and progressive exertional dyspnea.  She is on Taxol , carboplatin , and Zynyz  for metastatic anal squamous cell carcinoma, with last cycle 2 weeks ago. Appetite and weight are stable.  She has progressive exertional dyspnea and chest tightness with minimal activity such as dressing or walking in clinic, relieved by rest. Family notes slower walking. She denies lower extremity edema and has not checked home oxygen saturation. Fatigue has worsened with chemotherapy.  Four days ago she passed a large rectal blood clot without a bowel movement, followed by daily smaller clots. Rectal bleeding continues without rectal pain. She has not had a normal bowel movement since and is scheduled for endoscopic evaluation next week.  She is followed by cardiology for prior cardiitis and chest tightness. She describes the current chest symptoms as different  from prior episodes and progressing during chemotherapy.  Further cardiac evaluation is pending.  Oct 05, 2024: Follow-up for metastatic anal squamous cell carcinoma on combination chemotherapy (Taxol , carboplatin ) and immunotherapy (Zynyz ); treatment previously held due to severe neutropenia, thrombocytopenia, and anemia. ctDNA now undetectable, indicating favorable response. Growth factor injection given for neutrophil recovery; chemotherapy resumed as platelet count improved. PET scan scheduled to assess response; plan for possible dose adjustment or transition to immunotherapy alone after six cycles, depending on response and tolerability.     All other systems were reviewed with the patient and are negative.  MEDICAL HISTORY:  Past Medical History:  Diagnosis Date   Anemia    Anginal pain    Arthritis    Chest discomfort    Disorder of immune system    Dyspnea    Dyspnea on exertion    Hypercholesterolemia    Hypertension    Migraine    Non-ulcer dyspepsia    Osteopenia of spine    Peptic ulcer disease    Pneumonia    Polyp of rectum    cancerous   Pre-diabetes    Skin cancer    Ulcerative colitis (HCC)     SURGICAL HISTORY: Past Surgical History:  Procedure Laterality Date   ANKLE FRACTURE SURGERY Left    CARDIAC CATHETERIZATION     COLONOSCOPY     ESOPHAGOGASTRODUODENOSCOPY     hip replacement Bilateral    IR IMAGING GUIDED PORT INSERTION  07/24/2024   LEFT HEART CATH AND CORONARY ANGIOGRAPHY N/A 01/21/2022   Procedure: LEFT HEART CATH AND CORONARY ANGIOGRAPHY;  Surgeon: Verlin Lonni BIRCH, MD;  Location: [facility];  Service: Cardiovascular;  Laterality: N/A;   LEFT HEART CATH AND CORONARY ANGIOGRAPHY N/A 02/03/2024   Procedure: LEFT HEART CATH AND CORONARY ANGIOGRAPHY;  Surgeon: Darron Deatrice LABOR, MD;  Location: [facility];  Service: Cardiovascular;  Laterality: N/A;   RECTAL BIOPSY N/A 05/04/2022   Procedure: BIOPSY RECTAL VS EXCISION OF PERIANAL LESION UNDER ANOSCOPY;  Surgeon: Teresa Lonni HERO, MD;  Location: [facility];  Service: General;  Laterality: N/A;   TOTAL KNEE ARTHROPLASTY Left 10/19/2022   Procedure: LEFT TOTAL KNEE ARTHROPLASTY;  Surgeon: Jerri Kay HERO, MD;  Location: [facility];  Service: Orthopedics;  Laterality: Left;   TUBAL LIGATION     TUMOR EXCISION N/A 01/14/2024   Procedure: TRANSRECTAL TUMOR EXCISION;  Surgeon: Teresa Lonni HERO, MD;  Location: [facility];  Service: General;  Laterality: N/A;  Transanal excision of distal rectal and anal lesion (wide local)    I have reviewed the social history and family history with the patient and they are unchanged from previous note.  ALLERGIES:  is allergic to codeine.  MEDICATIONS:  Current Outpatient Medications  Medication Sig Dispense Refill   acetaminophen  (TYLENOL ) 500 MG tablet Take 1,000 mg by mouth every 6 (six) hours as needed for moderate pain.     albuterol  (VENTOLIN  HFA) 108 (90 Base) MCG/ACT inhaler Inhale 1-2 puffs into the lungs every 4 (four) hours as needed for wheezing or shortness of breath. 8 g 0   amLODipine  (NORVASC ) 5 MG tablet Take 1 tablet (5 mg total) by mouth daily. 90 tablet 3   ammonium lactate (LAC-HYDRIN) 12 % lotion Apply 1 Application topically daily.     atorvastatin  (LIPITOR) 80 MG tablet TAKE ONE TABLET BY MOUTH DAILY 90 tablet 1   ezetimibe  (ZETIA ) 10 MG tablet TAKE 1 TABLET BY MOUTH DAILY 90 tablet 3  famotidine  (PEPCID ) 20 MG tablet Take 1 tablet (20 mg total) by mouth 2 (two) times daily as needed for heartburn or indigestion. 60 tablet 1   fluticasone  (FLONASE ) 50 MCG/ACT nasal spray Place 2 sprays into both nostrils as needed for allergies or rhinitis.     ivermectin (STROMECTOL) 3 MG TABS tablet Take 12 mg by mouth once.     lidocaine -prilocaine  (EMLA ) cream Apply to affected area once 30 g 3   nitroGLYCERIN  (NITROSTAT ) 0.4 MG SL tablet Place 1 tablet (0.4 mg total) under the tongue every 5 (five) minutes as needed for chest pain. 20 tablet 12   PARoxetine  (PAXIL )  20 MG tablet Take 10 mg by mouth daily.     SUMAtriptan (IMITREX) 25 MG tablet Take 25 mg by mouth every 2 (two) hours as needed for migraine.     vedolizumab  (ENTYVIO ) 300 MG injection Inject 300 mg into the vein every 8 (eight) weeks.     vedolizumab  (ENTYVIO ) 300 MG injection Inject 300 mg into the vein as directed.     No current facility-administered medications for this visit.   Facility-Administered Medications Ordered in Other Visits  Medication Dose Route Frequency Provider Last Rate Last Admin   0.9 %  sodium chloride  infusion   Intravenous Continuous Lanny Callander, MD 10 mL/hr at 10/19/24 1109 New Bag at 10/19/24 1109   CARBOplatin  (PARAPLATIN ) 340 mg in sodium chloride  0.9 % 100 mL chemo infusion  340 mg Intravenous Once Lanny Callander, MD       PACLitaxel  (TAXOL ) 150 mg in sodium chloride  0.9 % 250 mL chemo infusion (</= 80mg /m2)  80 mg/m2 (Treatment Plan Recorded) Intravenous Once Lanny Callander, MD 275 mL/hr at 10/19/24 1256 150 mg at 10/19/24 1256    PHYSICAL EXAMINATION: ECOG PERFORMANCE STATUS: 2 - Symptomatic, <50% confined to bed  Vitals:   10/19/24 0900  BP: 130/63  Pulse: 86  Resp: 16  Temp: (!) 97 F (36.1 C)  SpO2: 99%   Wt Readings from Last 3 Encounters:  10/19/24 144 lb 11.2 oz (65.6 kg)  10/05/24 148 lb (67.1 kg)  09/28/24 146 lb 6.4 oz (66.4 kg)     GENERAL:alert, no distress and comfortable SKIN: skin color, texture, turgor are normal, no rashes or significant lesions EYES: normal, Conjunctiva are pink and non-injected, sclera clear NECK: supple, thyroid  normal size, non-tender, without nodularity LYMPH:  no palpable lymphadenopathy in the cervical, axillary  LUNGS: clear to auscultation and percussion with normal breathing effort HEART: regular rate & rhythm and no murmurs and no lower extremity edema ABDOMEN:abdomen soft, non-tender and normal bowel sounds Musculoskeletal:no cyanosis of digits and no clubbing  NEURO: alert & oriented x 3 with fluent speech,  no focal motor/sensory deficits  Physical Exam    LABORATORY DATA:  I have reviewed the data as listed    Latest Ref Rng & Units 10/19/2024    8:51 AM 10/05/2024    9:01 AM 09/28/2024    8:19 AM  CBC  WBC 4.0 - 10.5 K/uL 4.8  1.9  2.1   Hemoglobin 12.0 - 15.0 g/dL 8.7  8.4  8.8   Hematocrit 36.0 - 46.0 % 25.7  24.3  25.8   Platelets 150 - 400 K/uL 92  97  69         Latest Ref Rng & Units 10/19/2024    8:51 AM 10/05/2024    9:01 AM 09/28/2024    8:19 AM  CMP  Glucose 70 - 99 mg/dL 896  95  106   BUN 8 - 23 mg/dL 23  22  20    Creatinine 0.44 - 1.00 mg/dL 9.28  9.29  9.39   Sodium 135 - 145 mmol/L 139  140  138   Potassium 3.5 - 5.1 mmol/L 4.0  4.0  4.1   Chloride 98 - 111 mmol/L 107  106  106   CO2 22 - 32 mmol/L 22  22  25    Calcium  8.9 - 10.3 mg/dL 9.3  9.3  9.3   Total Protein 6.5 - 8.1 g/dL 6.7  6.6  6.4   Total Bilirubin 0.0 - 1.2 mg/dL 0.4  0.5  0.5   Alkaline Phos 38 - 126 U/L 91  72  84   AST 15 - 41 U/L 22  25  29    ALT 0 - 44 U/L 17  20  23        RADIOGRAPHIC STUDIES: I have personally reviewed the radiological images as listed and agreed with the findings in the report. No results found.    Orders Placed This Encounter  Procedures   CBC with Differential (Cancer Center Only)    Standing Status:   Future    Expected Date:   12/14/2024    Expiration Date:   12/14/2025   CMP (Cancer Center only)    Standing Status:   Future    Expected Date:   12/14/2024    Expiration Date:   12/14/2025   T4    Standing Status:   Future    Expected Date:   12/14/2024    Expiration Date:   12/14/2025   TSH    Standing Status:   Future    Expected Date:   12/14/2024    Expiration Date:   12/14/2025   CBC with Differential (Cancer Center Only)    Standing Status:   Future    Expected Date:   12/28/2024    Expiration Date:   12/28/2025   CMP (Cancer Center only)    Standing Status:   Future    Expected Date:   12/28/2024    Expiration Date:   12/28/2025   CBC with Differential  (Cancer Center Only)    Standing Status:   Future    Expected Date:   03/08/2025    Expiration Date:   03/08/2026   CMP (Cancer Center only)    Standing Status:   Future    Expected Date:   03/08/2025    Expiration Date:   03/08/2026   T4    Standing Status:   Future    Expected Date:   03/08/2025    Expiration Date:   03/08/2026   TSH    Standing Status:   Future    Expected Date:   03/08/2025    Expiration Date:   03/08/2026   CBC with Differential (Cancer Center Only)    Standing Status:   Future    Expected Date:   04/05/2025    Expiration Date:   04/05/2026   CMP (Cancer Center only)    Standing Status:   Future    Expected Date:   04/05/2025    Expiration Date:   04/05/2026   CBC with Differential (Cancer Center Only)    Standing Status:   Future    Expected Date:   05/03/2025    Expiration Date:   05/03/2026   CMP (Cancer Center only)    Standing Status:   Future    Expected Date:   05/03/2025    Expiration  Date:   05/03/2026   CBC with Differential (Cancer Center Only)    Standing Status:   Future    Expected Date:   05/31/2025    Expiration Date:   05/31/2026   CMP (Cancer Center only)    Standing Status:   Future    Expected Date:   05/31/2025    Expiration Date:   05/31/2026   T4    Standing Status:   Future    Expected Date:   05/31/2025    Expiration Date:   05/31/2026   TSH    Standing Status:   Future    Expected Date:   05/31/2025    Expiration Date:   05/31/2026   CBC with Differential (Cancer Center Only)    Standing Status:   Future    Expected Date:   06/28/2025    Expiration Date:   06/28/2026   CMP (Cancer Center only)    Standing Status:   Future    Expected Date:   06/28/2025    Expiration Date:   06/28/2026   T4    Standing Status:   Future    Expected Date:   06/28/2025    Expiration Date:   06/28/2026   TSH    Standing Status:   Future    Expected Date:   06/28/2025    Expiration Date:   06/28/2026   CBC with Differential (Cancer Center Only)    Standing  Status:   Future    Expected Date:   01/11/2025    Expiration Date:   01/11/2026   CMP (Cancer Center only)    Standing Status:   Future    Expected Date:   01/11/2025    Expiration Date:   01/11/2026   CBC with Differential (Cancer Center Only)    Standing Status:   Future    Expected Date:   02/08/2025    Expiration Date:   02/08/2026   CMP (Cancer Center only)    Standing Status:   Future    Expected Date:   02/08/2025    Expiration Date:   02/08/2026   All questions were answered. The patient knows to call the clinic with any problems, questions or concerns. No barriers to learning was detected. The total time spent in the appointment was 40 minutes, including review of chart and various tests results, discussions about plan of care and coordination of care plan     Onita Mattock, MD 10/19/2024     "

## 2024-10-19 NOTE — Assessment & Plan Note (Signed)
 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  -PET Jan 25, 2024 showed long segment anorectal hypermetabolic activity, and hypermetabolic right inguinal and external iliac lymph nodes metastasis.  No distant metastasis. -she started concurrent chemoradiation with 5-FU and mitomycin  on Jan 31, 2024. -she was hospitalized for pericarditis on 02/02/2024, clinically suspicious for 5-fu induced coronary artery spasm. Chemo held on day 4 -She was admitted to hospital for cycle 2 chemotherapy with cardiology on board, she still developed a significant recurrent chest pain and 5-FU infusion was stopped on day 4 -RT completed on 03/16/24 -PET 06/12/2024 showed resolved hypermetabolic activity in the anus and right inguinal and pelvic lymph nodes, however she has mildly progressive hypermetabolic retroperitoneal and new left [place] adenopathy. Biopsy of the left [place] node confirmed metastatic anal cancer.  -I recommend chemo carbo taxol  and immunotherapy zynyz , she started on 07/27/2024 -PET 10/12/2024 showed complete response, will continue treatment

## 2024-10-19 NOTE — Patient Instructions (Addendum)
 [facility] - A DEPT OF [facility]  Discharge Instructions: Thank you for choosing [facility] to provide your oncology and hematology care.   If you have a lab appointment with the Cancer Center, please go directly to the Cancer Center and check in at the registration area.   Wear comfortable clothing and clothing appropriate for easy access to any Portacath or PICC line.   We strive to give you quality time with your provider. You may need to reschedule your appointment if you arrive late (15 or more minutes).  Arriving late affects you and other patients whose appointments are after yours.  Also, if you miss three or more appointments without notifying the office, you may be dismissed from the clinic at the providers discretion.      For prescription refill requests, have your pharmacy contact our office and allow 72 hours for refills to be completed.    Today you received the following chemotherapy and/or immunotherapy agents: Retifanlimab -dlwr (Zynyz ), Paclitaxel  (Taxol ), & Carboplatin  (Paraplatin )      To help prevent nausea and vomiting after your treatment, we encourage you to take your nausea medication as directed.  BELOW ARE SYMPTOMS THAT SHOULD BE REPORTED IMMEDIATELY: *FEVER GREATER THAN 100.4 F (38 C) OR HIGHER *CHILLS OR SWEATING *NAUSEA AND VOMITING THAT IS NOT CONTROLLED WITH YOUR NAUSEA MEDICATION *UNUSUAL SHORTNESS OF BREATH *UNUSUAL BRUISING OR BLEEDING *URINARY PROBLEMS (pain or burning when urinating, or frequent urination) *BOWEL PROBLEMS (unusual diarrhea, constipation, pain near the anus) TENDERNESS IN MOUTH AND THROAT WITH OR WITHOUT PRESENCE OF ULCERS (sore throat, sores in mouth, or a toothache) UNUSUAL RASH, SWELLING OR PAIN  UNUSUAL VAGINAL DISCHARGE OR ITCHING   Items with * indicate a potential emergency and should be followed up as soon as possible or go to the Emergency Department if any problems should  occur.  Please show the CHEMOTHERAPY ALERT CARD or IMMUNOTHERAPY ALERT CARD at check-in to the Emergency Department and triage nurse.  Should you have questions after your visit or need to cancel or reschedule your appointment, please contact [facility] - A DEPT [facility]  Dept: [phone]  and follow the prompts.  Office hours are 8:00 a.m. to 4:30 p.m. Monday - Friday. Please note that voicemails left after 4:00 p.m. may not be returned until the following business day.  We are closed weekends and major holidays. You have access to a nurse at all times for urgent questions. Please call the main number to the clinic Dept: [phone] and follow the prompts.   For any non-urgent questions, you may also contact your provider using MyChart. We now offer e-Visits for anyone 60 and older to request care online for non-urgent symptoms. For details visit [email or web address].   Also download the MyChart app! Go to the app store, search MyChart, open the app, select [place], and log in with your MyChart username and password.

## 2024-10-19 NOTE — Progress Notes (Signed)
 Confirmed with Devan Mitchell, RPH that pt does not need to wait 30 minutes after premeds to start retifanlimab -dlwr (Zynyz ).

## 2024-10-23 ENCOUNTER — Encounter (HOSPITAL_COMMUNITY): Payer: Self-pay

## 2024-10-24 ENCOUNTER — Other Ambulatory Visit: Payer: Self-pay

## 2024-10-25 NOTE — Progress Notes (Addendum)
 "  PROVIDER:  LONNI OZELL PIZZA, MD  MRN: [id number] DOB: August 04, 1956 DATE OF ENCOUNTER: 10/25/2024  Subjective   Chief Complaint: Anal cancer, follow-up  History of Present Illness: Andrea [name] is a 69 y.o. female with history of UC (follows with Dr. Legrand) - she was referred to see us  for some anal irritation/white like skin changes noted on examination in the office with them back 11/2021.  She was first diagnosed with ulcerative colitis back in March 2005.  Colonoscopy 06/2020 showed a small 4 mm rectal hyperplastic polyp and sigmoid diverticulosis.  She was noted to be stable on infliximab  as well as her low-dose azathioprine.  She was seen in their office back in March with a several week history of perianal irritation when she has bowel movements.  She was taking a stool softener at that time.  She describes an itching/burning sensation.  She has tried a variety of over-the-counter topical ointments including Neosporin, Vaseline, Preparation H, Aquaphor.  She reports that she first noticed all the symptoms somewhere back in December/January and now having had them for 5 to 6 months.  They have not gotten better nor worse.  Nothing seems to be progressive at this juncture.  She denies any issue with incontinence to gas, liquid, or solid stool.  OR 05/04/22 - Transanal excision of posterior midline anal canal lesion under endoscopic guidance Punch biopsy x2 of perianal skin, left lateral Anorectal exam under anesthesia  OR FINDINGS: Posterior midline white plaque anal lesion 1 x 1 cm in size, visualized under anoscopy - fully excised. Very sharp margin area of redness at junction of the anoderm and anal margin. This was punch biopsied x 2 including portion of normal skin, submitted as separate specimen from posterior midline tissue. Small internal hemorrhoids.  PATH: A. ANAL LESION, POSTERIOR MIDLINE, EXCISION:  -  Superficially invasive squamous cell carcinoma in the background of   extensive high-grade squamous intraepithelial lesion/anal  intraepithelial neoplasia 3 of 3 (HGSIL/AIN 3)   B. SKIN, ANAL MARGIN, LEFT LATERAL, BIOPSY:  -  High-grade squamous intraepithelial lesion/anal intraepithelial  neoplasia 3 of 3 (HGSIL/AIN 3).  Skin lesion/skin cancer removed from her chest.  She met back with her gastroenterologist in [facility].  She indicates that they believe this could be related to side effects of Remicade  and or changing her medication regimen.  No other complaints at present.  No lumps bumps or issues with the perianal area.  She did complete treatment with Aldara.  Cscope 01/21/23: - Normal examined ileum - Normal mucosa in entire examined colon - 2 diminutive polyps were removed - Diverticulosis in the entire examined colon - External and internal hemorrhoids - Exam otherwise normal - Several random biopsies were obtained - Colitis remains in complete mucosal remission  PATH -Tubular adenoma without dysplasia; all random biopsies demonstrate mucosa without diagnostic alteration   Initially lost for follow-up but returned for follow-up.  She reports she has had for at least the last 3 - 4 months some perianal itching and occasional tiny drops of blood when wiping.  She has follow-up with GI whom had instructed her to return here to be seen given her history.  She has been switched from Remicade  to Entyvio .  Otherwise, she denies any changes in her health or health history.  She is not currently taking any blood thinners.  She denies any history of heart attack or stroke.   OR 01/14/24 -perianal lesion punch biopsy x 4, EUA   OR FINDINGS: Posteriorly based  anal lesion, friable, glistening/pink (see photo), sharp demarcation. Extends into anal canal to the level of dentate. 2 firm areas - right posterior and left posterior - punch biopsies obtained to rule out underlying squamous cell carcinoma.   Path: A. PERIANAL, RIGHT POSTERIOR, BIOPSY:        Invasive squamous cell carcinoma, moderately differentiated.       Suspicious for lymphovascular invasion.   B. PERIANAL, LEFT POSTERIOR, BIOPSY:       High-grade squamous intraepithelial lesion (HSIL / AIN 3).       No evidence of definitive invasion.   CT PET 01/25/24 -  1. Long segment anorectal hypermetabolic activity consistent with known primary malignancy. 2. Hypermetabolic right inguinal and external iliac lymph nodes consistent with nodal metastases. 3. No evidence of distant metastatic disease. 4.  Aortic Atherosclerosis (ICD10-I70.0).  Case presented at [facility].   Consensus recommendation was for chemoradiation therapy.  Had PICC line placed and began therapy.  She subsequent developed Myo versus pericarditis.  She had presented to hospital for this 02/02/2024.  She was discharged 02/04/2024.  Got over pericarditis - has been off the infusional chemo but continued her XRT. Started 01/31/24 with planned 5.5 wk course. No new perianal pains or bleeding. Has recovered well from punch biopsy perspective  Completed chemoradiation for anal cancer with approximately 80% of the planned chemotherapy dose due to coronary artery spasm.  Started pelvic floor PT.  Restaging PET ordered with Dr. Lanny for 06/12/24 with office visit 06/2024.  PET 06/12/24 1. Interval resolution of previously demonstrated anorectal hypermetabolic activity consistent with response to therapy. 2. Interval resolution of previously demonstrated hypermetabolic right inguinal and right pelvic lymph nodes. 3. Mildly progressive hypermetabolic retroperitoneal adenopathy in the low abdomen and false pelvis, suspicious for metastatic disease. New small left supraclavicular lymph node with low level metabolic activity, indeterminate. 4. No evidence of extranodal metastatic disease. 5.  Aortic Atherosclerosis (ICD10-I70.0).  INTERVAL HX L supraclavicular LN bx 06/2024      1. Lymph node, biopsy, L supraclavicular cervical :        METASTATIC POORLY DIFFERENTIATED SQUAMOUS CELL CARCINOMA COMPATIBLE WITH ANAL       PRIMARY   CT PET 10/12/24 - NED - resolution of hypermetabolic left para-aortic LN.   She sees me today in follow-up.  Undergoing treatment with Dr. Lanny for the above.  After 6 cycles of chemotherapy, planning continuing immunotherapy with PD-1 inhibitor every month for up to 2 years.  Has noted some issues recently with constipation since starting chemotherapy.  She will occasionally take psyllium versus MiraLAX as needed but is not on anything around-the-clock or prophylactically.  Reports that her stools will be firm/hard and sometimes left to strain.  With this, she will have significant pain with bowel movements when her stools are particularly hard and will have a knifelike sensation with some associated bleeding.   PMH: UC (on Entyvio  now with Dr. Legrand)  PSH: As above, otherwise, denies any prior anorectal procedures or surgeries  FHx: Denies any known family history of colorectal, breast, endometrial or ovarian cancer  Social Hx: Denies use of tobacco/EtOH/illicit drug.  She is happily retired.  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  Helps care for a special needs grandchild who weighs approximately 40 pounds   Medical History: Past Medical History:  Diagnosis Date   Blood in urine    Chest discomfort    history of    Chest pressure    Hyperlipidemia  Hypertension    Iron deficiency    borderline    Nervous stomach    history of    Nocturia    ULCERATIVE COLITIS    Ulcerative colitis (CMS/HHS-HCC)    Unspecified vitamin D deficiency    Weight gain     Patient Active Problem List  Diagnosis   Nervous stomach   Nocturia   Essential hypertension   Vitamin D deficiency   Hyperlipidemia   Blood in urine   Ulcerative colitis (CMS/HHS-HCC)   Weight loss   Seasonal allergies   Encounter for long-term (current) use of medications    Benign breast lumps   Acute anxiety   Iron deficiency anemia   Heartburn   H. pylori infection   Elevated fasting glucose   CRI (chronic renal insufficiency), unspecified stage   Hypercholesterolemia   Pain of both hip joints   Seasonal allergic rhinitis   Incomplete tear of rotator cuff   Oral thrush    Past Surgical History:  Procedure Laterality Date   JOINT REPLACEMENT Right 03/2016   hip replacement   3 children through vaginal deliveries and one miscarriage     4/12 patient fall trauma to right lateral chest wall, right knee with swelling     narrow oropharynx     OTHER SURGERY     Hip replacement   status post bilateral tubal ligation       Allergies  Allergen Reactions   Codeine Vomiting    Current Outpatient Medications on File Prior to Visit  Medication Sig Dispense Refill   acetaminophen  (TYLENOL ) 500 MG tablet Take 1,000 mg by mouth 3 (three) times daily as needed for Pain.     atorvastatin  (LIPITOR) 10 MG tablet TAKE 1 TABLET BY MOUTH ONCE DAILY 30 tablet 0   azaTHIOprine (IMURAN) 50 mg tablet TAKE 2 TABLETS BY MOUTH EVERY DAY 180 tablet 3   calcium  citrate-vitamin D3 250-200 mg-unit Tab Take 1 tablet by mouth once daily.      cholecalciferol (VITAMIN D3) 2,000 unit tablet Take 4,000 Units by mouth once daily.      co-enzyme Q-10, ubiquinone, 200 mg capsule Take 200 mg by mouth once daily.     Herbal Supplement 1 tablet once daily. Turmeric + black pepper       inFLIXimab  (REMICADE ) 100 mg injection REMICADE  IV EVERY 8 WEEKS     lidocaine  (LIDOCAINE ) 2 % solution 5 ml gargle and spit q 6 hours prn for mouth soreness 150 mL 0   MV,CA,MIN/IRON/FA/GUARANA/CAFF (ONE-A-DAY WOMEN'S ACTIVE ORAL) Take 1 tablet by mouth once daily.     PARoxetine  (PAXIL ) 20 MG tablet TAKE 1 TABLET(20 MG) BY MOUTH EVERY DAY 30 tablet 5   predniSONE  (DELTASONE ) 10 MG tablet 30 mg po qam x 5days          20 mg po qam x 5days          10 mg po qam x10 days  (Patient not taking: Reported on 09/22/2016) 35 tablet 0   triamterene -hydrochlorothiazide  (DYAZIDE ) 37.5-25 mg capsule TAKE 1 CAPSULE BY MOUTH ONCE DAILY 30 capsule 0   No current facility-administered medications on file prior to visit.    Family History  Problem Relation Age of Onset   Coronary Artery Disease (Blocked arteries around heart) Mother    Heart disease Mother    Coronary Artery Disease (Blocked arteries around heart) Brother    Heart disease Brother      Social History   Tobacco Use  Smoking  Status Never  Smokeless Tobacco Never     Social History   Socioeconomic History   Marital status: Divorced  Tobacco Use   Smoking status: Never   Smokeless tobacco: Never  Vaping Use   Vaping status: Unknown  Substance and Sexual Activity   Alcohol use: No   Drug use: No   Sexual activity: Not Currently   Social Drivers of Health   Financial Resource Strain: Low Risk (07/18/2024)   Received [facility]   Overall Financial Resource Strain (CARDIA)    How hard is it for you to pay for the very basics like food, housing, medical care, and heating?: Not hard at all  Food Insecurity: No Food Insecurity (07/18/2024)   Received from [facility]   Hunger Vital Sign    Within the past 12 months, you worried that your food would run out before you got the money to buy more.: Never true    Within the past 12 months, the food you bought just didn't last and you didn't have money to get more.: Never true  Transportation Needs: No Transportation Needs (07/18/2024)   Received from [facility]   PRAPARE - Transportation    Lack of Transportation (Medical): No    Lack of Transportation (Non-Medical): No  Social Connections: Moderately Integrated (02/28/2024)   Received from [facility]   Social Connection and Isolation Panel    In a typical week, how many times do you talk on the phone with family, friends, or neighbors?: More than three times a  week    How often do you get together with friends or relatives?: More than three times a week    How often do you attend church or religious services?: More than 4 times per year    Do you belong to any clubs or organizations such as church groups, unions, fraternal or athletic groups, or school groups?: Yes    How often do you attend meetings of the clubs or organizations you belong to?: More than 4 times per year    Are you married, widowed, divorced, separated, never married, or living with a partner?: Divorced  Housing Stability: Low Risk (07/18/2024)   Received from [facility]   Housing    Within the past 12 months, have you ever stayed: outside, in a car, in a tent, in an overnight shelter, or temporarily in someone else's home(i.e.couch-surfing)?: No    Are you worried about losing your housing?: No    Objective:    Vitals:   10/25/24 1510  PainSc: 0-No pain     There is no height or weight on file to calculate BMI.  Constitutional: NAD; conversant Eyes: Moist conjunctiva; anicteric Lungs: Normal respiratory effort CV: RRR Anorectal: Posteriorly, small shallow fissure. No mass or ulceration in anal margin.  DRE-normal tone; no significant stricturing but is still sensitive.  Smooth anal canal.  No palpable masses. Anoscopy: Circumferential anoscopy demonstrates healthy appearing anoderm with shallow posterior midline fissure. No ulceration/punched out appearance. MSK: Normal range of motion of extremities Psychiatric: Appropriate affect; alert and oriented x3  Procedure: Anoscopy We spent time reviewing the procedure, material risks (including but not limited to perianal pain, bleeding) benefits and alternatives.  Witnessed, informed consent was obtained.  Narrative:  The lubricated anoscope was gently inserted into the anal canal. Circumferential anoscopy demonstrates findings noted above  A chaperone, Chemira Jones CMA, was present for this entire  examination  Assessment and Plan:  Diagnoses and all  orders for this visit:  Anal cancer (CMS/HHS-HCC)    Andrea [name] is a very pleasant 69 y.o. female with hx of longstanding UC with hx of excised HGSIL perianally but lost to follow-up; developed evident recurrence   OR 01/14/24 - punch biopsy x4 perianal lesion, EUA PATH -invasive squamous cell carcinoma CT PET demonstrates evident nodal metastases in both the inguinal and external iliac chain  MDT --> consensus for cXRT  Pericarditis vs coronary artery spasm presumably 2/2 chemotherapy? - following with Dr. Lanny  XRT 01/31/24 --> 5.5 wks -received approximately 80% of the prescribed chemotherapy secondary to coronary artery spasm - completed 03/16/24  - Doing well - Posterior midline portion of the anal canal and anal margin have been problematic previously and on exam there is no mass but she does have a shallow fissure at this location.  She also notes that her stools have actually been constipated since starting chemotherapy.  She is taking MiraLAX occasionally/reactively when her stools are particularly firm and hard.  She is not taking anything prophylactically at present. -The area in the posterior midline does appear like a shallow classic anal fissure and given her history of hard stools, think this is certainly explainable as well.  Her PET scan is also showing no disease activity this location.  For now, she is on systemic chemotherapy for supraclavicular nodal metastasis and I think it would be reasonable to continue to observe everything.  I have discussed increasing her MiraLAX to taking it daily and finding a dose that works well for her to keep her stools reliably soft I think this will help a lot.  PET 06/12/24 -resolution of the hypermetabolic activity previously seen in the anal canal.  Resolution of hypermetabolic right inguinal and pelvic lymph nodes. - Mildly progressive hypermetabolic retroperitoneal adenopathy in the  lower abdomen and pelvis, suspicious for metastatic disease.  New small left supraclavicular lymph node with low metabolic activity, indeterminate. L supraclavic LN bx 06/2024 -metastatic poorly differentiated squamous carcinoma  Started on chemotherapy with 6 cycles plan followed by continued immunotherapy for up to 2 years-PD-1 inhibitor.  She notes that she believes she will complete treatment systemically in May/June  Will tentatively plan to see her back for follow-up in 3 months or sooner if any other issues or concerns arise as she is also following closely with Dr. Lanny.  She has been encouraged to call with any questions or concerns in the interim.   Return in about 3 months (around 01/22/2025).  I spent a total of 30 minutes in both face-to-face and non-face-to-face activities, excluding procedures performed, for this visit on the date of this encounter.   Lonni Pizza, MD [facility]  "

## 2024-10-26 ENCOUNTER — Inpatient Hospital Stay

## 2024-10-27 ENCOUNTER — Ambulatory Visit (HOSPITAL_COMMUNITY): Admission: RE | Admit: 2024-10-27 | Source: Ambulatory Visit | Admitting: Cardiology

## 2024-10-27 ENCOUNTER — Ambulatory Visit (HOSPITAL_COMMUNITY): Admission: RE | Admit: 2024-10-27 | Source: Ambulatory Visit

## 2024-10-27 ENCOUNTER — Encounter (HOSPITAL_COMMUNITY): Payer: Self-pay

## 2024-10-27 ENCOUNTER — Ambulatory Visit (HOSPITAL_COMMUNITY): Admitting: Cardiology

## 2024-10-27 ENCOUNTER — Other Ambulatory Visit (HOSPITAL_COMMUNITY): Payer: Self-pay | Admitting: Cardiology

## 2024-10-27 VITALS — BP 140/60 | HR 87 | Wt 148.2 lb

## 2024-10-27 DIAGNOSIS — R079 Chest pain, unspecified: Secondary | ICD-10-CM

## 2024-10-27 DIAGNOSIS — R002 Palpitations: Secondary | ICD-10-CM

## 2024-10-27 DIAGNOSIS — I509 Heart failure, unspecified: Secondary | ICD-10-CM

## 2024-10-27 MED ORDER — CARVEDILOL 3.125 MG PO TABS: 3.1250 mg | ORAL_TABLET | Freq: Two times a day (BID) | ORAL | 3 refills | Status: AC

## 2024-10-27 NOTE — Progress Notes (Incomplete)
" ° °  ADVANCED HEART FAILURE FOLLOW UP CLINIC NOTE  Referring Physician: Marvene Prentice SAUNDERS, FNP  Primary Care: Marvene Prentice SAUNDERS, FNP Primary Cardiologist:  HPI: Andrea [name] is a 69 y.o. female who presents for follow up of coronary vasospasm.       Oncology History  Anal squamous cell carcinoma (HCC)  12/29/2023 Pathology Results   A. PERIANAL, RIGHT POSTERIOR, BIOPSY:       Invasive squamous cell carcinoma, moderately differentiated. Suspicious for lymphovascular invasion.  B. PERIANAL, LEFT POSTERIOR, BIOPSY:       High-grade squamous intraepithelial lesion (HSIL / AIN 3).  No evidence of definitive invasion.    01/20/2024 Initial Diagnosis   Anal cancer (HCC)   01/26/2024 Cancer Staging   Staging form: Anus, AJCC 8th Edition - Clinical: Stage IIIA (cT2, cN1c, cM0) - Signed by Lanny Callander, MD on 01/26/2024   01/31/2024 - 02/28/2024 Chemotherapy   Patient is on Treatment Plan : ANUS Mitomycin  D1,28 + 5FU D1-4, 28-31 q32d     07/27/2024 -  Chemotherapy   Patient is on Treatment Plan : ANUS Retifanlimab  + Paclitaxel  (80) + Carboplatin  (5) / Retifanlimab       Patient with admission for chest pain while on 5-FU infusion. Workup showed elevated troponin, EKG changes, but by the time cardiac catheterization was completed her EKG changes had resolved and was feeling better, normal coronaries.   Was started on intensive premedication and underwent continuous 5-FU infusion while hospitalized.  She did require IV nitroglycerin  drip and developed chest pain that eventually limited to total dose of therapy, but did receive about 80% of the infusion.     SUBJECTIVE:  Overall doing better, she is off her previously ordered blood pressure therapy as well as prophylaxis for coronary vasospasm.  She does report that her blood pressure has been more elevated at most of her recent appointments.  Finished up radiation therapy, planning to meet back with oncology soon to discuss further treatment plan.   Having some issues with neuropathy from the chemotherapy.  PMH, current medications, allergies, social history, and family history reviewed in epic.  PHYSICAL EXAM: Vitals:   10/27/24 1512  BP: (!) 140/60  Pulse: 87  SpO2: 98%   GENERAL: Well nourished and in no apparent distress at rest.  PULM:  Normal work of breathing, clear to auscultation bilaterally. Respirations are unlabored.  CARDIAC:  JVP: Flat         Normal rate with regular rhythm. No murmurs, rubs or gallops.  Trace edema. Warm and well perfused extremities. ABDOMEN: Soft, non-tender, non-distended. NEUROLOGIC: Patient is oriented x3 with no focal or lateralizing neurologic deficits.    DATA REVIEW    ECHO: 02/03/2024: LVEF 55-60, normal RV function  CATH: 02/03/2024: Mild LAD and RCA disease, otherwise normal coronary arteries  ASSESSMENT & PLAN:  Coronary vasospasm: In the setting of 5FU infusion, did not tolerate full therapy, but was extremely high risk on continuous infusion and received the majority of therapy. Occasional twinges with exertion since that time, but no chest pressure. Will restart BP control. - Start amlodipine  5mg  daily - Likely ARB/ACEi at PCP appointment - Consider PET scan in the future if persistent  Hypertension: Elevated today. - Restart BP meds as above  Anal cancer: cT2N1M0, follows with Dr. Lanny.  - PET scan pending - Surveillance phase currently  Follow up in 3-6 months  Morene Brownie, MD Advanced Heart Failure Mechanical Circulatory Support 10/27/24 "

## 2024-10-27 NOTE — Patient Instructions (Signed)
 START Carvedilol  3.125 mg Twice daily  Your physician has requested that you have an echocardiogram. Echocardiography is a painless test that uses sound waves to create images of your heart. It provides your doctor with information about the size and shape of your heart and how well your hearts chambers and valves are working. This procedure takes approximately one hour. There are no restrictions for this procedure. Please do NOT wear cologne, perfume, aftershave, or lotions (deodorant is allowed). Please arrive 15 minutes prior to your appointment time.  Please note: We ask at that you not bring children with you during ultrasound (echo/ vascular) testing. Due to room size and safety concerns, children are not allowed in the ultrasound rooms during exams. Our front office staff cannot provide observation of children in our lobby area while testing is being conducted. An adult accompanying a patient to their appointment will only be allowed in the ultrasound room at the discretion of the ultrasound technician under special circumstances. We apologize for any inconvenience.  Your provider has recommended that  you wear a Zio Patch for 14 days.  This monitor will record your heart rhythm for our review.  IF you have any symptoms while wearing the monitor please press the button.  If you have any issues with the patch or you notice a red or orange light on it please call the company at [phone].  Once you remove the patch please mail it back to the company as soon as possible so we can get the results.  Your physician recommends that you schedule a follow-up appointment in: 3 months.  If you have any questions or concerns before your next appointment please send us  a message through [place] or call our office at [phone].    TO LEAVE A MESSAGE FOR THE NURSE SELECT OPTION 2, PLEASE LEAVE A MESSAGE INCLUDING: YOUR NAME DATE OF BIRTH CALL BACK NUMBER REASON FOR CALL**this is important as we  prioritize the call backs  YOU WILL RECEIVE A CALL BACK THE SAME DAY AS LONG AS YOU CALL BEFORE 4:00 PM  At the [facility], you and your health needs are our priority. As part of our continuing mission to provide you with exceptional heart care, we have created designated Provider Care Teams. These Care Teams include your primary Cardiologist (physician) and Advanced Practice Providers (APPs- Physician Assistants and Nurse Practitioners) who all work together to provide you with the care you need, when you need it.   You may see any of the following providers on your designated Care Team at your next follow up: Dr Toribio Fuel Dr Ezra Shuck Dr. Morene Brownie Greig Mosses, NP Caffie Shed, [place] [facility] [place], [place] Beckey Coe, NP Jordan Lee, NP Ellouise Class, NP Tinnie Redman, PharmD Jaun Bash, PharmD   Please be sure to bring in all your medications bottles to every appointment.    Thank you for choosing [facility]

## 2024-10-31 ENCOUNTER — Ambulatory Visit

## 2024-11-02 ENCOUNTER — Inpatient Hospital Stay: Admitting: Nurse Practitioner

## 2024-11-02 ENCOUNTER — Inpatient Hospital Stay

## 2024-11-02 ENCOUNTER — Inpatient Hospital Stay: Attending: Hematology

## 2024-11-04 ENCOUNTER — Inpatient Hospital Stay

## 2024-11-16 ENCOUNTER — Inpatient Hospital Stay

## 2024-11-16 ENCOUNTER — Inpatient Hospital Stay: Admitting: Hematology

## 2024-11-23 ENCOUNTER — Inpatient Hospital Stay: Admitting: Nurse Practitioner

## 2024-11-23 ENCOUNTER — Inpatient Hospital Stay: Attending: Hematology

## 2024-11-23 ENCOUNTER — Inpatient Hospital Stay

## 2024-11-30 ENCOUNTER — Inpatient Hospital Stay

## 2024-11-30 ENCOUNTER — Inpatient Hospital Stay: Admitting: Hematology

## 2024-11-30 ENCOUNTER — Inpatient Hospital Stay: Attending: Hematology

## 2024-12-02 ENCOUNTER — Inpatient Hospital Stay

## 2024-12-14 ENCOUNTER — Inpatient Hospital Stay: Admitting: Hematology

## 2024-12-14 ENCOUNTER — Inpatient Hospital Stay

## 2025-01-24 ENCOUNTER — Other Ambulatory Visit (HOSPITAL_COMMUNITY)

## 2025-01-24 ENCOUNTER — Ambulatory Visit (HOSPITAL_COMMUNITY): Admitting: Cardiology

## 2025-02-01 ENCOUNTER — Ambulatory Visit: Admitting: Cardiology

## 2025-02-07 ENCOUNTER — Ambulatory Visit: Admitting: Cardiology
